# Patient Record
Sex: Male | Born: 1945
Health system: Southern US, Community
[De-identification: ages and names within clinical notes are randomized; demographics above are authoritative.]

## PROBLEM LIST (undated history)

## (undated) DIAGNOSIS — M1990 Unspecified osteoarthritis, unspecified site: Secondary | ICD-10-CM

## (undated) DIAGNOSIS — Z87442 Personal history of urinary calculi: Secondary | ICD-10-CM

## (undated) DIAGNOSIS — N200 Calculus of kidney: Secondary | ICD-10-CM

## (undated) DIAGNOSIS — K219 Gastro-esophageal reflux disease without esophagitis: Secondary | ICD-10-CM

## (undated) DIAGNOSIS — Z860100 Personal history of colon polyps, unspecified: Secondary | ICD-10-CM

## (undated) DIAGNOSIS — E7800 Pure hypercholesterolemia, unspecified: Secondary | ICD-10-CM

## (undated) DIAGNOSIS — I499 Cardiac arrhythmia, unspecified: Secondary | ICD-10-CM

## (undated) DIAGNOSIS — R011 Cardiac murmur, unspecified: Secondary | ICD-10-CM

## (undated) DIAGNOSIS — I2510 Atherosclerotic heart disease of native coronary artery without angina pectoris: Secondary | ICD-10-CM

## (undated) DIAGNOSIS — F419 Anxiety disorder, unspecified: Secondary | ICD-10-CM

## (undated) DIAGNOSIS — J189 Pneumonia, unspecified organism: Secondary | ICD-10-CM

## (undated) DIAGNOSIS — Z800 Family history of malignant neoplasm of digestive organs: Secondary | ICD-10-CM

## (undated) DIAGNOSIS — C801 Malignant (primary) neoplasm, unspecified: Secondary | ICD-10-CM

## (undated) DIAGNOSIS — N189 Chronic kidney disease, unspecified: Secondary | ICD-10-CM

## (undated) DIAGNOSIS — I350 Nonrheumatic aortic (valve) stenosis: Secondary | ICD-10-CM

## (undated) DIAGNOSIS — I480 Paroxysmal atrial fibrillation: Secondary | ICD-10-CM

## (undated) DIAGNOSIS — E119 Type 2 diabetes mellitus without complications: Secondary | ICD-10-CM

## (undated) DIAGNOSIS — I10 Essential (primary) hypertension: Secondary | ICD-10-CM

## (undated) HISTORY — DX: Nonrheumatic aortic (valve) stenosis: I35.0

## (undated) HISTORY — DX: Family history of malignant neoplasm of digestive organs: Z80.0

## (undated) HISTORY — DX: Atherosclerotic heart disease of native coronary artery without angina pectoris: I25.10

## (undated) HISTORY — DX: Personal history of colonic polyps: Z86.010

## (undated) HISTORY — DX: Paroxysmal atrial fibrillation: I48.0

## (undated) HISTORY — DX: Personal history of colon polyps, unspecified: Z86.0100

## (undated) HISTORY — PX: APPENDECTOMY: SHX54

## (undated) HISTORY — PX: OTHER SURGICAL HISTORY: SHX169

---

## 2001-08-13 ENCOUNTER — Ambulatory Visit (HOSPITAL_COMMUNITY): Admission: RE | Admit: 2001-08-13 | Discharge: 2001-08-13 | Payer: Self-pay | Admitting: Pulmonary Disease

## 2007-10-26 ENCOUNTER — Observation Stay (HOSPITAL_COMMUNITY): Admission: EM | Admit: 2007-10-26 | Discharge: 2007-10-28 | Payer: Self-pay | Admitting: Emergency Medicine

## 2009-08-06 ENCOUNTER — Emergency Department (HOSPITAL_COMMUNITY): Admission: EM | Admit: 2009-08-06 | Discharge: 2009-08-06 | Payer: Self-pay | Admitting: Emergency Medicine

## 2010-03-22 LAB — POCT I-STAT, CHEM 8
BUN: 16 mg/dL (ref 6–23)
Calcium, Ion: 1.24 mmol/L (ref 1.12–1.32)
Chloride: 104 mEq/L (ref 96–112)
Creatinine, Ser: 1.1 mg/dL (ref 0.4–1.5)
Glucose, Bld: 155 mg/dL — ABNORMAL HIGH (ref 70–99)
HCT: 47 % (ref 39.0–52.0)
Hemoglobin: 16 g/dL (ref 13.0–17.0)
Potassium: 4.5 mEq/L (ref 3.5–5.1)
Sodium: 139 mEq/L (ref 135–145)
TCO2: 26 mmol/L (ref 0–100)

## 2010-03-22 LAB — GLUCOSE, CAPILLARY: Glucose-Capillary: 147 mg/dL — ABNORMAL HIGH (ref 70–99)
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
## 2010-05-21 NOTE — Group Therapy Note (Signed)
NAMELYALL, FACIANE                 ACCOUNT NO.:  [id number]   MEDICAL RECORD NO.:  [id number]          PATIENT TYPE:  INP   LOCATION:  A340                          FACILITY:  [facility]   PHYSICIAN:  Angus G. Renard Matter, MD   DATE OF BIRTH:  09-22-1945   DATE OF PROCEDURE:  DATE OF DISCHARGE:                                 PROGRESS NOTE   This patient was admitted with anterior chest pain.  He feels some  better.  His cardiac panel remains essentially normal.  He has been seen  by Cardiology.   OBJECTIVE:  VITAL SIGNS:  Blood pressure 147/75, respirations 18, pulse  68, temperature 97.9.  Cardiac markers remain within normal range HEART:  Regular rhythm.  ABDOMEN:  No palpable organs or masses.  LUNGS:  Clear to P&A.   ASSESSMENT:  The patient was admitted with anterior chest pain as a rule  out of coronary artery disease.  Scheduled for a possible stress test by  Cardiology service.      Angus G. Renard Matter, MD  Electronically Signed     AGM/MEDQ  D:  10/28/2007  T:  10/28/2007  Job:  [id number]

## 2010-05-24 NOTE — Procedures (Signed)
   [name], William [name]                             ACCOUNT NO.:  [id number]   MEDICAL RECORD NO.:  [id number]                   PATIENT TYPE:  OUT   LOCATION:  DFTL                                 FACILITY:  [facility]   PHYSICIAN:  Fredirick Maudlin, M.D.              DATE OF BIRTH:  Jun 02, 1945   DATE OF PROCEDURE:  DATE OF DISCHARGE:                                    STRESS TEST   INDICATIONS FOR PROCEDURE:  Hyperlipidemia and diabetes.   SUBJECTIVE:  This patient is undergoing a graded exercise test because of a  history of hyperlipidemia and diabetes and to rule out ischemic cardiac  disease as a complication of these problems.  There are no contraindications  to graded exercise testing.   DESCRIPTION OF PROCEDURE:  This patient exercised for 8 minutes and 30  seconds reaching and sustaining 10.1 METS.  His maximum recorded heart rate  was 143 which is 86% of his age-predicted maximal heart rate.  His blood  pressure response to exercise was normal.  He had no symptoms during  exercise except for fatigue and shortness of breath, no chest pain.  There  were no electrocardiographic changes suggestive of inducible ischemia.   IMPRESSION:  1. Good exercise tolerance.  2. No evidence of inducible ischemia.  3. Normal blood pressure response to exercise.  4. Shortness of breath and fatigue with exercise, but no chest pain.                                               Fredirick Maudlin, M.D.    ELH/MEDQ  D:  08/13/2001  T:  08/15/2001  Job:  [id number]

## 2010-10-08 LAB — DIFFERENTIAL
Basophils Absolute: 0
Basophils Relative: 0
Eosinophils Absolute: 0.2
Monocytes Absolute: 0.9
Neutro Abs: 6.7
Neutrophils Relative %: 65

## 2010-10-08 LAB — CBC
HCT: 40.7
Hemoglobin: 14.2
MCHC: 34.9
MCV: 92.1
Platelets: 212
RBC: 4.42
RDW: 12.9
WBC: 10.4

## 2010-10-08 LAB — CARDIAC PANEL(CRET KIN+CKTOT+MB+TROPI)
CK, MB: 2.8
CK, MB: 4.3 — ABNORMAL HIGH
Relative Index: 1.5
Relative Index: 1.5
Total CK: 182
Troponin I: 0.02
Troponin I: 0.02

## 2010-10-08 LAB — GLUCOSE, CAPILLARY
Glucose-Capillary: 124 — ABNORMAL HIGH
Glucose-Capillary: 133 — ABNORMAL HIGH
Glucose-Capillary: 96
Glucose-Capillary: 99

## 2010-10-08 LAB — BASIC METABOLIC PANEL
BUN: 11
CO2: 28
Calcium: 10.2
Chloride: 108
Creatinine, Ser: 0.84
GFR calc Af Amer: >60
GFR calc non Af Amer: >60
Glucose, Bld: 133 — ABNORMAL HIGH
Potassium: 3.5
Sodium: 141

## 2010-10-08 LAB — POCT CARDIAC MARKERS
CKMB, poc: 1.3
Troponin i, poc: <0.05

## 2010-10-08 LAB — LIPID PANEL
Cholesterol: 122
HDL: 39 — ABNORMAL LOW

## 2011-06-27 ENCOUNTER — Other Ambulatory Visit (HOSPITAL_COMMUNITY): Payer: Self-pay | Admitting: Internal Medicine

## 2011-06-27 DIAGNOSIS — I10 Essential (primary) hypertension: Secondary | ICD-10-CM

## 2011-06-27 DIAGNOSIS — G8929 Other chronic pain: Secondary | ICD-10-CM

## 2011-06-27 DIAGNOSIS — E119 Type 2 diabetes mellitus without complications: Secondary | ICD-10-CM

## 2011-06-27 DIAGNOSIS — E785 Hyperlipidemia, unspecified: Secondary | ICD-10-CM

## 2011-07-01 ENCOUNTER — Ambulatory Visit (HOSPITAL_COMMUNITY)
Admission: RE | Admit: 2011-07-01 | Discharge: 2011-07-01 | Disposition: A | Discharge status: Home / Self Care | Payer: PRIVATE HEALTH INSURANCE | Source: Ambulatory Visit | Attending: Internal Medicine | Admitting: Internal Medicine

## 2011-07-01 DIAGNOSIS — E785 Hyperlipidemia, unspecified: Secondary | ICD-10-CM

## 2011-07-01 DIAGNOSIS — I10 Essential (primary) hypertension: Secondary | ICD-10-CM | POA: Insufficient documentation

## 2011-07-01 DIAGNOSIS — G8929 Other chronic pain: Secondary | ICD-10-CM | POA: Insufficient documentation

## 2011-07-01 DIAGNOSIS — E119 Type 2 diabetes mellitus without complications: Secondary | ICD-10-CM | POA: Insufficient documentation

## 2011-10-03 DIAGNOSIS — M159 Polyosteoarthritis, unspecified: Secondary | ICD-10-CM | POA: Diagnosis not present

## 2011-10-03 DIAGNOSIS — E1049 Type 1 diabetes mellitus with other diabetic neurological complication: Secondary | ICD-10-CM | POA: Diagnosis not present

## 2011-10-03 DIAGNOSIS — Z23 Encounter for immunization: Secondary | ICD-10-CM | POA: Diagnosis not present

## 2011-10-03 DIAGNOSIS — Z6835 Body mass index (BMI) 35.0-35.9, adult: Secondary | ICD-10-CM | POA: Diagnosis not present

## 2011-10-03 DIAGNOSIS — E785 Hyperlipidemia, unspecified: Secondary | ICD-10-CM | POA: Diagnosis not present

## 2011-10-08 DIAGNOSIS — E119 Type 2 diabetes mellitus without complications: Secondary | ICD-10-CM | POA: Diagnosis not present

## 2011-10-08 DIAGNOSIS — H2510 Age-related nuclear cataract, unspecified eye: Secondary | ICD-10-CM | POA: Diagnosis not present

## 2011-10-23 DIAGNOSIS — E785 Hyperlipidemia, unspecified: Secondary | ICD-10-CM | POA: Diagnosis not present

## 2011-10-23 DIAGNOSIS — Z0000 Encounter for general adult medical examination without abnormal findings: Secondary | ICD-10-CM | POA: Diagnosis not present

## 2011-10-23 DIAGNOSIS — G8929 Other chronic pain: Secondary | ICD-10-CM | POA: Diagnosis not present

## 2011-10-23 DIAGNOSIS — Z6834 Body mass index (BMI) 34.0-34.9, adult: Secondary | ICD-10-CM | POA: Diagnosis not present

## 2011-10-23 DIAGNOSIS — I10 Essential (primary) hypertension: Secondary | ICD-10-CM | POA: Diagnosis not present

## 2012-01-06 DIAGNOSIS — Z79899 Other long term (current) drug therapy: Secondary | ICD-10-CM | POA: Diagnosis not present

## 2012-01-06 DIAGNOSIS — Z125 Encounter for screening for malignant neoplasm of prostate: Secondary | ICD-10-CM | POA: Diagnosis not present

## 2012-01-06 DIAGNOSIS — E1142 Type 2 diabetes mellitus with diabetic polyneuropathy: Secondary | ICD-10-CM | POA: Diagnosis not present

## 2012-01-06 DIAGNOSIS — Z6834 Body mass index (BMI) 34.0-34.9, adult: Secondary | ICD-10-CM | POA: Diagnosis not present

## 2012-01-06 DIAGNOSIS — G8929 Other chronic pain: Secondary | ICD-10-CM | POA: Diagnosis not present

## 2012-01-06 DIAGNOSIS — I10 Essential (primary) hypertension: Secondary | ICD-10-CM | POA: Diagnosis not present

## 2012-01-06 DIAGNOSIS — M159 Polyosteoarthritis, unspecified: Secondary | ICD-10-CM | POA: Diagnosis not present

## 2012-01-06 DIAGNOSIS — E1149 Type 2 diabetes mellitus with other diabetic neurological complication: Secondary | ICD-10-CM | POA: Diagnosis not present

## 2012-04-08 DIAGNOSIS — Z6832 Body mass index (BMI) 32.0-32.9, adult: Secondary | ICD-10-CM | POA: Diagnosis not present

## 2012-04-08 DIAGNOSIS — E1142 Type 2 diabetes mellitus with diabetic polyneuropathy: Secondary | ICD-10-CM | POA: Diagnosis not present

## 2012-04-08 DIAGNOSIS — E1149 Type 2 diabetes mellitus with other diabetic neurological complication: Secondary | ICD-10-CM | POA: Diagnosis not present

## 2012-05-12 DIAGNOSIS — B86 Scabies: Secondary | ICD-10-CM | POA: Diagnosis not present

## 2012-05-12 DIAGNOSIS — I10 Essential (primary) hypertension: Secondary | ICD-10-CM | POA: Diagnosis not present

## 2012-05-12 DIAGNOSIS — Z6832 Body mass index (BMI) 32.0-32.9, adult: Secondary | ICD-10-CM | POA: Diagnosis not present

## 2012-05-20 DIAGNOSIS — B86 Scabies: Secondary | ICD-10-CM | POA: Diagnosis not present

## 2012-07-13 DIAGNOSIS — I10 Essential (primary) hypertension: Secondary | ICD-10-CM | POA: Diagnosis not present

## 2012-07-13 DIAGNOSIS — E1149 Type 2 diabetes mellitus with other diabetic neurological complication: Secondary | ICD-10-CM | POA: Diagnosis not present

## 2012-07-13 DIAGNOSIS — Z6833 Body mass index (BMI) 33.0-33.9, adult: Secondary | ICD-10-CM | POA: Diagnosis not present

## 2012-07-13 DIAGNOSIS — E1142 Type 2 diabetes mellitus with diabetic polyneuropathy: Secondary | ICD-10-CM | POA: Diagnosis not present

## 2012-10-15 DIAGNOSIS — Z23 Encounter for immunization: Secondary | ICD-10-CM | POA: Diagnosis not present

## 2012-10-15 DIAGNOSIS — E1049 Type 1 diabetes mellitus with other diabetic neurological complication: Secondary | ICD-10-CM | POA: Diagnosis not present

## 2012-10-15 DIAGNOSIS — Z6833 Body mass index (BMI) 33.0-33.9, adult: Secondary | ICD-10-CM | POA: Diagnosis not present

## 2012-10-15 DIAGNOSIS — E1142 Type 2 diabetes mellitus with diabetic polyneuropathy: Secondary | ICD-10-CM | POA: Diagnosis not present

## 2012-10-15 DIAGNOSIS — I10 Essential (primary) hypertension: Secondary | ICD-10-CM | POA: Diagnosis not present

## 2012-10-15 DIAGNOSIS — E785 Hyperlipidemia, unspecified: Secondary | ICD-10-CM | POA: Diagnosis not present

## 2013-01-17 DIAGNOSIS — I10 Essential (primary) hypertension: Secondary | ICD-10-CM | POA: Diagnosis not present

## 2013-01-17 DIAGNOSIS — IMO0001 Reserved for inherently not codable concepts without codable children: Secondary | ICD-10-CM | POA: Diagnosis not present

## 2013-01-17 DIAGNOSIS — Z6833 Body mass index (BMI) 33.0-33.9, adult: Secondary | ICD-10-CM | POA: Diagnosis not present

## 2013-01-17 DIAGNOSIS — M159 Polyosteoarthritis, unspecified: Secondary | ICD-10-CM | POA: Diagnosis not present

## 2013-01-17 DIAGNOSIS — I2510 Atherosclerotic heart disease of native coronary artery without angina pectoris: Secondary | ICD-10-CM | POA: Diagnosis not present

## 2013-04-25 DIAGNOSIS — Z125 Encounter for screening for malignant neoplasm of prostate: Secondary | ICD-10-CM | POA: Diagnosis not present

## 2013-04-25 DIAGNOSIS — Z23 Encounter for immunization: Secondary | ICD-10-CM | POA: Diagnosis not present

## 2013-04-25 DIAGNOSIS — E119 Type 2 diabetes mellitus without complications: Secondary | ICD-10-CM | POA: Diagnosis not present

## 2013-04-25 DIAGNOSIS — E1149 Type 2 diabetes mellitus with other diabetic neurological complication: Secondary | ICD-10-CM | POA: Diagnosis not present

## 2013-04-25 DIAGNOSIS — Z79899 Other long term (current) drug therapy: Secondary | ICD-10-CM | POA: Diagnosis not present

## 2013-04-25 DIAGNOSIS — M1990 Unspecified osteoarthritis, unspecified site: Secondary | ICD-10-CM | POA: Diagnosis not present

## 2013-04-25 DIAGNOSIS — Z6833 Body mass index (BMI) 33.0-33.9, adult: Secondary | ICD-10-CM | POA: Diagnosis not present

## 2013-04-25 DIAGNOSIS — E669 Obesity, unspecified: Secondary | ICD-10-CM | POA: Diagnosis not present

## 2013-07-07 DIAGNOSIS — L02619 Cutaneous abscess of unspecified foot: Secondary | ICD-10-CM | POA: Diagnosis not present

## 2013-07-07 DIAGNOSIS — I10 Essential (primary) hypertension: Secondary | ICD-10-CM | POA: Diagnosis not present

## 2013-07-07 DIAGNOSIS — M1990 Unspecified osteoarthritis, unspecified site: Secondary | ICD-10-CM | POA: Diagnosis not present

## 2013-07-07 DIAGNOSIS — Z6833 Body mass index (BMI) 33.0-33.9, adult: Secondary | ICD-10-CM | POA: Diagnosis not present

## 2013-07-25 DIAGNOSIS — E669 Obesity, unspecified: Secondary | ICD-10-CM | POA: Diagnosis not present

## 2013-07-25 DIAGNOSIS — E1149 Type 2 diabetes mellitus with other diabetic neurological complication: Secondary | ICD-10-CM | POA: Diagnosis not present

## 2013-07-25 DIAGNOSIS — Z6833 Body mass index (BMI) 33.0-33.9, adult: Secondary | ICD-10-CM | POA: Diagnosis not present

## 2013-07-25 DIAGNOSIS — I10 Essential (primary) hypertension: Secondary | ICD-10-CM | POA: Diagnosis not present

## 2013-08-09 ENCOUNTER — Inpatient Hospital Stay (HOSPITAL_COMMUNITY)
Admission: EM | Admit: 2013-08-09 | Discharge: 2013-08-11 | DRG: 247 | Disposition: A | Discharge status: Home / Self Care | Payer: Medicare Other | Attending: Cardiovascular Disease | Admitting: Cardiovascular Disease

## 2013-08-09 ENCOUNTER — Emergency Department (HOSPITAL_COMMUNITY): Payer: Medicare Other

## 2013-08-09 ENCOUNTER — Encounter (HOSPITAL_COMMUNITY): Payer: Self-pay | Admitting: Emergency Medicine

## 2013-08-09 DIAGNOSIS — R911 Solitary pulmonary nodule: Secondary | ICD-10-CM | POA: Diagnosis present

## 2013-08-09 DIAGNOSIS — E785 Hyperlipidemia, unspecified: Secondary | ICD-10-CM | POA: Diagnosis present

## 2013-08-09 DIAGNOSIS — I10 Essential (primary) hypertension: Secondary | ICD-10-CM | POA: Diagnosis not present

## 2013-08-09 DIAGNOSIS — R9389 Abnormal findings on diagnostic imaging of other specified body structures: Secondary | ICD-10-CM

## 2013-08-09 DIAGNOSIS — E139 Other specified diabetes mellitus without complications: Secondary | ICD-10-CM | POA: Diagnosis not present

## 2013-08-09 DIAGNOSIS — I2510 Atherosclerotic heart disease of native coronary artery without angina pectoris: Secondary | ICD-10-CM | POA: Diagnosis not present

## 2013-08-09 DIAGNOSIS — D72829 Elevated white blood cell count, unspecified: Secondary | ICD-10-CM | POA: Diagnosis present

## 2013-08-09 DIAGNOSIS — R0789 Other chest pain: Secondary | ICD-10-CM | POA: Diagnosis not present

## 2013-08-09 DIAGNOSIS — Z87891 Personal history of nicotine dependence: Secondary | ICD-10-CM | POA: Diagnosis not present

## 2013-08-09 DIAGNOSIS — E7800 Pure hypercholesterolemia, unspecified: Secondary | ICD-10-CM | POA: Diagnosis present

## 2013-08-09 DIAGNOSIS — Z791 Long term (current) use of non-steroidal anti-inflammatories (NSAID): Secondary | ICD-10-CM

## 2013-08-09 DIAGNOSIS — R079 Chest pain, unspecified: Secondary | ICD-10-CM | POA: Diagnosis present

## 2013-08-09 DIAGNOSIS — E119 Type 2 diabetes mellitus without complications: Secondary | ICD-10-CM | POA: Diagnosis present

## 2013-08-09 DIAGNOSIS — Z8249 Family history of ischemic heart disease and other diseases of the circulatory system: Secondary | ICD-10-CM | POA: Diagnosis not present

## 2013-08-09 DIAGNOSIS — R9439 Abnormal result of other cardiovascular function study: Secondary | ICD-10-CM

## 2013-08-09 DIAGNOSIS — I200 Unstable angina: Secondary | ICD-10-CM | POA: Diagnosis present

## 2013-08-09 DIAGNOSIS — Z7982 Long term (current) use of aspirin: Secondary | ICD-10-CM

## 2013-08-09 DIAGNOSIS — R7989 Other specified abnormal findings of blood chemistry: Secondary | ICD-10-CM

## 2013-08-09 DIAGNOSIS — Z955 Presence of coronary angioplasty implant and graft: Secondary | ICD-10-CM

## 2013-08-09 DIAGNOSIS — E099 Drug or chemical induced diabetes mellitus without complications: Secondary | ICD-10-CM

## 2013-08-09 HISTORY — DX: Essential (primary) hypertension: I10

## 2013-08-09 HISTORY — DX: Type 2 diabetes mellitus without complications: E11.9

## 2013-08-09 HISTORY — DX: Pure hypercholesterolemia, unspecified: E78.00

## 2013-08-09 LAB — BASIC METABOLIC PANEL
Anion gap: 12 (ref 5–15)
BUN: 16 mg/dL (ref 6–23)
CO2: 28 mEq/L (ref 19–32)
Calcium: 11 mg/dL — ABNORMAL HIGH (ref 8.4–10.5)
Chloride: 100 mEq/L (ref 96–112)
Creatinine, Ser: 1.21 mg/dL (ref 0.50–1.35)
GFR calc Af Amer: 70 mL/min — ABNORMAL LOW (ref >90)
GFR, EST NON AFRICAN AMERICAN: 61 mL/min — AB (ref >90)
GLUCOSE: 119 mg/dL — AB (ref 70–99)
POTASSIUM: 3.7 meq/L (ref 3.7–5.3)
Sodium: 140 mEq/L (ref 137–147)

## 2013-08-09 LAB — D-DIMER, QUANTITATIVE (NOT AT ARMC): D DIMER QUANT: 0.78 ug{FEU}/mL — AB (ref 0.00–0.48)

## 2013-08-09 LAB — CBC
HCT: 40.3 % (ref 39.0–52.0)
HEMOGLOBIN: 13.9 g/dL (ref 13.0–17.0)
MCH: 31.7 pg (ref 26.0–34.0)
MCHC: 34.5 g/dL (ref 30.0–36.0)
MCV: 92 fL (ref 78.0–100.0)
Platelets: 172 10*3/uL (ref 150–400)
RBC: 4.38 MIL/uL (ref 4.22–5.81)
RDW: 13 % (ref 11.5–15.5)
WBC: 16.7 10*3/uL — ABNORMAL HIGH (ref 4.0–10.5)

## 2013-08-09 LAB — TROPONIN I

## 2013-08-09 MED ORDER — IOHEXOL 350 MG/ML SOLN
100.0000 mL | Freq: Once | INTRAVENOUS | Status: AC | PRN
  Administered 2013-08-09: 100 mL via INTRAVENOUS

## 2013-08-09 MED ORDER — NITROGLYCERIN 0.4 MG SL SUBL
0.4000 mg | SUBLINGUAL_TABLET | SUBLINGUAL | Status: DC | PRN
  Administered 2013-08-09 (×2): 0.4 mg via SUBLINGUAL
  Filled 2013-08-09: qty 1

## 2013-08-09 NOTE — ED Notes (Signed)
Took a nap today, and when awakened had chest pain and sob.
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
## 2013-08-10 ENCOUNTER — Observation Stay (HOSPITAL_COMMUNITY): Payer: Medicare Other

## 2013-08-10 ENCOUNTER — Other Ambulatory Visit: Payer: Self-pay | Admitting: Cardiovascular Disease

## 2013-08-10 ENCOUNTER — Encounter (HOSPITAL_COMMUNITY): Payer: Self-pay

## 2013-08-10 ENCOUNTER — Encounter (HOSPITAL_COMMUNITY): Payer: Self-pay | Admitting: *Deleted

## 2013-08-10 ENCOUNTER — Encounter (HOSPITAL_COMMUNITY)
Admission: EM | Disposition: A | Discharge status: Home / Self Care | Payer: Medicare Other | Source: Home / Self Care | Attending: Family Medicine

## 2013-08-10 DIAGNOSIS — I2510 Atherosclerotic heart disease of native coronary artery without angina pectoris: Principal | ICD-10-CM

## 2013-08-10 DIAGNOSIS — Z7982 Long term (current) use of aspirin: Secondary | ICD-10-CM | POA: Diagnosis not present

## 2013-08-10 DIAGNOSIS — R079 Chest pain, unspecified: Secondary | ICD-10-CM | POA: Diagnosis not present

## 2013-08-10 DIAGNOSIS — E7800 Pure hypercholesterolemia, unspecified: Secondary | ICD-10-CM | POA: Diagnosis present

## 2013-08-10 DIAGNOSIS — E119 Type 2 diabetes mellitus without complications: Secondary | ICD-10-CM | POA: Diagnosis not present

## 2013-08-10 DIAGNOSIS — Z87891 Personal history of nicotine dependence: Secondary | ICD-10-CM

## 2013-08-10 DIAGNOSIS — E785 Hyperlipidemia, unspecified: Secondary | ICD-10-CM

## 2013-08-10 DIAGNOSIS — I10 Essential (primary) hypertension: Secondary | ICD-10-CM

## 2013-08-10 DIAGNOSIS — Z791 Long term (current) use of non-steroidal anti-inflammatories (NSAID): Secondary | ICD-10-CM | POA: Diagnosis not present

## 2013-08-10 DIAGNOSIS — D72829 Elevated white blood cell count, unspecified: Secondary | ICD-10-CM | POA: Diagnosis present

## 2013-08-10 DIAGNOSIS — R9439 Abnormal result of other cardiovascular function study: Secondary | ICD-10-CM

## 2013-08-10 DIAGNOSIS — R911 Solitary pulmonary nodule: Secondary | ICD-10-CM | POA: Diagnosis not present

## 2013-08-10 DIAGNOSIS — E139 Other specified diabetes mellitus without complications: Secondary | ICD-10-CM

## 2013-08-10 DIAGNOSIS — I200 Unstable angina: Secondary | ICD-10-CM

## 2013-08-10 DIAGNOSIS — R9389 Abnormal findings on diagnostic imaging of other specified body structures: Secondary | ICD-10-CM

## 2013-08-10 DIAGNOSIS — Z8249 Family history of ischemic heart disease and other diseases of the circulatory system: Secondary | ICD-10-CM | POA: Diagnosis not present

## 2013-08-10 DIAGNOSIS — R791 Abnormal coagulation profile: Secondary | ICD-10-CM

## 2013-08-10 HISTORY — PX: LEFT HEART CATHETERIZATION WITH CORONARY ANGIOGRAM: SHX5451

## 2013-08-10 HISTORY — PX: CORONARY STENT PLACEMENT: SHX1402

## 2013-08-10 LAB — CBC
HCT: 41.4 % (ref 39.0–52.0)
Hemoglobin: 14.3 g/dL (ref 13.0–17.0)
MCH: 32 pg (ref 26.0–34.0)
MCHC: 34.5 g/dL (ref 30.0–36.0)
MCV: 92.6 fL (ref 78.0–100.0)
Platelets: 180 10*3/uL (ref 150–400)
RBC: 4.47 MIL/uL (ref 4.22–5.81)
RDW: 13 % (ref 11.5–15.5)
WBC: 12.2 10*3/uL — ABNORMAL HIGH (ref 4.0–10.5)

## 2013-08-10 LAB — GLUCOSE, CAPILLARY
GLUCOSE-CAPILLARY: 151 mg/dL — AB (ref 70–99)
GLUCOSE-CAPILLARY: 244 mg/dL — AB (ref 70–99)
GLUCOSE-CAPILLARY: 142 mg/dL — AB (ref 70–99)
Glucose-Capillary: 160 mg/dL — ABNORMAL HIGH (ref 70–99)
Glucose-Capillary: 216 mg/dL — ABNORMAL HIGH (ref 70–99)
Glucose-Capillary: 336 mg/dL — ABNORMAL HIGH (ref 70–99)

## 2013-08-10 LAB — COMPREHENSIVE METABOLIC PANEL
ALBUMIN: 4.1 g/dL (ref 3.5–5.2)
ALT: 28 U/L (ref 0–53)
ANION GAP: 12 (ref 5–15)
AST: 22 U/L (ref 0–37)
Alkaline Phosphatase: 39 U/L (ref 39–117)
BUN: 15 mg/dL (ref 6–23)
CALCIUM: 11.4 mg/dL — AB (ref 8.4–10.5)
CO2: 29 mEq/L (ref 19–32)
CREATININE: 1.13 mg/dL (ref 0.50–1.35)
Chloride: 98 mEq/L (ref 96–112)
GFR calc Af Amer: 76 mL/min — ABNORMAL LOW (ref >90)
GFR, EST NON AFRICAN AMERICAN: 66 mL/min — AB (ref >90)
Glucose, Bld: 142 mg/dL — ABNORMAL HIGH (ref 70–99)
Potassium: 4 mEq/L (ref 3.7–5.3)
Sodium: 139 mEq/L (ref 137–147)
Total Bilirubin: 0.6 mg/dL (ref 0.3–1.2)
Total Protein: 7.8 g/dL (ref 6.0–8.3)

## 2013-08-10 LAB — TROPONIN I: Troponin I: <0.3 ng/mL (ref <0.30)

## 2013-08-10 LAB — PROTIME-INR
INR: 1.1 (ref 0.00–1.49)
PROTHROMBIN TIME: 14.2 s (ref 11.6–15.2)

## 2013-08-10 LAB — POCT ACTIVATED CLOTTING TIME: Activated Clotting Time: 535 seconds

## 2013-08-10 SURGERY — LEFT HEART CATHETERIZATION WITH CORONARY ANGIOGRAM
Anesthesia: LOCAL

## 2013-08-10 MED ORDER — ACETAMINOPHEN 325 MG PO TABS: 650.0000 mg | ORAL_TABLET | ORAL | Status: DC | PRN

## 2013-08-10 MED ORDER — TECHNETIUM TC 99M SESTAMIBI GENERIC - CARDIOLITE
10.0000 | Freq: Once | INTRAVENOUS | Status: AC | PRN
  Administered 2013-08-10: 10 via INTRAVENOUS

## 2013-08-10 MED ORDER — TECHNETIUM TC 99M SESTAMIBI - CARDIOLITE
30.0000 | Freq: Once | INTRAVENOUS | Status: AC | PRN
  Administered 2013-08-10: 30 via INTRAVENOUS

## 2013-08-10 MED ORDER — SODIUM CHLORIDE 0.9 % IJ SOLN: 3.0000 mL | Freq: Two times a day (BID) | INTRAMUSCULAR | Status: DC

## 2013-08-10 MED ORDER — SODIUM CHLORIDE 0.9 % IJ SOLN: 3.0000 mL | INTRAMUSCULAR | Status: DC | PRN

## 2013-08-10 MED ORDER — SODIUM CHLORIDE 0.9 % IV SOLN: 250.0000 mL | INTRAVENOUS | Status: DC | PRN

## 2013-08-10 MED ORDER — ASPIRIN 81 MG PO CHEW: 81.0000 mg | CHEWABLE_TABLET | ORAL | Status: DC

## 2013-08-10 MED ORDER — NITROGLYCERIN 1 MG/10 ML FOR IR/CATH LAB
INTRA_ARTERIAL | Status: AC
  Filled 2013-08-10: qty 10

## 2013-08-10 MED ORDER — LISINOPRIL 40 MG PO TABS
40.0000 mg | ORAL_TABLET | Freq: Two times a day (BID) | ORAL | Status: DC
  Administered 2013-08-10 – 2013-08-11 (×4): 40 mg via ORAL
  Filled 2013-08-10 (×3): qty 1
  Filled 2013-08-10 (×2): qty 4

## 2013-08-10 MED ORDER — MIDAZOLAM HCL 2 MG/2ML IJ SOLN
INTRAMUSCULAR | Status: AC
  Filled 2013-08-10: qty 2

## 2013-08-10 MED ORDER — SODIUM CHLORIDE 0.9 % IJ SOLN
3.0000 mL | INTRAMUSCULAR | Status: DC | PRN
  Administered 2013-08-10: 10 mL via INTRAVENOUS

## 2013-08-10 MED ORDER — HYDROCHLOROTHIAZIDE 25 MG PO TABS
25.0000 mg | ORAL_TABLET | Freq: Every morning | ORAL | Status: DC
  Administered 2013-08-10 – 2013-08-11 (×2): 25 mg via ORAL
  Filled 2013-08-10 (×2): qty 1

## 2013-08-10 MED ORDER — ENOXAPARIN SODIUM 40 MG/0.4ML ~~LOC~~ SOLN
40.0000 mg | SUBCUTANEOUS | Status: DC
  Filled 2013-08-10: qty 0.4

## 2013-08-10 MED ORDER — TAMSULOSIN HCL 0.4 MG PO CAPS
0.4000 mg | ORAL_CAPSULE | Freq: Every day | ORAL | Status: DC
  Administered 2013-08-10 – 2013-08-11 (×2): 0.4 mg via ORAL
  Filled 2013-08-10 (×2): qty 1

## 2013-08-10 MED ORDER — ASPIRIN 81 MG PO CHEW
CHEWABLE_TABLET | ORAL | Status: AC
  Filled 2013-08-10: qty 1

## 2013-08-10 MED ORDER — SODIUM CHLORIDE 0.9 % IV SOLN
1.7500 mg/kg/h | INTRAVENOUS | Status: DC
  Filled 2013-08-10: qty 250

## 2013-08-10 MED ORDER — SODIUM CHLORIDE 0.9 % IV SOLN: INTRAVENOUS | Status: AC

## 2013-08-10 MED ORDER — CLOPIDOGREL BISULFATE 300 MG PO TABS
ORAL_TABLET | ORAL | Status: AC
  Filled 2013-08-10: qty 1

## 2013-08-10 MED ORDER — FENTANYL CITRATE 0.05 MG/ML IJ SOLN
INTRAMUSCULAR | Status: AC
  Filled 2013-08-10: qty 2

## 2013-08-10 MED ORDER — SODIUM CHLORIDE 0.9 % IV SOLN
INTRAVENOUS | Status: DC
  Administered 2013-08-10: 02:00:00 via INTRAVENOUS

## 2013-08-10 MED ORDER — HEPARIN SODIUM (PORCINE) 1000 UNIT/ML IJ SOLN
INTRAMUSCULAR | Status: AC
  Filled 2013-08-10: qty 1

## 2013-08-10 MED ORDER — SODIUM CHLORIDE 0.9 % IV SOLN: 1.0000 mL/kg/h | INTRAVENOUS | Status: DC

## 2013-08-10 MED ORDER — SODIUM CHLORIDE 0.9 % IJ SOLN
3.0000 mL | Freq: Two times a day (BID) | INTRAMUSCULAR | Status: DC
  Administered 2013-08-10 (×2): 3 mL via INTRAVENOUS

## 2013-08-10 MED ORDER — LIDOCAINE HCL (PF) 1 % IJ SOLN
INTRAMUSCULAR | Status: AC
  Filled 2013-08-10: qty 30

## 2013-08-10 MED ORDER — INSULIN ASPART 100 UNIT/ML ~~LOC~~ SOLN
0.0000 [IU] | Freq: Every day | SUBCUTANEOUS | Status: DC
  Administered 2013-08-10: 23:00:00 4 [IU] via SUBCUTANEOUS

## 2013-08-10 MED ORDER — HYDROCODONE-ACETAMINOPHEN 10-325 MG PO TABS
1.0000 | ORAL_TABLET | ORAL | Status: DC | PRN
  Administered 2013-08-10 (×2): 1 via ORAL
  Filled 2013-08-10 (×3): qty 1

## 2013-08-10 MED ORDER — HEPARIN (PORCINE) IN NACL 2-0.9 UNIT/ML-% IJ SOLN
INTRAMUSCULAR | Status: AC
  Filled 2013-08-10: qty 1000

## 2013-08-10 MED ORDER — ALPRAZOLAM 0.5 MG PO TABS
1.0000 mg | ORAL_TABLET | Freq: Three times a day (TID) | ORAL | Status: DC | PRN
  Administered 2013-08-10: 1 mg via ORAL
  Filled 2013-08-10: qty 2
  Filled 2013-08-10: qty 1

## 2013-08-10 MED ORDER — INSULIN GLARGINE 100 UNIT/ML ~~LOC~~ SOLN
50.0000 [IU] | Freq: Every day | SUBCUTANEOUS | Status: DC
  Administered 2013-08-10: 22:00:00 50 [IU] via SUBCUTANEOUS
  Filled 2013-08-10 (×3): qty 0.5

## 2013-08-10 MED ORDER — ASPIRIN EC 81 MG PO TBEC
81.0000 mg | DELAYED_RELEASE_TABLET | Freq: Every morning | ORAL | Status: DC
  Administered 2013-08-11: 10:00:00 81 mg via ORAL
  Filled 2013-08-10: qty 1

## 2013-08-10 MED ORDER — VERAPAMIL HCL 2.5 MG/ML IV SOLN
INTRAVENOUS | Status: AC
  Filled 2013-08-10: qty 2

## 2013-08-10 MED ORDER — INSULIN ASPART 100 UNIT/ML ~~LOC~~ SOLN
0.0000 [IU] | Freq: Three times a day (TID) | SUBCUTANEOUS | Status: DC
  Administered 2013-08-11 (×2): 2 [IU] via SUBCUTANEOUS

## 2013-08-10 MED ORDER — SODIUM CHLORIDE 0.9 % IJ SOLN
INTRAMUSCULAR | Status: AC
  Administered 2013-08-10: 10 mL via INTRAVENOUS
  Filled 2013-08-10: qty 10

## 2013-08-10 MED ORDER — ATORVASTATIN CALCIUM 80 MG PO TABS
80.0000 mg | ORAL_TABLET | Freq: Every day | ORAL | Status: DC
  Filled 2013-08-10: qty 1

## 2013-08-10 MED ORDER — HEPARIN (PORCINE) IN NACL 2-0.9 UNIT/ML-% IJ SOLN
INTRAMUSCULAR | Status: AC
  Filled 2013-08-10: qty 500

## 2013-08-10 MED ORDER — REGADENOSON 0.4 MG/5ML IV SOLN
INTRAVENOUS | Status: AC
  Administered 2013-08-10: 0.4 mg via INTRAVENOUS
  Filled 2013-08-10: qty 5

## 2013-08-10 MED ORDER — REGADENOSON 0.4 MG/5ML IV SOLN
0.4000 mg | Freq: Once | INTRAVENOUS | Status: AC
  Administered 2013-08-10: 0.4 mg via INTRAVENOUS

## 2013-08-10 MED ORDER — CLOPIDOGREL BISULFATE 75 MG PO TABS
75.0000 mg | ORAL_TABLET | Freq: Every day | ORAL | Status: DC
  Administered 2013-08-11: 75 mg via ORAL
  Filled 2013-08-10: qty 1

## 2013-08-10 MED ORDER — INSULIN ASPART 100 UNIT/ML ~~LOC~~ SOLN
0.0000 [IU] | Freq: Three times a day (TID) | SUBCUTANEOUS | Status: DC
  Administered 2013-08-10: 3 [IU] via SUBCUTANEOUS

## 2013-08-10 MED ORDER — BIVALIRUDIN 250 MG IV SOLR
INTRAVENOUS | Status: AC
  Filled 2013-08-10: qty 250

## 2013-08-10 NOTE — Progress Notes (Signed)
Pt seen by cardiology & taken for stress test.  D/W Cardiology & early results concerning for underlying ischemia.  Pt to be transferred to [name] for cardiac catheterization & Cardiology to assume attending service.

## 2013-08-10 NOTE — Progress Notes (Signed)
Angiomax infusing at 0.25mg /kg/hr on arrival to holding
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
## 2013-08-10 NOTE — CV Procedure (Signed)
Cardiac Catheterization Procedure Note  Name: William [name] MRN: [id number] DOB: [birth year]  Procedure: Left Heart Cath, Selective Coronary Angiography,  PTCA and stenting of the distal right coronary artery  Indication: Unstable angina with abnormal stress test which showed significant inferior ischemia. Ejection fraction was normal  Medications:  Sedation:  1 mg IV Versed, 25 mcg IV Fentanyl  Contrast:  80 ml Omnipaque  Procedural Details: The right wrist was prepped, draped, and anesthetized with 1% lidocaine. Using the modified Seldinger technique, a 5 French Slender sheath was introduced into the right radial artery. 3 mg of verapamil was administered through the sheath, weight-based unfractionated heparin was administered intravenously. A Jackie catheter was used for selective coronary angiography. Catheter exchanges were performed over an exchange length guidewire. There were no immediate procedural complications.  Procedural Findings:  Hemodynamics: AO:  99/55   mmHg LV:  105/5    mmHg LVEDP: 9  mmHg  Coronary angiography: Coronary dominance: Right   Left Main:  Normal  Left Anterior Descending (LAD):  Normal in size with mild calcifications proximally. There is a 40% tubular stenosis in the midsegment. The rest of the vessel has no significant disease.  1st diagonal (D1):  Large in size with minor irregularities.  2nd diagonal (D2):  Very small in size.  3rd diagonal (D3):  Very small in size.  Circumflex (LCx):  Normal in size and nondominant. There is a 40% stenosis in the midsegment. There is 30% tubular stenosis in the distal segment. The vessel is overall mildly calcified.   1st obtuse marginal:  Small in size with minor irregularities.   2nd obtuse marginal:  Normal in size with 20% proximal stenosis.   3rd obtuse marginal:  Minor irregularities.   Right Coronary Artery: Large in size and dominant. The vessel is mildly calcified. There is 20% proximal  stenosis and 20% mid stenosis. There is a 99% hazy stenosis in the distal segment just before the bifurcation of the PDA/ PLV   Posterior descending artery: Normal in size with minor irregularities.   Posterior AV segment: Medium in size with minor irregularities.   Posterolateral branchs:  2 small posterolateral branches which are free of significant disease.  Left ventriculography:  was not performed EF was normal by nuclear stress test.   PCI Note:  Following the diagnostic procedure, the decision was made to proceed with PCI.  Weight-based bivalirudin was given for anticoagulation. Once a therapeutic ACT was achieved, a 6 [place] JR 4  guide catheter was inserted.  A Runthrough  coronary guidewire was used to cross the lesion.  The lesion was predilated with a 2.5 x 8  balloon.  The lesion was then stented with a 2.75 x 12 mm  Xience Albine drug-eluting  stent Which was deployed to 16 atmospheres.    Following PCI, there was 0% residual stenosis and TIMI-3 flow. Final angiography confirmed an excellent result. The patient tolerated the procedure well. There were no immediate procedural complications. A TR band was used for radial hemostasis. The patient was transferred to the post catheterization recovery area for further monitoring.  PCI Data: Vessel - RCA /Segment - distal  Percent Stenosis (pre)  99%  TIMI-flow 3  Stent : 2.75 x 12 mm  Xience Albine drug-eluting  stent Percent Stenosis (post) 0%  TIMI-flow (post) 3   Final Conclusions:  1. Severe one-vessel coronary artery disease. 2. Normal LV systolic function by noninvasive imaging. Normal LV EDP. 3. Successful angioplasty and drug-eluting stent placement to  the distal right coronary artery.  Recommendations:  Dual antiplatelet therapy for at least one year. Aggressive treatment of risk factors is recommended. The patient has mild to moderate residual disease in the LAD/left circumflex.   Kathlyn Sacramento MD, [facility] 08/10/2013, 3:54  PM

## 2013-08-10 NOTE — H&P (View-Only) (Signed)
Pt seen by cardiology & taken for stress test.  D/W Cardiology & early results concerning for underlying ischemia.  Pt to be transferred to [facility] for cardiac catheterization & Cardiology to assume attending service.

## 2013-08-10 NOTE — Interval H&P Note (Signed)
Cath Lab Visit (complete for each Cath Lab visit)  Clinical Evaluation Leading to the Procedure:   ACS: No.  Non-ACS:    Anginal Classification: CCS III  Anti-ischemic medical therapy: Minimal Therapy (1 class of medications)  Non-Invasive Test Results: High-risk stress test findings: cardiac mortality >3%/year  Prior CABG: No previous CABG      History and Physical Interval Note:  08/10/2013 3:08 PM  William [name]  has presented today for surgery, with the diagnosis of Abnormal Stress Test  The various methods of treatment have been discussed with the patient and family. After consideration of risks, benefits and other options for treatment, the patient has consented to  Procedure(s): LEFT HEART CATHETERIZATION WITH CORONARY ANGIOGRAM (N/A) as a surgical intervention .  The patient's history has been reviewed, patient examined, no change in status, stable for surgery.  I have reviewed the patient's chart and labs.  Questions were answered to the patient's satisfaction.     Kathlyn Sacramento

## 2013-08-10 NOTE — Progress Notes (Signed)
Stress Lab Nurses Notes - [place] Na  LYAN MOYANO 08/10/2013 Reason for doing test: Chest Pain and HTN Type of test: Wille Glaser / Inpatient Rm 333 Nurse performing test: Gerrit Halls, RN Nuclear Medicine Tech: Redmond Baseman Echo Tech: Not Applicable MD performing test: Koneswaran/K.Purcell Nails NP Family MD: Gerarda Fraction Test explained and consent signed: Yes.   IV started: IV in progress from floor and No redness or edema Symptoms: none Treatment/Intervention: None Reason test stopped: protocol completed After recovery IV was: No redness or edema and IV in progress @ 75/hr Patient to return to [place]. Med at : 10:15 Patient discharged: Transported back to room 333 via wc Patient's Condition upon discharge was: stable Comments: During test BP 102/58 & HR 90.  Recovery BP 93/59 & HR 80.  Symptoms resolved in recovery. Geanie Cooley T

## 2013-08-10 NOTE — ED Provider Notes (Signed)
Patient left with me at change of shift to get results of CT angiography of chest. Dr. Karsten Ro felt patient had enough risk factors that even if his CT scan was normal he should be admitted for further cardiac evaluation.  00:05 Dr Darrick Meigs, admit to obs, team 1  Ct Angio Chest W/cm &/or Wo Cm  08/09/2013   CLINICAL DATA:  Chest pain and elevated D-dimer.  EXAM: CT ANGIOGRAPHY CHEST WITH CONTRAST  TECHNIQUE: Multidetector CT imaging of the chest was performed using the standard protocol during bolus administration of intravenous contrast. Multiplanar CT image reconstructions and MIPs were obtained to evaluate the vascular anatomy.  CONTRAST:  169mL OMNIPAQUE IOHEXOL 350 MG/ML SOLN  COMPARISON:  None.  FINDINGS: THORACIC INLET/BODY WALL:  No acute abnormality.  MEDIASTINUM:  Normal heart size. No pericardial effusion. Multi focal coronary atherosclerosis. Prominent upper esophagus without definitive wall thickening. Intermittent respiratory motion which decreases sensitivity. Negative for pulmonary embolism or aortic dissection.  LUNG WINDOWS:  Negative for pneumonia. No edema, effusion, or pneumothorax. 4 mm pulmonary nodule in the right middle lobe (image 44). Calcified granuloma along the upper right major fissure.  UPPER ABDOMEN:  No acute findings.  OSSEOUS:  No acute fracture.  No suspicious lytic or blastic lesions.  IMPRESSION: 1. Negative for pulmonary embolism or other acute intrathoracic abnormality. 2. Diffuse coronary atherosclerosis. 3. 4 mm pulmonary nodule in the right middle lobe. Giving smoking history, follow-up chest CT at 1 year is recommended.   Electronically Signed   By: Jorje Guild M.D.   On: 08/09/2013 23:48   Diagnoses that have been ruled out:  None  Diagnoses that are still under consideration:  None  Final diagnoses:  Chest pain, unspecified chest pain type    Plan admission   Rolland Porter, MD, Alanson Aly, MD 08/10/13 [phone]

## 2013-08-10 NOTE — Plan of Care (Signed)
Problem: Consults Goal: Tobacco Cessation referral if indicated Outcome: Not Applicable Date Met:  84/21/03 Patient states he quit smoking over 8 years ago.

## 2013-08-10 NOTE — Progress Notes (Signed)
Ate turkey sandwich 
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
## 2013-08-10 NOTE — Plan of Care (Signed)
Pt being transferred to [facility] to Cath Lab and poss then to floor.  Pt would be going to 2W18C.  Report called to Obie Dredge, RN and [facility] has been called to transport pt.

## 2013-08-10 NOTE — Progress Notes (Signed)
UR completed 

## 2013-08-10 NOTE — Consult Note (Addendum)
The patient was seen and examined, and I agree with the assessment and plan as documented above, with modifications as noted below. Pt with multiple cardiovascular risk factors admitted with both exertional and nonexertional chest pain. Ruled out for ACS with serial negative troponins. ECG normal. Elevated D-dimer led to CT angiogram of chest which demonstrated diffuse coronary atherosclerosis. Currently free of symptoms. Will proceed with nuclear stress test for further clarification.  ADDENDUM: Nuclear stress test demonstrated a large region of ischemia in the inferior wall extending from the apex to the base, with hypokinesis in this region. Calculated LVEF 60%. Currently on ASA, Lipitor, and ACEI, and has received Lovenox. Will make arrangements for transfer to [facility] for coronary angiography.

## 2013-08-11 DIAGNOSIS — R9439 Abnormal result of other cardiovascular function study: Secondary | ICD-10-CM

## 2013-08-11 LAB — BASIC METABOLIC PANEL
Anion gap: 12 (ref 5–15)
BUN: 18 mg/dL (ref 6–23)
CO2: 27 mEq/L (ref 19–32)
Calcium: 9.7 mg/dL (ref 8.4–10.5)
Chloride: 103 mEq/L (ref 96–112)
Creatinine, Ser: 1.11 mg/dL (ref 0.50–1.35)
GFR calc Af Amer: 78 mL/min — ABNORMAL LOW (ref >90)
GFR calc non Af Amer: 67 mL/min — ABNORMAL LOW (ref >90)
GLUCOSE: 141 mg/dL — AB (ref 70–99)
POTASSIUM: 3.8 meq/L (ref 3.7–5.3)
Sodium: 142 mEq/L (ref 137–147)

## 2013-08-11 LAB — GLUCOSE, CAPILLARY
Glucose-Capillary: 152 mg/dL — ABNORMAL HIGH (ref 70–99)
Glucose-Capillary: 175 mg/dL — ABNORMAL HIGH (ref 70–99)

## 2013-08-11 LAB — CBC
HCT: 38.3 % — ABNORMAL LOW (ref 39.0–52.0)
Hemoglobin: 12.8 g/dL — ABNORMAL LOW (ref 13.0–17.0)
MCH: 30.8 pg (ref 26.0–34.0)
MCHC: 33.4 g/dL (ref 30.0–36.0)
MCV: 92.3 fL (ref 78.0–100.0)
Platelets: 173 10*3/uL (ref 150–400)
RBC: 4.15 MIL/uL — AB (ref 4.22–5.81)
RDW: 13.1 % (ref 11.5–15.5)
WBC: 9.2 10*3/uL (ref 4.0–10.5)

## 2013-08-11 MED ORDER — CLOPIDOGREL BISULFATE 75 MG PO TABS: 75.0000 mg | ORAL_TABLET | Freq: Every day | ORAL | Status: DC

## 2013-08-11 MED ORDER — NITROGLYCERIN 0.4 MG SL SUBL: 0.4000 mg | SUBLINGUAL_TABLET | SUBLINGUAL | Status: DC | PRN

## 2013-08-11 MED FILL — Sodium Chloride IV Soln 0.9%: INTRAVENOUS | Qty: 50 | Status: AC

## 2013-08-11 NOTE — Discharge Summary (Signed)
Physician Discharge Summary     Patient ID: William [name] MRN: [id number] DOB/AGE: 01/31/1945 68 y.o.  Admit date: 08/09/2013 Discharge date: 08/15/2013  Admission Diagnoses: Chest Pain  Discharge Diagnoses:  Active Problems:   Chest pain   Hyperlipidemia   DM (diabetes mellitus)   HTN (hypertension)   Solitary pulmonary nodule   Discharged Condition: stable  Hospital Course:   William [name] is a 68 y.o.male with known history of diabetes, hypertension, hypercholesterolemia, former tobacco abuse (quit 8 yrs ago), with no prior cardiac history, admitted with chest pain. The patient began symptoms 3 weeks ago, occuring with exertion while doing yard work. He states he felt "heartburn" with substernal chest pain, while mowing the yard. He thought it was related to the heat.   This occurred each time he worked outside. He felt more tired, and stated he didn't feel like doing much, so he stayed inside. Took naps. The day of admission, he awoke from a nap, to answer phone call from granddaughter. During call, he feel a "grippping" substernal pain, with indigestion. Granddaughter came over and convinced him to come to ER after pain did not subside after 3 hours. He states he took Tums for relief, but only helped a little.   In ER, BP 137/67. HR 70, O2 97%, afebrile. Leukocytosis noted with WBC 16.7, Glucose 119, troponin <0.30. D-Dimer elevated at 0.78 with negative CT for PE. However, CT demonstrated "diffuse coronary atherosclerosis" with 3.4 mm pulmonary nodule in the right middle lobe. EKG demonstrated NSR without ACS. He was treated with NTG SL X2 With completed relief of symptoms. He has had no recurrence of chest pain since admission.   He states that he was seen by [facility] approx 5-6 years ago and had stress test, and maybe an echocardiogram, which he states was negative. These tests were completed due to uncontrolled hypertension. He was released back to Dr. Gerarda Fraction and has not had any further  cardiac testing since that time.   The patient underwent lexiscan stress test which revealed a Large region of ischemia seen in the inferior wall extending from the apex to base.  He was transferred to [facility] and underwent left heart cath which revealed severe one-vessel coronary artery disease and normal LV systolic function.  Normal LV EDP. He underwent successful angioplasty and drug-eluting stent placement to the distal right coronary artery. Mild to moderate residual disease in the LAD/left circumflex.  He was started on ASA and plavix.  Will need to reconsider beta blocker at follow up office visit.  The patient was seen by Dr. Ellyn Hack who felt he was stable for DC home.   Consults: None  Significant Diagnostic Studies:   Cardiac Catheterization Procedure Note  Name: William [name]  MRN: [id number]  DOB: 06/02/1945  Procedure: Left Heart Cath, Selective Coronary Angiography, PTCA and stenting of the distal right coronary artery  Indication: Unstable angina with abnormal stress test which showed significant inferior ischemia. Ejection fraction was normal  Medications:  Sedation: 1 mg IV Versed, 25 mcg IV Fentanyl  Contrast: 80 ml Omnipaque  Procedural Details: The right wrist was prepped, draped, and anesthetized with 1% lidocaine. Using the modified Seldinger technique, a 5 French Slender sheath was introduced into the right radial artery. 3 mg of verapamil was administered through the sheath, weight-based unfractionated heparin was administered intravenously. A Jackie catheter was used for selective coronary angiography. Catheter exchanges were performed over an exchange length guidewire. There were no immediate procedural complications.  Procedural  Findings:  Hemodynamics:  AO: 99/55 mmHg  LV: 105/5 mmHg  LVEDP: 9 mmHg  Coronary angiography:  Coronary dominance: Right  Left Main: Normal  Left Anterior Descending (LAD): Normal in size with mild calcifications proximally. There is a  40% tubular stenosis in the midsegment. The rest of the vessel has no significant disease.  1st diagonal (D1): Large in size with minor irregularities.  2nd diagonal (D2): Very small in size.  3rd diagonal (D3): Very small in size.  Circumflex (LCx): Normal in size and nondominant. There is a 40% stenosis in the midsegment. There is 30% tubular stenosis in the distal segment. The vessel is overall mildly calcified.  1st obtuse marginal: Small in size with minor irregularities.  2nd obtuse marginal: Normal in size with 20% proximal stenosis.  3rd obtuse marginal: Minor irregularities.  Right Coronary Artery: Large in size and dominant. The vessel is mildly calcified. There is 20% proximal stenosis and 20% mid stenosis. There is a 99% hazy stenosis in the distal segment just before the bifurcation of the PDA/ PLV  Posterior descending artery: Normal in size with minor irregularities.  Posterior AV segment: Medium in size with minor irregularities.  Posterolateral branchs: 2 small posterolateral branches which are free of significant disease. Left ventriculography: was not performed EF was normal by nuclear stress test.  PCI Note: Following the diagnostic procedure, the decision was made to proceed with PCI. Weight-based bivalirudin was given for anticoagulation. Once a therapeutic ACT was achieved, a 6 [place] JR 4 guide catheter was inserted. A Runthrough coronary guidewire was used to cross the lesion. The lesion was predilated with a 2.5 x 8 balloon. The lesion was then stented with a 2.75 x 12 mm Xience Albine drug-eluting stent Which was deployed to 16 atmospheres. Following PCI, there was 0% residual stenosis and TIMI-3 flow. Final angiography confirmed an excellent result. The patient tolerated the procedure well. There were no immediate procedural complications. A TR band was used for radial hemostasis. The patient was transferred to the post catheterization recovery area for further monitoring.    PCI Data:  Vessel - RCA /Segment - distal  Percent Stenosis (pre) 99%  TIMI-flow 3  Stent : 2.75 x 12 mm Xience Albine drug-eluting stent  Percent Stenosis (post) 0%  TIMI-flow (post) 3  Final Conclusions:  1. Severe one-vessel coronary artery disease.  2. Normal LV systolic function by noninvasive imaging. Normal LV EDP.  3. Successful angioplasty and drug-eluting stent placement to the distal right coronary artery.  Recommendations:  Dual antiplatelet therapy for at least one year. Aggressive treatment of risk factors is recommended. The patient has mild to moderate residual disease in the LAD/left circumflex.  Kathlyn Sacramento MD, [facility]  08/10/2013, 3:54 PM  MYOCARDIAL IMAGING WITH SPECT (REST AND PHARMACOLOGIC-STRESS)  GATED LEFT VENTRICULAR WALL MOTION STUDY  LEFT VENTRICULAR EJECTION FRACTION  TECHNIQUE:  Standard myocardial SPECT imaging was performed after resting  intravenous injection of 10 mCi Tc-17m sestamibi. Subsequently,  intravenous infusion of Lexiscan was performed under the supervision  of the Cardiology staff. At peak effect of the drug, 30 mCi Tc-27m  sestamibi was injected intravenously and standard myocardial SPECT  imaging was performed. Quantitative gated imaging was also performed  to evaluate left ventricular wall motion, and estimate left  ventricular ejection fraction.  COMPARISON: None.  FINDINGS:  The patient was stressed according to the Lexiscan protocol. The  heart rate ranged from 60 to 90 beats per min. The blood pressure  range from 95/56, up  to 102/58. No chest pain was reported. Resting  ECG demonstrated normal sinus rhythm. With stress, there were no  diagnostic ST segment or T-wave abnormalities, nor any arrhythmias.  Analysis of the raw data showed no significant extracardiac  radiotracer uptake. Analysis of the profusion images demonstrated a  moderate to large size area with moderate to severely decreased  uptake in the inferior wall  extending from the apex to the base with  complete reversibility seen on resting images. Left ventricular  systolic function is normal, calculated LVEF 60%. The aforementioned  walls are hypokinetic.  IMPRESSION:  1. Abnormal Lexiscan Cardiolite stress test.  2. Large region of ischemia seen in the inferior wall extending from  the apex to base.  3. Normal LV systolic function, calculated LVEF 60%. The  aforementioned region is hypokinetic.  Treatments: See above  Discharge Exam: Blood pressure 160/73, pulse 85, temperature 97.7 F (36.5 C), temperature source Oral, resp. rate 20, height 5' 9.5" (1.765 m), weight 242 lb 8.1 oz (110 kg), SpO2 98.00%.   Disposition:       Discharge Instructions   Amb Referral to Cardiac Rehabilitation    Complete by:  As directed      Diet - low sodium heart healthy    Complete by:  As directed      Discharge instructions    Complete by:  As directed   No lifting or driving for three days.     Increase activity slowly    Complete by:  As directed             Medication List         ALPRAZolam 1 MG tablet  Commonly known as:  XANAX  Take 1 mg by mouth 4 (four) times daily as needed. For anxiety     aspirin EC 81 MG tablet  Take 81 mg by mouth every morning.     clopidogrel 75 MG tablet  Commonly known as:  PLAVIX  Take 1 tablet (75 mg total) by mouth daily with breakfast.     CRESTOR 40 MG tablet  Generic drug:  rosuvastatin  Take 40 mg by mouth at bedtime.     glipiZIDE 10 MG tablet  Commonly known as:  GLUCOTROL  Take 10 mg by mouth 2 (two) times daily.     hydrochlorothiazide 25 MG tablet  Commonly known as:  HYDRODIURIL  Take 25 mg by mouth every morning.     HYDROcodone-acetaminophen 10-325 MG per tablet  Commonly known as:  NORCO  Take 1 tablet by mouth every 4 (four) hours as needed. For pain     JANUMET 50-1000 MG per tablet  Generic drug:  sitaGLIPtin-metformin  Take 1 tablet by mouth 2 (two) times daily.       LANTUS SOLOSTAR 100 UNIT/ML Solostar Pen  Generic drug:  Insulin Glargine  Inject 50 Units into the skin at bedtime. Patient checks Blood sugar levels twice daily and bases his dose from that reading     lisinopril 40 MG tablet  Commonly known as:  PRINIVIL,ZESTRIL  Take 40 mg by mouth 2 (two) times daily.     nitroGLYCERIN 0.4 MG SL tablet  Commonly known as:  NITROSTAT  Place 1 tablet (0.4 mg total) under the tongue every 5 (five) minutes as needed for chest pain.     tamsulosin 0.4 MG Caps capsule  Commonly known as:  FLOMAX  Take 1 capsule by mouth daily.       Follow-up Information  Follow up with Ermalinda Barrios, PA-C On 08/24/2013. (11:20 AM)    Specialty:  Cardiology   Contact information:   [address] [place] [address] [phone]      Greater than 30 minutes was spent completing the patient's discharge.   SignedTarri Fuller, PA-C 08/15/2013, 1:33 PM  I saw & evaluated the patient this AM along with Tarri Fuller, PA. I agree with his findings, examination as well as impression recommendations.   S/p PCI to distal RCA with DES - on ASA/Plavix, statin & ACE-I. May have room for BB addition in OP setting.  Doing well today, ambulating without Angina. Ready for d/c from Cardiac standpoint.  ROV with [place].  Leonie Man, MD

## 2013-08-11 NOTE — Care Management Note (Addendum)
  Page 1 of 1   08/11/2013     3:21:10 PM CARE MANAGEMENT NOTE 08/11/2013  Patient:  William [name], William [name]   Account Number:  [id number]  Date Initiated:  08/11/2013  Documentation initiated by:  Wilfred Dayrit  Subjective/Objective Assessment:   CP and abnormal Stress Test     Action/Plan:   CM to follow for disposition needs   Anticipated DC Date:  08/11/2013   Anticipated DC Plan:  HOME/SELF CARE         Choice offered to / List presented to:             Status of service:  Completed, signed off Medicare Important Message given?  NA - LOS <3 / Initial given by admissions (If response is "NO", the following Medicare IM given date fields will be blank) Date Medicare IM given:   Medicare IM given by:   Date Additional Medicare IM given:   Additional Medicare IM given by:    Discharge Disposition:  HOME/SELF CARE  Per UR Regulation:  Reviewed for med. necessity/level of care/duration of stay  If discussed at [place] of Stay Meetings, dates discussed:    Comments:  Shulem Mader RN, BSN, MSHL, CCM  Nurse - Case Manager, (Unit [phone]  08/11/2013 Initial IM 08/09/2013 provided by admissions.

## 2013-08-11 NOTE — Progress Notes (Signed)
[id number] Cardiac Rehab Pt states that he has walked in hall,denies any cp. Completed stent discharge education. He voices understanding. Pt agrees to [place]. CRP in [place], will send referral.

## 2013-08-11 NOTE — Progress Notes (Addendum)
TR BAND REMOVAL  LOCATION:    right radial  DEFLATED PER PROTOCOL:    Yes.    TIME BAND OFF / DRESSING APPLIED:    23:00  SITE UPON ARRIVAL:    Level 0  SITE AFTER BAND REMOVAL:    Level 0  REVERSE ALLEN'S TEST:     positive  CIRCULATION SENSATION AND MOVEMENT:    Within Normal Limits   Yes.    COMMENTS:

## 2013-08-11 NOTE — Progress Notes (Signed)
Subjective: No CP or SOB  Objective: Vital signs in last 24 hours: Temp:  [97.7 F (36.5 C)-98 F (36.7 C)] 97.8 F (36.6 C) (08/06 0018) Pulse Rate:  [59-81] 71 (08/06 0018) Resp:  [14-18] 18 (08/06 0018) BP: (91-140)/(53-76) 124/63 mmHg (08/06 0018) SpO2:  [92 %-97 %] 92 % (08/06 0018) Weight:  [242 lb 8.1 oz (110 kg)] 242 lb 8.1 oz (110 kg) (08/06 0018) Last BM Date: 08/09/13  Intake/Output from previous day: 08/05 0701 - 08/06 0700 In: 476.7 [I.V.:476.7] Out: 250 [Urine:250] Intake/Output this shift: Total I/O In: 476.7 [I.V.:476.7] Out: -   Medications Current Facility-Administered Medications  Medication Dose Route Frequency Provider Last Rate Last Dose  . 0.9 %  sodium chloride infusion  250 mL Intravenous PRN Oswald Hillock, MD      . 0.9 %  sodium chloride infusion   Intravenous Continuous Oswald Hillock, MD      . acetaminophen (TYLENOL) tablet 650 mg  650 mg Oral Q4H PRN [name], MD      . ALPRAZolam Duanne Moron) tablet 1 mg  1 mg Oral TID PRN Oswald Hillock, MD   1 mg at 08/10/13 0229  . aspirin EC tablet 81 mg  81 mg Oral q morning - 10a Oswald Hillock, MD      . atorvastatin (LIPITOR) tablet 80 mg  80 mg Oral q1800 Oswald Hillock, MD      . clopidogrel (PLAVIX) tablet 75 mg  75 mg Oral Q breakfast [name], MD      . hydrochlorothiazide (HYDRODIURIL) tablet 25 mg  25 mg Oral q morning - 10a Oswald Hillock, MD   25 mg at 08/10/13 1140  . HYDROcodone-acetaminophen (NORCO) 10-325 MG per tablet 1 tablet  1 tablet Oral Q4H PRN Oswald Hillock, MD   1 tablet at 08/10/13 1826  . insulin aspart (novoLOG) injection 0-5 Units  0-5 Units Subcutaneous QHS Grafton Folk, MD   4 Units at 08/10/13 2250  . insulin aspart (novoLOG) injection 0-9 Units  0-9 Units Subcutaneous TID WC Oswald Hillock, MD      . insulin glargine (LANTUS) injection 50 Units  50 Units Subcutaneous QHS Oswald Hillock, MD   50 Units at 08/10/13 2210  . lisinopril (PRINIVIL,ZESTRIL) tablet 40 mg  40 mg Oral  BID Oswald Hillock, MD   40 mg at 08/10/13 2210  . nitroGLYCERIN (NITROSTAT) SL tablet 0.4 mg  0.4 mg Sublingual Q5 min PRN Ephraim Hamburger, MD   0.4 mg at 08/09/13 2123  . sodium chloride 0.9 % injection 3 mL  3 mL Intravenous Q12H Oswald Hillock, MD   3 mL at 08/10/13 1142  . sodium chloride 0.9 % injection 3 mL  3 mL Intravenous PRN Oswald Hillock, MD   10 mL at 08/10/13 0949  . tamsulosin (FLOMAX) capsule 0.4 mg  0.4 mg Oral Daily Oswald Hillock, MD   0.4 mg at 08/10/13 1139    PE: General appearance: alert, cooperative and no distress Lungs: clear to auscultation bilaterally Heart: regular rate and rhythm and 1/6 sys MM Extremities: No LEE Pulses: 2+ and symmetric Skin: Warm and dry.  Radial cath site: no ecchymosis.  Neurologic: Grossly normal  Lab Results:   Recent Labs  08/09/13 2101 08/10/13 0210 08/11/13 0412  WBC 16.7* 12.2* 9.2  HGB 13.9 14.3 12.8*  HCT 40.3 41.4 38.3*  PLT 172 180 173   BMET  Recent Labs  08/09/13 2101 08/10/13 0210 08/11/13 0412  NA 140 139 142  K 3.7 4.0 3.8  CL 100 98 103  CO2 28 29 27   GLUCOSE 119* 142* 141*  BUN 16 15 18   CREATININE 1.21 1.13 1.11  CALCIUM 11.0* 11.4* 9.7   PT/INR  Recent Labs  08/10/13 1344  LABPROT 14.2  INR 1.10  .lipid  Assessment/Plan   Active Problems:   Chest pain   Hyperlipidemia   DM (diabetes mellitus)   HTN (hypertension)   Solitary pulmonary nodule  Plan:    Abnormal NST lead to LHC revealing severe one-vessel coronary artery disease and normal LV systolic function by noninvasive imaging. Normal LV EDP.  He underwent successful angioplasty and drug-eluting stent placement to the distal right coronary artery. Mild to moderate residual disease in the LAD/left circumflex.   ASA, plavix, lipitor, HCTZ 25, lisinopril 40 BID.   HR, BP and labs stable.  WBCs decreased.   Ambulate this morning and DC home after.    LOS: 2 days    HAGER, BRYAN PA-C 08/11/2013 6:44 AM   I have seen and  evaluated the patient this AM along with Tarri Fuller, PA. I agree with his findings, examination as well as impression recommendations.  S/p PCI to distal RCA with DES - on ASA/Plavix, statin & ACE-I.  May have room for BB addition in OP setting.   Doing well today, ambulating without Angina.  Ready for d/c from Cardiac standpoint.  ROV with [place].   Leonie Man, M.D., M.S. Interventional Cardiologist   Pager # [phone] 08/11/2013

## 2013-08-24 ENCOUNTER — Encounter: Payer: Self-pay | Admitting: Physician Assistant

## 2013-08-24 ENCOUNTER — Ambulatory Visit (INDEPENDENT_AMBULATORY_CARE_PROVIDER_SITE_OTHER): Payer: Medicare Other | Admitting: Physician Assistant

## 2013-08-24 VITALS — BP 140/78 | HR 72 | Ht 69.0 in | Wt 238.0 lb

## 2013-08-24 DIAGNOSIS — I2510 Atherosclerotic heart disease of native coronary artery without angina pectoris: Secondary | ICD-10-CM | POA: Diagnosis not present

## 2013-08-24 DIAGNOSIS — R911 Solitary pulmonary nodule: Secondary | ICD-10-CM

## 2013-08-24 DIAGNOSIS — I200 Unstable angina: Secondary | ICD-10-CM

## 2013-08-24 MED ORDER — METOPROLOL TARTRATE 25 MG PO TABS: ORAL_TABLET | ORAL | Status: DC

## 2013-08-24 NOTE — Assessment & Plan Note (Signed)
On Crestor 

## 2013-08-24 NOTE — Assessment & Plan Note (Signed)
Patient had recent drug-eluting stent to the RCA on 08/10/13. He has residual 40% LAD and circumflex stenosis. He is asymptomatic. Will start low-dose Toprol XL 25 mg once daily. Continue aspirin and Plavix. Decrease caffeine intake. Follow up with Dr.Koneswaran in 2 months.

## 2013-08-24 NOTE — Patient Instructions (Signed)
Your physician recommends that you schedule a follow-up appointment in: 2 months with Dr. Bronson Ing  Your physician has recommended you make the following change in your medication:   START METOPROLOL 25MG  DAILY.  Continue all other medications as directed  Please reduce caffeine and sodium in your daily diet.  Thank you for choosing [facility]!!
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
## 2013-08-24 NOTE — Assessment & Plan Note (Addendum)
Followup CT scan recommended in 1 year. Patient is aware.

## 2013-08-24 NOTE — Assessment & Plan Note (Signed)
Blood pressures are low on the high side. Add Toprol.

## 2013-09-27 ENCOUNTER — Encounter (HOSPITAL_COMMUNITY)
Admission: RE | Admit: 2013-09-27 | Discharge: 2013-09-27 | Disposition: A | Discharge status: Home / Self Care | Payer: Medicare Other | Source: Ambulatory Visit | Attending: Cardiovascular Disease | Admitting: Cardiovascular Disease

## 2013-09-27 ENCOUNTER — Encounter (HOSPITAL_COMMUNITY): Payer: Self-pay

## 2013-09-27 VITALS — BP 120/54 | HR 68 | Ht 70.0 in | Wt 243.6 lb

## 2013-09-27 DIAGNOSIS — Z5189 Encounter for other specified aftercare: Secondary | ICD-10-CM | POA: Insufficient documentation

## 2013-09-27 DIAGNOSIS — Z9861 Coronary angioplasty status: Secondary | ICD-10-CM

## 2013-09-27 DIAGNOSIS — I2510 Atherosclerotic heart disease of native coronary artery without angina pectoris: Secondary | ICD-10-CM | POA: Insufficient documentation

## 2013-09-27 NOTE — Patient Instructions (Signed)
Pt has finished orientation and is scheduled to start CR on 10/03/13 at 11 am. Pt has been instructed to arrive to class 15 minutes early for scheduled class. Pt has been instructed to wear comfortable clothing and shoes with rubber soles. Pt has been told to take their medications 1 hour prior to coming to class.  If the patient is not going to attend class, he/she has been instructed to call.

## 2013-09-27 NOTE — Progress Notes (Signed)
Patient was referred to CR by Dr. Bronson Ing due to Coronary Stent placement V45.82. During orientation advised patient on arrival and appointment times what to wear, what to do before, during and after exercise. Reviewed attendance and class policy. Talked about inclement weather and class consultation policy. Pt is scheduled to start Cardiac Rehab on 10/03/13 at 11 am. Pt was advised to come to class 5 minutes before class starts. He was also given instructions on meeting with the dietician and attending the Family Structure classes. Pt is eager to get started. Patient was able to complete 6 minute walk test on 09/27/13.

## 2013-10-03 ENCOUNTER — Encounter (HOSPITAL_COMMUNITY)
Admission: RE | Admit: 2013-10-03 | Discharge: 2013-10-03 | Disposition: A | Discharge status: Home / Self Care | Payer: Medicare Other | Source: Ambulatory Visit | Attending: Cardiovascular Disease | Admitting: Cardiovascular Disease

## 2013-10-03 DIAGNOSIS — Z9861 Coronary angioplasty status: Secondary | ICD-10-CM | POA: Diagnosis not present

## 2013-10-03 DIAGNOSIS — I2510 Atherosclerotic heart disease of native coronary artery without angina pectoris: Secondary | ICD-10-CM | POA: Diagnosis not present

## 2013-10-03 DIAGNOSIS — Z5189 Encounter for other specified aftercare: Secondary | ICD-10-CM | POA: Diagnosis not present

## 2013-10-05 ENCOUNTER — Encounter (HOSPITAL_COMMUNITY)
Admission: RE | Admit: 2013-10-05 | Discharge: 2013-10-05 | Disposition: A | Discharge status: Home / Self Care | Payer: Medicare Other | Source: Ambulatory Visit | Attending: Cardiovascular Disease | Admitting: Cardiovascular Disease

## 2013-10-05 DIAGNOSIS — Z5189 Encounter for other specified aftercare: Secondary | ICD-10-CM | POA: Diagnosis not present

## 2013-10-07 ENCOUNTER — Encounter (HOSPITAL_COMMUNITY)
Admission: RE | Admit: 2013-10-07 | Discharge: 2013-10-07 | Disposition: A | Discharge status: Home / Self Care | Payer: Medicare Other | Source: Ambulatory Visit | Attending: Cardiovascular Disease | Admitting: Cardiovascular Disease

## 2013-10-07 DIAGNOSIS — Z9861 Coronary angioplasty status: Secondary | ICD-10-CM | POA: Diagnosis not present

## 2013-10-07 DIAGNOSIS — Z5189 Encounter for other specified aftercare: Secondary | ICD-10-CM | POA: Insufficient documentation

## 2013-10-07 DIAGNOSIS — I2510 Atherosclerotic heart disease of native coronary artery without angina pectoris: Secondary | ICD-10-CM | POA: Diagnosis not present

## 2013-10-10 ENCOUNTER — Encounter (HOSPITAL_COMMUNITY)
Admission: RE | Admit: 2013-10-10 | Discharge: 2013-10-10 | Disposition: A | Discharge status: Home / Self Care | Payer: Medicare Other | Source: Ambulatory Visit | Attending: Cardiovascular Disease | Admitting: Cardiovascular Disease

## 2013-10-10 DIAGNOSIS — Z5189 Encounter for other specified aftercare: Secondary | ICD-10-CM | POA: Diagnosis not present

## 2013-10-12 ENCOUNTER — Encounter (HOSPITAL_COMMUNITY)
Admission: RE | Admit: 2013-10-12 | Discharge: 2013-10-12 | Disposition: A | Discharge status: Home / Self Care | Payer: Medicare Other | Source: Ambulatory Visit | Attending: Cardiovascular Disease | Admitting: Cardiovascular Disease

## 2013-10-12 DIAGNOSIS — Z5189 Encounter for other specified aftercare: Secondary | ICD-10-CM | POA: Diagnosis not present

## 2013-10-14 ENCOUNTER — Encounter (HOSPITAL_COMMUNITY)
Admission: RE | Admit: 2013-10-14 | Discharge: 2013-10-14 | Disposition: A | Discharge status: Home / Self Care | Payer: Medicare Other | Source: Ambulatory Visit | Attending: Cardiovascular Disease | Admitting: Cardiovascular Disease

## 2013-10-14 DIAGNOSIS — Z5189 Encounter for other specified aftercare: Secondary | ICD-10-CM | POA: Diagnosis not present

## 2013-10-17 ENCOUNTER — Encounter (HOSPITAL_COMMUNITY)
Admission: RE | Admit: 2013-10-17 | Discharge: 2013-10-17 | Disposition: A | Discharge status: Home / Self Care | Payer: Medicare Other | Source: Ambulatory Visit | Attending: Cardiovascular Disease | Admitting: Cardiovascular Disease

## 2013-10-17 DIAGNOSIS — Z5189 Encounter for other specified aftercare: Secondary | ICD-10-CM | POA: Diagnosis not present

## 2013-10-19 ENCOUNTER — Encounter (HOSPITAL_COMMUNITY)
Admission: RE | Admit: 2013-10-19 | Discharge: 2013-10-19 | Disposition: A | Discharge status: Home / Self Care | Payer: Medicare Other | Source: Ambulatory Visit | Attending: Cardiovascular Disease | Admitting: Cardiovascular Disease

## 2013-10-19 DIAGNOSIS — Z5189 Encounter for other specified aftercare: Secondary | ICD-10-CM | POA: Diagnosis not present

## 2013-10-21 ENCOUNTER — Encounter (HOSPITAL_COMMUNITY)
Admission: RE | Admit: 2013-10-21 | Discharge: 2013-10-21 | Disposition: A | Discharge status: Home / Self Care | Payer: Medicare Other | Source: Ambulatory Visit | Attending: Cardiovascular Disease | Admitting: Cardiovascular Disease

## 2013-10-21 DIAGNOSIS — Z5189 Encounter for other specified aftercare: Secondary | ICD-10-CM | POA: Diagnosis not present

## 2013-10-24 ENCOUNTER — Ambulatory Visit (INDEPENDENT_AMBULATORY_CARE_PROVIDER_SITE_OTHER): Payer: Medicare Other | Admitting: Cardiovascular Disease

## 2013-10-24 ENCOUNTER — Encounter: Payer: Self-pay | Admitting: Cardiovascular Disease

## 2013-10-24 ENCOUNTER — Encounter (HOSPITAL_COMMUNITY)
Admission: RE | Admit: 2013-10-24 | Discharge: 2013-10-24 | Disposition: A | Discharge status: Home / Self Care | Payer: Medicare Other | Source: Ambulatory Visit | Attending: Cardiovascular Disease | Admitting: Cardiovascular Disease

## 2013-10-24 VITALS — BP 128/80 | HR 62 | Ht 69.0 in | Wt 251.0 lb

## 2013-10-24 DIAGNOSIS — E785 Hyperlipidemia, unspecified: Secondary | ICD-10-CM

## 2013-10-24 DIAGNOSIS — E119 Type 2 diabetes mellitus without complications: Secondary | ICD-10-CM

## 2013-10-24 DIAGNOSIS — I10 Essential (primary) hypertension: Secondary | ICD-10-CM | POA: Diagnosis not present

## 2013-10-24 DIAGNOSIS — I200 Unstable angina: Secondary | ICD-10-CM | POA: Diagnosis not present

## 2013-10-24 DIAGNOSIS — I2510 Atherosclerotic heart disease of native coronary artery without angina pectoris: Secondary | ICD-10-CM | POA: Diagnosis not present

## 2013-10-24 DIAGNOSIS — Z5189 Encounter for other specified aftercare: Secondary | ICD-10-CM | POA: Diagnosis not present
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
## 2013-10-24 NOTE — Patient Instructions (Signed)
Your physician wants you to follow-up in: 6 months You will receive a reminder letter in the mail two months in advance. If you don't receive a letter, please call our office to schedule the follow-up appointment.     Your physician recommends that you continue on your current medications as directed. Please refer to the Current Medication list given to you today.      Thank you for choosing  [facility] !        

## 2013-10-26 ENCOUNTER — Encounter (HOSPITAL_COMMUNITY)
Admission: RE | Admit: 2013-10-26 | Discharge: 2013-10-26 | Disposition: A | Discharge status: Home / Self Care | Payer: Medicare Other | Source: Ambulatory Visit | Attending: Cardiovascular Disease | Admitting: Cardiovascular Disease

## 2013-10-26 DIAGNOSIS — Z5189 Encounter for other specified aftercare: Secondary | ICD-10-CM | POA: Diagnosis not present

## 2013-10-28 ENCOUNTER — Encounter (HOSPITAL_COMMUNITY)
Admission: RE | Admit: 2013-10-28 | Discharge: 2013-10-28 | Disposition: A | Discharge status: Home / Self Care | Payer: Medicare Other | Source: Ambulatory Visit | Attending: Cardiovascular Disease | Admitting: Cardiovascular Disease

## 2013-10-28 DIAGNOSIS — Z5189 Encounter for other specified aftercare: Secondary | ICD-10-CM | POA: Diagnosis not present

## 2013-10-31 ENCOUNTER — Telehealth: Payer: Self-pay | Admitting: *Deleted

## 2013-10-31 ENCOUNTER — Encounter (HOSPITAL_COMMUNITY)
Admission: RE | Admit: 2013-10-31 | Discharge: 2013-10-31 | Disposition: A | Discharge status: Home / Self Care | Payer: Medicare Other | Source: Ambulatory Visit | Attending: Cardiovascular Disease | Admitting: Cardiovascular Disease

## 2013-10-31 DIAGNOSIS — Z6833 Body mass index (BMI) 33.0-33.9, adult: Secondary | ICD-10-CM | POA: Diagnosis not present

## 2013-10-31 DIAGNOSIS — E1140 Type 2 diabetes mellitus with diabetic neuropathy, unspecified: Secondary | ICD-10-CM | POA: Diagnosis not present

## 2013-10-31 DIAGNOSIS — E6609 Other obesity due to excess calories: Secondary | ICD-10-CM | POA: Diagnosis not present

## 2013-10-31 DIAGNOSIS — Z5189 Encounter for other specified aftercare: Secondary | ICD-10-CM | POA: Diagnosis not present

## 2013-10-31 DIAGNOSIS — Z23 Encounter for immunization: Secondary | ICD-10-CM | POA: Diagnosis not present

## 2013-10-31 DIAGNOSIS — I10 Essential (primary) hypertension: Secondary | ICD-10-CM | POA: Diagnosis not present

## 2013-10-31 DIAGNOSIS — I2510 Atherosclerotic heart disease of native coronary artery without angina pectoris: Secondary | ICD-10-CM | POA: Diagnosis not present

## 2013-10-31 NOTE — Telephone Encounter (Signed)
Received labs in Dr. Bronson Ing folder

## 2013-11-02 ENCOUNTER — Encounter (HOSPITAL_COMMUNITY)
Admission: RE | Admit: 2013-11-02 | Discharge: 2013-11-02 | Disposition: A | Discharge status: Home / Self Care | Payer: Medicare Other | Source: Ambulatory Visit | Attending: Cardiovascular Disease | Admitting: Cardiovascular Disease

## 2013-11-02 DIAGNOSIS — Z5189 Encounter for other specified aftercare: Secondary | ICD-10-CM | POA: Diagnosis not present

## 2013-11-02 NOTE — Addendum Note (Signed)
Encounter addended by: Cathie Olden, RN on: 11/02/2013  4:01 PM<BR>     Documentation filed: Notes Section

## 2013-11-02 NOTE — Progress Notes (Signed)
Cardiac Rehabilitation Program Outcomes Report   Orientation:  09/27/13 Graduate Date:  tbd Discharge Date:  tbd # of sessions completed: 3  Cardiologist: Bronson Ing Family MD:  Haynes Bast Time:  1100  A.  Exercise Program:  Tolerates exercise @ 2.5 METS for 15 minutes and Walk Test Results:  Pre: 3.1 mets  B.  Mental Health:  Good mental attitude  C.  Education/Instruction/Skills  Accurately checks own pulse.  Rest:  63  Exercise:  101 and Knows THR for exercise  Uses Perceived Exertion Scale and/or Dyspnea Scale  D.  Nutrition/Weight Control/Body Composition:  Adherence to prescribed nutrition program: good    E.  Blood Lipids    Lab Results  Component Value Date   CHOL  Value: 122        ATP III CLASSIFICATION:  <200     mg/dL   Desirable  200-239  mg/dL   Borderline High  >=240    mg/dL   High 10/27/2007   HDL 39* 10/27/2007   LDLCALC  Value: 68        Total Cholesterol/HDL:CHD Risk Coronary Heart Disease Risk Table                     Men   Women  1/2 Average Risk   3.4   3.3 10/27/2007   TRIG 76 10/27/2007   CHOLHDL 3.1 10/27/2007    F.  Lifestyle Changes:  Making positive lifestyle changes  G.  Symptoms noted with exercise:  Asymptomatic  Report Completed By:  Benay Pillow RN    Comments:  First week progress note.

## 2013-11-04 ENCOUNTER — Encounter (HOSPITAL_COMMUNITY)
Admission: RE | Admit: 2013-11-04 | Discharge: 2013-11-04 | Disposition: A | Discharge status: Home / Self Care | Payer: Medicare Other | Source: Ambulatory Visit | Attending: Cardiovascular Disease | Admitting: Cardiovascular Disease

## 2013-11-04 DIAGNOSIS — Z5189 Encounter for other specified aftercare: Secondary | ICD-10-CM | POA: Diagnosis not present

## 2013-11-07 ENCOUNTER — Encounter: Payer: Self-pay | Admitting: Cardiovascular Disease

## 2013-11-07 ENCOUNTER — Encounter (HOSPITAL_COMMUNITY)
Admission: RE | Admit: 2013-11-07 | Discharge: 2013-11-07 | Disposition: A | Discharge status: Home / Self Care | Payer: Medicare Other | Source: Ambulatory Visit | Attending: Cardiovascular Disease | Admitting: Cardiovascular Disease

## 2013-11-07 DIAGNOSIS — I2510 Atherosclerotic heart disease of native coronary artery without angina pectoris: Secondary | ICD-10-CM | POA: Diagnosis not present

## 2013-11-07 DIAGNOSIS — Z5189 Encounter for other specified aftercare: Secondary | ICD-10-CM | POA: Diagnosis not present

## 2013-11-07 DIAGNOSIS — Z9861 Coronary angioplasty status: Secondary | ICD-10-CM | POA: Insufficient documentation

## 2013-11-09 ENCOUNTER — Encounter (HOSPITAL_COMMUNITY): Payer: Medicare Other

## 2013-11-09 DIAGNOSIS — Z5189 Encounter for other specified aftercare: Secondary | ICD-10-CM | POA: Diagnosis not present

## 2013-11-11 ENCOUNTER — Encounter (HOSPITAL_COMMUNITY)
Admission: RE | Admit: 2013-11-11 | Discharge: 2013-11-11 | Disposition: A | Discharge status: Home / Self Care | Payer: Medicare Other | Source: Ambulatory Visit | Attending: Cardiovascular Disease | Admitting: Cardiovascular Disease

## 2013-11-11 DIAGNOSIS — Z5189 Encounter for other specified aftercare: Secondary | ICD-10-CM | POA: Diagnosis not present

## 2013-11-11 NOTE — Progress Notes (Signed)
Cardiac Rehabilitation Program Outcomes Report   Orientation:  09/27/13 Graduate Date:  tbd Discharge Date:  tbd # of sessions completed: 18  Cardiologist: Gaynelle Cage MD:  Haynes Bast Time:  1100  A.  Exercise Program:  Tolerates exercise @ 3.71 METS for 15 minutes  B.  Mental Health:  Good mental attitude  C.  Education/Instruction/Skills  Accurately checks own pulse.  Rest:  63  Exercise:  103 and Knows THR for exercise  Uses Perceived Exertion Scale and/or Dyspnea Scale  D.  Nutrition/Weight Control/Body Composition:  Adherence to prescribed nutrition program: good    E.  Blood Lipids    Lab Results  Component Value Date   CHOL  10/27/2007    122        ATP III CLASSIFICATION:  <200     mg/dL   Desirable  200-239  mg/dL   Borderline High  >=240    mg/dL   High   HDL 39* 10/27/2007   LDLCALC  10/27/2007    68        Total Cholesterol/HDL:CHD Risk Coronary Heart Disease Risk Table                     Men   Women  1/2 Average Risk   3.4   3.3   TRIG 76 10/27/2007   CHOLHDL 3.1 10/27/2007    F.  Lifestyle Changes:  Making positive lifestyle changes  G.  Symptoms noted with exercise:  Asymptomatic  Report Completed By:  Benay Pillow RN   Comments:  Halfway progress note.

## 2013-11-14 ENCOUNTER — Encounter (HOSPITAL_COMMUNITY)
Admission: RE | Admit: 2013-11-14 | Discharge: 2013-11-14 | Disposition: A | Discharge status: Home / Self Care | Payer: Medicare Other | Source: Ambulatory Visit | Attending: Cardiovascular Disease | Admitting: Cardiovascular Disease

## 2013-11-14 DIAGNOSIS — Z5189 Encounter for other specified aftercare: Secondary | ICD-10-CM | POA: Diagnosis not present

## 2013-11-16 ENCOUNTER — Encounter (HOSPITAL_COMMUNITY)
Admission: RE | Admit: 2013-11-16 | Discharge: 2013-11-16 | Disposition: A | Discharge status: Home / Self Care | Payer: Medicare Other | Source: Ambulatory Visit | Attending: Cardiovascular Disease | Admitting: Cardiovascular Disease

## 2013-11-16 DIAGNOSIS — Z5189 Encounter for other specified aftercare: Secondary | ICD-10-CM | POA: Diagnosis not present

## 2013-11-18 ENCOUNTER — Encounter (HOSPITAL_COMMUNITY)
Admission: RE | Admit: 2013-11-18 | Discharge: 2013-11-18 | Disposition: A | Discharge status: Home / Self Care | Payer: Medicare Other | Source: Ambulatory Visit | Attending: Cardiovascular Disease | Admitting: Cardiovascular Disease

## 2013-11-18 DIAGNOSIS — Z5189 Encounter for other specified aftercare: Secondary | ICD-10-CM | POA: Diagnosis not present

## 2013-11-21 ENCOUNTER — Encounter (HOSPITAL_COMMUNITY)
Admission: RE | Admit: 2013-11-21 | Discharge: 2013-11-21 | Disposition: A | Discharge status: Home / Self Care | Payer: Medicare Other | Source: Ambulatory Visit | Attending: Cardiovascular Disease | Admitting: Cardiovascular Disease

## 2013-11-21 DIAGNOSIS — Z5189 Encounter for other specified aftercare: Secondary | ICD-10-CM | POA: Diagnosis not present

## 2013-11-23 ENCOUNTER — Encounter (HOSPITAL_COMMUNITY)
Admission: RE | Admit: 2013-11-23 | Discharge: 2013-11-23 | Disposition: A | Discharge status: Home / Self Care | Payer: Medicare Other | Source: Ambulatory Visit | Attending: Cardiovascular Disease | Admitting: Cardiovascular Disease

## 2013-11-23 DIAGNOSIS — Z5189 Encounter for other specified aftercare: Secondary | ICD-10-CM | POA: Diagnosis not present

## 2013-11-25 ENCOUNTER — Encounter (HOSPITAL_COMMUNITY)
Admission: RE | Admit: 2013-11-25 | Discharge: 2013-11-25 | Disposition: A | Discharge status: Home / Self Care | Payer: Medicare Other | Source: Ambulatory Visit | Attending: Cardiovascular Disease | Admitting: Cardiovascular Disease

## 2013-11-25 DIAGNOSIS — Z5189 Encounter for other specified aftercare: Secondary | ICD-10-CM | POA: Diagnosis not present

## 2013-11-28 ENCOUNTER — Encounter (HOSPITAL_COMMUNITY)
Admission: RE | Admit: 2013-11-28 | Discharge: 2013-11-28 | Disposition: A | Discharge status: Home / Self Care | Payer: Medicare Other | Source: Ambulatory Visit | Attending: Cardiovascular Disease | Admitting: Cardiovascular Disease

## 2013-11-28 DIAGNOSIS — Z5189 Encounter for other specified aftercare: Secondary | ICD-10-CM | POA: Diagnosis not present

## 2013-11-30 ENCOUNTER — Encounter (HOSPITAL_COMMUNITY)
Admission: RE | Admit: 2013-11-30 | Discharge: 2013-11-30 | Disposition: A | Discharge status: Home / Self Care | Payer: Medicare Other | Source: Ambulatory Visit | Attending: Cardiovascular Disease | Admitting: Cardiovascular Disease

## 2013-11-30 DIAGNOSIS — Z5189 Encounter for other specified aftercare: Secondary | ICD-10-CM | POA: Diagnosis not present

## 2013-12-02 ENCOUNTER — Encounter (HOSPITAL_COMMUNITY): Payer: Medicare Other

## 2013-12-05 ENCOUNTER — Encounter (HOSPITAL_COMMUNITY)
Admission: RE | Admit: 2013-12-05 | Discharge: 2013-12-05 | Disposition: A | Discharge status: Home / Self Care | Payer: Medicare Other | Source: Ambulatory Visit | Attending: Cardiovascular Disease | Admitting: Cardiovascular Disease

## 2013-12-05 DIAGNOSIS — Z5189 Encounter for other specified aftercare: Secondary | ICD-10-CM | POA: Diagnosis not present

## 2013-12-07 ENCOUNTER — Emergency Department (HOSPITAL_COMMUNITY): Payer: Medicare Other

## 2013-12-07 ENCOUNTER — Encounter (HOSPITAL_COMMUNITY): Payer: Medicare Other

## 2013-12-07 ENCOUNTER — Emergency Department (HOSPITAL_COMMUNITY)
Admission: EM | Admit: 2013-12-07 | Discharge: 2013-12-07 | Disposition: A | Discharge status: Home / Self Care | Payer: Medicare Other | Attending: Emergency Medicine | Admitting: Emergency Medicine

## 2013-12-07 ENCOUNTER — Encounter (HOSPITAL_COMMUNITY): Payer: Self-pay | Admitting: *Deleted

## 2013-12-07 DIAGNOSIS — Z794 Long term (current) use of insulin: Secondary | ICD-10-CM | POA: Insufficient documentation

## 2013-12-07 DIAGNOSIS — N200 Calculus of kidney: Secondary | ICD-10-CM | POA: Diagnosis not present

## 2013-12-07 DIAGNOSIS — Z9089 Acquired absence of other organs: Secondary | ICD-10-CM | POA: Insufficient documentation

## 2013-12-07 DIAGNOSIS — R109 Unspecified abdominal pain: Secondary | ICD-10-CM | POA: Insufficient documentation

## 2013-12-07 DIAGNOSIS — Z7902 Long term (current) use of antithrombotics/antiplatelets: Secondary | ICD-10-CM | POA: Diagnosis not present

## 2013-12-07 DIAGNOSIS — K5730 Diverticulosis of large intestine without perforation or abscess without bleeding: Secondary | ICD-10-CM | POA: Diagnosis not present

## 2013-12-07 DIAGNOSIS — Z7982 Long term (current) use of aspirin: Secondary | ICD-10-CM | POA: Diagnosis not present

## 2013-12-07 DIAGNOSIS — Z9861 Coronary angioplasty status: Secondary | ICD-10-CM | POA: Diagnosis not present

## 2013-12-07 DIAGNOSIS — I10 Essential (primary) hypertension: Secondary | ICD-10-CM | POA: Insufficient documentation

## 2013-12-07 DIAGNOSIS — N400 Enlarged prostate without lower urinary tract symptoms: Secondary | ICD-10-CM | POA: Diagnosis not present

## 2013-12-07 DIAGNOSIS — Z87891 Personal history of nicotine dependence: Secondary | ICD-10-CM | POA: Diagnosis not present

## 2013-12-07 DIAGNOSIS — Z79899 Other long term (current) drug therapy: Secondary | ICD-10-CM | POA: Diagnosis not present

## 2013-12-07 DIAGNOSIS — E119 Type 2 diabetes mellitus without complications: Secondary | ICD-10-CM | POA: Diagnosis not present

## 2013-12-07 DIAGNOSIS — N1330 Unspecified hydronephrosis: Secondary | ICD-10-CM | POA: Diagnosis not present

## 2013-12-07 LAB — CBC WITH DIFFERENTIAL/PLATELET
BASOS PCT: 0 % (ref 0–1)
Basophils Absolute: 0 10*3/uL (ref 0.0–0.1)
EOS ABS: 0.5 10*3/uL (ref 0.0–0.7)
Eosinophils Relative: 4 % (ref 0–5)
HEMATOCRIT: 42.5 % (ref 39.0–52.0)
HEMOGLOBIN: 13.9 g/dL (ref 13.0–17.0)
LYMPHS ABS: 2 10*3/uL (ref 0.7–4.0)
Lymphocytes Relative: 17 % (ref 12–46)
MCH: 31.1 pg (ref 26.0–34.0)
MCHC: 32.7 g/dL (ref 30.0–36.0)
MCV: 95.1 fL (ref 78.0–100.0)
MONO ABS: 1.1 10*3/uL — AB (ref 0.1–1.0)
MONOS PCT: 10 % (ref 3–12)
NEUTROS PCT: 69 % (ref 43–77)
Neutro Abs: 8.1 10*3/uL — ABNORMAL HIGH (ref 1.7–7.7)
Platelets: 200 10*3/uL (ref 150–400)
RBC: 4.47 MIL/uL (ref 4.22–5.81)
RDW: 13 % (ref 11.5–15.5)
WBC: 11.7 10*3/uL — ABNORMAL HIGH (ref 4.0–10.5)

## 2013-12-07 LAB — COMPREHENSIVE METABOLIC PANEL
ALT: 18 U/L (ref 0–53)
ANION GAP: 11 (ref 5–15)
AST: 17 U/L (ref 0–37)
Albumin: 4.1 g/dL (ref 3.5–5.2)
Alkaline Phosphatase: 39 U/L (ref 39–117)
BILIRUBIN TOTAL: 0.3 mg/dL (ref 0.3–1.2)
BUN: 17 mg/dL (ref 6–23)
CO2: 27 mEq/L (ref 19–32)
CREATININE: 1.21 mg/dL (ref 0.50–1.35)
Calcium: 9.5 mg/dL (ref 8.4–10.5)
Chloride: 104 mEq/L (ref 96–112)
GFR calc Af Amer: 70 mL/min — ABNORMAL LOW (ref >90)
GFR calc non Af Amer: 60 mL/min — ABNORMAL LOW (ref >90)
GLUCOSE: 192 mg/dL — AB (ref 70–99)
Potassium: 4.3 mEq/L (ref 3.7–5.3)
Sodium: 142 mEq/L (ref 137–147)
TOTAL PROTEIN: 7.9 g/dL (ref 6.0–8.3)

## 2013-12-07 MED ORDER — HYDROMORPHONE HCL 1 MG/ML IJ SOLN
1.0000 mg | Freq: Once | INTRAMUSCULAR | Status: AC
  Administered 2013-12-07: 1 mg via INTRAVENOUS
  Filled 2013-12-07: qty 1

## 2013-12-07 MED ORDER — NAPROXEN 500 MG PO TABS: 500.0000 mg | ORAL_TABLET | Freq: Two times a day (BID) | ORAL | Status: DC

## 2013-12-07 MED ORDER — KETOROLAC TROMETHAMINE 30 MG/ML IJ SOLN
30.0000 mg | Freq: Once | INTRAMUSCULAR | Status: AC
  Administered 2013-12-07: 30 mg via INTRAVENOUS
  Filled 2013-12-07: qty 1

## 2013-12-07 MED ORDER — KETOROLAC TROMETHAMINE 30 MG/ML IJ SOLN: 30.0000 mg | Freq: Once | INTRAMUSCULAR | Status: DC

## 2013-12-07 MED ORDER — ONDANSETRON 8 MG PO TBDP: 8.0000 mg | ORAL_TABLET | Freq: Three times a day (TID) | ORAL | Status: DC | PRN
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
## 2013-12-07 NOTE — Discharge Instructions (Signed)
Ureteral Colic (Kidney Stones) °Ureteral colic is the result of a condition when kidney stones form inside the kidney. Once kidney stones are formed they may move into the tube that connects the kidney with the bladder (ureter). If this occurs, this condition may cause pain (colic) in the ureter.  °CAUSES  °Pain is caused by stone movement in the ureter and the obstruction caused by the stone. °SYMPTOMS  °The pain comes and goes as the ureter contracts around the stone. The pain is usually intense, sharp, and stabbing in character. The location of the pain may move as the stone moves through the ureter. When the stone is near the kidney the pain is usually located in the back and radiates to the belly (abdomen). When the stone is ready to pass into the bladder the pain is often located in the lower abdomen on the side the stone is located. At this location, the symptoms may mimic those of a urinary tract infection with urinary frequency. Once the stone is located here it often passes into the bladder and the pain disappears completely. °TREATMENT  °· Your caregiver will provide you with medicine for pain relief. °· You may require specialized follow-up X-rays. °· The absence of pain does not always mean that the stone has passed. It may have just stopped moving. If the urine remains completely obstructed, it can cause loss of kidney function or even complete destruction of the involved kidney. It is your responsibility and in your interest that X-rays and follow-ups as suggested by your caregiver are completed. Relief of pain without passage of the stone can be associated with severe damage to the kidney, including loss of kidney function on that side. °· If your stone does not pass on its own, additional measures may be taken by your caregiver to ensure its removal. °HOME CARE INSTRUCTIONS  °· Increase your fluid intake. Water is the preferred fluid since juices containing vitamin C may acidify the urine making it  less likely for certain stones (uric acid stones) to pass. °· Strain all urine. A strainer will be provided. Keep all particulate matter or stones for your caregiver to inspect. °· Take your pain medicine as directed. °· Make a follow-up appointment with your caregiver as directed. °· Remember that the goal is passage of your stone. The absence of pain does not mean the stone is gone. Follow your caregiver's instructions. °· Only take over-the-counter or prescription medicines for pain, discomfort, or fever as directed by your caregiver. °SEEK MEDICAL CARE IF:  °· Pain cannot be controlled with the prescribed medicine. °· You have a fever. °· Pain continues for longer than your caregiver advises it should. °· There is a change in the pain, and you develop chest discomfort or constant abdominal pain. °· You feel faint or pass out. °MAKE SURE YOU:  °· Understand these instructions. °· Will watch your condition. °· Will get help right away if you are not doing well or get worse. °Document Released: 10/02/2004 Document Revised: 04/19/2012 Document Reviewed: 06/19/2010 °ExitCare® Patient Information ©2015 [employer]. This information is not intended to replace advice given to you by your health care provider. Make sure you discuss any questions you have with your health care provider. ° °

## 2013-12-07 NOTE — ED Notes (Signed)
Pt reporting pain in right flank area.  States pain has been intermittent for past 3-4 days.  States sometimes it improves with walking, but today has not.

## 2013-12-09 ENCOUNTER — Encounter (HOSPITAL_COMMUNITY): Payer: Medicare Other

## 2013-12-12 ENCOUNTER — Encounter (HOSPITAL_COMMUNITY): Payer: Medicare Other

## 2013-12-12 DIAGNOSIS — N201 Calculus of ureter: Secondary | ICD-10-CM | POA: Diagnosis not present

## 2013-12-14 ENCOUNTER — Encounter (HOSPITAL_COMMUNITY)
Admission: RE | Admit: 2013-12-14 | Discharge: 2013-12-14 | Disposition: A | Discharge status: Home / Self Care | Payer: Medicare Other | Source: Ambulatory Visit | Attending: Cardiovascular Disease | Admitting: Cardiovascular Disease

## 2013-12-14 DIAGNOSIS — Z5189 Encounter for other specified aftercare: Secondary | ICD-10-CM | POA: Insufficient documentation

## 2013-12-14 DIAGNOSIS — I2510 Atherosclerotic heart disease of native coronary artery without angina pectoris: Secondary | ICD-10-CM | POA: Diagnosis not present

## 2013-12-14 DIAGNOSIS — Z9861 Coronary angioplasty status: Secondary | ICD-10-CM | POA: Diagnosis not present

## 2013-12-15 ENCOUNTER — Encounter (HOSPITAL_COMMUNITY): Payer: Self-pay | Admitting: Cardiovascular Disease

## 2013-12-16 ENCOUNTER — Encounter (HOSPITAL_COMMUNITY)
Admission: RE | Admit: 2013-12-16 | Discharge: 2013-12-16 | Disposition: A | Discharge status: Home / Self Care | Payer: Medicare Other | Source: Ambulatory Visit | Attending: Cardiovascular Disease | Admitting: Cardiovascular Disease

## 2013-12-16 DIAGNOSIS — Z5189 Encounter for other specified aftercare: Secondary | ICD-10-CM | POA: Diagnosis not present

## 2013-12-19 ENCOUNTER — Encounter (HOSPITAL_COMMUNITY)
Admission: RE | Admit: 2013-12-19 | Discharge: 2013-12-19 | Disposition: A | Discharge status: Home / Self Care | Payer: Medicare Other | Source: Ambulatory Visit | Attending: Cardiovascular Disease | Admitting: Cardiovascular Disease

## 2013-12-19 ENCOUNTER — Other Ambulatory Visit: Payer: Self-pay | Admitting: Physician Assistant

## 2013-12-19 DIAGNOSIS — Z5189 Encounter for other specified aftercare: Secondary | ICD-10-CM | POA: Diagnosis not present

## 2013-12-20 DIAGNOSIS — N201 Calculus of ureter: Secondary | ICD-10-CM | POA: Diagnosis not present

## 2013-12-21 ENCOUNTER — Encounter (HOSPITAL_COMMUNITY)
Admission: RE | Admit: 2013-12-21 | Discharge: 2013-12-21 | Disposition: A | Discharge status: Home / Self Care | Payer: Medicare Other | Source: Ambulatory Visit | Attending: Cardiovascular Disease | Admitting: Cardiovascular Disease

## 2013-12-21 DIAGNOSIS — Z5189 Encounter for other specified aftercare: Secondary | ICD-10-CM | POA: Diagnosis not present

## 2013-12-23 ENCOUNTER — Encounter (HOSPITAL_COMMUNITY)
Admission: RE | Admit: 2013-12-23 | Discharge: 2013-12-23 | Disposition: A | Discharge status: Home / Self Care | Payer: Medicare Other | Source: Ambulatory Visit | Attending: Cardiovascular Disease | Admitting: Cardiovascular Disease

## 2013-12-23 DIAGNOSIS — Z5189 Encounter for other specified aftercare: Secondary | ICD-10-CM | POA: Diagnosis not present

## 2013-12-26 ENCOUNTER — Other Ambulatory Visit (HOSPITAL_COMMUNITY): Payer: Self-pay | Admitting: Urology

## 2013-12-26 ENCOUNTER — Encounter (HOSPITAL_COMMUNITY)
Admission: RE | Admit: 2013-12-26 | Discharge: 2013-12-26 | Disposition: A | Discharge status: Home / Self Care | Payer: Medicare Other | Source: Ambulatory Visit | Attending: Cardiovascular Disease | Admitting: Cardiovascular Disease

## 2013-12-26 ENCOUNTER — Ambulatory Visit (HOSPITAL_COMMUNITY)
Admission: RE | Admit: 2013-12-26 | Discharge: 2013-12-26 | Disposition: A | Discharge status: Home / Self Care | Payer: Medicare Other | Source: Ambulatory Visit | Attending: Urology | Admitting: Urology

## 2013-12-26 DIAGNOSIS — K439 Ventral hernia without obstruction or gangrene: Secondary | ICD-10-CM | POA: Insufficient documentation

## 2013-12-26 DIAGNOSIS — Z5189 Encounter for other specified aftercare: Secondary | ICD-10-CM | POA: Diagnosis not present

## 2013-12-26 DIAGNOSIS — N201 Calculus of ureter: Secondary | ICD-10-CM

## 2013-12-26 DIAGNOSIS — M5134 Other intervertebral disc degeneration, thoracic region: Secondary | ICD-10-CM | POA: Insufficient documentation

## 2013-12-26 DIAGNOSIS — K5730 Diverticulosis of large intestine without perforation or abscess without bleeding: Secondary | ICD-10-CM | POA: Insufficient documentation

## 2013-12-26 DIAGNOSIS — N132 Hydronephrosis with renal and ureteral calculous obstruction: Secondary | ICD-10-CM | POA: Insufficient documentation

## 2013-12-26 DIAGNOSIS — I2510 Atherosclerotic heart disease of native coronary artery without angina pectoris: Secondary | ICD-10-CM | POA: Diagnosis not present

## 2013-12-26 DIAGNOSIS — K429 Umbilical hernia without obstruction or gangrene: Secondary | ICD-10-CM | POA: Diagnosis not present

## 2013-12-26 DIAGNOSIS — R109 Unspecified abdominal pain: Secondary | ICD-10-CM | POA: Diagnosis present

## 2013-12-26 DIAGNOSIS — I700 Atherosclerosis of aorta: Secondary | ICD-10-CM | POA: Insufficient documentation

## 2013-12-26 DIAGNOSIS — K4090 Unilateral inguinal hernia, without obstruction or gangrene, not specified as recurrent: Secondary | ICD-10-CM | POA: Insufficient documentation

## 2013-12-28 ENCOUNTER — Encounter (HOSPITAL_COMMUNITY)
Admission: RE | Admit: 2013-12-28 | Discharge: 2013-12-28 | Disposition: A | Discharge status: Home / Self Care | Payer: Medicare Other | Source: Ambulatory Visit | Attending: Cardiovascular Disease | Admitting: Cardiovascular Disease

## 2013-12-28 DIAGNOSIS — Z5189 Encounter for other specified aftercare: Secondary | ICD-10-CM | POA: Diagnosis not present

## 2013-12-30 ENCOUNTER — Encounter (HOSPITAL_COMMUNITY): Payer: Medicare Other

## 2014-01-02 ENCOUNTER — Encounter (HOSPITAL_COMMUNITY)
Admission: RE | Admit: 2014-01-02 | Discharge: 2014-01-02 | Disposition: A | Discharge status: Home / Self Care | Payer: Medicare Other | Source: Ambulatory Visit | Attending: Cardiovascular Disease | Admitting: Cardiovascular Disease

## 2014-01-02 DIAGNOSIS — Z5189 Encounter for other specified aftercare: Secondary | ICD-10-CM | POA: Diagnosis not present

## 2014-01-04 ENCOUNTER — Encounter (HOSPITAL_COMMUNITY)
Admission: RE | Admit: 2014-01-04 | Discharge: 2014-01-04 | Disposition: A | Discharge status: Home / Self Care | Payer: Medicare Other | Source: Ambulatory Visit | Attending: Cardiovascular Disease | Admitting: Cardiovascular Disease

## 2014-01-04 DIAGNOSIS — Z5189 Encounter for other specified aftercare: Secondary | ICD-10-CM | POA: Diagnosis not present

## 2014-01-04 NOTE — Progress Notes (Signed)
Patient is discharged from [facility] program today January 04, 2014 with 36 sessions.  He achieved LTG of 30 minutes of aerobic exercise at max met level of 3.70.  All patient vitals are WNL.   Discharge instructions have been reviewed in detail and patient expressed an understanding of material given.  Patient plans to join the maintenance program next Monday. Cardiac Rehab will make 1 month, 6 month and 1 year call backs.  Patient had no complaints of any abnormal S/S or pain on their exit visit.  Pt finished post 6 in walk test.

## 2014-01-04 NOTE — Progress Notes (Signed)
Cardiac Rehabilitation Program Outcomes Report   Orientation:  09/27/13 Graduate Date:  01/04/14 Discharge Date:  01/04/14 # of sessions completed: 36  Cardiologist: Bronson Ing Family MD:  Haynes Bast Time:  1100  A.  Exercise Program:  Tolerates exercise @ 3.70 METS for 15 minutes and Walk Test Results:  Pre: 3.53  B.  Mental Health:  Good mental attitude  C.  Education/Instruction/Skills  Accurately checks own pulse.  Rest:  68  Exercise:  108, Knows THR for exercise and Attended 13 education classes  Uses Perceived Exertion Scale and/or Dyspnea Scale  D.  Nutrition/Weight Control/Body Composition:  Adherence to prescribed nutrition program: good    E.  Blood Lipids    Lab Results  Component Value Date   CHOL  10/27/2007    122        ATP III CLASSIFICATION:  <200     mg/dL   Desirable  200-239  mg/dL   Borderline High  >=240    mg/dL   High   HDL 39* 10/27/2007   LDLCALC  10/27/2007    68        Total Cholesterol/HDL:CHD Risk Coronary Heart Disease Risk Table                     Men   Women  1/2 Average Risk   3.4   3.3   TRIG 76 10/27/2007   CHOLHDL 3.1 10/27/2007    F.  Lifestyle Changes:  Making positive lifestyle changes  G.  Symptoms noted with exercise:  Asymptomatic  Report Completed By:  Stevphen Rochester RN    Comments:  Patient has graduated from [employer] today. Patient is joining the maintenance program starting next Monday. Patient has done very well in the program and feels he is doing better and feeling better.

## 2014-01-06 ENCOUNTER — Encounter (HOSPITAL_COMMUNITY): Payer: Medicare Other

## 2014-01-09 ENCOUNTER — Encounter (HOSPITAL_COMMUNITY)
Admission: RE | Admit: 2014-01-09 | Discharge: 2014-01-09 | Disposition: A | Discharge status: Home / Self Care | Payer: Self-pay | Source: Ambulatory Visit | Attending: Cardiovascular Disease | Admitting: Cardiovascular Disease

## 2014-01-09 ENCOUNTER — Encounter (HOSPITAL_COMMUNITY): Payer: Medicare Other

## 2014-01-11 ENCOUNTER — Encounter (HOSPITAL_COMMUNITY): Payer: Medicare Other

## 2014-01-11 ENCOUNTER — Encounter (HOSPITAL_COMMUNITY)
Admission: RE | Admit: 2014-01-11 | Discharge: 2014-01-11 | Disposition: A | Discharge status: Home / Self Care | Payer: Self-pay | Source: Ambulatory Visit | Attending: Cardiovascular Disease | Admitting: Cardiovascular Disease

## 2014-01-13 ENCOUNTER — Encounter (HOSPITAL_COMMUNITY)
Admission: RE | Admit: 2014-01-13 | Discharge: 2014-01-13 | Disposition: A | Discharge status: Home / Self Care | Payer: Self-pay | Source: Ambulatory Visit | Attending: Cardiovascular Disease | Admitting: Cardiovascular Disease

## 2014-01-16 ENCOUNTER — Encounter (HOSPITAL_COMMUNITY)
Admission: RE | Admit: 2014-01-16 | Discharge: 2014-01-16 | Disposition: A | Discharge status: Home / Self Care | Payer: Self-pay | Source: Ambulatory Visit | Attending: Cardiovascular Disease | Admitting: Cardiovascular Disease

## 2014-01-18 ENCOUNTER — Encounter (HOSPITAL_COMMUNITY)
Admission: RE | Admit: 2014-01-18 | Discharge: 2014-01-18 | Disposition: A | Discharge status: Home / Self Care | Payer: Self-pay | Source: Ambulatory Visit | Attending: Cardiovascular Disease | Admitting: Cardiovascular Disease

## 2014-01-20 ENCOUNTER — Encounter (HOSPITAL_COMMUNITY)
Admission: RE | Admit: 2014-01-20 | Discharge: 2014-01-20 | Disposition: A | Discharge status: Home / Self Care | Payer: Self-pay | Source: Ambulatory Visit | Attending: Cardiovascular Disease | Admitting: Cardiovascular Disease

## 2014-01-23 ENCOUNTER — Encounter (HOSPITAL_COMMUNITY)
Admission: RE | Admit: 2014-01-23 | Discharge: 2014-01-23 | Disposition: A | Discharge status: Home / Self Care | Payer: Self-pay | Source: Ambulatory Visit | Attending: Cardiovascular Disease | Admitting: Cardiovascular Disease

## 2014-01-25 ENCOUNTER — Encounter (HOSPITAL_COMMUNITY)
Admission: RE | Admit: 2014-01-25 | Discharge: 2014-01-25 | Disposition: A | Discharge status: Home / Self Care | Payer: Self-pay | Source: Ambulatory Visit | Attending: Cardiovascular Disease | Admitting: Cardiovascular Disease

## 2014-01-27 ENCOUNTER — Encounter (HOSPITAL_COMMUNITY): Payer: Self-pay

## 2014-01-30 ENCOUNTER — Encounter (HOSPITAL_COMMUNITY)
Admission: RE | Admit: 2014-01-30 | Discharge: 2014-01-30 | Disposition: A | Discharge status: Home / Self Care | Payer: Self-pay | Source: Ambulatory Visit | Attending: Cardiovascular Disease | Admitting: Cardiovascular Disease

## 2014-02-01 ENCOUNTER — Encounter (HOSPITAL_COMMUNITY)
Admission: RE | Admit: 2014-02-01 | Discharge: 2014-02-01 | Disposition: A | Discharge status: Home / Self Care | Payer: Self-pay | Source: Ambulatory Visit | Attending: Cardiovascular Disease | Admitting: Cardiovascular Disease

## 2014-02-02 DIAGNOSIS — F419 Anxiety disorder, unspecified: Secondary | ICD-10-CM | POA: Diagnosis not present

## 2014-02-02 DIAGNOSIS — M1991 Primary osteoarthritis, unspecified site: Secondary | ICD-10-CM | POA: Diagnosis not present

## 2014-02-02 DIAGNOSIS — Z6833 Body mass index (BMI) 33.0-33.9, adult: Secondary | ICD-10-CM | POA: Diagnosis not present

## 2014-02-02 DIAGNOSIS — E6609 Other obesity due to excess calories: Secondary | ICD-10-CM | POA: Diagnosis not present

## 2014-02-02 DIAGNOSIS — E119 Type 2 diabetes mellitus without complications: Secondary | ICD-10-CM | POA: Diagnosis not present

## 2014-02-03 ENCOUNTER — Encounter (HOSPITAL_COMMUNITY)
Admission: RE | Admit: 2014-02-03 | Discharge: 2014-02-03 | Disposition: A | Discharge status: Home / Self Care | Payer: Self-pay | Source: Ambulatory Visit | Attending: Cardiovascular Disease | Admitting: Cardiovascular Disease

## 2014-02-06 ENCOUNTER — Encounter (HOSPITAL_COMMUNITY)
Admission: RE | Admit: 2014-02-06 | Discharge: 2014-02-06 | Disposition: A | Discharge status: Home / Self Care | Payer: Self-pay | Source: Ambulatory Visit | Attending: Cardiovascular Disease | Admitting: Cardiovascular Disease

## 2014-02-06 DIAGNOSIS — I2510 Atherosclerotic heart disease of native coronary artery without angina pectoris: Secondary | ICD-10-CM | POA: Insufficient documentation

## 2014-02-07 DIAGNOSIS — N201 Calculus of ureter: Secondary | ICD-10-CM | POA: Diagnosis not present

## 2014-02-08 ENCOUNTER — Encounter (HOSPITAL_COMMUNITY)
Admission: RE | Admit: 2014-02-08 | Discharge: 2014-02-08 | Disposition: A | Discharge status: Home / Self Care | Payer: Self-pay | Source: Ambulatory Visit | Attending: Cardiovascular Disease | Admitting: Cardiovascular Disease

## 2014-02-10 ENCOUNTER — Encounter (HOSPITAL_COMMUNITY)
Admission: RE | Admit: 2014-02-10 | Discharge: 2014-02-10 | Disposition: A | Discharge status: Home / Self Care | Payer: Medicare Other | Source: Ambulatory Visit | Attending: Cardiovascular Disease | Admitting: Cardiovascular Disease

## 2014-02-13 ENCOUNTER — Encounter (HOSPITAL_COMMUNITY)
Admission: RE | Admit: 2014-02-13 | Discharge: 2014-02-13 | Disposition: A | Discharge status: Home / Self Care | Payer: Self-pay | Source: Ambulatory Visit | Attending: Cardiovascular Disease | Admitting: Cardiovascular Disease

## 2014-02-15 ENCOUNTER — Encounter (HOSPITAL_COMMUNITY)
Admission: RE | Admit: 2014-02-15 | Discharge: 2014-02-15 | Disposition: A | Discharge status: Home / Self Care | Payer: Self-pay | Source: Ambulatory Visit | Attending: Cardiovascular Disease | Admitting: Cardiovascular Disease

## 2014-02-17 ENCOUNTER — Encounter (HOSPITAL_COMMUNITY)
Admission: RE | Admit: 2014-02-17 | Discharge: 2014-02-17 | Disposition: A | Discharge status: Home / Self Care | Payer: Self-pay | Source: Ambulatory Visit | Attending: Cardiovascular Disease | Admitting: Cardiovascular Disease

## 2014-02-20 ENCOUNTER — Encounter (HOSPITAL_COMMUNITY)
Admission: RE | Admit: 2014-02-20 | Discharge: 2014-02-20 | Disposition: A | Discharge status: Home / Self Care | Payer: Self-pay | Source: Ambulatory Visit | Attending: Cardiovascular Disease | Admitting: Cardiovascular Disease

## 2014-02-22 ENCOUNTER — Encounter (HOSPITAL_COMMUNITY)
Admission: RE | Admit: 2014-02-22 | Discharge: 2014-02-22 | Disposition: A | Discharge status: Home / Self Care | Payer: Self-pay | Source: Ambulatory Visit | Attending: Cardiovascular Disease | Admitting: Cardiovascular Disease

## 2014-02-24 ENCOUNTER — Encounter (HOSPITAL_COMMUNITY)
Admission: RE | Admit: 2014-02-24 | Discharge: 2014-02-24 | Disposition: A | Discharge status: Home / Self Care | Payer: Medicare Other | Source: Ambulatory Visit | Attending: Cardiovascular Disease | Admitting: Cardiovascular Disease

## 2014-02-27 ENCOUNTER — Encounter (HOSPITAL_COMMUNITY)
Admission: RE | Admit: 2014-02-27 | Discharge: 2014-02-27 | Disposition: A | Discharge status: Home / Self Care | Payer: Medicare Other | Source: Ambulatory Visit | Attending: Cardiovascular Disease | Admitting: Cardiovascular Disease

## 2014-03-01 ENCOUNTER — Encounter (HOSPITAL_COMMUNITY)
Admission: RE | Admit: 2014-03-01 | Discharge: 2014-03-01 | Disposition: A | Discharge status: Home / Self Care | Payer: Self-pay | Source: Ambulatory Visit | Attending: Cardiovascular Disease | Admitting: Cardiovascular Disease

## 2014-03-03 ENCOUNTER — Encounter (HOSPITAL_COMMUNITY)
Admission: RE | Admit: 2014-03-03 | Discharge: 2014-03-03 | Disposition: A | Discharge status: Home / Self Care | Payer: Self-pay | Source: Ambulatory Visit | Attending: Cardiovascular Disease | Admitting: Cardiovascular Disease

## 2014-03-06 ENCOUNTER — Encounter (HOSPITAL_COMMUNITY)
Admission: RE | Admit: 2014-03-06 | Discharge: 2014-03-06 | Disposition: A | Discharge status: Home / Self Care | Payer: Self-pay | Source: Ambulatory Visit | Attending: Cardiovascular Disease | Admitting: Cardiovascular Disease

## 2014-03-08 ENCOUNTER — Encounter (HOSPITAL_COMMUNITY): Payer: Self-pay

## 2014-03-10 ENCOUNTER — Encounter (HOSPITAL_COMMUNITY): Payer: Self-pay

## 2014-03-13 ENCOUNTER — Encounter (HOSPITAL_COMMUNITY)
Admission: RE | Admit: 2014-03-13 | Discharge: 2014-03-13 | Disposition: A | Discharge status: Home / Self Care | Payer: Self-pay | Source: Ambulatory Visit | Attending: Cardiovascular Disease | Admitting: Cardiovascular Disease

## 2014-03-13 DIAGNOSIS — Z955 Presence of coronary angioplasty implant and graft: Secondary | ICD-10-CM | POA: Insufficient documentation

## 2014-03-13 DIAGNOSIS — E119 Type 2 diabetes mellitus without complications: Secondary | ICD-10-CM | POA: Insufficient documentation

## 2014-03-13 DIAGNOSIS — I10 Essential (primary) hypertension: Secondary | ICD-10-CM | POA: Insufficient documentation

## 2014-03-13 DIAGNOSIS — E785 Hyperlipidemia, unspecified: Secondary | ICD-10-CM | POA: Insufficient documentation

## 2014-03-13 DIAGNOSIS — I2510 Atherosclerotic heart disease of native coronary artery without angina pectoris: Secondary | ICD-10-CM | POA: Insufficient documentation

## 2014-03-15 ENCOUNTER — Encounter (HOSPITAL_COMMUNITY)
Admission: RE | Admit: 2014-03-15 | Discharge: 2014-03-15 | Disposition: A | Discharge status: Home / Self Care | Payer: Self-pay | Source: Ambulatory Visit | Attending: Cardiovascular Disease | Admitting: Cardiovascular Disease

## 2014-03-17 ENCOUNTER — Encounter (HOSPITAL_COMMUNITY)
Admission: RE | Admit: 2014-03-17 | Discharge: 2014-03-17 | Disposition: A | Discharge status: Home / Self Care | Payer: Self-pay | Source: Ambulatory Visit | Attending: Cardiovascular Disease | Admitting: Cardiovascular Disease

## 2014-03-20 ENCOUNTER — Encounter (HOSPITAL_COMMUNITY)
Admission: RE | Admit: 2014-03-20 | Discharge: 2014-03-20 | Disposition: A | Discharge status: Home / Self Care | Payer: Self-pay | Source: Ambulatory Visit | Attending: Cardiovascular Disease | Admitting: Cardiovascular Disease

## 2014-03-22 ENCOUNTER — Encounter (HOSPITAL_COMMUNITY)
Admission: RE | Admit: 2014-03-22 | Discharge: 2014-03-22 | Disposition: A | Discharge status: Home / Self Care | Payer: Self-pay | Source: Ambulatory Visit | Attending: Cardiovascular Disease | Admitting: Cardiovascular Disease

## 2014-03-24 ENCOUNTER — Encounter (HOSPITAL_COMMUNITY)
Admission: RE | Admit: 2014-03-24 | Discharge: 2014-03-24 | Disposition: A | Discharge status: Home / Self Care | Payer: Self-pay | Source: Ambulatory Visit | Attending: Cardiovascular Disease | Admitting: Cardiovascular Disease

## 2014-03-27 ENCOUNTER — Encounter (HOSPITAL_COMMUNITY)
Admission: RE | Admit: 2014-03-27 | Discharge: 2014-03-27 | Disposition: A | Discharge status: Home / Self Care | Payer: Self-pay | Source: Ambulatory Visit | Attending: Cardiovascular Disease | Admitting: Cardiovascular Disease

## 2014-03-29 ENCOUNTER — Encounter (HOSPITAL_COMMUNITY)
Admission: RE | Admit: 2014-03-29 | Discharge: 2014-03-29 | Disposition: A | Discharge status: Home / Self Care | Payer: Self-pay | Source: Ambulatory Visit | Attending: Cardiovascular Disease | Admitting: Cardiovascular Disease

## 2014-03-31 ENCOUNTER — Encounter (HOSPITAL_COMMUNITY)
Admission: RE | Admit: 2014-03-31 | Discharge: 2014-03-31 | Disposition: A | Discharge status: Home / Self Care | Payer: Self-pay | Source: Ambulatory Visit | Attending: Cardiovascular Disease | Admitting: Cardiovascular Disease

## 2014-04-03 ENCOUNTER — Encounter (HOSPITAL_COMMUNITY)
Admission: RE | Admit: 2014-04-03 | Discharge: 2014-04-03 | Disposition: A | Discharge status: Home / Self Care | Payer: Self-pay | Source: Ambulatory Visit | Attending: Cardiovascular Disease | Admitting: Cardiovascular Disease

## 2014-04-05 ENCOUNTER — Encounter (HOSPITAL_COMMUNITY)
Admission: RE | Admit: 2014-04-05 | Discharge: 2014-04-05 | Disposition: A | Discharge status: Home / Self Care | Payer: Self-pay | Source: Ambulatory Visit | Attending: Cardiovascular Disease | Admitting: Cardiovascular Disease

## 2014-04-07 ENCOUNTER — Encounter (HOSPITAL_COMMUNITY)
Admission: RE | Admit: 2014-04-07 | Discharge: 2014-04-07 | Disposition: A | Discharge status: Home / Self Care | Payer: Self-pay | Source: Ambulatory Visit | Attending: Cardiovascular Disease | Admitting: Cardiovascular Disease

## 2014-04-07 DIAGNOSIS — I10 Essential (primary) hypertension: Secondary | ICD-10-CM | POA: Insufficient documentation

## 2014-04-07 DIAGNOSIS — E785 Hyperlipidemia, unspecified: Secondary | ICD-10-CM | POA: Insufficient documentation

## 2014-04-07 DIAGNOSIS — Z955 Presence of coronary angioplasty implant and graft: Secondary | ICD-10-CM | POA: Insufficient documentation

## 2014-04-07 DIAGNOSIS — I2510 Atherosclerotic heart disease of native coronary artery without angina pectoris: Secondary | ICD-10-CM | POA: Insufficient documentation

## 2014-04-07 DIAGNOSIS — E119 Type 2 diabetes mellitus without complications: Secondary | ICD-10-CM | POA: Insufficient documentation

## 2014-04-10 ENCOUNTER — Encounter (HOSPITAL_COMMUNITY)
Admission: RE | Admit: 2014-04-10 | Discharge: 2014-04-10 | Disposition: A | Discharge status: Home / Self Care | Payer: Self-pay | Source: Ambulatory Visit | Attending: Cardiovascular Disease | Admitting: Cardiovascular Disease

## 2014-04-12 ENCOUNTER — Encounter (HOSPITAL_COMMUNITY)
Admission: RE | Admit: 2014-04-12 | Discharge: 2014-04-12 | Disposition: A | Discharge status: Home / Self Care | Payer: Self-pay | Source: Ambulatory Visit | Attending: Cardiovascular Disease | Admitting: Cardiovascular Disease

## 2014-04-14 ENCOUNTER — Encounter (HOSPITAL_COMMUNITY)
Admission: RE | Admit: 2014-04-14 | Discharge: 2014-04-14 | Disposition: A | Discharge status: Home / Self Care | Payer: Self-pay | Source: Ambulatory Visit | Attending: Cardiovascular Disease | Admitting: Cardiovascular Disease

## 2014-04-17 ENCOUNTER — Encounter (HOSPITAL_COMMUNITY)
Admission: RE | Admit: 2014-04-17 | Discharge: 2014-04-17 | Disposition: A | Discharge status: Home / Self Care | Payer: Self-pay | Source: Ambulatory Visit | Attending: Cardiovascular Disease | Admitting: Cardiovascular Disease

## 2014-04-19 ENCOUNTER — Encounter (HOSPITAL_COMMUNITY)
Admission: RE | Admit: 2014-04-19 | Discharge: 2014-04-19 | Disposition: A | Discharge status: Home / Self Care | Payer: Self-pay | Source: Ambulatory Visit | Attending: Cardiovascular Disease | Admitting: Cardiovascular Disease

## 2014-04-21 ENCOUNTER — Encounter (HOSPITAL_COMMUNITY)
Admission: RE | Admit: 2014-04-21 | Discharge: 2014-04-21 | Disposition: A | Discharge status: Home / Self Care | Payer: Self-pay | Source: Ambulatory Visit | Attending: Cardiovascular Disease | Admitting: Cardiovascular Disease

## 2014-04-24 ENCOUNTER — Encounter (HOSPITAL_COMMUNITY)
Admission: RE | Admit: 2014-04-24 | Discharge: 2014-04-24 | Disposition: A | Discharge status: Home / Self Care | Payer: Self-pay | Source: Ambulatory Visit | Attending: Cardiovascular Disease | Admitting: Cardiovascular Disease

## 2014-04-26 ENCOUNTER — Encounter (HOSPITAL_COMMUNITY)
Admission: RE | Admit: 2014-04-26 | Payer: Self-pay | Source: Ambulatory Visit | Attending: Cardiovascular Disease | Admitting: Cardiovascular Disease

## 2014-04-28 ENCOUNTER — Encounter (HOSPITAL_COMMUNITY): Payer: Self-pay

## 2014-05-01 ENCOUNTER — Encounter (HOSPITAL_COMMUNITY)
Admission: RE | Admit: 2014-05-01 | Discharge: 2014-05-01 | Disposition: A | Discharge status: Home / Self Care | Payer: Self-pay | Source: Ambulatory Visit | Attending: Cardiovascular Disease | Admitting: Cardiovascular Disease

## 2014-05-02 DIAGNOSIS — E6609 Other obesity due to excess calories: Secondary | ICD-10-CM | POA: Diagnosis not present

## 2014-05-02 DIAGNOSIS — J209 Acute bronchitis, unspecified: Secondary | ICD-10-CM | POA: Diagnosis not present

## 2014-05-02 DIAGNOSIS — J069 Acute upper respiratory infection, unspecified: Secondary | ICD-10-CM | POA: Diagnosis not present

## 2014-05-02 DIAGNOSIS — Z6833 Body mass index (BMI) 33.0-33.9, adult: Secondary | ICD-10-CM | POA: Diagnosis not present

## 2014-05-03 ENCOUNTER — Encounter (HOSPITAL_COMMUNITY)
Admission: RE | Admit: 2014-05-03 | Discharge: 2014-05-03 | Disposition: A | Discharge status: Home / Self Care | Payer: Self-pay | Source: Ambulatory Visit | Attending: Cardiovascular Disease | Admitting: Cardiovascular Disease

## 2014-05-04 ENCOUNTER — Ambulatory Visit: Payer: Medicare Other | Admitting: Cardiovascular Disease

## 2014-05-05 ENCOUNTER — Encounter (HOSPITAL_COMMUNITY)
Admission: RE | Admit: 2014-05-05 | Discharge: 2014-05-05 | Disposition: A | Discharge status: Home / Self Care | Payer: Self-pay | Source: Ambulatory Visit | Attending: Cardiovascular Disease | Admitting: Cardiovascular Disease

## 2014-05-08 ENCOUNTER — Encounter (HOSPITAL_COMMUNITY)
Admission: RE | Admit: 2014-05-08 | Discharge: 2014-05-08 | Disposition: A | Discharge status: Home / Self Care | Payer: Self-pay | Source: Ambulatory Visit | Attending: Cardiovascular Disease | Admitting: Cardiovascular Disease

## 2014-05-08 DIAGNOSIS — E119 Type 2 diabetes mellitus without complications: Secondary | ICD-10-CM | POA: Insufficient documentation

## 2014-05-08 DIAGNOSIS — Z955 Presence of coronary angioplasty implant and graft: Secondary | ICD-10-CM | POA: Insufficient documentation

## 2014-05-08 DIAGNOSIS — E785 Hyperlipidemia, unspecified: Secondary | ICD-10-CM | POA: Insufficient documentation

## 2014-05-08 DIAGNOSIS — M1991 Primary osteoarthritis, unspecified site: Secondary | ICD-10-CM | POA: Diagnosis not present

## 2014-05-08 DIAGNOSIS — I2510 Atherosclerotic heart disease of native coronary artery without angina pectoris: Secondary | ICD-10-CM | POA: Insufficient documentation

## 2014-05-08 DIAGNOSIS — E669 Obesity, unspecified: Secondary | ICD-10-CM | POA: Diagnosis not present

## 2014-05-08 DIAGNOSIS — Z6833 Body mass index (BMI) 33.0-33.9, adult: Secondary | ICD-10-CM | POA: Diagnosis not present

## 2014-05-08 DIAGNOSIS — I10 Essential (primary) hypertension: Secondary | ICD-10-CM | POA: Insufficient documentation

## 2014-05-09 ENCOUNTER — Encounter: Payer: Self-pay | Admitting: Cardiovascular Disease

## 2014-05-09 ENCOUNTER — Ambulatory Visit (INDEPENDENT_AMBULATORY_CARE_PROVIDER_SITE_OTHER): Payer: Medicare Other | Admitting: Cardiovascular Disease

## 2014-05-09 VITALS — BP 116/64 | HR 74 | Ht 69.0 in | Wt 249.0 lb

## 2014-05-09 DIAGNOSIS — E119 Type 2 diabetes mellitus without complications: Secondary | ICD-10-CM | POA: Diagnosis not present

## 2014-05-09 DIAGNOSIS — E785 Hyperlipidemia, unspecified: Secondary | ICD-10-CM | POA: Diagnosis not present

## 2014-05-09 DIAGNOSIS — I10 Essential (primary) hypertension: Secondary | ICD-10-CM | POA: Diagnosis not present

## 2014-05-09 DIAGNOSIS — I2510 Atherosclerotic heart disease of native coronary artery without angina pectoris: Secondary | ICD-10-CM
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
## 2014-05-09 NOTE — Patient Instructions (Signed)
Your physician wants you to follow-up in: 1 year Dr Koneswaran You will receive a reminder letter in the mail two months in advance. If you don't receive a letter, please call our office to schedule the follow-up appointment.     Your physician recommends that you continue on your current medications as directed. Please refer to the Current Medication list given to you today.     Thank you for choosing [facility] !        

## 2014-05-10 ENCOUNTER — Encounter (HOSPITAL_COMMUNITY)
Admission: RE | Admit: 2014-05-10 | Discharge: 2014-05-10 | Disposition: A | Discharge status: Home / Self Care | Payer: Self-pay | Source: Ambulatory Visit | Attending: Cardiovascular Disease | Admitting: Cardiovascular Disease

## 2014-05-12 ENCOUNTER — Encounter (HOSPITAL_COMMUNITY)
Admission: RE | Admit: 2014-05-12 | Discharge: 2014-05-12 | Disposition: A | Discharge status: Home / Self Care | Payer: Self-pay | Source: Ambulatory Visit | Attending: Cardiovascular Disease | Admitting: Cardiovascular Disease

## 2014-05-15 ENCOUNTER — Encounter (HOSPITAL_COMMUNITY)
Admission: RE | Admit: 2014-05-15 | Discharge: 2014-05-15 | Disposition: A | Discharge status: Home / Self Care | Payer: Self-pay | Source: Ambulatory Visit | Attending: Cardiovascular Disease | Admitting: Cardiovascular Disease

## 2014-05-17 ENCOUNTER — Encounter (HOSPITAL_COMMUNITY)
Admission: RE | Admit: 2014-05-17 | Discharge: 2014-05-17 | Disposition: A | Discharge status: Home / Self Care | Payer: Self-pay | Source: Ambulatory Visit | Attending: Cardiovascular Disease | Admitting: Cardiovascular Disease

## 2014-05-19 ENCOUNTER — Encounter (HOSPITAL_COMMUNITY)
Admission: RE | Admit: 2014-05-19 | Discharge: 2014-05-19 | Disposition: A | Discharge status: Home / Self Care | Payer: Self-pay | Source: Ambulatory Visit | Attending: Cardiovascular Disease | Admitting: Cardiovascular Disease

## 2014-05-22 ENCOUNTER — Encounter (HOSPITAL_COMMUNITY)
Admission: RE | Admit: 2014-05-22 | Discharge: 2014-05-22 | Disposition: A | Discharge status: Home / Self Care | Payer: Self-pay | Source: Ambulatory Visit | Attending: Cardiovascular Disease | Admitting: Cardiovascular Disease

## 2014-05-24 ENCOUNTER — Encounter (HOSPITAL_COMMUNITY)
Admission: RE | Admit: 2014-05-24 | Discharge: 2014-05-24 | Disposition: A | Discharge status: Home / Self Care | Payer: Self-pay | Source: Ambulatory Visit | Attending: Cardiovascular Disease | Admitting: Cardiovascular Disease

## 2014-05-25 DIAGNOSIS — I10 Essential (primary) hypertension: Secondary | ICD-10-CM | POA: Diagnosis not present

## 2014-05-25 DIAGNOSIS — Z6833 Body mass index (BMI) 33.0-33.9, adult: Secondary | ICD-10-CM | POA: Diagnosis not present

## 2014-05-25 DIAGNOSIS — E6609 Other obesity due to excess calories: Secondary | ICD-10-CM | POA: Diagnosis not present

## 2014-05-25 DIAGNOSIS — Z23 Encounter for immunization: Secondary | ICD-10-CM | POA: Diagnosis not present

## 2014-05-26 ENCOUNTER — Encounter (HOSPITAL_COMMUNITY)
Admission: RE | Admit: 2014-05-26 | Discharge: 2014-05-26 | Disposition: A | Discharge status: Home / Self Care | Payer: Self-pay | Source: Ambulatory Visit | Attending: Cardiovascular Disease | Admitting: Cardiovascular Disease

## 2014-05-29 ENCOUNTER — Encounter (HOSPITAL_COMMUNITY)
Admission: RE | Admit: 2014-05-29 | Discharge: 2014-05-29 | Disposition: A | Discharge status: Home / Self Care | Payer: Self-pay | Source: Ambulatory Visit | Attending: Cardiovascular Disease | Admitting: Cardiovascular Disease

## 2014-05-31 ENCOUNTER — Encounter (HOSPITAL_COMMUNITY)
Admission: RE | Admit: 2014-05-31 | Discharge: 2014-05-31 | Disposition: A | Discharge status: Home / Self Care | Payer: Self-pay | Source: Ambulatory Visit | Attending: Cardiovascular Disease | Admitting: Cardiovascular Disease

## 2014-05-31 DIAGNOSIS — H2513 Age-related nuclear cataract, bilateral: Secondary | ICD-10-CM | POA: Diagnosis not present

## 2014-05-31 DIAGNOSIS — E109 Type 1 diabetes mellitus without complications: Secondary | ICD-10-CM | POA: Diagnosis not present

## 2014-05-31 DIAGNOSIS — Z794 Long term (current) use of insulin: Secondary | ICD-10-CM | POA: Diagnosis not present

## 2014-06-02 ENCOUNTER — Encounter (HOSPITAL_COMMUNITY)
Admission: RE | Admit: 2014-06-02 | Discharge: 2014-06-02 | Disposition: A | Discharge status: Home / Self Care | Payer: Self-pay | Source: Ambulatory Visit | Attending: Cardiovascular Disease | Admitting: Cardiovascular Disease

## 2014-06-05 ENCOUNTER — Encounter (HOSPITAL_COMMUNITY): Payer: Self-pay

## 2014-06-07 ENCOUNTER — Encounter (HOSPITAL_COMMUNITY)
Admission: RE | Admit: 2014-06-07 | Discharge: 2014-06-07 | Disposition: A | Discharge status: Home / Self Care | Payer: Self-pay | Source: Ambulatory Visit | Attending: Cardiovascular Disease | Admitting: Cardiovascular Disease

## 2014-06-07 DIAGNOSIS — I10 Essential (primary) hypertension: Secondary | ICD-10-CM | POA: Insufficient documentation

## 2014-06-07 DIAGNOSIS — I2510 Atherosclerotic heart disease of native coronary artery without angina pectoris: Secondary | ICD-10-CM | POA: Insufficient documentation

## 2014-06-07 DIAGNOSIS — E119 Type 2 diabetes mellitus without complications: Secondary | ICD-10-CM | POA: Insufficient documentation

## 2014-06-07 DIAGNOSIS — E785 Hyperlipidemia, unspecified: Secondary | ICD-10-CM | POA: Insufficient documentation

## 2014-06-07 DIAGNOSIS — Z955 Presence of coronary angioplasty implant and graft: Secondary | ICD-10-CM | POA: Insufficient documentation

## 2014-06-09 ENCOUNTER — Encounter (HOSPITAL_COMMUNITY)
Admission: RE | Admit: 2014-06-09 | Discharge: 2014-06-09 | Disposition: A | Discharge status: Home / Self Care | Payer: Self-pay | Source: Ambulatory Visit | Attending: Cardiovascular Disease | Admitting: Cardiovascular Disease

## 2014-06-12 ENCOUNTER — Encounter (HOSPITAL_COMMUNITY)
Admission: RE | Admit: 2014-06-12 | Discharge: 2014-06-12 | Disposition: A | Discharge status: Home / Self Care | Payer: Self-pay | Source: Ambulatory Visit | Attending: Cardiovascular Disease | Admitting: Cardiovascular Disease

## 2014-06-14 ENCOUNTER — Encounter (HOSPITAL_COMMUNITY)
Admission: RE | Admit: 2014-06-14 | Discharge: 2014-06-14 | Disposition: A | Discharge status: Home / Self Care | Payer: Self-pay | Source: Ambulatory Visit | Attending: Cardiovascular Disease | Admitting: Cardiovascular Disease

## 2014-06-16 ENCOUNTER — Encounter (HOSPITAL_COMMUNITY)
Admission: RE | Admit: 2014-06-16 | Discharge: 2014-06-16 | Disposition: A | Discharge status: Home / Self Care | Payer: Self-pay | Source: Ambulatory Visit | Attending: Cardiovascular Disease | Admitting: Cardiovascular Disease

## 2014-06-19 ENCOUNTER — Encounter (HOSPITAL_COMMUNITY)
Admission: RE | Admit: 2014-06-19 | Discharge: 2014-06-19 | Disposition: A | Discharge status: Home / Self Care | Payer: Self-pay | Source: Ambulatory Visit | Attending: Cardiovascular Disease | Admitting: Cardiovascular Disease

## 2014-06-21 ENCOUNTER — Encounter (HOSPITAL_COMMUNITY)
Admission: RE | Admit: 2014-06-21 | Discharge: 2014-06-21 | Disposition: A | Discharge status: Home / Self Care | Payer: Self-pay | Source: Ambulatory Visit | Attending: Cardiovascular Disease | Admitting: Cardiovascular Disease

## 2014-06-23 ENCOUNTER — Encounter (HOSPITAL_COMMUNITY)
Admission: RE | Admit: 2014-06-23 | Discharge: 2014-06-23 | Disposition: A | Discharge status: Home / Self Care | Payer: Self-pay | Source: Ambulatory Visit | Attending: Cardiovascular Disease | Admitting: Cardiovascular Disease

## 2014-06-26 ENCOUNTER — Encounter (HOSPITAL_COMMUNITY)
Admission: RE | Admit: 2014-06-26 | Discharge: 2014-06-26 | Disposition: A | Discharge status: Home / Self Care | Payer: Self-pay | Source: Ambulatory Visit | Attending: Cardiovascular Disease | Admitting: Cardiovascular Disease

## 2014-06-28 ENCOUNTER — Encounter (HOSPITAL_COMMUNITY)
Admission: RE | Admit: 2014-06-28 | Discharge: 2014-06-28 | Disposition: A | Discharge status: Home / Self Care | Payer: Self-pay | Source: Ambulatory Visit | Attending: Cardiovascular Disease | Admitting: Cardiovascular Disease

## 2014-06-30 ENCOUNTER — Encounter (HOSPITAL_COMMUNITY)
Admission: RE | Admit: 2014-06-30 | Discharge: 2014-06-30 | Disposition: A | Discharge status: Home / Self Care | Payer: Self-pay | Source: Ambulatory Visit | Attending: Cardiovascular Disease | Admitting: Cardiovascular Disease

## 2014-07-03 ENCOUNTER — Encounter (HOSPITAL_COMMUNITY)
Admission: RE | Admit: 2014-07-03 | Discharge: 2014-07-03 | Disposition: A | Discharge status: Home / Self Care | Payer: Self-pay | Source: Ambulatory Visit | Attending: Cardiovascular Disease | Admitting: Cardiovascular Disease

## 2014-07-05 ENCOUNTER — Encounter (HOSPITAL_COMMUNITY)
Admission: RE | Admit: 2014-07-05 | Discharge: 2014-07-05 | Disposition: A | Discharge status: Home / Self Care | Payer: Self-pay | Source: Ambulatory Visit | Attending: Cardiovascular Disease | Admitting: Cardiovascular Disease

## 2014-07-07 ENCOUNTER — Encounter (HOSPITAL_COMMUNITY): Payer: Self-pay

## 2014-07-10 ENCOUNTER — Encounter (HOSPITAL_COMMUNITY): Payer: Self-pay

## 2014-07-12 ENCOUNTER — Encounter (HOSPITAL_COMMUNITY): Payer: Self-pay

## 2014-07-14 ENCOUNTER — Encounter (HOSPITAL_COMMUNITY): Payer: Self-pay

## 2014-07-17 ENCOUNTER — Encounter (HOSPITAL_COMMUNITY): Payer: Self-pay

## 2014-07-18 ENCOUNTER — Other Ambulatory Visit: Payer: Self-pay | Admitting: Physician Assistant

## 2014-07-19 ENCOUNTER — Encounter (HOSPITAL_COMMUNITY): Payer: Self-pay

## 2014-07-25 DIAGNOSIS — H2512 Age-related nuclear cataract, left eye: Secondary | ICD-10-CM | POA: Diagnosis not present

## 2014-07-25 DIAGNOSIS — I10 Essential (primary) hypertension: Secondary | ICD-10-CM | POA: Diagnosis not present

## 2014-07-25 DIAGNOSIS — Z125 Encounter for screening for malignant neoplasm of prostate: Secondary | ICD-10-CM | POA: Diagnosis not present

## 2014-07-25 DIAGNOSIS — Z6833 Body mass index (BMI) 33.0-33.9, adult: Secondary | ICD-10-CM | POA: Diagnosis not present

## 2014-07-25 DIAGNOSIS — E6609 Other obesity due to excess calories: Secondary | ICD-10-CM | POA: Diagnosis not present

## 2014-07-25 DIAGNOSIS — Z23 Encounter for immunization: Secondary | ICD-10-CM | POA: Diagnosis not present

## 2014-07-25 DIAGNOSIS — E119 Type 2 diabetes mellitus without complications: Secondary | ICD-10-CM | POA: Diagnosis not present

## 2014-07-26 DIAGNOSIS — H2512 Age-related nuclear cataract, left eye: Secondary | ICD-10-CM | POA: Diagnosis not present

## 2014-08-09 DIAGNOSIS — H2512 Age-related nuclear cataract, left eye: Secondary | ICD-10-CM | POA: Diagnosis not present

## 2014-08-10 ENCOUNTER — Other Ambulatory Visit (HOSPITAL_COMMUNITY): Payer: Self-pay | Admitting: Internal Medicine

## 2014-08-10 DIAGNOSIS — R9389 Abnormal findings on diagnostic imaging of other specified body structures: Secondary | ICD-10-CM

## 2014-08-10 DIAGNOSIS — E6609 Other obesity due to excess calories: Secondary | ICD-10-CM | POA: Diagnosis not present

## 2014-08-10 DIAGNOSIS — Z87898 Personal history of other specified conditions: Secondary | ICD-10-CM

## 2014-08-10 DIAGNOSIS — F419 Anxiety disorder, unspecified: Secondary | ICD-10-CM | POA: Diagnosis not present

## 2014-08-10 DIAGNOSIS — E1129 Type 2 diabetes mellitus with other diabetic kidney complication: Secondary | ICD-10-CM | POA: Diagnosis not present

## 2014-08-10 DIAGNOSIS — Z6832 Body mass index (BMI) 32.0-32.9, adult: Secondary | ICD-10-CM | POA: Diagnosis not present

## 2014-08-10 DIAGNOSIS — I10 Essential (primary) hypertension: Secondary | ICD-10-CM | POA: Diagnosis not present

## 2014-08-10 DIAGNOSIS — G894 Chronic pain syndrome: Secondary | ICD-10-CM | POA: Diagnosis not present

## 2014-08-10 DIAGNOSIS — Z1389 Encounter for screening for other disorder: Secondary | ICD-10-CM | POA: Diagnosis not present

## 2014-08-10 DIAGNOSIS — E782 Mixed hyperlipidemia: Secondary | ICD-10-CM | POA: Diagnosis not present

## 2014-08-10 DIAGNOSIS — M1991 Primary osteoarthritis, unspecified site: Secondary | ICD-10-CM | POA: Diagnosis not present

## 2014-08-10 DIAGNOSIS — E669 Obesity, unspecified: Secondary | ICD-10-CM | POA: Diagnosis not present

## 2014-08-10 DIAGNOSIS — E1140 Type 2 diabetes mellitus with diabetic neuropathy, unspecified: Secondary | ICD-10-CM | POA: Diagnosis not present

## 2014-08-14 ENCOUNTER — Ambulatory Visit (HOSPITAL_COMMUNITY)
Admission: RE | Admit: 2014-08-14 | Discharge: 2014-08-14 | Disposition: A | Discharge status: Home / Self Care | Payer: Medicare Other | Source: Ambulatory Visit | Attending: Internal Medicine | Admitting: Internal Medicine

## 2014-08-14 DIAGNOSIS — R911 Solitary pulmonary nodule: Secondary | ICD-10-CM | POA: Insufficient documentation

## 2014-08-14 DIAGNOSIS — R9389 Abnormal findings on diagnostic imaging of other specified body structures: Secondary | ICD-10-CM

## 2014-08-14 DIAGNOSIS — Z87898 Personal history of other specified conditions: Secondary | ICD-10-CM

## 2014-08-15 DIAGNOSIS — H2511 Age-related nuclear cataract, right eye: Secondary | ICD-10-CM | POA: Diagnosis not present

## 2014-08-16 DIAGNOSIS — R3915 Urgency of urination: Secondary | ICD-10-CM | POA: Diagnosis not present

## 2014-08-16 DIAGNOSIS — R972 Elevated prostate specific antigen [PSA]: Secondary | ICD-10-CM | POA: Diagnosis not present

## 2014-08-23 DIAGNOSIS — H2181 Floppy iris syndrome: Secondary | ICD-10-CM | POA: Diagnosis not present

## 2014-08-23 DIAGNOSIS — H21561 Pupillary abnormality, right eye: Secondary | ICD-10-CM | POA: Diagnosis not present

## 2014-08-23 DIAGNOSIS — H2511 Age-related nuclear cataract, right eye: Secondary | ICD-10-CM | POA: Diagnosis not present

## 2014-09-13 DIAGNOSIS — R972 Elevated prostate specific antigen [PSA]: Secondary | ICD-10-CM | POA: Diagnosis not present

## 2014-09-19 DIAGNOSIS — R3919 Other difficulties with micturition: Secondary | ICD-10-CM | POA: Diagnosis not present

## 2014-09-19 DIAGNOSIS — R972 Elevated prostate specific antigen [PSA]: Secondary | ICD-10-CM | POA: Diagnosis not present

## 2014-09-19 DIAGNOSIS — E1129 Type 2 diabetes mellitus with other diabetic kidney complication: Secondary | ICD-10-CM | POA: Diagnosis not present

## 2014-09-21 DIAGNOSIS — I10 Essential (primary) hypertension: Secondary | ICD-10-CM | POA: Diagnosis not present

## 2014-09-21 DIAGNOSIS — E1122 Type 2 diabetes mellitus with diabetic chronic kidney disease: Secondary | ICD-10-CM | POA: Diagnosis not present

## 2014-09-21 DIAGNOSIS — E785 Hyperlipidemia, unspecified: Secondary | ICD-10-CM | POA: Diagnosis not present

## 2014-09-21 DIAGNOSIS — N183 Chronic kidney disease, stage 3 (moderate): Secondary | ICD-10-CM | POA: Diagnosis not present

## 2014-10-03 DIAGNOSIS — Z1389 Encounter for screening for other disorder: Secondary | ICD-10-CM | POA: Diagnosis not present

## 2014-10-03 DIAGNOSIS — E669 Obesity, unspecified: Secondary | ICD-10-CM | POA: Diagnosis not present

## 2014-10-03 DIAGNOSIS — E118 Type 2 diabetes mellitus with unspecified complications: Secondary | ICD-10-CM | POA: Diagnosis not present

## 2014-10-03 DIAGNOSIS — E782 Mixed hyperlipidemia: Secondary | ICD-10-CM | POA: Diagnosis not present

## 2014-10-03 DIAGNOSIS — R600 Localized edema: Secondary | ICD-10-CM | POA: Diagnosis not present

## 2014-10-03 DIAGNOSIS — Z6834 Body mass index (BMI) 34.0-34.9, adult: Secondary | ICD-10-CM | POA: Diagnosis not present

## 2014-10-03 DIAGNOSIS — Z23 Encounter for immunization: Secondary | ICD-10-CM | POA: Diagnosis not present

## 2014-10-03 DIAGNOSIS — E6609 Other obesity due to excess calories: Secondary | ICD-10-CM | POA: Diagnosis not present

## 2014-10-03 DIAGNOSIS — I10 Essential (primary) hypertension: Secondary | ICD-10-CM | POA: Diagnosis not present

## 2014-10-03 DIAGNOSIS — E0590 Thyrotoxicosis, unspecified without thyrotoxic crisis or storm: Secondary | ICD-10-CM | POA: Diagnosis not present

## 2014-10-05 DIAGNOSIS — Z79899 Other long term (current) drug therapy: Secondary | ICD-10-CM | POA: Diagnosis not present

## 2014-10-05 DIAGNOSIS — D649 Anemia, unspecified: Secondary | ICD-10-CM | POA: Diagnosis not present

## 2014-10-20 ENCOUNTER — Other Ambulatory Visit: Payer: Self-pay

## 2014-10-20 MED ORDER — METOPROLOL TARTRATE 25 MG PO TABS: 25.0000 mg | ORAL_TABLET | Freq: Every day | ORAL | Status: DC

## 2014-10-20 NOTE — Telephone Encounter (Signed)
Refill complete 

## 2014-10-21 ENCOUNTER — Emergency Department (HOSPITAL_COMMUNITY): Payer: No Typology Code available for payment source

## 2014-10-21 ENCOUNTER — Emergency Department (HOSPITAL_COMMUNITY)
Admission: EM | Admit: 2014-10-21 | Discharge: 2014-10-22 | Disposition: A | Discharge status: Home / Self Care | Payer: No Typology Code available for payment source | Attending: Emergency Medicine | Admitting: Emergency Medicine

## 2014-10-21 ENCOUNTER — Encounter (HOSPITAL_COMMUNITY): Payer: Self-pay | Admitting: Emergency Medicine

## 2014-10-21 DIAGNOSIS — S20219A Contusion of unspecified front wall of thorax, initial encounter: Secondary | ICD-10-CM | POA: Diagnosis not present

## 2014-10-21 DIAGNOSIS — Y998 Other external cause status: Secondary | ICD-10-CM | POA: Insufficient documentation

## 2014-10-21 DIAGNOSIS — S301XXA Contusion of abdominal wall, initial encounter: Secondary | ICD-10-CM | POA: Insufficient documentation

## 2014-10-21 DIAGNOSIS — Z794 Long term (current) use of insulin: Secondary | ICD-10-CM | POA: Diagnosis not present

## 2014-10-21 DIAGNOSIS — Z7982 Long term (current) use of aspirin: Secondary | ICD-10-CM | POA: Insufficient documentation

## 2014-10-21 DIAGNOSIS — Z87891 Personal history of nicotine dependence: Secondary | ICD-10-CM | POA: Diagnosis not present

## 2014-10-21 DIAGNOSIS — Y9389 Activity, other specified: Secondary | ICD-10-CM | POA: Insufficient documentation

## 2014-10-21 DIAGNOSIS — Z87442 Personal history of urinary calculi: Secondary | ICD-10-CM | POA: Diagnosis not present

## 2014-10-21 DIAGNOSIS — Z79899 Other long term (current) drug therapy: Secondary | ICD-10-CM | POA: Insufficient documentation

## 2014-10-21 DIAGNOSIS — E7800 Pure hypercholesterolemia, unspecified: Secondary | ICD-10-CM | POA: Diagnosis not present

## 2014-10-21 DIAGNOSIS — N23 Unspecified renal colic: Secondary | ICD-10-CM | POA: Diagnosis not present

## 2014-10-21 DIAGNOSIS — Y92410 Unspecified street and highway as the place of occurrence of the external cause: Secondary | ICD-10-CM | POA: Insufficient documentation

## 2014-10-21 DIAGNOSIS — Z9889 Other specified postprocedural states: Secondary | ICD-10-CM | POA: Diagnosis not present

## 2014-10-21 DIAGNOSIS — E119 Type 2 diabetes mellitus without complications: Secondary | ICD-10-CM | POA: Insufficient documentation

## 2014-10-21 DIAGNOSIS — Z791 Long term (current) use of non-steroidal anti-inflammatories (NSAID): Secondary | ICD-10-CM | POA: Diagnosis not present

## 2014-10-21 DIAGNOSIS — I10 Essential (primary) hypertension: Secondary | ICD-10-CM | POA: Diagnosis not present

## 2014-10-21 DIAGNOSIS — R109 Unspecified abdominal pain: Secondary | ICD-10-CM

## 2014-10-21 DIAGNOSIS — Z7984 Long term (current) use of oral hypoglycemic drugs: Secondary | ICD-10-CM | POA: Diagnosis not present

## 2014-10-21 DIAGNOSIS — N201 Calculus of ureter: Secondary | ICD-10-CM | POA: Diagnosis not present

## 2014-10-21 DIAGNOSIS — Z7902 Long term (current) use of antithrombotics/antiplatelets: Secondary | ICD-10-CM | POA: Diagnosis not present

## 2014-10-21 DIAGNOSIS — R1012 Left upper quadrant pain: Secondary | ICD-10-CM | POA: Diagnosis not present

## 2014-10-21 DIAGNOSIS — R079 Chest pain, unspecified: Secondary | ICD-10-CM | POA: Diagnosis not present

## 2014-10-21 DIAGNOSIS — S3992XA Unspecified injury of lower back, initial encounter: Secondary | ICD-10-CM | POA: Insufficient documentation

## 2014-10-21 DIAGNOSIS — N132 Hydronephrosis with renal and ureteral calculous obstruction: Secondary | ICD-10-CM | POA: Diagnosis not present

## 2014-10-21 DIAGNOSIS — S3991XA Unspecified injury of abdomen, initial encounter: Secondary | ICD-10-CM | POA: Diagnosis present

## 2014-10-21 HISTORY — DX: Calculus of kidney: N20.0

## 2014-10-21 LAB — I-STAT CHEM 8, ED
BUN: 27 mg/dL — ABNORMAL HIGH (ref 6–20)
Calcium, Ion: 1.22 mmol/L (ref 1.13–1.30)
Chloride: 98 mmol/L — ABNORMAL LOW (ref 101–111)
Creatinine, Ser: 2 mg/dL — ABNORMAL HIGH (ref 0.61–1.24)
Glucose, Bld: 260 mg/dL — ABNORMAL HIGH (ref 65–99)
HCT: 41 % (ref 39.0–52.0)
HEMOGLOBIN: 13.9 g/dL (ref 13.0–17.0)
Potassium: 4.7 mmol/L (ref 3.5–5.1)
Sodium: 136 mmol/L (ref 135–145)
TCO2: 25 mmol/L (ref 0–100)

## 2014-10-21 MED ORDER — MORPHINE SULFATE (PF) 4 MG/ML IV SOLN
4.0000 mg | INTRAVENOUS | Status: AC | PRN
  Administered 2014-10-21 (×2): 4 mg via INTRAVENOUS
  Filled 2014-10-21 (×2): qty 1

## 2014-10-21 MED ORDER — SODIUM CHLORIDE 0.9 % IV BOLUS (SEPSIS)
500.0000 mL | Freq: Once | INTRAVENOUS | Status: AC
  Administered 2014-10-21: 500 mL via INTRAVENOUS

## 2014-10-21 MED ORDER — ONDANSETRON HCL 4 MG/2ML IJ SOLN
4.0000 mg | INTRAMUSCULAR | Status: DC | PRN
  Administered 2014-10-21: 4 mg via INTRAVENOUS
  Filled 2014-10-21: qty 2

## 2014-10-21 MED ORDER — IOHEXOL 300 MG/ML  SOLN
100.0000 mL | Freq: Once | INTRAMUSCULAR | Status: AC | PRN
  Administered 2014-10-21: 100 mL via INTRAVENOUS

## 2014-10-21 MED ORDER — SODIUM CHLORIDE 0.9 % IV SOLN
INTRAVENOUS | Status: DC
  Administered 2014-10-21: 22:00:00 via INTRAVENOUS

## 2014-10-21 NOTE — ED Notes (Signed)
Verbal Ok given by Md to administer next dose of morphine as pt continues to be in pain
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
## 2014-10-21 NOTE — ED Notes (Signed)
Pt c/o left flank pain. Pt denies any other symptoms. Pt states he has taken 3 oxycodones today. Pt also states he has had similar pain when he has had kidney stones in the past.

## 2014-10-22 LAB — URINALYSIS, ROUTINE W REFLEX MICROSCOPIC
Bilirubin Urine: NEGATIVE
Glucose, UA: NEGATIVE mg/dL
Ketones, ur: NEGATIVE mg/dL
LEUKOCYTES UA: NEGATIVE
NITRITE: NEGATIVE
PH: 5 (ref 5.0–8.0)
Protein, ur: NEGATIVE mg/dL
SPECIFIC GRAVITY, URINE: 1.015 (ref 1.005–1.030)
Urobilinogen, UA: 0.2 mg/dL (ref 0.0–1.0)

## 2014-10-22 LAB — URINE MICROSCOPIC-ADD ON

## 2014-10-22 MED ORDER — ONDANSETRON HCL 4 MG PO TABS: 4.0000 mg | ORAL_TABLET | Freq: Three times a day (TID) | ORAL | Status: DC | PRN

## 2014-10-22 MED ORDER — OXYCODONE-ACETAMINOPHEN 5-325 MG PO TABS: ORAL_TABLET | ORAL | Status: DC

## 2014-10-22 NOTE — ED Notes (Signed)
Patient verbalizes understanding of discharge instructions, prescription medications, home care and follow up care. Patient ambulatory out of department at this time with family. 

## 2014-10-22 NOTE — Discharge Instructions (Signed)
°Emergency Department Resource Guide °1) Find a Doctor and Pay Out of Pocket °Although you won't have to find out who is covered by your insurance plan, it is a good idea to ask around and get recommendations. You will then need to call the office and see if the doctor you have chosen will accept you as a new patient and what types of options they offer for patients who are self-pay. Some doctors offer discounts or will set up payment plans for their patients who do not have insurance, but you will need to ask so you aren't surprised when you get to your appointment. ° °2) Contact Your Local Health Department °Not all health departments have doctors that can see patients for sick visits, but many do, so it is worth a call to see if yours does. If you don't know where your local health department is, you can check in your phone book. The CDC also has a tool to help you locate your state's health department, and many state websites also have listings of all of their local health departments. ° °3) Find a Walk-in Clinic °If your illness is not likely to be very severe or complicated, you may want to try a walk in clinic. These are popping up all over the country in pharmacies, drugstores, and shopping centers. They're usually staffed by nurse practitioners or physician assistants that have been trained to treat common illnesses and complaints. They're usually fairly quick and inexpensive. However, if you have serious medical issues or chronic medical problems, these are probably not your best option. ° °No Primary Care Doctor: °- Call [facility] at  832-8000 - they can help you locate a primary care doctor that  accepts your insurance, provides certain services, etc. °- Physician Referral Service- 1-800-533-3463 ° °Chronic Pain Problems: °Organization         Address  Phone   Notes  °[facility]  (336) 297-2271 Patients need to be referred by their primary care doctor.  ° °Medication  Assistance: °Organization         Address  Phone   Notes  °[place] [facility] [address] °[place], [place] [address] (336) 641-8030 --Must be a resident of [place] °-- Must have NO insurance coverage whatsoever (no Medicaid/ Medicare, etc.) °-- The pt. MUST have a primary care doctor that directs their care regularly and follows them in the community °  °[facility]  (866) 331-1348   °[employer]  (888) 892-1162   ° °Agencies that provide inexpensive medical care: °Organization         Address  Phone   Notes  °[facility]  (336) 832-8035   ° Internal Medicine    (336) 832-7272   °[facility] [address] °[place], [place] [address] (336) 832-4777   °[facility] [address], °[place] (336) 271-4999   °[facility]    (336) 373-0678   °[facility]    (336) 272-1050   °[facility] ° [address], [place] Phone:  (336) 832-4444, Fax:  (336) 832-4440 Hours of Operation:  9 am - 6 pm, M-F.  Also accepts Medicaid/Medicare and self-pay.  °[facility] ° [address], [place] Phone: (336) 832-3150, Fax: (336) [phone]. Hours of Operation:  8:30 am - 5:30 pm, M-F.  Also accepts Medicaid and self-pay.  °[facility] [place] [address], [place] Phone: (336) 878-6027   °[facility] [address], [place], [place] (336)723-1848, Ext. 123 Mondays & Thursdays: 7-9 AM.  First 15 patients are seen on a first come, first serve basis. °  ° °Medicaid-accepting [place] Providers: ° °Organization         Address  Phone   Notes  °[facility] [address], [place] (336) 641-2100 Also accepts self-pay patients.  °[facility] [address], [place] ° (336) 856-9996   °[facility] [address], [place]  (336) 288-8857   °[facility] [address] [place] [address], [place] (336) 299-7000   °Veita Bland [address], [place]  ° (336) 373-1557 Only accepts [place] Access Medicaid patients after they have their name applied to their card.  ° °Self-Pay (no insurance) in [place]: ° °Organization         Address  Phone   Notes  °Sickle Cell Patients, [facility] [address], [place] (336) 832-1970   °[facility] [address], [place] (336) 832-4400   °[facility] ° [address], [place] (336) 992-4800   °[facility]/Dr. Osei-Bonsu ° [address] [place] [address], [place] or [address], [place] (336) 841-8500 Phone number for both [place] and [place] locations is the same.  °[facility] [address], [place] (336) 299-0000   °[facility] [address] [place] [address], [place] or [address] (336) 852-7530 °(336) 878-2260   °[facility] [address], [place] (336) 350-1642, phone; (336) 294-5005, fax Sees patients 1st and 3rd Saturday of every month.  Must not qualify for public or private insurance (i.e. Medicaid, Medicare, [place] Health Choice, Veterans' Benefits) • Household income should be no more than 200% of the poverty level •The clinic cannot treat you if you are pregnant or think you are pregnant • Sexually transmitted diseases are not treated at the clinic.  ° ° °Dental Care: °Organization         Address  Phone  Notes  °[place] [facility] [address],  (336) 641-6152 Accepts children up to age 21 who are enrolled in Medicaid or [place] Health Choice; pregnant women with a Medicaid card; and children who have applied for Medicaid or [place] Health Choice, but were declined, whose parents can pay a reduced fee at time of service.  °[place]  [facility] [place]  [address], [place] (336) 641-7733 Accepts children up to age 21 who are enrolled in Medicaid or [place] Health Choice; pregnant women with a Medicaid card; and children who have applied for Medicaid or [place] Health Choice, but were declined, whose parents can pay a reduced fee at time of service.  °[facility] ° [address],  (336) 641-4533 Patients are seen by appointment only. Walk-ins are not accepted. [facility] will see patients 18 years of age and older. °Monday - Tuesday (8am-5pm) °Most Wednesdays (8:30-5pm) °$30 per visit, cash only  °[facility] ° [address], [place] (336) 641-4533 Patients are seen by appointment only. Walk-ins are not accepted. [facility] will see patients 18 years of age and older. °One   Wednesday Evening (Monthly: Volunteer Based).  $30 per visit, cash only  °[facility]  (919) 537-3737 for adults; Children under age 4, call Graduate Pediatric Dentistry at ([phone]) [phone]. Children aged 4-14, please call (919) [phone] to request a pediatric application. ° Dental services are provided in all areas of dental care including fillings, crowns and bridges, complete and partial dentures, implants, gum treatment, root canals, and extractions. Preventive care is also provided. Treatment is provided to both adults and children. °Patients are selected via a lottery and there is often a waiting list. °  °[facility] [address], °[place] ° (336) 763-8833 [email or web address] °  °[facility] [address], [place], [place] (336)723-1848, Ext. 123 Second and Fourth Thursday of each month, opens at 6:30 AM; Clinic ends at 9 AM.  Patients are seen on a first-come first-served basis, and a limited number are seen during each clinic.  ° °[facility] ° [address], [place], [place] (336) 723-7904    Eligibility Requirements °You must have lived in [place], [place], or [place] counties for at least the last three months. °  You cannot be eligible for state or federal sponsored healthcare insurance, including Veterans Administration, Medicaid, or Medicare. °  You generally cannot be eligible for healthcare insurance through your employer.  °  How to apply: °Eligibility screenings are held every Tuesday and Wednesday afternoon from 1:00 pm until 4:00 pm. You do not need an appointment for the interview!  °[facility] [address], [place], [place] 336-631-2330   °[facility]  336-342-8273   °[facility]  336-703-3100   ° [facility]  336-570-6415   ° °Behavioral Health Resources in the Community: °Intensive Outpatient Programs °Organization         Address  Phone  Notes  °[facility] [address], [place], [place] 336-878-6098   °[facility] [address], [place], [place] 336-832-9800   °[facility] [address], [place], [place] ° 336-882-2125   °[facility] [address],  °[place], [place] 1-800-853-5163 or 336-641-4981   °Substance Abuse Resources °Organization         Address  Phone  Notes  °[facility]  336-882-2125   °[facility]  336-784-9470   °[facility]  336-285-9073   °[facility]  336-845-3988   °Residential & Outpatient Substance Abuse Program  1-800-659-3381   °Psychological Services °Organization         Address  Phone  Notes  °[facility]  336- 832-9600   °[facility]  336- 378-7881   °[facility] [address], [place] 1-800-853-5163 or 336-641-4981   ° °Mobile Crisis Teams °Organization         Address  Phone  Notes  °[facility], Mobile Crisis Care Unit  1-877-626-1772   °Assertive °[facility] ° [address]  [place], [place] 336-834-9664   °Sharon DeEsch [address] °[place] 336-554-5454   ° °Self-Help/Support Groups °Organization         Address  Phone             Notes  °[facility] - variety of support groups  336- 373-1402 Call for more information  °Narcotics Anonymous (NA), [facility] [address], °[place]  2 meetings at this location  ° °  Residential Treatment Programs Organization         Address  Phone  Notes  [facility] [address] [place] [address],    [place]  1-(612) 498-4094   [facility]  [address] [place] [address]88, [place], [place]   [place], [place] [phone] Admissions: 8am-3pm M-F  [facility] [address],    [place], [place] 280-034-9179   [facility] [address] [place], [place], [place]   [facility] [address],  [place], [place]   [facility] - Intensive Outpatient [address], [place], [place]   [facility] [address]  [place], [place] 1-641-463-0497 or 669-499-0202   [facility] [address], [place], [place] Accepts Medicaid  [facility] [address]  [place] 1-(920)158-9559 Substance Abuse/Addiction Treatment   [facility] Organization         Address  Phone  Notes  [facility]  734-170-6437   Domenic Schwab, PhD [address] Arlis Porta [place], [place]   (336)865-1103 or 713-227-0715   [facility] [place] [address] [place], [place] 276-030-9161   [facility] [address], [place], [place] (813)471-2391 Insurance/Medicaid/sponsorship through [facility] and Families [address]                                    [place], [place] [phone] [place] [address], [place] [phone]    Dr. Adele Schilder  [phone]   [facility] Dept. 1) [address], [place] 2) [place] 3)  [address], [place] [phone] [phone]  [phone]   [place] [phone] or [phone] (After Hours)      Stop taking the hydrocodone for pain, and take the new prescriptions as directed.  Continue to take your flomax as previously directed. Call the Urologist on Monday to schedule a follow up appointment in the next 3 days.  Return to the Emergency Department immediately if worsening.

## 2014-10-26 DIAGNOSIS — E785 Hyperlipidemia, unspecified: Secondary | ICD-10-CM | POA: Diagnosis not present

## 2014-10-26 DIAGNOSIS — N179 Acute kidney failure, unspecified: Secondary | ICD-10-CM | POA: Diagnosis not present

## 2014-10-26 DIAGNOSIS — E875 Hyperkalemia: Secondary | ICD-10-CM | POA: Diagnosis not present

## 2014-10-26 DIAGNOSIS — Z6834 Body mass index (BMI) 34.0-34.9, adult: Secondary | ICD-10-CM | POA: Diagnosis not present

## 2014-10-26 DIAGNOSIS — E6609 Other obesity due to excess calories: Secondary | ICD-10-CM | POA: Diagnosis not present

## 2014-10-26 DIAGNOSIS — N183 Chronic kidney disease, stage 3 (moderate): Secondary | ICD-10-CM | POA: Diagnosis not present

## 2014-10-26 DIAGNOSIS — E559 Vitamin D deficiency, unspecified: Secondary | ICD-10-CM | POA: Diagnosis not present

## 2014-10-26 DIAGNOSIS — G894 Chronic pain syndrome: Secondary | ICD-10-CM | POA: Diagnosis not present

## 2014-10-26 DIAGNOSIS — I10 Essential (primary) hypertension: Secondary | ICD-10-CM | POA: Diagnosis not present

## 2014-10-26 DIAGNOSIS — Z1389 Encounter for screening for other disorder: Secondary | ICD-10-CM | POA: Diagnosis not present

## 2014-10-26 DIAGNOSIS — E1122 Type 2 diabetes mellitus with diabetic chronic kidney disease: Secondary | ICD-10-CM | POA: Diagnosis not present

## 2014-11-14 DIAGNOSIS — N202 Calculus of kidney with calculus of ureter: Secondary | ICD-10-CM | POA: Diagnosis not present

## 2014-11-14 DIAGNOSIS — Z1389 Encounter for screening for other disorder: Secondary | ICD-10-CM | POA: Diagnosis not present

## 2014-11-14 DIAGNOSIS — E1129 Type 2 diabetes mellitus with other diabetic kidney complication: Secondary | ICD-10-CM | POA: Diagnosis not present

## 2014-11-14 DIAGNOSIS — S20219D Contusion of unspecified front wall of thorax, subsequent encounter: Secondary | ICD-10-CM | POA: Diagnosis not present

## 2014-11-14 DIAGNOSIS — I10 Essential (primary) hypertension: Secondary | ICD-10-CM | POA: Diagnosis not present

## 2014-11-14 DIAGNOSIS — E782 Mixed hyperlipidemia: Secondary | ICD-10-CM | POA: Diagnosis not present

## 2014-12-06 ENCOUNTER — Other Ambulatory Visit: Payer: Self-pay | Admitting: Cardiovascular Disease

## 2014-12-21 DIAGNOSIS — N179 Acute kidney failure, unspecified: Secondary | ICD-10-CM | POA: Diagnosis not present

## 2014-12-21 DIAGNOSIS — E875 Hyperkalemia: Secondary | ICD-10-CM | POA: Diagnosis not present

## 2014-12-21 DIAGNOSIS — I129 Hypertensive chronic kidney disease with stage 1 through stage 4 chronic kidney disease, or unspecified chronic kidney disease: Secondary | ICD-10-CM | POA: Diagnosis not present

## 2014-12-21 DIAGNOSIS — E1122 Type 2 diabetes mellitus with diabetic chronic kidney disease: Secondary | ICD-10-CM | POA: Diagnosis not present

## 2014-12-21 DIAGNOSIS — N183 Chronic kidney disease, stage 3 (moderate): Secondary | ICD-10-CM | POA: Diagnosis not present

## 2014-12-26 DIAGNOSIS — Z23 Encounter for immunization: Secondary | ICD-10-CM | POA: Diagnosis not present

## 2014-12-26 DIAGNOSIS — Z961 Presence of intraocular lens: Secondary | ICD-10-CM | POA: Diagnosis not present

## 2015-01-10 ENCOUNTER — Ambulatory Visit (HOSPITAL_COMMUNITY)
Admission: RE | Admit: 2015-01-10 | Discharge: 2015-01-10 | Disposition: A | Discharge status: Home / Self Care | Payer: Medicare Other | Source: Ambulatory Visit | Attending: Physician Assistant | Admitting: Physician Assistant

## 2015-01-10 ENCOUNTER — Other Ambulatory Visit (HOSPITAL_COMMUNITY): Payer: Self-pay | Admitting: Physician Assistant

## 2015-01-10 DIAGNOSIS — L03319 Cellulitis of trunk, unspecified: Secondary | ICD-10-CM | POA: Insufficient documentation

## 2015-01-10 DIAGNOSIS — E6609 Other obesity due to excess calories: Secondary | ICD-10-CM | POA: Diagnosis not present

## 2015-01-10 DIAGNOSIS — R1012 Left upper quadrant pain: Secondary | ICD-10-CM | POA: Diagnosis not present

## 2015-01-10 DIAGNOSIS — R1032 Left lower quadrant pain: Secondary | ICD-10-CM

## 2015-01-10 DIAGNOSIS — K5732 Diverticulitis of large intestine without perforation or abscess without bleeding: Secondary | ICD-10-CM | POA: Diagnosis not present

## 2015-01-10 DIAGNOSIS — L02211 Cutaneous abscess of abdominal wall: Secondary | ICD-10-CM | POA: Diagnosis not present

## 2015-01-10 DIAGNOSIS — K432 Incisional hernia without obstruction or gangrene: Secondary | ICD-10-CM | POA: Diagnosis not present

## 2015-01-10 DIAGNOSIS — Z1389 Encounter for screening for other disorder: Secondary | ICD-10-CM | POA: Diagnosis not present

## 2015-01-10 DIAGNOSIS — Z6832 Body mass index (BMI) 32.0-32.9, adult: Secondary | ICD-10-CM | POA: Diagnosis not present

## 2015-01-10 MED ORDER — IOHEXOL 300 MG/ML  SOLN
100.0000 mL | Freq: Once | INTRAMUSCULAR | Status: AC | PRN
  Administered 2015-01-10: 100 mL via INTRAVENOUS

## 2015-02-15 DIAGNOSIS — E1140 Type 2 diabetes mellitus with diabetic neuropathy, unspecified: Secondary | ICD-10-CM | POA: Diagnosis not present

## 2015-02-15 DIAGNOSIS — Z6834 Body mass index (BMI) 34.0-34.9, adult: Secondary | ICD-10-CM | POA: Diagnosis not present

## 2015-02-15 DIAGNOSIS — Z1389 Encounter for screening for other disorder: Secondary | ICD-10-CM | POA: Diagnosis not present

## 2015-02-15 DIAGNOSIS — R201 Hypoesthesia of skin: Secondary | ICD-10-CM | POA: Diagnosis not present

## 2015-02-15 DIAGNOSIS — I10 Essential (primary) hypertension: Secondary | ICD-10-CM | POA: Diagnosis not present

## 2015-02-15 DIAGNOSIS — E538 Deficiency of other specified B group vitamins: Secondary | ICD-10-CM | POA: Diagnosis not present

## 2015-02-15 DIAGNOSIS — Z23 Encounter for immunization: Secondary | ICD-10-CM | POA: Diagnosis not present

## 2015-02-16 NOTE — Addendum Note (Signed)
Encounter addended by: Cathie Olden, RN on: 02/16/2015  2:45 PM<BR>     Documentation filed: Notes Section

## 2015-02-16 NOTE — Progress Notes (Signed)
ENTRY DATE 02/16/15.  Patient has gained approx 5 lbs in 4 days.  Patient has been tired and not feeling well over the past 2 weeks in Cardiac Rehab maintenance program.  Dr. Gerarda Fraction was called today to advise of the patients weight gain in past few days.  Was given a VO by Dr. Gerarda Fraction to increase lasix to 20mg  BID and for patient to call office for a F/U appointment next week. William [name] was called and advised of this and patient agreed.

## 2015-02-20 DIAGNOSIS — Z1389 Encounter for screening for other disorder: Secondary | ICD-10-CM | POA: Diagnosis not present

## 2015-02-20 DIAGNOSIS — K432 Incisional hernia without obstruction or gangrene: Secondary | ICD-10-CM | POA: Diagnosis not present

## 2015-02-20 DIAGNOSIS — R1012 Left upper quadrant pain: Secondary | ICD-10-CM | POA: Diagnosis not present

## 2015-02-20 DIAGNOSIS — Z6833 Body mass index (BMI) 33.0-33.9, adult: Secondary | ICD-10-CM | POA: Diagnosis not present

## 2015-02-20 DIAGNOSIS — R1032 Left lower quadrant pain: Secondary | ICD-10-CM | POA: Diagnosis not present

## 2015-02-26 ENCOUNTER — Emergency Department (HOSPITAL_COMMUNITY): Payer: Medicare Other

## 2015-02-26 ENCOUNTER — Encounter (HOSPITAL_COMMUNITY): Payer: Self-pay | Admitting: *Deleted

## 2015-02-26 ENCOUNTER — Inpatient Hospital Stay (HOSPITAL_COMMUNITY)
Admission: EM | Admit: 2015-02-26 | Discharge: 2015-02-28 | DRG: 603 | Disposition: A | Discharge status: Home / Self Care | Payer: Medicare Other | Attending: Internal Medicine | Admitting: Internal Medicine

## 2015-02-26 DIAGNOSIS — Z87891 Personal history of nicotine dependence: Secondary | ICD-10-CM | POA: Diagnosis not present

## 2015-02-26 DIAGNOSIS — K432 Incisional hernia without obstruction or gangrene: Secondary | ICD-10-CM | POA: Diagnosis present

## 2015-02-26 DIAGNOSIS — Z7982 Long term (current) use of aspirin: Secondary | ICD-10-CM

## 2015-02-26 DIAGNOSIS — Z7902 Long term (current) use of antithrombotics/antiplatelets: Secondary | ICD-10-CM

## 2015-02-26 DIAGNOSIS — Z794 Long term (current) use of insulin: Secondary | ICD-10-CM

## 2015-02-26 DIAGNOSIS — K219 Gastro-esophageal reflux disease without esophagitis: Secondary | ICD-10-CM | POA: Diagnosis present

## 2015-02-26 DIAGNOSIS — Z955 Presence of coronary angioplasty implant and graft: Secondary | ICD-10-CM

## 2015-02-26 DIAGNOSIS — L02211 Cutaneous abscess of abdominal wall: Secondary | ICD-10-CM | POA: Diagnosis not present

## 2015-02-26 DIAGNOSIS — R109 Unspecified abdominal pain: Secondary | ICD-10-CM | POA: Diagnosis not present

## 2015-02-26 DIAGNOSIS — L03311 Cellulitis of abdominal wall: Secondary | ICD-10-CM | POA: Diagnosis present

## 2015-02-26 DIAGNOSIS — Z79899 Other long term (current) drug therapy: Secondary | ICD-10-CM | POA: Diagnosis not present

## 2015-02-26 DIAGNOSIS — E785 Hyperlipidemia, unspecified: Secondary | ICD-10-CM | POA: Diagnosis present

## 2015-02-26 DIAGNOSIS — Z87442 Personal history of urinary calculi: Secondary | ICD-10-CM

## 2015-02-26 DIAGNOSIS — E7800 Pure hypercholesterolemia, unspecified: Secondary | ICD-10-CM | POA: Diagnosis present

## 2015-02-26 DIAGNOSIS — E119 Type 2 diabetes mellitus without complications: Secondary | ICD-10-CM

## 2015-02-26 DIAGNOSIS — I10 Essential (primary) hypertension: Secondary | ICD-10-CM | POA: Diagnosis present

## 2015-02-26 DIAGNOSIS — N200 Calculus of kidney: Secondary | ICD-10-CM | POA: Diagnosis not present

## 2015-02-26 DIAGNOSIS — R079 Chest pain, unspecified: Secondary | ICD-10-CM | POA: Diagnosis present

## 2015-02-26 DIAGNOSIS — R0789 Other chest pain: Secondary | ICD-10-CM | POA: Diagnosis not present

## 2015-02-26 DIAGNOSIS — K5732 Diverticulitis of large intestine without perforation or abscess without bleeding: Secondary | ICD-10-CM | POA: Diagnosis present

## 2015-02-26 DIAGNOSIS — I2510 Atherosclerotic heart disease of native coronary artery without angina pectoris: Secondary | ICD-10-CM | POA: Diagnosis not present

## 2015-02-26 DIAGNOSIS — Z8249 Family history of ischemic heart disease and other diseases of the circulatory system: Secondary | ICD-10-CM | POA: Diagnosis not present

## 2015-02-26 DIAGNOSIS — T8149XA Infection following a procedure, other surgical site, initial encounter: Secondary | ICD-10-CM | POA: Diagnosis present

## 2015-02-26 LAB — CBC WITH DIFFERENTIAL/PLATELET
BASOS ABS: 0.1 10*3/uL (ref 0.0–0.1)
BASOS PCT: 0 %
EOS ABS: 0.8 10*3/uL — AB (ref 0.0–0.7)
EOS PCT: 6 %
HCT: 38.3 % — ABNORMAL LOW (ref 39.0–52.0)
Hemoglobin: 13 g/dL (ref 13.0–17.0)
Lymphocytes Relative: 22 %
Lymphs Abs: 2.8 10*3/uL (ref 0.7–4.0)
MCH: 31.1 pg (ref 26.0–34.0)
MCHC: 33.9 g/dL (ref 30.0–36.0)
MCV: 91.6 fL (ref 78.0–100.0)
MONO ABS: 1 10*3/uL (ref 0.1–1.0)
Monocytes Relative: 8 %
NEUTROS ABS: 8.2 10*3/uL — AB (ref 1.7–7.7)
Neutrophils Relative %: 64 %
PLATELETS: 328 10*3/uL (ref 150–400)
RBC: 4.18 MIL/uL — ABNORMAL LOW (ref 4.22–5.81)
RDW: 12.8 % (ref 11.5–15.5)
WBC: 12.9 10*3/uL — ABNORMAL HIGH (ref 4.0–10.5)

## 2015-02-26 LAB — HEPATIC FUNCTION PANEL
ALBUMIN: 3.7 g/dL (ref 3.5–5.0)
ALT: 16 U/L — ABNORMAL LOW (ref 17–63)
AST: 16 U/L (ref 15–41)
Alkaline Phosphatase: 39 U/L (ref 38–126)
Bilirubin, Direct: <0.1 mg/dL — ABNORMAL LOW (ref 0.1–0.5)
TOTAL PROTEIN: 7.5 g/dL (ref 6.5–8.1)
Total Bilirubin: 0.4 mg/dL (ref 0.3–1.2)

## 2015-02-26 LAB — BASIC METABOLIC PANEL
ANION GAP: 12 (ref 5–15)
BUN: 17 mg/dL (ref 6–20)
CALCIUM: 9.2 mg/dL (ref 8.9–10.3)
CO2: 26 mmol/L (ref 22–32)
CREATININE: 1.56 mg/dL — AB (ref 0.61–1.24)
Chloride: 102 mmol/L (ref 101–111)
GFR, EST AFRICAN AMERICAN: 51 mL/min — AB (ref >60)
GFR, EST NON AFRICAN AMERICAN: 44 mL/min — AB (ref >60)
Glucose, Bld: 115 mg/dL — ABNORMAL HIGH (ref 65–99)
Potassium: 3.7 mmol/L (ref 3.5–5.1)
SODIUM: 140 mmol/L (ref 135–145)

## 2015-02-26 LAB — TROPONIN I
Troponin I: <0.03 ng/mL (ref <0.031)
Troponin I: <0.03 ng/mL (ref <0.031)
Troponin I: <0.03 ng/mL (ref <0.031)

## 2015-02-26 LAB — LIPASE, BLOOD: LIPASE: 22 U/L (ref 11–51)

## 2015-02-26 LAB — GLUCOSE, CAPILLARY: Glucose-Capillary: 131 mg/dL — ABNORMAL HIGH (ref 65–99)

## 2015-02-26 MED ORDER — ALPRAZOLAM 1 MG PO TABS: 1.0000 mg | ORAL_TABLET | Freq: Three times a day (TID) | ORAL | Status: DC | PRN

## 2015-02-26 MED ORDER — INSULIN ASPART 100 UNIT/ML ~~LOC~~ SOLN
0.0000 [IU] | Freq: Three times a day (TID) | SUBCUTANEOUS | Status: DC
  Administered 2015-02-27 (×2): 2 [IU] via SUBCUTANEOUS
  Administered 2015-02-27 – 2015-02-28 (×2): 3 [IU] via SUBCUTANEOUS

## 2015-02-26 MED ORDER — VITAMIN B-12 1000 MCG PO TABS
1000.0000 ug | ORAL_TABLET | Freq: Every day | ORAL | Status: DC
Start: 2015-02-26 — End: 2015-02-28
  Filled 2015-02-26: qty 1

## 2015-02-26 MED ORDER — MORPHINE SULFATE (PF) 2 MG/ML IV SOLN: 2.0000 mg | INTRAVENOUS | Status: DC | PRN

## 2015-02-26 MED ORDER — PIPERACILLIN-TAZOBACTAM 3.375 G IVPB 30 MIN: 3.3750 g | Freq: Three times a day (TID) | INTRAVENOUS | Status: DC

## 2015-02-26 MED ORDER — PIPERACILLIN-TAZOBACTAM 3.375 G IVPB
3.3750 g | Freq: Once | INTRAVENOUS | Status: AC
  Administered 2015-02-26: 3.375 g via INTRAVENOUS
  Filled 2015-02-26: qty 50

## 2015-02-26 MED ORDER — ASPIRIN 81 MG PO CHEW
CHEWABLE_TABLET | ORAL | Status: AC
Start: 2015-02-26 — End: 2015-02-26
  Administered 2015-02-26: 243 mg via ORAL
  Filled 2015-02-26: qty 3

## 2015-02-26 MED ORDER — ASPIRIN 81 MG PO CHEW
243.0000 mg | CHEWABLE_TABLET | Freq: Once | ORAL | Status: AC
  Administered 2015-02-26: 243 mg via ORAL

## 2015-02-26 MED ORDER — SODIUM CHLORIDE 0.9 % IV SOLN
INTRAVENOUS | Status: AC
  Administered 2015-02-26: 13:00:00 via INTRAVENOUS

## 2015-02-26 MED ORDER — SODIUM CHLORIDE 0.9% FLUSH
3.0000 mL | Freq: Two times a day (BID) | INTRAVENOUS | Status: DC
  Administered 2015-02-26 – 2015-02-27 (×2): 3 mL via INTRAVENOUS

## 2015-02-26 MED ORDER — ONDANSETRON HCL 4 MG/2ML IJ SOLN: 4.0000 mg | Freq: Four times a day (QID) | INTRAMUSCULAR | Status: DC | PRN

## 2015-02-26 MED ORDER — PRAVASTATIN SODIUM 40 MG PO TABS
40.0000 mg | ORAL_TABLET | Freq: Every day | ORAL | Status: DC
  Administered 2015-02-26 – 2015-02-27 (×2): 40 mg via ORAL
  Filled 2015-02-26 (×2): qty 1

## 2015-02-26 MED ORDER — ASPIRIN 325 MG PO TABS: 325.0000 mg | ORAL_TABLET | Freq: Once | ORAL | Status: DC

## 2015-02-26 MED ORDER — LISINOPRIL 10 MG PO TABS
40.0000 mg | ORAL_TABLET | Freq: Every day | ORAL | Status: DC
  Administered 2015-02-26 – 2015-02-28 (×3): 40 mg via ORAL
  Filled 2015-02-26 (×3): qty 4

## 2015-02-26 MED ORDER — PIPERACILLIN-TAZOBACTAM 3.375 G IVPB
3.3750 g | Freq: Three times a day (TID) | INTRAVENOUS | Status: DC
  Administered 2015-02-26 – 2015-02-28 (×5): 3.375 g via INTRAVENOUS
  Filled 2015-02-26 (×5): qty 50

## 2015-02-26 MED ORDER — ONDANSETRON HCL 4 MG PO TABS: 4.0000 mg | ORAL_TABLET | Freq: Four times a day (QID) | ORAL | Status: DC | PRN

## 2015-02-26 MED ORDER — DEXTROSE-NACL 5-0.9 % IV SOLN
INTRAVENOUS | Status: DC
  Administered 2015-02-26 – 2015-02-28 (×2): via INTRAVENOUS

## 2015-02-26 MED ORDER — TAMSULOSIN HCL 0.4 MG PO CAPS
0.4000 mg | ORAL_CAPSULE | Freq: Two times a day (BID) | ORAL | Status: DC
  Administered 2015-02-26 – 2015-02-28 (×4): 0.4 mg via ORAL
  Filled 2015-02-26 (×4): qty 1

## 2015-02-26 MED ORDER — ENOXAPARIN SODIUM 40 MG/0.4ML ~~LOC~~ SOLN
40.0000 mg | SUBCUTANEOUS | Status: DC
  Administered 2015-02-26 – 2015-02-27 (×2): 40 mg via SUBCUTANEOUS
  Filled 2015-02-26 (×2): qty 0.4

## 2015-02-26 MED ORDER — INSULIN ASPART 100 UNIT/ML ~~LOC~~ SOLN: 0.0000 [IU] | Freq: Every day | SUBCUTANEOUS | Status: DC

## 2015-02-26 MED ORDER — METOPROLOL TARTRATE 25 MG PO TABS
25.0000 mg | ORAL_TABLET | Freq: Every day | ORAL | Status: DC
  Filled 2015-02-26: qty 1

## 2015-02-26 MED ORDER — CO Q 10 10 MG PO CAPS: ORAL_CAPSULE | Freq: Every day | ORAL | Status: DC

## 2015-02-26 MED ORDER — DIATRIZOATE MEGLUMINE & SODIUM 66-10 % PO SOLN
ORAL | Status: AC
  Filled 2015-02-26: qty 30

## 2015-02-26 MED ORDER — ASPIRIN EC 81 MG PO TBEC
81.0000 mg | DELAYED_RELEASE_TABLET | Freq: Every morning | ORAL | Status: DC
  Administered 2015-02-27 – 2015-02-28 (×2): 81 mg via ORAL
  Filled 2015-02-26 (×2): qty 1

## 2015-02-26 NOTE — Progress Notes (Signed)
Patient asking about his Glucophage.  Dr. Marin Comment notified and stated that Glucophage is being held along with the Lantus insulin.  Dr. Marin Comment gave order for moderate scale sliding scale with bedtime coverage.

## 2015-02-26 NOTE — ED Notes (Signed)
Red area to LUQ of abdomen.
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
## 2015-02-26 NOTE — ED Notes (Signed)
Pt c/o chest pain that started x 1 hour ago; pt states he took 2 nitro with some relief

## 2015-02-26 NOTE — ED Notes (Signed)
Report recieved 

## 2015-02-26 NOTE — ED Notes (Signed)
Pt reports no pain, but states that he is hungry- physician in to tell pt that Surg consult is ordered and clear liquids only till eval by surgery
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
## 2015-02-26 NOTE — ED Notes (Signed)
MD at bedside. 

## 2015-02-27 DIAGNOSIS — L02211 Cutaneous abscess of abdominal wall: Principal | ICD-10-CM

## 2015-02-27 LAB — GLUCOSE, CAPILLARY
GLUCOSE-CAPILLARY: 154 mg/dL — AB (ref 65–99)
GLUCOSE-CAPILLARY: 148 mg/dL — AB (ref 65–99)
Glucose-Capillary: 139 mg/dL — ABNORMAL HIGH (ref 65–99)
Glucose-Capillary: 133 mg/dL — ABNORMAL HIGH (ref 65–99)

## 2015-02-27 LAB — CBC
HCT: 33.9 % — ABNORMAL LOW (ref 39.0–52.0)
Hemoglobin: 11.5 g/dL — ABNORMAL LOW (ref 13.0–17.0)
MCH: 30.7 pg (ref 26.0–34.0)
MCHC: 33.9 g/dL (ref 30.0–36.0)
MCV: 90.6 fL (ref 78.0–100.0)
PLATELETS: 318 10*3/uL (ref 150–400)
RBC: 3.74 MIL/uL — ABNORMAL LOW (ref 4.22–5.81)
RDW: 12.8 % (ref 11.5–15.5)
WBC: 11.9 10*3/uL — AB (ref 4.0–10.5)

## 2015-02-27 LAB — TROPONIN I
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
## 2015-02-28 DIAGNOSIS — E0811 Diabetes mellitus due to underlying condition with ketoacidosis with coma: Secondary | ICD-10-CM

## 2015-02-28 DIAGNOSIS — Z794 Long term (current) use of insulin: Secondary | ICD-10-CM

## 2015-02-28 LAB — GLUCOSE, CAPILLARY: Glucose-Capillary: 152 mg/dL — ABNORMAL HIGH (ref 65–99)

## 2015-02-28 MED ORDER — AMOXICILLIN-POT CLAVULANATE 875-125 MG PO TABS: 1.0000 | ORAL_TABLET | Freq: Two times a day (BID) | ORAL | Status: DC

## 2015-02-28 MED ORDER — AMOXICILLIN-POT CLAVULANATE 875-125 MG PO TABS
1.0000 | ORAL_TABLET | Freq: Two times a day (BID) | ORAL | Status: DC
  Administered 2015-02-28: 1 via ORAL
  Filled 2015-02-28: qty 1

## 2015-02-28 MED ORDER — HYDROCODONE-ACETAMINOPHEN 10-325 MG PO TABS
1.0000 | ORAL_TABLET | ORAL | Status: DC | PRN
  Administered 2015-02-28: 1 via ORAL
  Filled 2015-02-28: qty 1

## 2015-02-28 MED ORDER — CLOPIDOGREL BISULFATE 75 MG PO TABS
75.0000 mg | ORAL_TABLET | Freq: Every day | ORAL | Status: DC
  Administered 2015-02-28: 75 mg via ORAL
  Filled 2015-02-28: qty 1

## 2015-02-28 NOTE — Progress Notes (Signed)
Patient d/c home with instructions and prescriptions. IV cath removed and intact. No c/o pain at the site. No c/o pain at this time.

## 2015-02-28 NOTE — Care Management Important Message (Signed)
Important Message  Patient Details  Name: BERL BAUMAN MRN: [id number] Date of Birth: Jun 05, 1945   Medicare Important Message Given:  Yes    Alvie Heidelberg, RN 02/28/2015, 10:58 AM

## 2015-02-28 NOTE — Progress Notes (Signed)
  Subjective: Patient feels better. Less tenderness noted. Is passing flatus.  Objective: Vital signs in last 24 hours: Temp:  [97.9 F (36.6 C)-98.6 F (37 C)] 98.6 F (37 C) (02/22 0517) Pulse Rate:  [66-75] 75 (02/22 0826) Resp:  [18-20] 18 (02/22 0517) BP: (103-125)/(52-60) 103/55 mmHg (02/22 0826) SpO2:  [96 %-97 %] 96 % (02/22 0517) Last BM Date: 02/28/15  Intake/Output from previous day: 02/21 0701 - 02/22 0700 In: 1440 [P.O.:1440] Out: 1200 [Urine:1200] Intake/Output this shift: Total I/O In: 480 [P.O.:480] Out: -   General appearance: alert, cooperative and no distress GI: Less induration and erythema noted at abdominal hernia site. No incarcerated bowel present. No drainage present.  Lab Results:   Recent Labs  02/26/15 0709 02/27/15 2245  WBC 12.9* 11.9*  HGB 13.0 11.5*  HCT 38.3* 33.9*  PLT 328 318   BMET  Recent Labs  02/26/15 0709  NA 140  K 3.7  CL 102  CO2 26  GLUCOSE 115*  BUN 17  CREATININE 1.56*  CALCIUM 9.2   PT/INR No results for input(s): LABPROT, INR in the last 72 hours.  Studies/Results: No results found.  Anti-infectives: Anti-infectives    Start     Dose/Rate Route Frequency Ordered Stop   02/28/15 1030  amoxicillin-clavulanate (AUGMENTIN) 875-125 MG per tablet 1 tablet     1 tablet Oral Every 12 hours 02/28/15 1018 03/14/15 0959   02/28/15 0000  amoxicillin-clavulanate (AUGMENTIN) 875-125 MG tablet     1 tablet Oral Every 12 hours 02/28/15 1018     02/26/15 2000  piperacillin-tazobactam (ZOSYN) IVPB 3.375 g     3.375 g 12.5 mL/hr over 240 Minutes Intravenous Every 8 hours 02/26/15 1602     02/26/15 1600  piperacillin-tazobactam (ZOSYN) IVPB 3.375 g  Status:  Discontinued     3.375 g 100 mL/hr over 30 Minutes Intravenous 3 times per day 02/26/15 1558 02/26/15 1601   02/26/15 1200  piperacillin-tazobactam (ZOSYN) IVPB 3.375 g     3.375 g 12.5 mL/hr over 240 Minutes Intravenous  Once 02/26/15 1148 02/26/15 1213       Assessment/Plan: Impression: Incisional hernia with evidence of diverticulitis in transverse colon that is present in the hernia. This seems to be improving. Patient would like to go home which is fine with me. Would treat with 2 weeks of Augmentin. I will see patient in follow-up.  LOS: 2 days    Percilla Tweten A 02/28/2015
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
## 2015-02-28 NOTE — Discharge Summary (Signed)
Physician Discharge Summary  [name] DOB: Sep 14, 1945 DOA: 02/26/2015  PCP: [name], MD  Admit date: 02/26/2015 Discharge date: 02/28/2015  Time spent: 35 minutes  Recommendations for Outpatient Follow-up:  1. Follow up with Dr Arnoldo Morale in one week. 2. Follow up with PCP in one week.    Discharge Diagnoses:  Principal Problem:   Chest pain Active Problems:   Hyperlipidemia   DM (diabetes mellitus) (HCC)   HTN (hypertension)   Coronary artery disease   Abdominal wall abscess at site of surgical wound   Abscess of abdominal wall   Discharge Condition: improved.  No chest pain, able to eat.   Diet recommendation: Carb modified, cardiac diet.   Filed Weights   02/26/15 0703 02/26/15 1551  Weight: 108.863 kg (240 lb) 106.051 kg (233 lb 12.8 oz)    History of present illness:  Patient was admitted by me on Feb 26, 2015 for abdominal wall abscess, and atypical chest pain.  As per my prior H and P:  " William [name] is an 70 y.o. male with hx of known CAD, s/p RCA stent a few years ago, still on DUAT, hx of DM, HTN, HLD, GERD, presented to the ER with abdominal wall pain, along with retrosternal Chest pain at rest. He has no SOB, N/V, or diaphoresis. He was given NTG, and it improved. He also had erythema of the abdominal wall and pain, but no fever or chills. Evaluation in the ER showed abdominal CT showing bowel in the abdominal hernia, but no evidence of incarceration, but suggestive if inflammation and possible abscess. Surgery was consulted, and Dr Arnoldo Morale recommended IV antibiotics. His troponin was negative, and his EKG showed no evidence of ischemia nor injury. His serology showed WBC of 12.9K and Hb of 13. William [name] was asked to admit him for abdominal wall abscess and atypical CP r/out.    Hospital Course:  ORPHEUS SCHMICK is an 70 y.o. male with hx of CAD, s/p RCA stent a few years ago, on DUAT, hx of DM, HTN, HLD GERD, admitted for abdominal  wall infection and atypical CP. He was ruled out. No further CP. He was started on IV Zosyn, clear liquid, and was seen by surgery. Dr Arnoldo Morale recommended IV antibiotics, no surgery though he may require follow up surgery once infection is taken care of. He is feeling better. Able to have BM and no nausea or vomiting.  The area of infection has improved slightly.  I was going to continue antibiotics IV, but Dr Arnoldo Morale felt strongly that he can be discharged home today.  He will follow up with patient in the office.  Dr Arnoldo Morale said he spoke with patient about what to look for including fever, chills, nausea, vomiting, to call his office.  He will see his PCP in one week for follow up as well. He will be discharged with 2 weeks of Augmentin.  Thank you and Good Day.   Consultations:  Surgery:  [facility].   Discharge Exam: Filed Vitals:   02/28/15 0517 02/28/15 0826  BP: 108/53 103/55  Pulse: 73 75  Temp: 98.6 F (37 C)   Resp: 18     Discharge Instructions    Diet - low sodium heart healthy    Complete by:  As directed      Discharge instructions    Complete by:  As directed   Follow up with Surgery in 1 week.  If you have abdominal pain, chest pain, fever,  chills, nausea or vomiting, call your doctor right away.     Increase activity slowly    Complete by:  As directed           Current Discharge Medication List    START taking these medications   Details  amoxicillin-clavulanate (AUGMENTIN) 875-125 MG tablet Take 1 tablet by mouth every 12 (twelve) hours. Qty: 28 tablet, Refills: 0      CONTINUE these medications which have NOT CHANGED   Details  ALPRAZolam (XANAX) 1 MG tablet Take 1 mg by mouth 4 (four) times daily as needed. For anxiety    aspirin EC 81 MG tablet Take 81 mg by mouth every morning.    clopidogrel (PLAVIX) 75 MG tablet TAKE 1 TABLET BY MOUTH EVERY DAY WITH BREAKFAST Qty: 30 tablet, Refills: 6    Coenzyme Q10 (CO Q 10 PO) Take 1 capsule by mouth  daily.    Cyanocobalamin (B-12 COMPLIANCE INJECTION IJ) Inject 1 Dose as directed.    furosemide (LASIX) 20 MG tablet Take 20 mg by mouth 2 (two) times daily.  Refills: 0    glipiZIDE (GLUCOTROL) 10 MG tablet Take 10 mg by mouth 2 (two) times daily.    LANTUS SOLOSTAR 100 UNIT/ML Solostar Pen Inject 75 Units into the skin at bedtime. Patient takes 50 to 60 units based on blood sugar levels over 190    lisinopril (PRINIVIL,ZESTRIL) 40 MG tablet Take 40 mg by mouth daily.     metFORMIN (GLUCOPHAGE) 1000 MG tablet Take 1,000 mg by mouth 2 (two) times daily. Refills: 3    nitroGLYCERIN (NITROSTAT) 0.4 MG SL tablet Place 1 tablet (0.4 mg total) under the tongue every 5 (five) minutes as needed for chest pain. Qty: 25 tablet, Refills: 12    pravastatin (PRAVACHOL) 40 MG tablet Take 40 mg by mouth at bedtime. Refills: 3    tamsulosin (FLOMAX) 0.4 MG CAPS capsule Take 1 capsule by mouth 2 (two) times daily.     metoprolol tartrate (LOPRESSOR) 25 MG tablet Take 1 tablet (25 mg total) by mouth daily. Qty: 30 tablet, Refills: 11    vitamin B-12 (CYANOCOBALAMIN) 1000 MCG tablet Take 1,000 mcg by mouth daily. Reported on 02/26/2015      STOP taking these medications     ciprofloxacin (CIPRO) 500 MG tablet      HYDROcodone-acetaminophen (NORCO) 10-325 MG per tablet      metroNIDAZOLE (FLAGYL) 250 MG tablet      hydrochlorothiazide (HYDRODIURIL) 25 MG tablet      naproxen (NAPROSYN) 500 MG tablet      ondansetron (ZOFRAN) 4 MG tablet      oxyCODONE-acetaminophen (PERCOCET/ROXICET) 5-325 MG tablet        No Known Allergies    The results of significant diagnostics from this hospitalization (including imaging, microbiology, ancillary and laboratory) are listed below for reference.    Significant Diagnostic Studies: Ct Abdomen Pelvis Wo Contrast  02/26/2015  CLINICAL DATA:  70 year old male with incisional hernia presenting redness the left of the hernia in the left upper  abdomen. EXAM: CT ABDOMEN AND PELVIS WITHOUT CONTRAST TECHNIQUE: Multidetector CT imaging of the abdomen and pelvis was performed following the standard protocol without IV contrast. COMPARISON:  CT dated 01/10/2015 FINDINGS: Evaluation of this exam is limited in the absence of intravenous contrast. The visualized lung bases are clear. There is coronary vascular calcification. No intra-abdominal free air or free fluid. The liver, gallbladder, pancreas, spleen, adrenal glands appear unremarkable. There is a 3 mm nonobstructing  left renal inferior pole calculus. A 3 mm nonobstructing right renal upper pole calculus is also noted. There is no hydronephrosis on either side. There is focal area of radiograph irregularity and scarring at the inferior pole of the left kidney. The visualized ureters appear unremarkable. The urinary bladder is predominantly collapsed. There is apparent diffuse thickening of the bladder wall which may be partly related to underdistention. Cystitis is not excluded. Correlation with urinalysis recommended. The prostate gland and seminal vesicles are grossly unremarkable. Midline vertical anterior abdominal wall incisional scar noted. Left upper abdomen hernia to the left of midline again noted which contains small amount of omental fat and a short segment of the transverse colon. There is a 3.2 x 1.9 cm structure containing air-fluid level inferior to the herniated segment of transverse colon within the hernia. Small amount of oral contrast is also noted within this structure. This likely represents an inflamed transverse colon diverticulum within the hernia. An abscess is less likely. There is inflammatory changes of the surrounding subcutaneous fat. There is no evidence of bowel obstruction. There is extensive colonic diverticulosis. The appendix is not visualized with certainty. No inflammatory changes identified in the right lower quadrant. Moderate aortoiliac atherosclerotic disease. No  portal venous gas identified. There is no adenopathy. Degenerative changes of the spine. The osseous structures are intact. IMPRESSION: Anterior abdominal wall surgical incision and a hernia defect just to to the left of the incision line containing a short segment of transverse colon. An inflamed structure inferior to the herniated portion of the transverse colon within the hernia sac appears to contain a small amount of oral contrast and likely represents an infected/inflamed diverticulum. An abscess is less likely. Small nonobstructing bilateral renal calculi.  No hydronephrosis. Electronically Signed   By: Anner Crete M.D.   On: 02/26/2015 11:30   Dg Chest 2 View  02/26/2015  CLINICAL DATA:  Chest pain for the past hour, partially relief with nitroglycerin. EXAM: CHEST  2 VIEW COMPARISON:  10/26/2007 and chest CT dated 10/21/2014. FINDINGS: Poor inspiration. Borderline enlarged cardiac silhouette. Mildly prominent pulmonary vasculature and interstitial markings. No pleural fluid. Thoracic spine degenerative changes. IMPRESSION: 1. No acute abnormality. 2. Borderline cardiomegaly, mild pulmonary vascular congestion and mild chronic interstitial lung disease. Electronically Signed   By: Claudie Revering M.D.   On: 02/26/2015 07:39    Microbiology: Recent Results (from the past 240 hour(s))  Blood culture (routine x 2)     Status: None (Preliminary result)   Collection Time: 02/26/15 12:51 PM  Result Value Ref Range Status   Specimen Description BLOOD LEFT HAND  Final   Special Requests BOTTLES DRAWN AEROBIC AND ANAEROBIC 6CC EACH  Final   Culture NO GROWTH 1 DAY  Final   Report Status PENDING  Incomplete  Blood culture (routine x 2)     Status: None (Preliminary result)   Collection Time: 02/26/15 12:55 PM  Result Value Ref Range Status   Specimen Description BLOOD RIGHT ANTECUBITAL  Final   Special Requests BOTTLES DRAWN AEROBIC AND ANAEROBIC 6CC EACH  Final   Culture NO GROWTH 1 DAY  Final    Report Status PENDING  Incomplete     Labs: Basic Metabolic Panel:  Recent Labs Lab 02/26/15 0709  NA 140  K 3.7  CL 102  CO2 26  GLUCOSE 115*  BUN 17  CREATININE 1.56*  CALCIUM 9.2   Liver Function Tests:  Recent Labs Lab 02/26/15 0709  AST 16  ALT 16*  ALKPHOS  39  BILITOT 0.4  PROT 7.5  ALBUMIN 3.7    Recent Labs Lab 02/26/15 0709  LIPASE 22   No results for input(s): AMMONIA in the last 168 hours. CBC:  Recent Labs Lab 02/26/15 0709 02/27/15 2245  WBC 12.9* 11.9*  NEUTROABS 8.2*  --   HGB 13.0 11.5*  HCT 38.3* 33.9*  MCV 91.6 90.6  PLT 328 318   Cardiac Enzymes:  Recent Labs Lab 02/26/15 0709 02/26/15 1045 02/26/15 1621 02/26/15 2135 02/27/15 0401  TROPONINI <0.03 <0.03 <0.03 <0.03 <0.03   CBG:  Recent Labs Lab 02/27/15 0757 02/27/15 1130 02/27/15 1604 02/27/15 2117 02/28/15 0742  GLUCAP 154* 148* 139* 133* 152*    Signed:  Armin Yerger MD.  [facility] 02/28/2015, 10:19 AM

## 2015-03-04 LAB — CULTURE, BLOOD (ROUTINE X 2)
CULTURE: NO GROWTH
Culture: NO GROWTH

## 2015-03-05 DIAGNOSIS — Z6832 Body mass index (BMI) 32.0-32.9, adult: Secondary | ICD-10-CM | POA: Diagnosis not present

## 2015-03-05 DIAGNOSIS — M1991 Primary osteoarthritis, unspecified site: Secondary | ICD-10-CM | POA: Diagnosis not present

## 2015-03-05 DIAGNOSIS — I10 Essential (primary) hypertension: Secondary | ICD-10-CM | POA: Diagnosis not present

## 2015-03-05 DIAGNOSIS — R945 Abnormal results of liver function studies: Secondary | ICD-10-CM | POA: Diagnosis not present

## 2015-03-05 DIAGNOSIS — E1140 Type 2 diabetes mellitus with diabetic neuropathy, unspecified: Secondary | ICD-10-CM | POA: Diagnosis not present

## 2015-03-05 DIAGNOSIS — L0390 Cellulitis, unspecified: Secondary | ICD-10-CM | POA: Diagnosis not present

## 2015-03-05 DIAGNOSIS — K469 Unspecified abdominal hernia without obstruction or gangrene: Secondary | ICD-10-CM | POA: Diagnosis not present

## 2015-03-05 DIAGNOSIS — Z1389 Encounter for screening for other disorder: Secondary | ICD-10-CM | POA: Diagnosis not present

## 2015-03-05 DIAGNOSIS — R079 Chest pain, unspecified: Secondary | ICD-10-CM | POA: Diagnosis not present

## 2015-03-05 DIAGNOSIS — E6609 Other obesity due to excess calories: Secondary | ICD-10-CM | POA: Diagnosis not present

## 2015-03-05 DIAGNOSIS — E669 Obesity, unspecified: Secondary | ICD-10-CM | POA: Diagnosis not present

## 2015-03-13 DIAGNOSIS — L0390 Cellulitis, unspecified: Secondary | ICD-10-CM | POA: Diagnosis not present

## 2015-03-16 DIAGNOSIS — E538 Deficiency of other specified B group vitamins: Secondary | ICD-10-CM | POA: Diagnosis not present

## 2015-03-19 DIAGNOSIS — R972 Elevated prostate specific antigen [PSA]: Secondary | ICD-10-CM | POA: Diagnosis not present

## 2015-03-22 DIAGNOSIS — E559 Vitamin D deficiency, unspecified: Secondary | ICD-10-CM | POA: Diagnosis not present

## 2015-03-22 DIAGNOSIS — I129 Hypertensive chronic kidney disease with stage 1 through stage 4 chronic kidney disease, or unspecified chronic kidney disease: Secondary | ICD-10-CM | POA: Diagnosis not present

## 2015-03-22 DIAGNOSIS — E1122 Type 2 diabetes mellitus with diabetic chronic kidney disease: Secondary | ICD-10-CM | POA: Diagnosis not present

## 2015-03-22 DIAGNOSIS — N183 Chronic kidney disease, stage 3 (moderate): Secondary | ICD-10-CM | POA: Diagnosis not present

## 2015-03-22 DIAGNOSIS — I259 Chronic ischemic heart disease, unspecified: Secondary | ICD-10-CM | POA: Diagnosis not present

## 2015-03-26 DIAGNOSIS — R39198 Other difficulties with micturition: Secondary | ICD-10-CM | POA: Diagnosis not present

## 2015-03-26 DIAGNOSIS — R3915 Urgency of urination: Secondary | ICD-10-CM | POA: Diagnosis not present

## 2015-03-26 DIAGNOSIS — R972 Elevated prostate specific antigen [PSA]: Secondary | ICD-10-CM | POA: Diagnosis not present

## 2015-04-10 DIAGNOSIS — L0390 Cellulitis, unspecified: Secondary | ICD-10-CM | POA: Diagnosis not present

## 2015-04-16 DIAGNOSIS — E538 Deficiency of other specified B group vitamins: Secondary | ICD-10-CM | POA: Diagnosis not present

## 2015-04-16 DIAGNOSIS — Z719 Counseling, unspecified: Secondary | ICD-10-CM | POA: Diagnosis not present

## 2015-04-16 DIAGNOSIS — G4730 Sleep apnea, unspecified: Secondary | ICD-10-CM | POA: Diagnosis not present

## 2015-04-18 DIAGNOSIS — G4730 Sleep apnea, unspecified: Secondary | ICD-10-CM | POA: Diagnosis not present

## 2015-05-09 ENCOUNTER — Encounter: Payer: Self-pay | Admitting: Cardiovascular Disease

## 2015-05-09 ENCOUNTER — Ambulatory Visit (INDEPENDENT_AMBULATORY_CARE_PROVIDER_SITE_OTHER): Payer: Medicare Other | Admitting: Cardiovascular Disease

## 2015-05-09 VITALS — BP 110/60 | HR 83 | Ht 69.0 in | Wt 241.0 lb

## 2015-05-09 DIAGNOSIS — I2510 Atherosclerotic heart disease of native coronary artery without angina pectoris: Secondary | ICD-10-CM | POA: Diagnosis not present

## 2015-05-09 DIAGNOSIS — I10 Essential (primary) hypertension: Secondary | ICD-10-CM | POA: Diagnosis not present

## 2015-05-09 DIAGNOSIS — E785 Hyperlipidemia, unspecified: Secondary | ICD-10-CM | POA: Diagnosis not present

## 2015-05-09 DIAGNOSIS — I9589 Other hypotension: Secondary | ICD-10-CM

## 2015-05-09 DIAGNOSIS — R011 Cardiac murmur, unspecified: Secondary | ICD-10-CM | POA: Diagnosis not present

## 2015-05-09 MED ORDER — LISINOPRIL 10 MG PO TABS: 10.0000 mg | ORAL_TABLET | Freq: Every day | ORAL | Status: DC
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
## 2015-05-09 NOTE — Patient Instructions (Signed)
Your physician wants you to follow-up in:  1 year You will receive a reminder letter in the mail two months in advance. If you don't receive a letter, please call our office to schedule the follow-up appointment.    DECREASE Lisinopril to 10 mg daily   Your physician has requested that you have an echocardiogram. Echocardiography is a painless test that uses sound waves to create images of your heart. It provides your doctor with information about the size and shape of your heart and how well your heart's chambers and valves are working. This procedure takes approximately one hour. There are no restrictions for this procedure.      Thank you for choosing [facility] !

## 2015-05-14 ENCOUNTER — Ambulatory Visit (HOSPITAL_COMMUNITY)
Admission: RE | Admit: 2015-05-14 | Discharge: 2015-05-14 | Disposition: A | Discharge status: Home / Self Care | Payer: Medicare Other | Source: Ambulatory Visit | Attending: Cardiovascular Disease | Admitting: Cardiovascular Disease

## 2015-05-14 DIAGNOSIS — I059 Rheumatic mitral valve disease, unspecified: Secondary | ICD-10-CM | POA: Insufficient documentation

## 2015-05-14 DIAGNOSIS — E785 Hyperlipidemia, unspecified: Secondary | ICD-10-CM | POA: Insufficient documentation

## 2015-05-14 DIAGNOSIS — I119 Hypertensive heart disease without heart failure: Secondary | ICD-10-CM | POA: Diagnosis not present

## 2015-05-14 DIAGNOSIS — R011 Cardiac murmur, unspecified: Secondary | ICD-10-CM | POA: Insufficient documentation

## 2015-05-14 DIAGNOSIS — E119 Type 2 diabetes mellitus without complications: Secondary | ICD-10-CM | POA: Diagnosis not present

## 2015-05-14 DIAGNOSIS — I2510 Atherosclerotic heart disease of native coronary artery without angina pectoris: Secondary | ICD-10-CM | POA: Insufficient documentation

## 2015-05-14 DIAGNOSIS — I350 Nonrheumatic aortic (valve) stenosis: Secondary | ICD-10-CM | POA: Insufficient documentation

## 2015-05-17 DIAGNOSIS — R972 Elevated prostate specific antigen [PSA]: Secondary | ICD-10-CM | POA: Diagnosis not present

## 2015-05-17 DIAGNOSIS — C61 Malignant neoplasm of prostate: Secondary | ICD-10-CM | POA: Diagnosis not present

## 2015-05-21 DIAGNOSIS — Z1389 Encounter for screening for other disorder: Secondary | ICD-10-CM | POA: Diagnosis not present

## 2015-05-21 DIAGNOSIS — E782 Mixed hyperlipidemia: Secondary | ICD-10-CM | POA: Diagnosis not present

## 2015-05-21 DIAGNOSIS — M1991 Primary osteoarthritis, unspecified site: Secondary | ICD-10-CM | POA: Diagnosis not present

## 2015-05-21 DIAGNOSIS — Z23 Encounter for immunization: Secondary | ICD-10-CM | POA: Diagnosis not present

## 2015-05-21 DIAGNOSIS — R201 Hypoesthesia of skin: Secondary | ICD-10-CM | POA: Diagnosis not present

## 2015-05-21 DIAGNOSIS — I10 Essential (primary) hypertension: Secondary | ICD-10-CM | POA: Diagnosis not present

## 2015-05-21 DIAGNOSIS — E119 Type 2 diabetes mellitus without complications: Secondary | ICD-10-CM | POA: Diagnosis not present

## 2015-06-05 DIAGNOSIS — C61 Malignant neoplasm of prostate: Secondary | ICD-10-CM | POA: Diagnosis not present

## 2015-06-27 DIAGNOSIS — E785 Hyperlipidemia, unspecified: Secondary | ICD-10-CM | POA: Diagnosis not present

## 2015-06-27 DIAGNOSIS — Z794 Long term (current) use of insulin: Secondary | ICD-10-CM | POA: Diagnosis not present

## 2015-06-27 DIAGNOSIS — Z7902 Long term (current) use of antithrombotics/antiplatelets: Secondary | ICD-10-CM | POA: Diagnosis not present

## 2015-06-27 DIAGNOSIS — Z87891 Personal history of nicotine dependence: Secondary | ICD-10-CM | POA: Diagnosis not present

## 2015-06-27 DIAGNOSIS — I10 Essential (primary) hypertension: Secondary | ICD-10-CM | POA: Diagnosis not present

## 2015-06-27 DIAGNOSIS — Z8042 Family history of malignant neoplasm of prostate: Secondary | ICD-10-CM | POA: Diagnosis not present

## 2015-06-27 DIAGNOSIS — C61 Malignant neoplasm of prostate: Secondary | ICD-10-CM | POA: Diagnosis not present

## 2015-06-27 DIAGNOSIS — Z7982 Long term (current) use of aspirin: Secondary | ICD-10-CM | POA: Diagnosis not present

## 2015-06-27 DIAGNOSIS — E119 Type 2 diabetes mellitus without complications: Secondary | ICD-10-CM | POA: Diagnosis not present

## 2015-06-27 DIAGNOSIS — Z79899 Other long term (current) drug therapy: Secondary | ICD-10-CM | POA: Diagnosis not present

## 2015-07-17 ENCOUNTER — Other Ambulatory Visit: Payer: Self-pay | Admitting: Cardiovascular Disease

## 2015-07-19 DIAGNOSIS — C61 Malignant neoplasm of prostate: Secondary | ICD-10-CM | POA: Diagnosis not present

## 2015-07-25 DIAGNOSIS — C61 Malignant neoplasm of prostate: Secondary | ICD-10-CM | POA: Diagnosis not present

## 2015-07-27 DIAGNOSIS — C61 Malignant neoplasm of prostate: Secondary | ICD-10-CM | POA: Diagnosis not present

## 2015-07-31 DIAGNOSIS — C61 Malignant neoplasm of prostate: Secondary | ICD-10-CM | POA: Diagnosis not present

## 2015-08-01 DIAGNOSIS — C61 Malignant neoplasm of prostate: Secondary | ICD-10-CM | POA: Diagnosis not present

## 2015-08-03 DIAGNOSIS — C61 Malignant neoplasm of prostate: Secondary | ICD-10-CM | POA: Diagnosis not present

## 2015-08-06 DIAGNOSIS — C61 Malignant neoplasm of prostate: Secondary | ICD-10-CM | POA: Diagnosis not present

## 2015-08-07 DIAGNOSIS — Z510 Encounter for antineoplastic radiation therapy: Secondary | ICD-10-CM | POA: Diagnosis not present

## 2015-08-07 DIAGNOSIS — C61 Malignant neoplasm of prostate: Secondary | ICD-10-CM | POA: Diagnosis not present

## 2015-08-08 DIAGNOSIS — Z510 Encounter for antineoplastic radiation therapy: Secondary | ICD-10-CM | POA: Diagnosis not present

## 2015-08-08 DIAGNOSIS — C61 Malignant neoplasm of prostate: Secondary | ICD-10-CM | POA: Diagnosis not present

## 2015-08-09 DIAGNOSIS — C61 Malignant neoplasm of prostate: Secondary | ICD-10-CM | POA: Diagnosis not present

## 2015-08-09 DIAGNOSIS — Z510 Encounter for antineoplastic radiation therapy: Secondary | ICD-10-CM | POA: Diagnosis not present

## 2015-08-10 DIAGNOSIS — Z510 Encounter for antineoplastic radiation therapy: Secondary | ICD-10-CM | POA: Diagnosis not present

## 2015-08-10 DIAGNOSIS — C61 Malignant neoplasm of prostate: Secondary | ICD-10-CM | POA: Diagnosis not present

## 2015-08-12 DIAGNOSIS — Z510 Encounter for antineoplastic radiation therapy: Secondary | ICD-10-CM | POA: Diagnosis not present

## 2015-08-12 DIAGNOSIS — C61 Malignant neoplasm of prostate: Secondary | ICD-10-CM | POA: Diagnosis not present

## 2015-08-13 DIAGNOSIS — C61 Malignant neoplasm of prostate: Secondary | ICD-10-CM | POA: Diagnosis not present

## 2015-08-13 DIAGNOSIS — Z510 Encounter for antineoplastic radiation therapy: Secondary | ICD-10-CM | POA: Diagnosis not present

## 2015-08-14 DIAGNOSIS — Z510 Encounter for antineoplastic radiation therapy: Secondary | ICD-10-CM | POA: Diagnosis not present

## 2015-08-14 DIAGNOSIS — C61 Malignant neoplasm of prostate: Secondary | ICD-10-CM | POA: Diagnosis not present

## 2015-08-15 DIAGNOSIS — Z510 Encounter for antineoplastic radiation therapy: Secondary | ICD-10-CM | POA: Diagnosis not present

## 2015-08-15 DIAGNOSIS — C61 Malignant neoplasm of prostate: Secondary | ICD-10-CM | POA: Diagnosis not present

## 2015-08-16 DIAGNOSIS — Z510 Encounter for antineoplastic radiation therapy: Secondary | ICD-10-CM | POA: Diagnosis not present

## 2015-08-16 DIAGNOSIS — C61 Malignant neoplasm of prostate: Secondary | ICD-10-CM | POA: Diagnosis not present

## 2015-08-17 ENCOUNTER — Other Ambulatory Visit: Payer: Self-pay

## 2015-08-17 ENCOUNTER — Observation Stay (HOSPITAL_COMMUNITY)
Admission: EM | Admit: 2015-08-17 | Discharge: 2015-08-18 | Disposition: A | Discharge status: Home / Self Care | Payer: Medicare Other | Attending: Internal Medicine | Admitting: Internal Medicine

## 2015-08-17 ENCOUNTER — Encounter (HOSPITAL_COMMUNITY): Payer: Self-pay | Admitting: *Deleted

## 2015-08-17 ENCOUNTER — Emergency Department (HOSPITAL_COMMUNITY): Payer: Medicare Other

## 2015-08-17 DIAGNOSIS — E785 Hyperlipidemia, unspecified: Secondary | ICD-10-CM

## 2015-08-17 DIAGNOSIS — Z79899 Other long term (current) drug therapy: Secondary | ICD-10-CM | POA: Insufficient documentation

## 2015-08-17 DIAGNOSIS — I10 Essential (primary) hypertension: Secondary | ICD-10-CM | POA: Diagnosis not present

## 2015-08-17 DIAGNOSIS — R0602 Shortness of breath: Secondary | ICD-10-CM | POA: Insufficient documentation

## 2015-08-17 DIAGNOSIS — I2510 Atherosclerotic heart disease of native coronary artery without angina pectoris: Secondary | ICD-10-CM | POA: Insufficient documentation

## 2015-08-17 DIAGNOSIS — Z7982 Long term (current) use of aspirin: Secondary | ICD-10-CM | POA: Diagnosis not present

## 2015-08-17 DIAGNOSIS — E119 Type 2 diabetes mellitus without complications: Secondary | ICD-10-CM | POA: Diagnosis not present

## 2015-08-17 DIAGNOSIS — Z794 Long term (current) use of insulin: Secondary | ICD-10-CM | POA: Insufficient documentation

## 2015-08-17 DIAGNOSIS — N183 Chronic kidney disease, stage 3 unspecified: Secondary | ICD-10-CM

## 2015-08-17 DIAGNOSIS — R0789 Other chest pain: Secondary | ICD-10-CM | POA: Diagnosis not present

## 2015-08-17 DIAGNOSIS — Z510 Encounter for antineoplastic radiation therapy: Secondary | ICD-10-CM | POA: Diagnosis not present

## 2015-08-17 DIAGNOSIS — I5032 Chronic diastolic (congestive) heart failure: Secondary | ICD-10-CM

## 2015-08-17 DIAGNOSIS — E0811 Diabetes mellitus due to underlying condition with ketoacidosis with coma: Secondary | ICD-10-CM

## 2015-08-17 DIAGNOSIS — Z8679 Personal history of other diseases of the circulatory system: Secondary | ICD-10-CM

## 2015-08-17 DIAGNOSIS — R079 Chest pain, unspecified: Secondary | ICD-10-CM | POA: Diagnosis not present

## 2015-08-17 DIAGNOSIS — K219 Gastro-esophageal reflux disease without esophagitis: Secondary | ICD-10-CM

## 2015-08-17 DIAGNOSIS — Z87891 Personal history of nicotine dependence: Secondary | ICD-10-CM | POA: Diagnosis not present

## 2015-08-17 DIAGNOSIS — Z7984 Long term (current) use of oral hypoglycemic drugs: Secondary | ICD-10-CM | POA: Diagnosis not present

## 2015-08-17 DIAGNOSIS — C61 Malignant neoplasm of prostate: Secondary | ICD-10-CM | POA: Diagnosis not present

## 2015-08-17 DIAGNOSIS — E1122 Type 2 diabetes mellitus with diabetic chronic kidney disease: Secondary | ICD-10-CM

## 2015-08-17 LAB — BASIC METABOLIC PANEL
ANION GAP: 9 (ref 5–15)
BUN: 15 mg/dL (ref 6–20)
CO2: 24 mmol/L (ref 22–32)
Calcium: 8.7 mg/dL — ABNORMAL LOW (ref 8.9–10.3)
Chloride: 106 mmol/L (ref 101–111)
Creatinine, Ser: 1.45 mg/dL — ABNORMAL HIGH (ref 0.61–1.24)
GFR, EST AFRICAN AMERICAN: 56 mL/min — AB (ref >60)
GFR, EST NON AFRICAN AMERICAN: 48 mL/min — AB (ref >60)
Glucose, Bld: 134 mg/dL — ABNORMAL HIGH (ref 65–99)
POTASSIUM: 4.1 mmol/L (ref 3.5–5.1)
SODIUM: 139 mmol/L (ref 135–145)

## 2015-08-17 LAB — TROPONIN I
Troponin I: <0.03 ng/mL (ref <0.03)
Troponin I: <0.03 ng/mL (ref <0.03)

## 2015-08-17 LAB — GLUCOSE, CAPILLARY: Glucose-Capillary: 278 mg/dL — ABNORMAL HIGH (ref 65–99)

## 2015-08-17 LAB — CBC
HEMATOCRIT: 38.6 % — AB (ref 39.0–52.0)
HEMOGLOBIN: 12.8 g/dL — AB (ref 13.0–17.0)
MCH: 31.1 pg (ref 26.0–34.0)
MCHC: 33.2 g/dL (ref 30.0–36.0)
MCV: 93.9 fL (ref 78.0–100.0)
Platelets: 255 10*3/uL (ref 150–400)
RBC: 4.11 MIL/uL — ABNORMAL LOW (ref 4.22–5.81)
RDW: 13 % (ref 11.5–15.5)
WBC: 9.4 10*3/uL (ref 4.0–10.5)

## 2015-08-17 MED ORDER — METFORMIN HCL 500 MG PO TABS: 1000.0000 mg | ORAL_TABLET | Freq: Two times a day (BID) | ORAL | Status: DC

## 2015-08-17 MED ORDER — MORPHINE SULFATE (PF) 2 MG/ML IV SOLN: 2.0000 mg | INTRAVENOUS | Status: DC | PRN

## 2015-08-17 MED ORDER — GLIPIZIDE 5 MG PO TABS: 10.0000 mg | ORAL_TABLET | Freq: Two times a day (BID) | ORAL | Status: DC

## 2015-08-17 MED ORDER — PRAVASTATIN SODIUM 40 MG PO TABS
40.0000 mg | ORAL_TABLET | Freq: Every day | ORAL | Status: DC
  Administered 2015-08-17: 40 mg via ORAL
  Filled 2015-08-17: qty 1

## 2015-08-17 MED ORDER — METOPROLOL TARTRATE 25 MG PO TABS
25.0000 mg | ORAL_TABLET | Freq: Every day | ORAL | Status: DC
  Administered 2015-08-18: 25 mg via ORAL
  Filled 2015-08-17: qty 1

## 2015-08-17 MED ORDER — ENOXAPARIN SODIUM 40 MG/0.4ML ~~LOC~~ SOLN
40.0000 mg | SUBCUTANEOUS | Status: DC
  Administered 2015-08-17: 40 mg via SUBCUTANEOUS
  Filled 2015-08-17: qty 0.4

## 2015-08-17 MED ORDER — GI COCKTAIL ~~LOC~~: 30.0000 mL | Freq: Four times a day (QID) | ORAL | Status: DC | PRN

## 2015-08-17 MED ORDER — INSULIN ASPART 100 UNIT/ML ~~LOC~~ SOLN
0.0000 [IU] | Freq: Every day | SUBCUTANEOUS | Status: DC
  Administered 2015-08-17: 3 [IU] via SUBCUTANEOUS

## 2015-08-17 MED ORDER — ACETAMINOPHEN 325 MG PO TABS
650.0000 mg | ORAL_TABLET | ORAL | Status: DC | PRN
  Filled 2015-08-17: qty 2

## 2015-08-17 MED ORDER — ASPIRIN EC 81 MG PO TBEC
81.0000 mg | DELAYED_RELEASE_TABLET | Freq: Every morning | ORAL | Status: DC
  Administered 2015-08-18: 81 mg via ORAL
  Filled 2015-08-17: qty 1

## 2015-08-17 MED ORDER — ONDANSETRON HCL 4 MG/2ML IJ SOLN: 4.0000 mg | Freq: Four times a day (QID) | INTRAMUSCULAR | Status: DC | PRN

## 2015-08-17 MED ORDER — GLIPIZIDE 5 MG PO TABS
10.0000 mg | ORAL_TABLET | Freq: Two times a day (BID) | ORAL | Status: DC
  Administered 2015-08-18: 10 mg via ORAL
  Filled 2015-08-17: qty 2

## 2015-08-17 MED ORDER — NITROGLYCERIN 0.4 MG SL SUBL: 0.4000 mg | SUBLINGUAL_TABLET | SUBLINGUAL | Status: DC | PRN

## 2015-08-17 MED ORDER — INSULIN GLARGINE 100 UNIT/ML ~~LOC~~ SOLN
75.0000 [IU] | Freq: Every day | SUBCUTANEOUS | Status: DC
  Administered 2015-08-17: 75 [IU] via SUBCUTANEOUS
  Filled 2015-08-17 (×3): qty 0.75

## 2015-08-17 MED ORDER — INSULIN ASPART 100 UNIT/ML ~~LOC~~ SOLN: 0.0000 [IU] | Freq: Three times a day (TID) | SUBCUTANEOUS | Status: DC

## 2015-08-17 MED ORDER — ASPIRIN 81 MG PO CHEW: 324.0000 mg | CHEWABLE_TABLET | Freq: Once | ORAL | Status: DC

## 2015-08-17 MED ORDER — LISINOPRIL 10 MG PO TABS
10.0000 mg | ORAL_TABLET | Freq: Every day | ORAL | Status: DC
  Administered 2015-08-18: 10 mg via ORAL
  Filled 2015-08-17: qty 1

## 2015-08-17 MED ORDER — CLOPIDOGREL BISULFATE 75 MG PO TABS
75.0000 mg | ORAL_TABLET | Freq: Every day | ORAL | Status: DC
Start: 2015-08-18 — End: 2015-08-18
  Administered 2015-08-18: 75 mg via ORAL
  Filled 2015-08-17: qty 1

## 2015-08-17 MED ORDER — TAMSULOSIN HCL 0.4 MG PO CAPS
0.4000 mg | ORAL_CAPSULE | Freq: Two times a day (BID) | ORAL | Status: DC
  Administered 2015-08-17 – 2015-08-18 (×2): 0.4 mg via ORAL
  Filled 2015-08-17 (×2): qty 1

## 2015-08-17 MED ORDER — HYDROCODONE-ACETAMINOPHEN 5-325 MG PO TABS
1.0000 | ORAL_TABLET | Freq: Four times a day (QID) | ORAL | Status: DC | PRN
  Administered 2015-08-17: 1 via ORAL
  Filled 2015-08-17: qty 1

## 2015-08-17 MED ORDER — METFORMIN HCL 500 MG PO TABS
1000.0000 mg | ORAL_TABLET | Freq: Two times a day (BID) | ORAL | Status: DC
  Administered 2015-08-18: 1000 mg via ORAL
  Filled 2015-08-17: qty 2

## 2015-08-17 MED ORDER — ALPRAZOLAM 1 MG PO TABS
1.0000 mg | ORAL_TABLET | Freq: Four times a day (QID) | ORAL | Status: DC | PRN
  Administered 2015-08-17: 1 mg via ORAL
  Filled 2015-08-17: qty 1

## 2015-08-17 MED ORDER — FUROSEMIDE 20 MG PO TABS
20.0000 mg | ORAL_TABLET | Freq: Two times a day (BID) | ORAL | Status: DC
  Administered 2015-08-18: 20 mg via ORAL
  Filled 2015-08-17: qty 1

## 2015-08-17 MED ORDER — VITAMIN B-12 1000 MCG PO TABS
1000.0000 ug | ORAL_TABLET | Freq: Every day | ORAL | Status: DC
  Administered 2015-08-18: 1000 ug via ORAL
  Filled 2015-08-17: qty 1

## 2015-08-17 NOTE — ED Notes (Signed)
Dr. Steinl at bedside 
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
## 2015-08-17 NOTE — ED Triage Notes (Signed)
Pt comes in today by EMS for chest pain. He walked to the mail box and came back to lay down when he had an episode of tightness in his chest. Pt took 4 baby aspirins at home and 1 nitro at home. Pressure is unchanged.   Hx of stent placement 2 years ago.
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
## 2015-08-17 NOTE — ED Triage Notes (Signed)
CBG 127 

## 2015-08-17 NOTE — ED Notes (Signed)
Hospitalist at bedside 

## 2015-08-18 DIAGNOSIS — R0789 Other chest pain: Secondary | ICD-10-CM

## 2015-08-18 DIAGNOSIS — Z8679 Personal history of other diseases of the circulatory system: Secondary | ICD-10-CM

## 2015-08-18 DIAGNOSIS — I5032 Chronic diastolic (congestive) heart failure: Secondary | ICD-10-CM

## 2015-08-18 DIAGNOSIS — E0810 Diabetes mellitus due to underlying condition with ketoacidosis without coma: Secondary | ICD-10-CM

## 2015-08-18 DIAGNOSIS — N183 Chronic kidney disease, stage 3 unspecified: Secondary | ICD-10-CM

## 2015-08-18 DIAGNOSIS — K219 Gastro-esophageal reflux disease without esophagitis: Secondary | ICD-10-CM

## 2015-08-18 DIAGNOSIS — E1122 Type 2 diabetes mellitus with diabetic chronic kidney disease: Secondary | ICD-10-CM

## 2015-08-18 DIAGNOSIS — I10 Essential (primary) hypertension: Secondary | ICD-10-CM | POA: Diagnosis not present

## 2015-08-18 LAB — GLUCOSE, CAPILLARY: Glucose-Capillary: 150 mg/dL — ABNORMAL HIGH (ref 65–99)

## 2015-08-18 LAB — TROPONIN I

## 2015-08-18 MED ORDER — PANTOPRAZOLE SODIUM 20 MG PO TBEC: 20.0000 mg | DELAYED_RELEASE_TABLET | Freq: Every day | ORAL | 1 refills | Status: DC

## 2015-08-18 NOTE — Progress Notes (Signed)
Pt discharged to home, taken via wheelchair to car. Pt denies pain, he is in no distress with stable vitals. All personal belongings sent with patient. IV access removed, telemetry monitor removed. Discharge instructions given, pt verbalized understanding. Pt to f/u with Dr. Raliegh Ip in 10 days, informed patient that Protonix prescription was available at [facility] on [address], and it was sent electronically. Granddaughter took patient home.

## 2015-08-18 NOTE — Discharge Summary (Signed)
Physician Discharge Summary  William [name] [id number] DOB: 16-Dec-1945 DOA: 08/17/2015  PCP: [name], MD  Admit date: 08/17/2015 Discharge date: 08/18/2015  Time spent: 35 minutes  Recommendations for Outpatient Follow-up:  1. Reassess blood pressure 2. Repeat basic metabolic panel to assess electrolytes and renal function trend   Discharge Diagnoses:  Principal Problem:   Chest pain Active Problems:   Hyperlipidemia   DM (diabetes mellitus) (HCC)   HTN (hypertension)   Coronary artery disease   History of coronary artery disease   Esophageal reflux   Chronic diastolic heart failure (HCC)   CKD stage 3 due to type 2 diabetes mellitus ([place])   Discharge Condition: Stable and improved. Patient instructed to arrange follow-up with his cardiologist in 10 days and to follow on 08/21/15 with PCP as previously scheduled.   Diet recommendation: Heart healthy diet and modify carbohydrates  Filed Weights   08/17/15 1618 08/17/15 2052  Weight: 108.9 kg (240 lb) 110.7 kg (244 lb)    History of present illness:  As per Dr. Nehemiah Settle H&P written on 08/17/2015 70 y.o. male with a history of coronary artery disease status post stent to the RCA on 08/10/13 with residual mild to moderate disease in the LAD and the left circumflex and continued on dual anticoagulation therapy, insulin-dependent type 2 diabetes, hypertension, prostate cancer currently undergoing radiation treatment, GERD and hyperlipidemia. Patient started having chest pressure with dyspnea around 4 PM on the day of admission; that worsens with laying down down and activity; symptoms improved with sitting up and rest. These symptoms lasted for a couple of hours, then improved spontaneously. The patient took 2 doses of nitroglycerin and 4 baby aspirins without any improvement of his symptoms. He denies cough, sputum production, wheezing, fevers, chills, diaphoresis. Admitted for ACS rule out.  Hospital Course:  #1 chest pain:  Patient with a heart score of 4-5 -No further chest pain or shortness of breath -EKG and telemetry without acute ischemic changes -Negative troponins 4 -Recent 2-D echo in May 2017 with preserved ejection fraction, no wall motion abnormalities at that time. -Will continue dual therapy with aspirin and Plavix, continue ACE inhibitor, statins, heart healthy diet and outpatient follow-up with his cardiologist to determine need of further extremities indication.  #2 GERD: -Patient without medications prior to admission -Will discharge on Protonix daily -Lifestyle instructions discussed especially for no eating and laying down immediately and to use extra pillows to maintain head elevation at bedtime.  #3 diabetes mellitus type 2: With nephropathy -Will continue home hypoglycemic regimen -Patient advised to follow low carbohydrate diet  #4 chronic kidney disease of stage III -Stable and at baseline -Recommend follow-up with basic metabolic panel to assess trend and electrolytes  #5 hypertension -Stable and well control -Advised to follow a heart healthy diet -Will continue current antihypertensive regimen  #6 hyperlipidemia -Will continue statins  #7 chronic diastolic heart failure grade 1 -Compensated -Advised to follow a heart healthy diet and to check his weight on daily basis -Continue blood pressure control  Procedures:  See below for x-ray reports  Consultations:  None  Discharge Exam: Vitals:   08/17/15 2052 08/18/15 0635  BP: 138/60 124/72  Pulse: 79 63  Resp: 18 20  Temp: 98.6 F (37 C) 97.6 F (36.4 C)    General: Afebrile, denies any further chest pain and shortness of breath. Cardiovascular: Regular rate and rhythm, no rubs, no gallops Respiratory: clear to auscultation bilaterally abdomen: Soft, nontender, positive bowel sounds Extremities: No edema, no  cyanosis, no clubbing  Discharge Instructions   Discharge Instructions    Diet - low sodium  heart healthy    Complete by:  As directed   Discharge instructions    Complete by:  As directed   Follow heart healthy and low sodium diet Take medications as prescribed Check weight on daily basis Maintain adequate hydration Arrange follow up with cardiologist in 10 days   Increase activity slowly    Complete by:  As directed     Current Discharge Medication List    START taking these medications   Details  pantoprazole (PROTONIX) 20 MG tablet Take 1 tablet (20 mg total) by mouth daily. Qty: 30 tablet, Refills: 1      CONTINUE these medications which have NOT CHANGED   Details  ALPRAZolam (XANAX) 1 MG tablet Take 1 mg by mouth 4 (four) times daily as needed. For anxiety    aspirin EC 81 MG tablet Take 81 mg by mouth every morning.    clopidogrel (PLAVIX) 75 MG tablet TAKE 1 TABLET BY MOUTH EVERY DAY WITH BREAKFAST Qty: 30 tablet, Refills: 11    Coenzyme Q10 (CO Q 10 PO) Take 1 capsule by mouth daily.    furosemide (LASIX) 20 MG tablet Take 20 mg by mouth 2 (two) times daily.  Refills: 0    glipiZIDE (GLUCOTROL) 10 MG tablet Take 10 mg by mouth 2 (two) times daily.    HYDROcodone-acetaminophen (NORCO) 10-325 MG tablet Take 1 tablet by mouth every 4 (four) hours as needed for moderate pain.     LANTUS SOLOSTAR 100 UNIT/ML Solostar Pen Inject 75 Units into the skin at bedtime. Patient takes 50 to 60 units based on blood sugar levels over 190    lisinopril (PRINIVIL,ZESTRIL) 10 MG tablet Take 1 tablet (10 mg total) by mouth daily. Qty: 90 tablet, Refills: 3    metFORMIN (GLUCOPHAGE) 1000 MG tablet Take 1,000 mg by mouth 2 (two) times daily. Refills: 3    metoprolol tartrate (LOPRESSOR) 25 MG tablet Take 1 tablet (25 mg total) by mouth daily. Qty: 30 tablet, Refills: 11    nitroGLYCERIN (NITROSTAT) 0.4 MG SL tablet Place 1 tablet (0.4 mg total) under the tongue every 5 (five) minutes as needed for chest pain. Qty: 25 tablet, Refills: 12    pravastatin (PRAVACHOL) 40 MG  tablet Take 40 mg by mouth at bedtime. Refills: 3    tamsulosin (FLOMAX) 0.4 MG CAPS capsule Take 1 capsule by mouth 2 (two) times daily.     vitamin B-12 (CYANOCOBALAMIN) 1000 MCG tablet Take 1,000 mcg by mouth daily. Reported on 02/26/2015    Vitamin D, Ergocalciferol, (DRISDOL) 50000 units CAPS capsule Take 50,000 Units by mouth every 30 (thirty) days.       No Known Allergies Follow-up Information    [name], MD Follow up on 08/22/2015.   Specialty:  Internal Medicine Why:  as previously schedule  Contact information: [address] [place] [id number] [phone]        Kate Sable, MD. Schedule an appointment as soon as possible for a visit in 10 day(s).   Specialty:  Cardiology Contact information: [place] [address] [phone]            The results of significant diagnostics from this hospitalization (including imaging, microbiology, ancillary and laboratory) are listed below for reference.    Significant Diagnostic Studies: Dg Chest 2 View  Result Date: 08/17/2015 CLINICAL DATA:  Onset of chest tightness with shortness  of breath when lying down for and at this afternoon; history of hypertension, diabetes, former smoker, coronary artery disease with stent placement. EXAM: CHEST  2 VIEW COMPARISON:  PA and lateral chest x-ray of February 26, 2015 FINDINGS: The lungs are well-expanded and clear. The heart is top-normal in size but stable. The mediastinum is normal in width. There is calcification in the wall of the aortic arch. There is no pleural effusion or pneumothorax. There is degenerative disc disease at multiple thoracic vertebral levels. IMPRESSION: There is no evidence of pneumonia, pulmonary edema, nor other acute cardiopulmonary abnormality. Aortic atherosclerosis. Electronically Signed   By: David  Martinique M.D.   On: 08/17/2015 16:53   Labs: Basic Metabolic Panel:  Recent Labs Lab 08/17/15 1616  NA 139  K 4.1   CL 106  CO2 24  GLUCOSE 134*  BUN 15  CREATININE 1.45*  CALCIUM 8.7*   CBC:  Recent Labs Lab 08/17/15 1616  WBC 9.4  HGB 12.8*  HCT 38.6*  MCV 93.9  PLT 255   Cardiac Enzymes:  Recent Labs Lab 08/17/15 1616 08/17/15 2103 08/18/15 0315 08/18/15 0903  TROPONINI <0.03 <0.03 <0.03 <0.03   CBG:  Recent Labs Lab 08/17/15 2125 08/18/15 0802  GLUCAP 278* 150*    Signed:  Barton Dubois MD.  [facility] 08/18/2015, 10:07 AM

## 2015-08-20 DIAGNOSIS — Z510 Encounter for antineoplastic radiation therapy: Secondary | ICD-10-CM | POA: Diagnosis not present

## 2015-08-20 DIAGNOSIS — C61 Malignant neoplasm of prostate: Secondary | ICD-10-CM | POA: Diagnosis not present

## 2015-08-21 DIAGNOSIS — Z510 Encounter for antineoplastic radiation therapy: Secondary | ICD-10-CM | POA: Diagnosis not present

## 2015-08-21 DIAGNOSIS — C61 Malignant neoplasm of prostate: Secondary | ICD-10-CM | POA: Diagnosis not present

## 2015-08-22 DIAGNOSIS — C61 Malignant neoplasm of prostate: Secondary | ICD-10-CM | POA: Diagnosis not present

## 2015-08-22 DIAGNOSIS — Z6833 Body mass index (BMI) 33.0-33.9, adult: Secondary | ICD-10-CM | POA: Diagnosis not present

## 2015-08-22 DIAGNOSIS — Z510 Encounter for antineoplastic radiation therapy: Secondary | ICD-10-CM | POA: Diagnosis not present

## 2015-08-22 DIAGNOSIS — E1129 Type 2 diabetes mellitus with other diabetic kidney complication: Secondary | ICD-10-CM | POA: Diagnosis not present

## 2015-08-22 DIAGNOSIS — R079 Chest pain, unspecified: Secondary | ICD-10-CM | POA: Diagnosis not present

## 2015-08-22 DIAGNOSIS — M1991 Primary osteoarthritis, unspecified site: Secondary | ICD-10-CM | POA: Diagnosis not present

## 2015-08-23 DIAGNOSIS — Z510 Encounter for antineoplastic radiation therapy: Secondary | ICD-10-CM | POA: Diagnosis not present

## 2015-08-23 DIAGNOSIS — C61 Malignant neoplasm of prostate: Secondary | ICD-10-CM | POA: Diagnosis not present

## 2015-08-24 DIAGNOSIS — Z510 Encounter for antineoplastic radiation therapy: Secondary | ICD-10-CM | POA: Diagnosis not present

## 2015-08-24 DIAGNOSIS — C61 Malignant neoplasm of prostate: Secondary | ICD-10-CM | POA: Diagnosis not present

## 2015-08-27 ENCOUNTER — Ambulatory Visit (INDEPENDENT_AMBULATORY_CARE_PROVIDER_SITE_OTHER): Payer: Medicare Other | Admitting: Adult Health

## 2015-08-27 ENCOUNTER — Encounter: Payer: Self-pay | Admitting: Adult Health

## 2015-08-27 VITALS — BP 104/62 | HR 81 | Ht 69.5 in | Wt 243.0 lb

## 2015-08-27 DIAGNOSIS — I10 Essential (primary) hypertension: Secondary | ICD-10-CM

## 2015-08-27 DIAGNOSIS — I5032 Chronic diastolic (congestive) heart failure: Secondary | ICD-10-CM

## 2015-08-27 DIAGNOSIS — I2510 Atherosclerotic heart disease of native coronary artery without angina pectoris: Secondary | ICD-10-CM

## 2015-08-27 DIAGNOSIS — Z510 Encounter for antineoplastic radiation therapy: Secondary | ICD-10-CM | POA: Diagnosis not present

## 2015-08-27 DIAGNOSIS — C61 Malignant neoplasm of prostate: Secondary | ICD-10-CM | POA: Diagnosis not present

## 2015-08-27 NOTE — Progress Notes (Signed)
Name: William [name]    DOB: 1945-08-06  Age: 70 y.o.  MR#: [id number]       PCP:  [name], MD      Insurance: Payor: MEDICARE / Plan: MEDICARE PART A AND B / Product Type: *No Product type* /   CC:   No chief complaint on file.   VS Vitals:   08/27/15 1533  Pulse: 81  SpO2: 90%  Weight: 243 lb (110.2 kg)  Height: 5' 9.5" (1.765 m)    Weights Current Weight  08/27/15 243 lb (110.2 kg)  08/17/15 244 lb (110.7 kg)  05/09/15 241 lb (109.3 kg)    Blood Pressure  BP Readings from Last 3 Encounters:  08/18/15 124/72  05/09/15 110/60  02/28/15 (!) 103/55     Admit date:  (Not on file) Last encounter with RMR:  Visit date not found   Allergy Review of patient's allergies indicates no known allergies.  Current Outpatient Prescriptions  Medication Sig Dispense Refill  . ALPRAZolam (XANAX) 1 MG tablet Take 1 mg by mouth 4 (four) times daily as needed. For anxiety    . aspirin EC 81 MG tablet Take 81 mg by mouth every morning.    . clopidogrel (PLAVIX) 75 MG tablet TAKE 1 TABLET BY MOUTH EVERY DAY WITH BREAKFAST 30 tablet 11  . Coenzyme Q10 (CO Q 10 PO) Take 1 capsule by mouth daily.    . furosemide (LASIX) 20 MG tablet Take 20 mg by mouth 2 (two) times daily.   0  . glipiZIDE (GLUCOTROL) 10 MG tablet Take 10 mg by mouth 2 (two) times daily.    Marland Kitchen HYDROcodone-acetaminophen (NORCO) 10-325 MG tablet Take 1 tablet by mouth every 4 (four) hours as needed for moderate pain.     Marland Kitchen LANTUS SOLOSTAR 100 UNIT/ML Solostar Pen Inject 85 Units into the skin at bedtime. Patient takes 50 to 60 units based on blood sugar levels over 190    . lisinopril (PRINIVIL,ZESTRIL) 10 MG tablet Take 1 tablet (10 mg total) by mouth daily. 90 tablet 3  . metFORMIN (GLUCOPHAGE) 1000 MG tablet Take 1,000 mg by mouth 2 (two) times daily.  3  . metoprolol tartrate (LOPRESSOR) 25 MG tablet Take 1 tablet (25 mg total) by mouth daily. 30 tablet 11  . nitroGLYCERIN (NITROSTAT) 0.4 MG SL tablet Place 1 tablet  (0.4 mg total) under the tongue every 5 (five) minutes as needed for chest pain. 25 tablet 12  . pantoprazole (PROTONIX) 20 MG tablet Take 1 tablet (20 mg total) by mouth daily. 30 tablet 1  . pravastatin (PRAVACHOL) 40 MG tablet Take 40 mg by mouth at bedtime.  3  . tamsulosin (FLOMAX) 0.4 MG CAPS capsule Take 1 capsule by mouth 2 (two) times daily.     . vitamin B-12 (CYANOCOBALAMIN) 1000 MCG tablet Take 1,000 mcg by mouth daily. Reported on 02/26/2015    . Vitamin D, Ergocalciferol, (DRISDOL) 50000 units CAPS capsule Take 50,000 Units by mouth every 30 (thirty) days.     No current facility-administered medications for this visit.     Discontinued Meds:   There are no discontinued medications.  Patient Active Problem List   Diagnosis Date Noted  . History of coronary artery disease   . Esophageal reflux   . Chronic diastolic heart failure ([place])   . CKD stage 3 due to type 2 diabetes mellitus ([place])   . Abdominal wall abscess at site of surgical wound 02/26/2015  . Abscess of abdominal wall  02/26/2015  . Coronary artery disease 08/24/2013  . Chest pain 08/10/2013  . Hyperlipidemia 08/10/2013  . DM (diabetes mellitus) ([place]) 08/10/2013  . HTN (hypertension) 08/10/2013  . Solitary pulmonary nodule 08/10/2013    LABS    Component Value Date/Time   NA 139 08/17/2015 1616   NA 140 02/26/2015 0709   NA 136 10/21/2014 2044   K 4.1 08/17/2015 1616   K 3.7 02/26/2015 0709   K 4.7 10/21/2014 2044   CL 106 08/17/2015 1616   CL 102 02/26/2015 0709   CL 98 (L) 10/21/2014 2044   CO2 24 08/17/2015 1616   CO2 26 02/26/2015 0709   CO2 27 12/07/2013 0518   GLUCOSE 134 (H) 08/17/2015 1616   GLUCOSE 115 (H) 02/26/2015 0709   GLUCOSE 260 (H) 10/21/2014 2044   BUN 15 08/17/2015 1616   BUN 17 02/26/2015 0709   BUN 27 (H) 10/21/2014 2044   CREATININE 1.45 (H) 08/17/2015 1616   CREATININE 1.56 (H) 02/26/2015 0709   CREATININE 2.00 (H) 10/21/2014 2044   CALCIUM 8.7 (L) 08/17/2015 1616    CALCIUM 9.2 02/26/2015 0709   CALCIUM 9.5 12/07/2013 0518   GFRNONAA 48 (L) 08/17/2015 1616   GFRNONAA 44 (L) 02/26/2015 0709   GFRNONAA 60 (L) 12/07/2013 0518   GFRAA 56 (L) 08/17/2015 1616   GFRAA 51 (L) 02/26/2015 0709   GFRAA 70 (L) 12/07/2013 0518   CMP     Component Value Date/Time   NA 139 08/17/2015 1616   K 4.1 08/17/2015 1616   CL 106 08/17/2015 1616   CO2 24 08/17/2015 1616   GLUCOSE 134 (H) 08/17/2015 1616   BUN 15 08/17/2015 1616   CREATININE 1.45 (H) 08/17/2015 1616   CALCIUM 8.7 (L) 08/17/2015 1616   PROT 7.5 02/26/2015 0709   ALBUMIN 3.7 02/26/2015 0709   AST 16 02/26/2015 0709   ALT 16 (L) 02/26/2015 0709   ALKPHOS 39 02/26/2015 0709   BILITOT 0.4 02/26/2015 0709   GFRNONAA 48 (L) 08/17/2015 1616   GFRAA 56 (L) 08/17/2015 1616       Component Value Date/Time   WBC 9.4 08/17/2015 1616   WBC 11.9 (H) 02/27/2015 2245   WBC 12.9 (H) 02/26/2015 0709   HGB 12.8 (L) 08/17/2015 1616   HGB 11.5 (L) 02/27/2015 2245   HGB 13.0 02/26/2015 0709   HCT 38.6 (L) 08/17/2015 1616   HCT 33.9 (L) 02/27/2015 2245   HCT 38.3 (L) 02/26/2015 0709   MCV 93.9 08/17/2015 1616   MCV 90.6 02/27/2015 2245   MCV 91.6 02/26/2015 0709    Lipid Panel     Component Value Date/Time   CHOL  10/27/2007 0900    122        ATP III CLASSIFICATION:  <200     mg/dL   Desirable  200-239  mg/dL   Borderline High  >=240    mg/dL   High   TRIG 76 10/27/2007 0900   HDL 39 (L) 10/27/2007 0900   CHOLHDL 3.1 10/27/2007 0900   VLDL 15 10/27/2007 0900   LDLCALC  10/27/2007 0900    68        Total Cholesterol/HDL:CHD Risk Coronary Heart Disease Risk Table                     Men   Women  1/2 Average Risk   3.4   3.3    ABG    Component Value Date/Time   TCO2 25 10/21/2014 2044  No results found for: TSH BNP (last 3 results) No results for input(s): BNP in the last 8760 hours.  ProBNP (last 3 results) No results for input(s): PROBNP in the last 8760 hours.  Cardiac Panel  (last 3 results) No results for input(s): CKTOTAL, CKMB, TROPONINI, RELINDX in the last 72 hours.  Iron/TIBC/Ferritin/ %Sat No results found for: IRON, TIBC, FERRITIN, IRONPCTSAT   EKG Orders placed or performed in visit on 08/17/15  . EKG 12-Lead     Prior Assessment and Plan Problem List as of 08/27/2015 Reviewed: 05/09/2015 10:31 AM by Kate Sable, MD     Cardiovascular and Mediastinum   HTN (hypertension)   Last Assessment & Plan 08/24/2013 Office Visit Written 08/24/2013 11:24 AM by Imogene Burn, PA-C    Blood pressures are low on the high side. Add Toprol.      Coronary artery disease   Last Assessment & Plan 08/24/2013 Office Visit Written 08/24/2013 11:24 AM by Imogene Burn, PA-C    Patient had recent drug-eluting stent to the RCA on 08/10/13. He has residual 40% LAD and circumflex stenosis. He is asymptomatic. Will start low-dose Toprol XL 25 mg once daily. Continue aspirin and Plavix. Decrease caffeine intake. Follow up with Dr.Koneswaran in 2 months.      Chronic diastolic heart failure (HCC)     Digestive   Esophageal reflux     Endocrine   DM (diabetes mellitus) ([place])     Genitourinary   CKD stage 3 due to type 2 diabetes mellitus [facility])     Other   Chest pain   Hyperlipidemia   Last Assessment & Plan 08/24/2013 Office Visit Written 08/24/2013 11:25 AM by Imogene Burn, PA-C    On Crestor      Solitary pulmonary nodule   Last Assessment & Plan 08/24/2013 Office Visit Edited 08/24/2013 11:25 AM by Imogene Burn, PA-C    Followup CT scan recommended in 1 year. Patient is aware.      Abdominal wall abscess at site of surgical wound   Abscess of abdominal wall   History of coronary artery disease       Imaging: Dg Chest 2 View  Result Date: 08/17/2015 CLINICAL DATA:  Onset of chest tightness with shortness of breath when lying down for and at this afternoon; history of hypertension, diabetes, former smoker, coronary artery disease with stent  placement. EXAM: CHEST  2 VIEW COMPARISON:  PA and lateral chest x-ray of February 26, 2015 FINDINGS: The lungs are well-expanded and clear. The heart is top-normal in size but stable. The mediastinum is normal in width. There is calcification in the wall of the aortic arch. There is no pleural effusion or pneumothorax. There is degenerative disc disease at multiple thoracic vertebral levels. IMPRESSION: There is no evidence of pneumonia, pulmonary edema, nor other acute cardiopulmonary abnormality. Aortic atherosclerosis. Electronically Signed   By: David  Martinique M.D.   On: 08/17/2015 16:53
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
## 2015-08-27 NOTE — Patient Instructions (Signed)
Your physician wants you to follow-up in: 3 Months with Dr. Bronson Ing. You will receive a reminder letter in the mail two months in advance. If you don't receive a letter, please call our office to schedule the follow-up appointment.  Your physician recommends that you continue on your current medications as directed. Please refer to the Current Medication list given to you today.  If you need a refill on your cardiac medications before your next appointment, please call your pharmacy.  Thank you for choosing [facility]!

## 2015-08-28 DIAGNOSIS — C61 Malignant neoplasm of prostate: Secondary | ICD-10-CM | POA: Diagnosis not present

## 2015-08-28 DIAGNOSIS — Z510 Encounter for antineoplastic radiation therapy: Secondary | ICD-10-CM | POA: Diagnosis not present

## 2015-08-29 DIAGNOSIS — E119 Type 2 diabetes mellitus without complications: Secondary | ICD-10-CM | POA: Diagnosis not present

## 2015-08-29 DIAGNOSIS — C61 Malignant neoplasm of prostate: Secondary | ICD-10-CM | POA: Diagnosis not present

## 2015-08-29 DIAGNOSIS — Z510 Encounter for antineoplastic radiation therapy: Secondary | ICD-10-CM | POA: Diagnosis not present

## 2015-08-30 DIAGNOSIS — Z510 Encounter for antineoplastic radiation therapy: Secondary | ICD-10-CM | POA: Diagnosis not present

## 2015-08-30 DIAGNOSIS — C61 Malignant neoplasm of prostate: Secondary | ICD-10-CM | POA: Diagnosis not present

## 2015-08-31 DIAGNOSIS — C61 Malignant neoplasm of prostate: Secondary | ICD-10-CM | POA: Diagnosis not present

## 2015-08-31 DIAGNOSIS — Z510 Encounter for antineoplastic radiation therapy: Secondary | ICD-10-CM | POA: Diagnosis not present

## 2015-09-03 DIAGNOSIS — Z510 Encounter for antineoplastic radiation therapy: Secondary | ICD-10-CM | POA: Diagnosis not present

## 2015-09-03 DIAGNOSIS — C61 Malignant neoplasm of prostate: Secondary | ICD-10-CM | POA: Diagnosis not present

## 2015-09-04 DIAGNOSIS — C61 Malignant neoplasm of prostate: Secondary | ICD-10-CM | POA: Diagnosis not present

## 2015-09-04 DIAGNOSIS — Z510 Encounter for antineoplastic radiation therapy: Secondary | ICD-10-CM | POA: Diagnosis not present

## 2015-09-04 DIAGNOSIS — E119 Type 2 diabetes mellitus without complications: Secondary | ICD-10-CM | POA: Diagnosis not present

## 2015-09-05 DIAGNOSIS — Z510 Encounter for antineoplastic radiation therapy: Secondary | ICD-10-CM | POA: Diagnosis not present

## 2015-09-05 DIAGNOSIS — C61 Malignant neoplasm of prostate: Secondary | ICD-10-CM | POA: Diagnosis not present

## 2015-09-06 DIAGNOSIS — C61 Malignant neoplasm of prostate: Secondary | ICD-10-CM | POA: Diagnosis not present

## 2015-09-06 DIAGNOSIS — Z510 Encounter for antineoplastic radiation therapy: Secondary | ICD-10-CM | POA: Diagnosis not present

## 2015-09-07 DIAGNOSIS — C61 Malignant neoplasm of prostate: Secondary | ICD-10-CM | POA: Diagnosis not present

## 2015-09-07 DIAGNOSIS — Z510 Encounter for antineoplastic radiation therapy: Secondary | ICD-10-CM | POA: Diagnosis not present

## 2015-09-11 DIAGNOSIS — C61 Malignant neoplasm of prostate: Secondary | ICD-10-CM | POA: Diagnosis not present

## 2015-09-11 DIAGNOSIS — Z510 Encounter for antineoplastic radiation therapy: Secondary | ICD-10-CM | POA: Diagnosis not present

## 2015-09-12 DIAGNOSIS — Z1389 Encounter for screening for other disorder: Secondary | ICD-10-CM | POA: Diagnosis not present

## 2015-09-12 DIAGNOSIS — Z0000 Encounter for general adult medical examination without abnormal findings: Secondary | ICD-10-CM | POA: Diagnosis not present

## 2015-09-12 DIAGNOSIS — Z6834 Body mass index (BMI) 34.0-34.9, adult: Secondary | ICD-10-CM | POA: Diagnosis not present

## 2015-09-12 DIAGNOSIS — Z510 Encounter for antineoplastic radiation therapy: Secondary | ICD-10-CM | POA: Diagnosis not present

## 2015-09-12 DIAGNOSIS — I10 Essential (primary) hypertension: Secondary | ICD-10-CM | POA: Diagnosis not present

## 2015-09-12 DIAGNOSIS — C61 Malignant neoplasm of prostate: Secondary | ICD-10-CM | POA: Diagnosis not present

## 2015-09-12 DIAGNOSIS — Z23 Encounter for immunization: Secondary | ICD-10-CM | POA: Diagnosis not present

## 2015-09-13 DIAGNOSIS — Z510 Encounter for antineoplastic radiation therapy: Secondary | ICD-10-CM | POA: Diagnosis not present

## 2015-09-13 DIAGNOSIS — C61 Malignant neoplasm of prostate: Secondary | ICD-10-CM | POA: Diagnosis not present

## 2015-09-14 DIAGNOSIS — Z510 Encounter for antineoplastic radiation therapy: Secondary | ICD-10-CM | POA: Diagnosis not present

## 2015-09-14 DIAGNOSIS — C61 Malignant neoplasm of prostate: Secondary | ICD-10-CM | POA: Diagnosis not present

## 2015-09-17 DIAGNOSIS — C61 Malignant neoplasm of prostate: Secondary | ICD-10-CM | POA: Diagnosis not present

## 2015-09-17 DIAGNOSIS — Z510 Encounter for antineoplastic radiation therapy: Secondary | ICD-10-CM | POA: Diagnosis not present

## 2015-09-18 DIAGNOSIS — Z510 Encounter for antineoplastic radiation therapy: Secondary | ICD-10-CM | POA: Diagnosis not present

## 2015-09-18 DIAGNOSIS — C61 Malignant neoplasm of prostate: Secondary | ICD-10-CM | POA: Diagnosis not present

## 2015-09-19 DIAGNOSIS — Z510 Encounter for antineoplastic radiation therapy: Secondary | ICD-10-CM | POA: Diagnosis not present

## 2015-09-19 DIAGNOSIS — C61 Malignant neoplasm of prostate: Secondary | ICD-10-CM | POA: Diagnosis not present

## 2015-09-20 DIAGNOSIS — C61 Malignant neoplasm of prostate: Secondary | ICD-10-CM | POA: Diagnosis not present

## 2015-09-20 DIAGNOSIS — Z510 Encounter for antineoplastic radiation therapy: Secondary | ICD-10-CM | POA: Diagnosis not present

## 2015-09-21 DIAGNOSIS — C61 Malignant neoplasm of prostate: Secondary | ICD-10-CM | POA: Diagnosis not present

## 2015-09-21 DIAGNOSIS — Z510 Encounter for antineoplastic radiation therapy: Secondary | ICD-10-CM | POA: Diagnosis not present

## 2015-09-24 DIAGNOSIS — C61 Malignant neoplasm of prostate: Secondary | ICD-10-CM | POA: Diagnosis not present

## 2015-09-24 DIAGNOSIS — Z510 Encounter for antineoplastic radiation therapy: Secondary | ICD-10-CM | POA: Diagnosis not present

## 2015-09-25 DIAGNOSIS — C61 Malignant neoplasm of prostate: Secondary | ICD-10-CM | POA: Diagnosis not present

## 2015-09-25 DIAGNOSIS — Z510 Encounter for antineoplastic radiation therapy: Secondary | ICD-10-CM | POA: Diagnosis not present

## 2015-09-26 DIAGNOSIS — E559 Vitamin D deficiency, unspecified: Secondary | ICD-10-CM | POA: Diagnosis not present

## 2015-09-26 DIAGNOSIS — C61 Malignant neoplasm of prostate: Secondary | ICD-10-CM | POA: Diagnosis not present

## 2015-09-26 DIAGNOSIS — E1122 Type 2 diabetes mellitus with diabetic chronic kidney disease: Secondary | ICD-10-CM | POA: Diagnosis not present

## 2015-09-26 DIAGNOSIS — N183 Chronic kidney disease, stage 3 (moderate): Secondary | ICD-10-CM | POA: Diagnosis not present

## 2015-09-26 DIAGNOSIS — Z510 Encounter for antineoplastic radiation therapy: Secondary | ICD-10-CM | POA: Diagnosis not present

## 2015-09-26 DIAGNOSIS — I129 Hypertensive chronic kidney disease with stage 1 through stage 4 chronic kidney disease, or unspecified chronic kidney disease: Secondary | ICD-10-CM | POA: Diagnosis not present

## 2015-09-27 DIAGNOSIS — Z510 Encounter for antineoplastic radiation therapy: Secondary | ICD-10-CM | POA: Diagnosis not present

## 2015-09-27 DIAGNOSIS — C61 Malignant neoplasm of prostate: Secondary | ICD-10-CM | POA: Diagnosis not present

## 2015-10-15 DIAGNOSIS — C61 Malignant neoplasm of prostate: Secondary | ICD-10-CM | POA: Diagnosis not present

## 2015-10-22 DIAGNOSIS — C61 Malignant neoplasm of prostate: Secondary | ICD-10-CM | POA: Diagnosis not present

## 2015-10-24 DIAGNOSIS — Z923 Personal history of irradiation: Secondary | ICD-10-CM | POA: Diagnosis not present

## 2015-10-24 DIAGNOSIS — C61 Malignant neoplasm of prostate: Secondary | ICD-10-CM | POA: Diagnosis not present

## 2015-11-21 ENCOUNTER — Other Ambulatory Visit: Payer: Self-pay | Admitting: Cardiovascular Disease

## 2015-11-26 DIAGNOSIS — E6609 Other obesity due to excess calories: Secondary | ICD-10-CM | POA: Diagnosis not present

## 2015-11-26 DIAGNOSIS — E1165 Type 2 diabetes mellitus with hyperglycemia: Secondary | ICD-10-CM | POA: Diagnosis not present

## 2015-11-26 DIAGNOSIS — E782 Mixed hyperlipidemia: Secondary | ICD-10-CM | POA: Diagnosis not present

## 2015-11-26 DIAGNOSIS — Z1389 Encounter for screening for other disorder: Secondary | ICD-10-CM | POA: Diagnosis not present

## 2015-11-26 DIAGNOSIS — Z6833 Body mass index (BMI) 33.0-33.9, adult: Secondary | ICD-10-CM | POA: Diagnosis not present

## 2015-11-26 DIAGNOSIS — I2510 Atherosclerotic heart disease of native coronary artery without angina pectoris: Secondary | ICD-10-CM | POA: Diagnosis not present

## 2015-11-26 DIAGNOSIS — L84 Corns and callosities: Secondary | ICD-10-CM | POA: Diagnosis not present

## 2015-11-26 DIAGNOSIS — G4733 Obstructive sleep apnea (adult) (pediatric): Secondary | ICD-10-CM | POA: Diagnosis not present

## 2015-11-26 DIAGNOSIS — R201 Hypoesthesia of skin: Secondary | ICD-10-CM | POA: Diagnosis not present

## 2015-11-26 DIAGNOSIS — I10 Essential (primary) hypertension: Secondary | ICD-10-CM | POA: Diagnosis not present

## 2015-11-27 ENCOUNTER — Encounter: Payer: Self-pay | Admitting: Cardiovascular Disease

## 2015-11-27 ENCOUNTER — Ambulatory Visit (INDEPENDENT_AMBULATORY_CARE_PROVIDER_SITE_OTHER): Payer: Medicare Other | Admitting: Cardiovascular Disease

## 2015-11-27 VITALS — BP 120/68 | HR 82 | Ht 69.0 in | Wt 244.0 lb

## 2015-11-27 DIAGNOSIS — I350 Nonrheumatic aortic (valve) stenosis: Secondary | ICD-10-CM

## 2015-11-27 DIAGNOSIS — I5032 Chronic diastolic (congestive) heart failure: Secondary | ICD-10-CM | POA: Diagnosis not present

## 2015-11-27 DIAGNOSIS — I10 Essential (primary) hypertension: Secondary | ICD-10-CM

## 2015-11-27 DIAGNOSIS — I2510 Atherosclerotic heart disease of native coronary artery without angina pectoris: Secondary | ICD-10-CM

## 2015-11-27 DIAGNOSIS — E7800 Pure hypercholesterolemia, unspecified: Secondary | ICD-10-CM | POA: Diagnosis not present
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
## 2015-11-27 NOTE — Patient Instructions (Signed)
Your physician wants you to follow-up in: 1 YEAR Dr Virgina Jock will receive a reminder letter in the mail two months in advance. If you don't receive a letter, please call our office to schedule the follow-up appointment.    Your physician recommends that you continue on your current medications as directed. Please refer to the Current Medication list given to you today.    If you need a refill on your cardiac medications before your next appointment, please call your pharmacy.    Thank you for choosing [facility] !

## 2015-11-28 IMAGING — CT CT CHEST W/O CM
2 of 3 series · 15 of 36 positions shown, 18 images · non-contrast
Comparison: 08/09/2013

CLINICAL DATA: Solitary pulmonary nodule seen 08/09/2013.

EXAM:
CT CHEST WITHOUT CONTRAST
TECHNIQUE: Multidetector CT imaging of the chest was performed following the
standard protocol without IV contrast.

[Series 2: chestroutine 5.0 b40f · axial · 0.80mm/px · z∈[-278,-4]mm · 12 of 65 slices shown, 15 images]
[im 5/65  mediastinal]
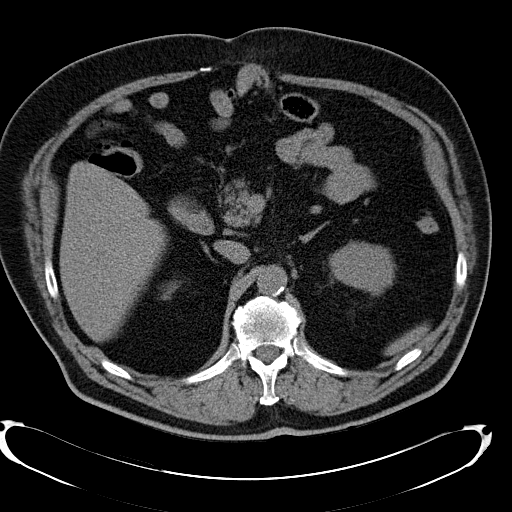
[im 5/65  lung]
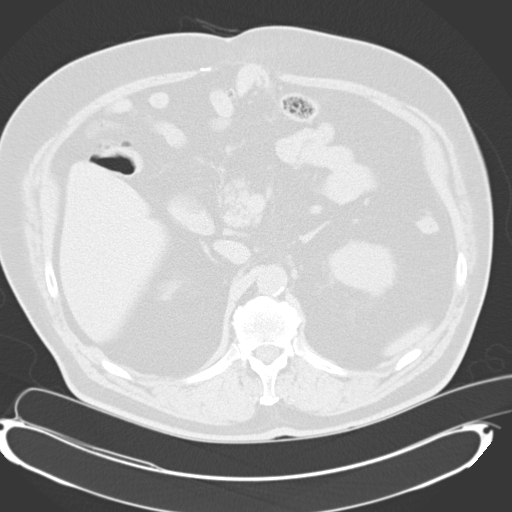
[im 10/65  lung]
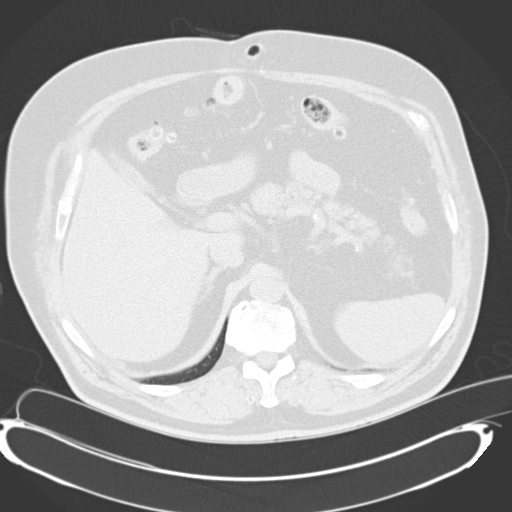
[im 15/65  lung]
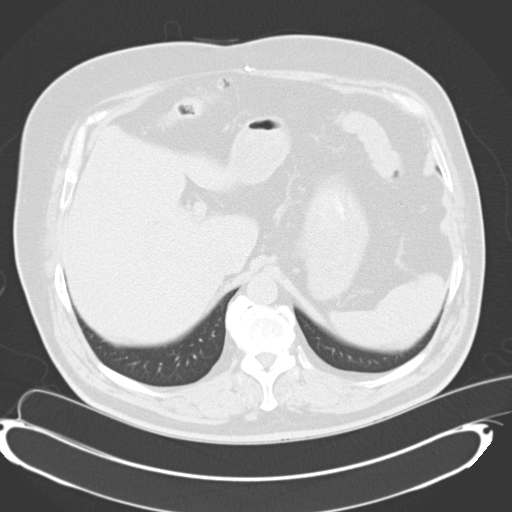
[im 19/65  lung]
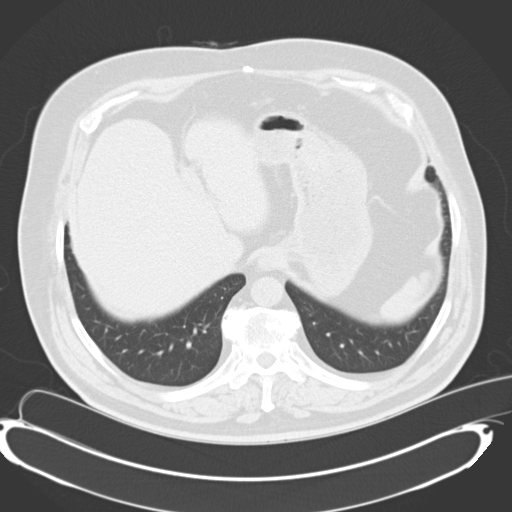
[im 24/65  mediastinal]
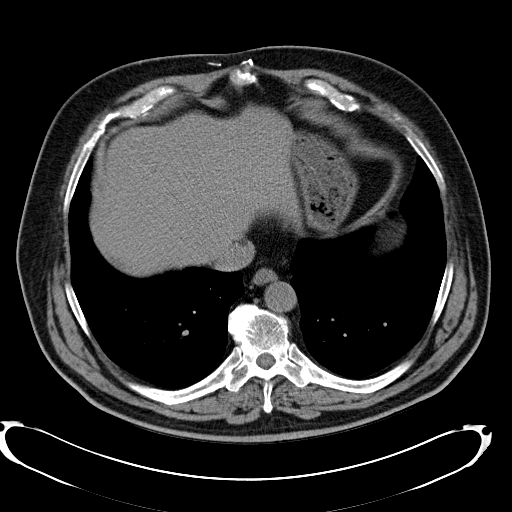
[im 24/65  lung]
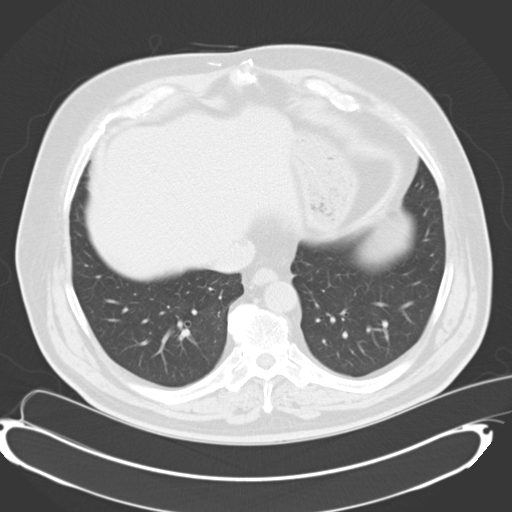
[im 29/65  lung]
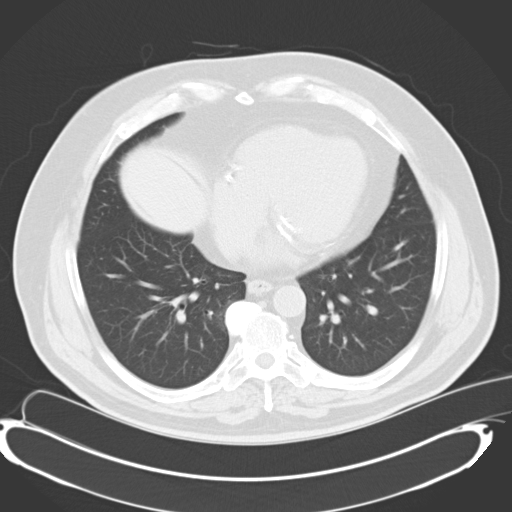
[im 36/65  lung]
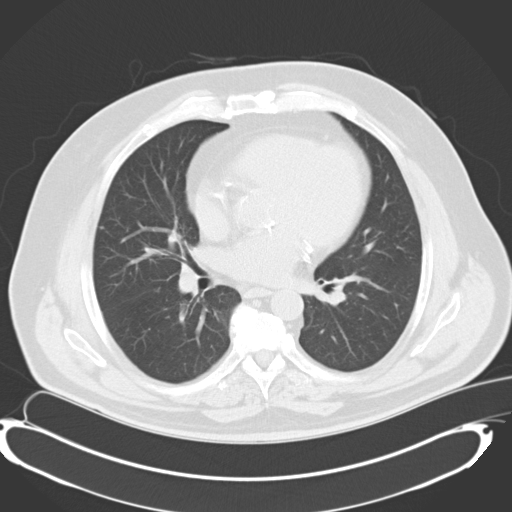
[im 41/65  lung]
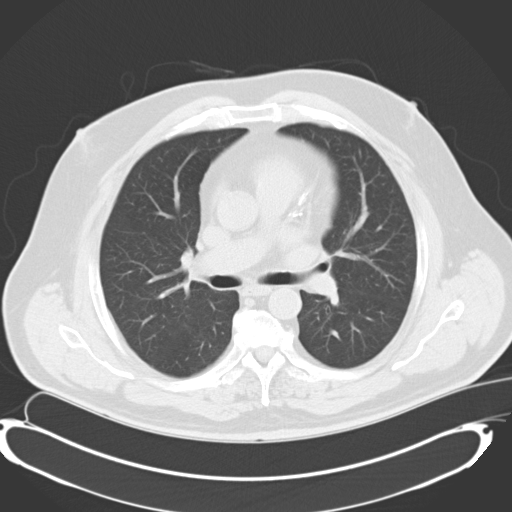
[im 46/65  mediastinal]
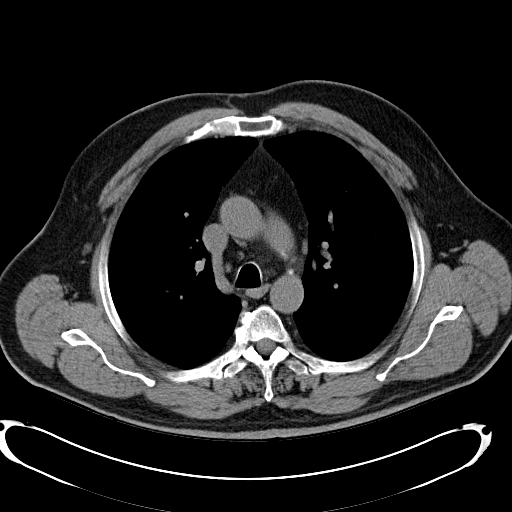
[im 46/65  lung]
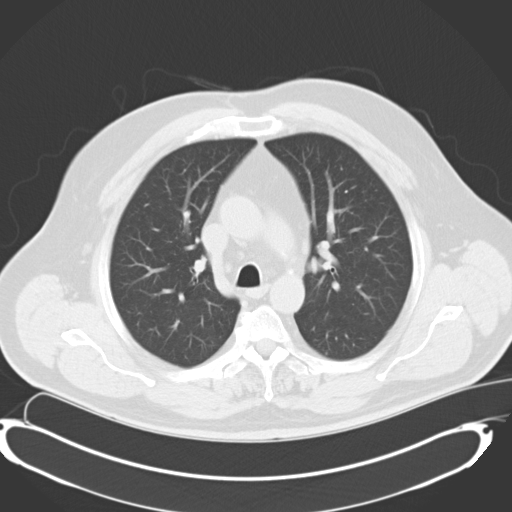
[im 50/65  lung]
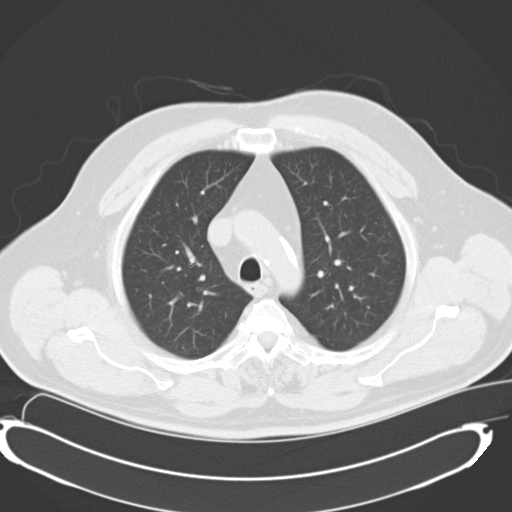
[im 55/65  lung]
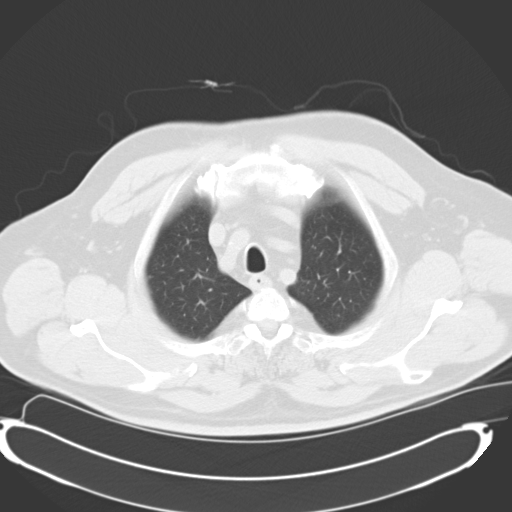
[im 60/65  lung]
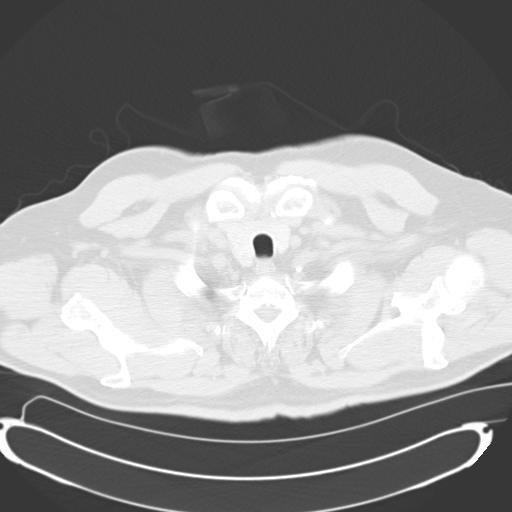

[Series 4: mpr coro 3mm · coronal · 0.65mm/px · 3 of 94 slices shown]
[im 19/94  lung]
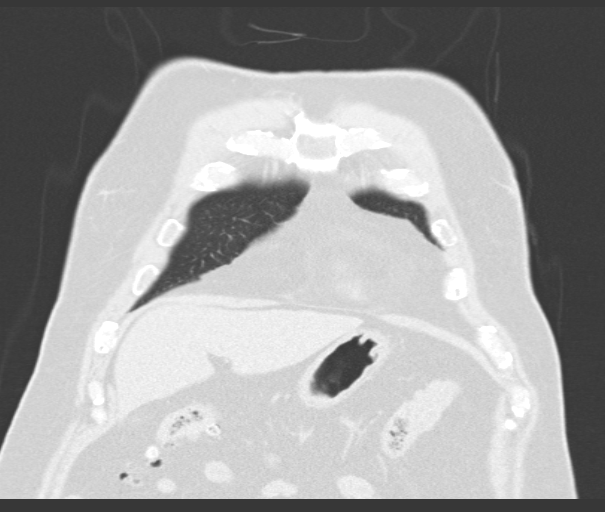
[im 38/94  lung]
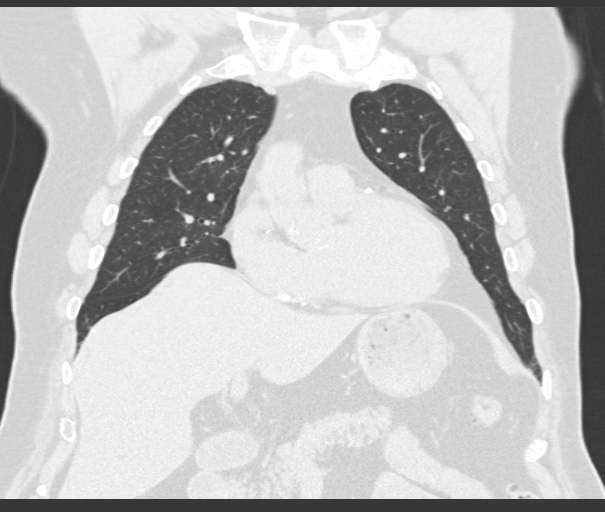
[im 56/94  lung]
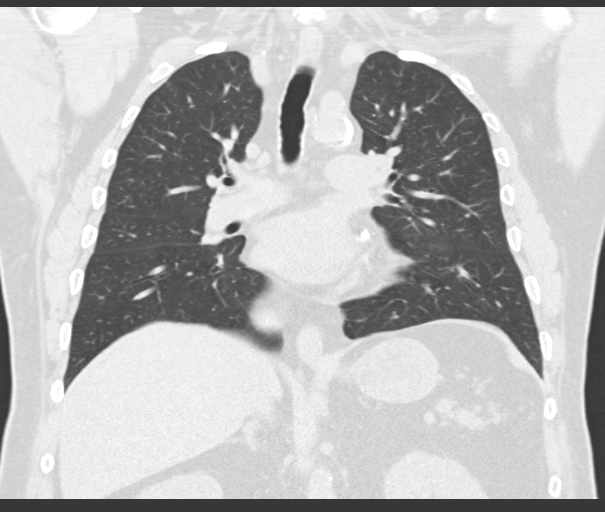

[15 of 36 positions shown; findings below may reference images not displayed]

FINDINGS: Mediastinum/Nodes: No mediastinal hematoma. Normal heart size.
Normal caliber thoracic aorta. Thoracic aortic atherosclerosis. No
mediastinal hematoma. Normal esophagus. Coronary artery
atherosclerosis in the left main, lad, first diagonal branch,
circumflex and RCA.

Lungs/Pleura: No focal consolidation, pleural effusion or
pneumothorax. Stable 4 mm subpleural pulmonary nodule in the right
middle lobe (image 30/ series 3). Calcified nodule along the upper
right major fissure likely reflecting sequela prior granulomatous
disease. No other pulmonary nodule or pulmonary mass.

Upper abdomen: Partially visualized is prior midline ventral
abdominal hernia repair with 3 current left paramedian hernia
containing a loop of nondilated transverse colon.

Musculoskeletal: No lytic or sclerotic osseous lesion. No acute
osseous abnormality. Anterior flowing osteophytes of the thoracic
spine as can be seen with diffuse idiopathic skeletal hyperostosis.
Chronic T11 anterior vertebral body compression fracture.
IMPRESSION: 1. Stable 4 mm subpleural right middle lobe pulmonary nodule. If the
patient is at high risk for bronchogenic carcinoma, follow-up chest
CT at 1 year is recommended. If the patient is at low risk, no
follow-up is needed. This recommendation follows the consensus
statement: Guidelines for Management of Small Pulmonary Nodules
Detected on CT Scans: A Statement from the [HOSPITAL] as

## 2015-12-30 ENCOUNTER — Other Ambulatory Visit: Payer: Self-pay | Admitting: Cardiovascular Disease

## 2016-01-22 DIAGNOSIS — E1129 Type 2 diabetes mellitus with other diabetic kidney complication: Secondary | ICD-10-CM | POA: Diagnosis not present

## 2016-01-22 DIAGNOSIS — H524 Presbyopia: Secondary | ICD-10-CM | POA: Diagnosis not present

## 2016-01-22 DIAGNOSIS — Z961 Presence of intraocular lens: Secondary | ICD-10-CM | POA: Diagnosis not present

## 2016-01-22 DIAGNOSIS — H5203 Hypermetropia, bilateral: Secondary | ICD-10-CM | POA: Diagnosis not present

## 2016-01-22 DIAGNOSIS — Z794 Long term (current) use of insulin: Secondary | ICD-10-CM | POA: Diagnosis not present

## 2016-01-22 DIAGNOSIS — E119 Type 2 diabetes mellitus without complications: Secondary | ICD-10-CM | POA: Diagnosis not present

## 2016-01-28 ENCOUNTER — Other Ambulatory Visit: Payer: Self-pay | Admitting: Cardiovascular Disease

## 2016-02-27 DIAGNOSIS — E1122 Type 2 diabetes mellitus with diabetic chronic kidney disease: Secondary | ICD-10-CM | POA: Diagnosis not present

## 2016-02-27 DIAGNOSIS — E559 Vitamin D deficiency, unspecified: Secondary | ICD-10-CM | POA: Diagnosis not present

## 2016-02-27 DIAGNOSIS — I129 Hypertensive chronic kidney disease with stage 1 through stage 4 chronic kidney disease, or unspecified chronic kidney disease: Secondary | ICD-10-CM | POA: Diagnosis not present

## 2016-02-27 DIAGNOSIS — N183 Chronic kidney disease, stage 3 (moderate): Secondary | ICD-10-CM | POA: Diagnosis not present

## 2016-02-28 DIAGNOSIS — L84 Corns and callosities: Secondary | ICD-10-CM | POA: Diagnosis not present

## 2016-02-28 DIAGNOSIS — Z6833 Body mass index (BMI) 33.0-33.9, adult: Secondary | ICD-10-CM | POA: Diagnosis not present

## 2016-02-28 DIAGNOSIS — G894 Chronic pain syndrome: Secondary | ICD-10-CM | POA: Diagnosis not present

## 2016-02-28 DIAGNOSIS — E559 Vitamin D deficiency, unspecified: Secondary | ICD-10-CM | POA: Diagnosis not present

## 2016-02-28 DIAGNOSIS — Z125 Encounter for screening for malignant neoplasm of prostate: Secondary | ICD-10-CM | POA: Diagnosis not present

## 2016-02-28 DIAGNOSIS — N183 Chronic kidney disease, stage 3 (moderate): Secondary | ICD-10-CM | POA: Diagnosis not present

## 2016-02-28 DIAGNOSIS — I129 Hypertensive chronic kidney disease with stage 1 through stage 4 chronic kidney disease, or unspecified chronic kidney disease: Secondary | ICD-10-CM | POA: Diagnosis not present

## 2016-02-28 DIAGNOSIS — E782 Mixed hyperlipidemia: Secondary | ICD-10-CM | POA: Diagnosis not present

## 2016-02-28 DIAGNOSIS — E785 Hyperlipidemia, unspecified: Secondary | ICD-10-CM | POA: Diagnosis not present

## 2016-02-28 DIAGNOSIS — E1122 Type 2 diabetes mellitus with diabetic chronic kidney disease: Secondary | ICD-10-CM | POA: Diagnosis not present

## 2016-02-28 DIAGNOSIS — E119 Type 2 diabetes mellitus without complications: Secondary | ICD-10-CM | POA: Diagnosis not present

## 2016-02-28 DIAGNOSIS — Z1389 Encounter for screening for other disorder: Secondary | ICD-10-CM | POA: Diagnosis not present

## 2016-02-28 DIAGNOSIS — I10 Essential (primary) hypertension: Secondary | ICD-10-CM | POA: Diagnosis not present

## 2016-02-28 DIAGNOSIS — R201 Hypoesthesia of skin: Secondary | ICD-10-CM | POA: Diagnosis not present

## 2016-02-28 DIAGNOSIS — E1129 Type 2 diabetes mellitus with other diabetic kidney complication: Secondary | ICD-10-CM | POA: Diagnosis not present

## 2016-05-21 ENCOUNTER — Ambulatory Visit (INDEPENDENT_AMBULATORY_CARE_PROVIDER_SITE_OTHER): Payer: Medicare Other | Admitting: Cardiovascular Disease

## 2016-05-21 ENCOUNTER — Encounter: Payer: Self-pay | Admitting: Cardiovascular Disease

## 2016-05-21 VITALS — BP 118/62 | HR 91 | Ht 69.5 in | Wt 243.0 lb

## 2016-05-21 DIAGNOSIS — I10 Essential (primary) hypertension: Secondary | ICD-10-CM

## 2016-05-21 DIAGNOSIS — R0602 Shortness of breath: Secondary | ICD-10-CM | POA: Diagnosis not present

## 2016-05-21 DIAGNOSIS — I5032 Chronic diastolic (congestive) heart failure: Secondary | ICD-10-CM

## 2016-05-21 DIAGNOSIS — C61 Malignant neoplasm of prostate: Secondary | ICD-10-CM | POA: Diagnosis not present

## 2016-05-21 DIAGNOSIS — I209 Angina pectoris, unspecified: Secondary | ICD-10-CM

## 2016-05-21 DIAGNOSIS — I350 Nonrheumatic aortic (valve) stenosis: Secondary | ICD-10-CM | POA: Diagnosis not present

## 2016-05-21 DIAGNOSIS — E7800 Pure hypercholesterolemia, unspecified: Secondary | ICD-10-CM | POA: Diagnosis not present

## 2016-05-21 DIAGNOSIS — I25118 Atherosclerotic heart disease of native coronary artery with other forms of angina pectoris: Secondary | ICD-10-CM

## 2016-05-21 LAB — CBC WITH DIFFERENTIAL/PLATELET
BASOS PCT: 0 %
Basophils Absolute: 0 cells/uL (ref 0–200)
Eosinophils Absolute: 216 cells/uL (ref 15–500)
Eosinophils Relative: 2 %
HCT: 40 % (ref 38.5–50.0)
Hemoglobin: 13.4 g/dL (ref 13.2–17.1)
LYMPHS ABS: 1944 {cells}/uL (ref 850–3900)
LYMPHS PCT: 18 %
MCH: 31 pg (ref 27.0–33.0)
MCHC: 33.5 g/dL (ref 32.0–36.0)
MCV: 92.6 fL (ref 80.0–100.0)
MPV: 9.1 fL (ref 7.5–12.5)
Monocytes Absolute: 756 cells/uL (ref 200–950)
Monocytes Relative: 7 %
NEUTROS ABS: 7884 {cells}/uL — AB (ref 1500–7800)
Neutrophils Relative %: 73 %
Platelets: 234 10*3/uL (ref 140–400)
RBC: 4.32 MIL/uL (ref 4.20–5.80)
RDW: 13.7 % (ref 11.0–15.0)
WBC: 10.8 10*3/uL (ref 3.8–10.8)
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
## 2016-05-21 NOTE — Patient Instructions (Addendum)
Medication Instructions:  STOP PLAVIX   Labwork: Your physician recommends that you return for lab work in: TODAY PSA CBC   Testing/Procedures: Your physician has requested that you have an exercise tolerance test. For further information please visit [email or web address]. Please also follow instruction sheet, as given.    Follow-Up: Your physician recommends that you schedule a follow-up appointment in: 3 MONTHS    Any Other Special Instructions Will Be Listed Below (If Applicable).     If you need a refill on your cardiac medications before your next appointment, please call your pharmacy.

## 2016-05-22 ENCOUNTER — Telehealth: Payer: Self-pay

## 2016-05-22 LAB — PSA: PSA: 0.6 ng/mL (ref <=4.0)

## 2016-05-22 NOTE — Telephone Encounter (Signed)
Called pt, left message for pt to return call.  

## 2016-05-22 NOTE — Telephone Encounter (Signed)
-----   Message from Herminio Commons, MD sent at 05/22/2016 10:10 AM EDT ----- PSA and Hgb normal.

## 2016-05-23 ENCOUNTER — Other Ambulatory Visit: Payer: Self-pay | Admitting: Cardiovascular Disease

## 2016-05-29 ENCOUNTER — Ambulatory Visit (HOSPITAL_COMMUNITY)
Admission: RE | Admit: 2016-05-29 | Discharge: 2016-05-29 | Disposition: A | Discharge status: Home / Self Care | Payer: Medicare Other | Source: Ambulatory Visit | Attending: Cardiovascular Disease | Admitting: Cardiovascular Disease

## 2016-05-29 DIAGNOSIS — I25118 Atherosclerotic heart disease of native coronary artery with other forms of angina pectoris: Secondary | ICD-10-CM | POA: Insufficient documentation

## 2016-05-29 LAB — EXERCISE TOLERANCE TEST
CSEPED: 6 min
CSEPEW: 8.1 METS
CSEPHR: 75 %
Exercise duration (sec): 23 s
MPHR: 151 {beats}/min
Peak HR: 114 {beats}/min
RPE: 16
Rest HR: 66 {beats}/min

## 2016-06-03 ENCOUNTER — Telehealth: Payer: Self-pay | Admitting: Adult Health

## 2016-06-03 DIAGNOSIS — Z1389 Encounter for screening for other disorder: Secondary | ICD-10-CM | POA: Diagnosis not present

## 2016-06-03 DIAGNOSIS — K219 Gastro-esophageal reflux disease without esophagitis: Secondary | ICD-10-CM | POA: Diagnosis not present

## 2016-06-03 DIAGNOSIS — R201 Hypoesthesia of skin: Secondary | ICD-10-CM | POA: Diagnosis not present

## 2016-06-03 DIAGNOSIS — E1165 Type 2 diabetes mellitus with hyperglycemia: Secondary | ICD-10-CM | POA: Diagnosis not present

## 2016-06-03 DIAGNOSIS — I10 Essential (primary) hypertension: Secondary | ICD-10-CM | POA: Diagnosis not present

## 2016-06-03 DIAGNOSIS — E6609 Other obesity due to excess calories: Secondary | ICD-10-CM | POA: Diagnosis not present

## 2016-06-03 DIAGNOSIS — Z6833 Body mass index (BMI) 33.0-33.9, adult: Secondary | ICD-10-CM | POA: Diagnosis not present

## 2016-06-03 NOTE — Telephone Encounter (Signed)
Pt given results of nuclear stress test-see results and MD instructions, copied pcp

## 2016-06-03 NOTE — Telephone Encounter (Signed)
Returning call for test results / tg

## 2016-06-05 DIAGNOSIS — N183 Chronic kidney disease, stage 3 (moderate): Secondary | ICD-10-CM | POA: Diagnosis not present

## 2016-06-05 DIAGNOSIS — I10 Essential (primary) hypertension: Secondary | ICD-10-CM | POA: Diagnosis not present

## 2016-06-05 DIAGNOSIS — E1122 Type 2 diabetes mellitus with diabetic chronic kidney disease: Secondary | ICD-10-CM | POA: Diagnosis not present

## 2016-06-05 DIAGNOSIS — E559 Vitamin D deficiency, unspecified: Secondary | ICD-10-CM | POA: Diagnosis not present

## 2016-06-05 DIAGNOSIS — E785 Hyperlipidemia, unspecified: Secondary | ICD-10-CM | POA: Diagnosis not present

## 2016-06-05 DIAGNOSIS — I129 Hypertensive chronic kidney disease with stage 1 through stage 4 chronic kidney disease, or unspecified chronic kidney disease: Secondary | ICD-10-CM | POA: Diagnosis not present

## 2016-06-08 ENCOUNTER — Emergency Department (HOSPITAL_COMMUNITY): Payer: Medicare Other

## 2016-06-08 ENCOUNTER — Emergency Department (HOSPITAL_COMMUNITY)
Admission: EM | Admit: 2016-06-08 | Discharge: 2016-06-08 | Disposition: A | Discharge status: Home / Self Care | Payer: Medicare Other | Attending: Emergency Medicine | Admitting: Emergency Medicine

## 2016-06-08 DIAGNOSIS — Z7982 Long term (current) use of aspirin: Secondary | ICD-10-CM | POA: Diagnosis not present

## 2016-06-08 DIAGNOSIS — R404 Transient alteration of awareness: Secondary | ICD-10-CM | POA: Diagnosis not present

## 2016-06-08 DIAGNOSIS — Z7984 Long term (current) use of oral hypoglycemic drugs: Secondary | ICD-10-CM | POA: Insufficient documentation

## 2016-06-08 DIAGNOSIS — Z87891 Personal history of nicotine dependence: Secondary | ICD-10-CM | POA: Insufficient documentation

## 2016-06-08 DIAGNOSIS — E1122 Type 2 diabetes mellitus with diabetic chronic kidney disease: Secondary | ICD-10-CM | POA: Diagnosis not present

## 2016-06-08 DIAGNOSIS — R197 Diarrhea, unspecified: Secondary | ICD-10-CM | POA: Diagnosis not present

## 2016-06-08 DIAGNOSIS — I2510 Atherosclerotic heart disease of native coronary artery without angina pectoris: Secondary | ICD-10-CM | POA: Insufficient documentation

## 2016-06-08 DIAGNOSIS — R531 Weakness: Secondary | ICD-10-CM | POA: Diagnosis not present

## 2016-06-08 DIAGNOSIS — Z7902 Long term (current) use of antithrombotics/antiplatelets: Secondary | ICD-10-CM | POA: Diagnosis not present

## 2016-06-08 DIAGNOSIS — Z79899 Other long term (current) drug therapy: Secondary | ICD-10-CM | POA: Diagnosis not present

## 2016-06-08 DIAGNOSIS — R112 Nausea with vomiting, unspecified: Secondary | ICD-10-CM | POA: Insufficient documentation

## 2016-06-08 DIAGNOSIS — N183 Chronic kidney disease, stage 3 (moderate): Secondary | ICD-10-CM | POA: Insufficient documentation

## 2016-06-08 DIAGNOSIS — I5032 Chronic diastolic (congestive) heart failure: Secondary | ICD-10-CM | POA: Diagnosis not present

## 2016-06-08 DIAGNOSIS — R109 Unspecified abdominal pain: Secondary | ICD-10-CM | POA: Diagnosis not present

## 2016-06-08 DIAGNOSIS — I130 Hypertensive heart and chronic kidney disease with heart failure and stage 1 through stage 4 chronic kidney disease, or unspecified chronic kidney disease: Secondary | ICD-10-CM | POA: Insufficient documentation

## 2016-06-08 LAB — COMPREHENSIVE METABOLIC PANEL
ALK PHOS: 42 U/L (ref 38–126)
ALT: 52 U/L (ref 17–63)
ANION GAP: 9 (ref 5–15)
AST: 49 U/L — ABNORMAL HIGH (ref 15–41)
Albumin: 4.5 g/dL (ref 3.5–5.0)
BUN: 31 mg/dL — ABNORMAL HIGH (ref 6–20)
CALCIUM: 9.3 mg/dL (ref 8.9–10.3)
CHLORIDE: 108 mmol/L (ref 101–111)
CO2: 23 mmol/L (ref 22–32)
Creatinine, Ser: 2.12 mg/dL — ABNORMAL HIGH (ref 0.61–1.24)
GFR calc non Af Amer: 30 mL/min — ABNORMAL LOW (ref >60)
GFR, EST AFRICAN AMERICAN: 35 mL/min — AB (ref >60)
GLUCOSE: 197 mg/dL — AB (ref 65–99)
Potassium: 5.3 mmol/L — ABNORMAL HIGH (ref 3.5–5.1)
SODIUM: 140 mmol/L (ref 135–145)
Total Bilirubin: 1 mg/dL (ref 0.3–1.2)
Total Protein: 8.3 g/dL — ABNORMAL HIGH (ref 6.5–8.1)

## 2016-06-08 LAB — CBC WITH DIFFERENTIAL/PLATELET
Basophils Absolute: 0 10*3/uL (ref 0.0–0.1)
Basophils Relative: 0 %
EOS ABS: 0.3 10*3/uL (ref 0.0–0.7)
Eosinophils Relative: 2 %
HCT: 44.9 % (ref 39.0–52.0)
HEMOGLOBIN: 15.1 g/dL (ref 13.0–17.0)
Lymphocytes Relative: 5 %
Lymphs Abs: 0.8 10*3/uL (ref 0.7–4.0)
MCH: 31.4 pg (ref 26.0–34.0)
MCHC: 33.6 g/dL (ref 30.0–36.0)
MCV: 93.3 fL (ref 78.0–100.0)
MONOS PCT: 9 %
Monocytes Absolute: 1.6 10*3/uL — ABNORMAL HIGH (ref 0.1–1.0)
NEUTROS PCT: 84 %
Neutro Abs: 15.2 10*3/uL — ABNORMAL HIGH (ref 1.7–7.7)
PLATELETS: 268 10*3/uL (ref 150–400)
RBC: 4.81 MIL/uL (ref 4.22–5.81)
RDW: 13.3 % (ref 11.5–15.5)
WBC: 17.9 10*3/uL — AB (ref 4.0–10.5)

## 2016-06-08 LAB — LIPASE, BLOOD: Lipase: 63 U/L — ABNORMAL HIGH (ref 11–51)

## 2016-06-08 MED ORDER — ONDANSETRON 4 MG PO TBDP: 4.0000 mg | ORAL_TABLET | Freq: Three times a day (TID) | ORAL | 0 refills | Status: DC | PRN

## 2016-06-08 MED ORDER — IOPAMIDOL (ISOVUE-300) INJECTION 61%
INTRAVENOUS | Status: AC
  Filled 2016-06-08: qty 30

## 2016-06-08 MED ORDER — ONDANSETRON HCL 4 MG/2ML IJ SOLN
4.0000 mg | Freq: Once | INTRAMUSCULAR | Status: AC
  Administered 2016-06-08: 4 mg via INTRAVENOUS
  Filled 2016-06-08: qty 2

## 2016-06-08 MED ORDER — SODIUM CHLORIDE 0.9 % IV BOLUS (SEPSIS)
1000.0000 mL | Freq: Once | INTRAVENOUS | Status: AC
  Administered 2016-06-08: 1000 mL via INTRAVENOUS

## 2016-06-08 MED ORDER — LOPERAMIDE HCL 2 MG PO CAPS
4.0000 mg | ORAL_CAPSULE | Freq: Once | ORAL | Status: AC
  Administered 2016-06-08: 4 mg via ORAL
  Filled 2016-06-08: qty 2

## 2016-06-08 MED ORDER — LOPERAMIDE HCL 2 MG PO CAPS: 2.0000 mg | ORAL_CAPSULE | Freq: Four times a day (QID) | ORAL | 0 refills | Status: DC | PRN

## 2016-06-08 NOTE — ED Notes (Signed)
Pt given water to drink for fluid challenge 

## 2016-06-08 NOTE — ED Notes (Signed)
Bedside commode placed in pt's room. Pt has had two watery BM's since his arrival.

## 2016-06-08 NOTE — Discharge Instructions (Signed)
You were seen today for nausea, vomiting, diarrhea. Your workup is largely reassuring. This may be a viral illness. It is very important that he stay hydrated. Take Zofran as needed for nausea and vomiting. Imodium as needed for diarrhea. Follow-up with primary doctor. If you have worsening abdominal pain, inability to tolerate fluids, or any new or worsening symptoms you should be reevaluated.
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
## 2016-06-08 NOTE — ED Triage Notes (Signed)
Pt brought in by rems for c/o n/v/d that started x 3 hours ago

## 2016-08-21 ENCOUNTER — Encounter: Payer: Self-pay | Admitting: Cardiovascular Disease

## 2016-08-21 ENCOUNTER — Ambulatory Visit (INDEPENDENT_AMBULATORY_CARE_PROVIDER_SITE_OTHER): Payer: Medicare Other | Admitting: Cardiovascular Disease

## 2016-08-21 ENCOUNTER — Encounter: Payer: Self-pay | Admitting: *Deleted

## 2016-08-21 ENCOUNTER — Other Ambulatory Visit: Payer: Self-pay | Admitting: Cardiovascular Disease

## 2016-08-21 VITALS — BP 142/82 | HR 72 | Ht 69.0 in | Wt 242.0 lb

## 2016-08-21 DIAGNOSIS — I350 Nonrheumatic aortic (valve) stenosis: Secondary | ICD-10-CM

## 2016-08-21 DIAGNOSIS — I10 Essential (primary) hypertension: Secondary | ICD-10-CM | POA: Diagnosis not present

## 2016-08-21 DIAGNOSIS — R0609 Other forms of dyspnea: Secondary | ICD-10-CM

## 2016-08-21 DIAGNOSIS — Z87891 Personal history of nicotine dependence: Secondary | ICD-10-CM

## 2016-08-21 DIAGNOSIS — I5032 Chronic diastolic (congestive) heart failure: Secondary | ICD-10-CM

## 2016-08-21 DIAGNOSIS — E7800 Pure hypercholesterolemia, unspecified: Secondary | ICD-10-CM | POA: Diagnosis not present

## 2016-08-21 DIAGNOSIS — C61 Malignant neoplasm of prostate: Secondary | ICD-10-CM | POA: Diagnosis not present

## 2016-08-21 DIAGNOSIS — I209 Angina pectoris, unspecified: Secondary | ICD-10-CM

## 2016-08-21 DIAGNOSIS — I25118 Atherosclerotic heart disease of native coronary artery with other forms of angina pectoris: Secondary | ICD-10-CM

## 2016-08-21 NOTE — Addendum Note (Signed)
Addended by: Levonne Hubert on: 08/21/2016 08:53 AM   Modules accepted: Orders
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
## 2016-08-21 NOTE — Patient Instructions (Signed)
Medication Instructions:  Your physician recommends that you continue on your current medications as directed. Please refer to the Current Medication list given to you today.   Labwork: NONE  Testing/Procedures: Your physician has requested that you have a lexiscan myoview. For further information please visit [email or web address]. Please follow instruction sheet, as given.    Follow-Up: Your physician recommends that you schedule a follow-up appointment in: 6 Weeks with Dr. Bronson Ing   Any Other Special Instructions Will Be Listed Below (If Applicable).  Thank you for choosing [facility]!     If you need a refill on your cardiac medications before your next appointment, please call your pharmacy.

## 2016-08-29 ENCOUNTER — Encounter (HOSPITAL_COMMUNITY)
Admission: RE | Admit: 2016-08-29 | Discharge: 2016-08-29 | Disposition: A | Discharge status: Home / Self Care | Payer: Medicare Other | Source: Ambulatory Visit | Attending: Cardiovascular Disease | Admitting: Cardiovascular Disease

## 2016-08-29 ENCOUNTER — Ambulatory Visit (HOSPITAL_COMMUNITY)
Admission: RE | Admit: 2016-08-29 | Discharge: 2016-08-29 | Disposition: A | Discharge status: Home / Self Care | Payer: Medicare Other | Source: Ambulatory Visit | Attending: Cardiovascular Disease | Admitting: Cardiovascular Disease

## 2016-08-29 ENCOUNTER — Encounter (HOSPITAL_COMMUNITY): Payer: Self-pay

## 2016-08-29 DIAGNOSIS — R0609 Other forms of dyspnea: Secondary | ICD-10-CM | POA: Diagnosis not present

## 2016-08-29 LAB — NM MYOCAR MULTI W/SPECT W/WALL MOTION / EF
CHL CUP NUCLEAR SRS: 0
CHL CUP NUCLEAR SSS: 0
CHL CUP RESTING HR STRESS: 57 {beats}/min
LHR: 0.3
LV sys vol: 37 mL
LVDIAVOL: 90 mL (ref 62–150)
NUC STRESS TID: 1.09
Peak HR: 74 {beats}/min
SDS: 0

## 2016-08-29 MED ORDER — SODIUM CHLORIDE 0.9% FLUSH
INTRAVENOUS | Status: AC
  Administered 2016-08-29: 10 mL via INTRAVENOUS
  Filled 2016-08-29: qty 10

## 2016-08-29 MED ORDER — TECHNETIUM TC 99M TETROFOSMIN IV KIT
30.0000 | PACK | Freq: Once | INTRAVENOUS | Status: AC | PRN
Start: 2016-08-29 — End: 2016-08-29
  Administered 2016-08-29: 30 via INTRAVENOUS

## 2016-08-29 MED ORDER — TECHNETIUM TC 99M TETROFOSMIN IV KIT
10.0000 | PACK | Freq: Once | INTRAVENOUS | Status: AC | PRN
  Administered 2016-08-29: 10 via INTRAVENOUS

## 2016-08-29 MED ORDER — REGADENOSON 0.4 MG/5ML IV SOLN
INTRAVENOUS | Status: AC
  Administered 2016-08-29: 0.4 mg via INTRAVENOUS
  Filled 2016-08-29: qty 5

## 2016-09-02 ENCOUNTER — Telehealth (INDEPENDENT_AMBULATORY_CARE_PROVIDER_SITE_OTHER): Payer: Medicare Other | Admitting: *Deleted

## 2016-09-02 DIAGNOSIS — R0602 Shortness of breath: Secondary | ICD-10-CM | POA: Diagnosis not present

## 2016-09-02 NOTE — Telephone Encounter (Signed)
-----   Message from Herminio Commons, MD sent at 08/29/2016 12:03 PM EDT ----- Normal. Obtain spirometry with pre and post bronchodilator challenge for h/o tobacco abuse and complaints of shortness of breath.

## 2016-09-24 ENCOUNTER — Other Ambulatory Visit: Payer: Self-pay | Admitting: *Deleted

## 2016-09-24 DIAGNOSIS — R0602 Shortness of breath: Secondary | ICD-10-CM

## 2016-09-26 DIAGNOSIS — R201 Hypoesthesia of skin: Secondary | ICD-10-CM | POA: Diagnosis not present

## 2016-09-26 DIAGNOSIS — M1991 Primary osteoarthritis, unspecified site: Secondary | ICD-10-CM | POA: Diagnosis not present

## 2016-09-26 DIAGNOSIS — Z23 Encounter for immunization: Secondary | ICD-10-CM | POA: Diagnosis not present

## 2016-09-26 DIAGNOSIS — E119 Type 2 diabetes mellitus without complications: Secondary | ICD-10-CM | POA: Diagnosis not present

## 2016-09-26 DIAGNOSIS — Z6833 Body mass index (BMI) 33.0-33.9, adult: Secondary | ICD-10-CM | POA: Diagnosis not present

## 2016-09-26 DIAGNOSIS — S90811A Abrasion, right foot, initial encounter: Secondary | ICD-10-CM | POA: Diagnosis not present

## 2016-10-02 ENCOUNTER — Ambulatory Visit (INDEPENDENT_AMBULATORY_CARE_PROVIDER_SITE_OTHER): Payer: Medicare Other | Admitting: Cardiovascular Disease

## 2016-10-02 ENCOUNTER — Encounter: Payer: Self-pay | Admitting: Cardiovascular Disease

## 2016-10-02 VITALS — BP 146/66 | HR 73 | Ht 69.0 in | Wt 242.0 lb

## 2016-10-02 DIAGNOSIS — E7800 Pure hypercholesterolemia, unspecified: Secondary | ICD-10-CM

## 2016-10-02 DIAGNOSIS — I5032 Chronic diastolic (congestive) heart failure: Secondary | ICD-10-CM

## 2016-10-02 DIAGNOSIS — I10 Essential (primary) hypertension: Secondary | ICD-10-CM

## 2016-10-02 DIAGNOSIS — I209 Angina pectoris, unspecified: Secondary | ICD-10-CM

## 2016-10-02 DIAGNOSIS — Z87891 Personal history of nicotine dependence: Secondary | ICD-10-CM | POA: Diagnosis not present

## 2016-10-02 DIAGNOSIS — I25118 Atherosclerotic heart disease of native coronary artery with other forms of angina pectoris: Secondary | ICD-10-CM

## 2016-10-02 DIAGNOSIS — I350 Nonrheumatic aortic (valve) stenosis: Secondary | ICD-10-CM | POA: Diagnosis not present

## 2016-10-02 DIAGNOSIS — R0609 Other forms of dyspnea: Secondary | ICD-10-CM | POA: Diagnosis not present

## 2016-10-02 MED ORDER — LISINOPRIL 20 MG PO TABS
20.0000 mg | ORAL_TABLET | Freq: Every day | ORAL | 3 refills | Status: DC
Start: 2016-10-02 — End: 2017-10-19
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
## 2016-10-02 NOTE — Patient Instructions (Signed)
Medication Instructions: INCREASE Lisinopril to 20 mg daily  Labwork: NONE  Procedures/Testing: NONE  Follow-Up: 1 year   Dr Bronson Ing       If you need a refill on your cardiac medications before your next appointment, please call your pharmacy.       Thank you for choosing [facility] !

## 2016-10-03 ENCOUNTER — Ambulatory Visit (HOSPITAL_COMMUNITY)
Admission: RE | Admit: 2016-10-03 | Discharge: 2016-10-03 | Disposition: A | Discharge status: Home / Self Care | Payer: Medicare Other | Source: Ambulatory Visit | Attending: Cardiovascular Disease | Admitting: Cardiovascular Disease

## 2016-10-03 DIAGNOSIS — R0602 Shortness of breath: Secondary | ICD-10-CM | POA: Insufficient documentation

## 2016-10-08 LAB — SPIROMETRY WITH GRAPH
FEF 25-75 Pre: 3.22 L/sec
FEF2575-%PRED-PRE: 135 %
FEV1-%Pred-Pre: 88 %
FEV1-Pre: 2.77 L
FEV1FVC-%PRED-PRE: 111 %
FEV6-%Pred-Pre: 83 %
FEV6-Pre: 3.35 L
FEV6FVC-%PRED-PRE: 105 %
FVC-%Pred-Pre: 79 %
FVC-PRE: 3.37 L
PRE FEV1/FVC RATIO: 82 %
PRE FEV6/FVC RATIO: 100 %

## 2016-11-17 DIAGNOSIS — Z6834 Body mass index (BMI) 34.0-34.9, adult: Secondary | ICD-10-CM | POA: Diagnosis not present

## 2016-11-17 DIAGNOSIS — E6609 Other obesity due to excess calories: Secondary | ICD-10-CM | POA: Diagnosis not present

## 2016-11-17 DIAGNOSIS — Z1389 Encounter for screening for other disorder: Secondary | ICD-10-CM | POA: Diagnosis not present

## 2016-11-17 DIAGNOSIS — E782 Mixed hyperlipidemia: Secondary | ICD-10-CM | POA: Diagnosis not present

## 2016-11-17 DIAGNOSIS — Z0000 Encounter for general adult medical examination without abnormal findings: Secondary | ICD-10-CM | POA: Diagnosis not present

## 2016-11-18 ENCOUNTER — Telehealth: Payer: Self-pay | Admitting: Cardiovascular Disease

## 2016-11-18 MED ORDER — AMLODIPINE BESYLATE 5 MG PO TABS: 5.0000 mg | ORAL_TABLET | Freq: Every day | ORAL | 3 refills | Status: DC

## 2016-11-18 NOTE — Telephone Encounter (Signed)
Add amlodipine 5 mg daily

## 2016-11-18 NOTE — Telephone Encounter (Signed)
Since last visit ,his lisinopril was increased to 20 mg.he continues to have elevate BP's 152/72, 160/76  He is calling for advice.

## 2016-11-18 NOTE — Telephone Encounter (Signed)
lvm stating he's had high BP--please give pt a call @ [phone]

## 2016-11-18 NOTE — Telephone Encounter (Signed)
Patient will start amlodipine 5 mg daily and call back with BP readings

## 2016-11-25 DIAGNOSIS — R809 Proteinuria, unspecified: Secondary | ICD-10-CM | POA: Diagnosis not present

## 2016-11-25 DIAGNOSIS — I129 Hypertensive chronic kidney disease with stage 1 through stage 4 chronic kidney disease, or unspecified chronic kidney disease: Secondary | ICD-10-CM | POA: Diagnosis not present

## 2016-11-25 DIAGNOSIS — E1122 Type 2 diabetes mellitus with diabetic chronic kidney disease: Secondary | ICD-10-CM | POA: Diagnosis not present

## 2016-11-25 DIAGNOSIS — N183 Chronic kidney disease, stage 3 (moderate): Secondary | ICD-10-CM | POA: Diagnosis not present

## 2016-12-23 DIAGNOSIS — Z794 Long term (current) use of insulin: Secondary | ICD-10-CM | POA: Diagnosis not present

## 2016-12-23 DIAGNOSIS — Z961 Presence of intraocular lens: Secondary | ICD-10-CM | POA: Diagnosis not present

## 2016-12-23 DIAGNOSIS — E1165 Type 2 diabetes mellitus with hyperglycemia: Secondary | ICD-10-CM | POA: Diagnosis not present

## 2016-12-23 DIAGNOSIS — E119 Type 2 diabetes mellitus without complications: Secondary | ICD-10-CM | POA: Diagnosis not present

## 2016-12-31 DIAGNOSIS — K219 Gastro-esophageal reflux disease without esophagitis: Secondary | ICD-10-CM | POA: Diagnosis not present

## 2016-12-31 DIAGNOSIS — Z6833 Body mass index (BMI) 33.0-33.9, adult: Secondary | ICD-10-CM | POA: Diagnosis not present

## 2016-12-31 DIAGNOSIS — M1991 Primary osteoarthritis, unspecified site: Secondary | ICD-10-CM | POA: Diagnosis not present

## 2016-12-31 DIAGNOSIS — M2550 Pain in unspecified joint: Secondary | ICD-10-CM | POA: Diagnosis not present

## 2016-12-31 DIAGNOSIS — E669 Obesity, unspecified: Secondary | ICD-10-CM | POA: Diagnosis not present

## 2016-12-31 DIAGNOSIS — Z1389 Encounter for screening for other disorder: Secondary | ICD-10-CM | POA: Diagnosis not present

## 2016-12-31 DIAGNOSIS — I10 Essential (primary) hypertension: Secondary | ICD-10-CM | POA: Diagnosis not present

## 2016-12-31 DIAGNOSIS — E1165 Type 2 diabetes mellitus with hyperglycemia: Secondary | ICD-10-CM | POA: Diagnosis not present

## 2016-12-31 DIAGNOSIS — G894 Chronic pain syndrome: Secondary | ICD-10-CM | POA: Diagnosis not present

## 2016-12-31 DIAGNOSIS — F419 Anxiety disorder, unspecified: Secondary | ICD-10-CM | POA: Diagnosis not present

## 2017-01-13 ENCOUNTER — Other Ambulatory Visit: Payer: Self-pay | Admitting: Cardiology

## 2017-04-01 DIAGNOSIS — L84 Corns and callosities: Secondary | ICD-10-CM | POA: Diagnosis not present

## 2017-04-01 DIAGNOSIS — B351 Tinea unguium: Secondary | ICD-10-CM | POA: Diagnosis not present

## 2017-04-01 DIAGNOSIS — E782 Mixed hyperlipidemia: Secondary | ICD-10-CM | POA: Diagnosis not present

## 2017-04-01 DIAGNOSIS — Z6832 Body mass index (BMI) 32.0-32.9, adult: Secondary | ICD-10-CM | POA: Diagnosis not present

## 2017-04-01 DIAGNOSIS — I10 Essential (primary) hypertension: Secondary | ICD-10-CM | POA: Diagnosis not present

## 2017-04-01 DIAGNOSIS — E6609 Other obesity due to excess calories: Secondary | ICD-10-CM | POA: Diagnosis not present

## 2017-04-01 DIAGNOSIS — R201 Hypoesthesia of skin: Secondary | ICD-10-CM | POA: Diagnosis not present

## 2017-04-01 DIAGNOSIS — Z1389 Encounter for screening for other disorder: Secondary | ICD-10-CM | POA: Diagnosis not present

## 2017-04-01 DIAGNOSIS — E1165 Type 2 diabetes mellitus with hyperglycemia: Secondary | ICD-10-CM | POA: Diagnosis not present

## 2017-04-01 DIAGNOSIS — E1129 Type 2 diabetes mellitus with other diabetic kidney complication: Secondary | ICD-10-CM | POA: Diagnosis not present

## 2017-05-26 DIAGNOSIS — I129 Hypertensive chronic kidney disease with stage 1 through stage 4 chronic kidney disease, or unspecified chronic kidney disease: Secondary | ICD-10-CM | POA: Diagnosis not present

## 2017-05-26 DIAGNOSIS — N183 Chronic kidney disease, stage 3 (moderate): Secondary | ICD-10-CM | POA: Diagnosis not present

## 2017-05-26 DIAGNOSIS — R809 Proteinuria, unspecified: Secondary | ICD-10-CM | POA: Diagnosis not present

## 2017-05-26 DIAGNOSIS — E1122 Type 2 diabetes mellitus with diabetic chronic kidney disease: Secondary | ICD-10-CM | POA: Diagnosis not present

## 2017-07-07 DIAGNOSIS — M7712 Lateral epicondylitis, left elbow: Secondary | ICD-10-CM | POA: Diagnosis not present

## 2017-07-07 DIAGNOSIS — E782 Mixed hyperlipidemia: Secondary | ICD-10-CM | POA: Diagnosis not present

## 2017-07-07 DIAGNOSIS — L219 Seborrheic dermatitis, unspecified: Secondary | ICD-10-CM | POA: Diagnosis not present

## 2017-07-07 DIAGNOSIS — Z6832 Body mass index (BMI) 32.0-32.9, adult: Secondary | ICD-10-CM | POA: Diagnosis not present

## 2017-07-07 DIAGNOSIS — I10 Essential (primary) hypertension: Secondary | ICD-10-CM | POA: Diagnosis not present

## 2017-07-07 DIAGNOSIS — E1142 Type 2 diabetes mellitus with diabetic polyneuropathy: Secondary | ICD-10-CM | POA: Diagnosis not present

## 2017-07-07 DIAGNOSIS — I5032 Chronic diastolic (congestive) heart failure: Secondary | ICD-10-CM | POA: Diagnosis not present

## 2017-10-08 DIAGNOSIS — G894 Chronic pain syndrome: Secondary | ICD-10-CM | POA: Diagnosis not present

## 2017-10-08 DIAGNOSIS — K219 Gastro-esophageal reflux disease without esophagitis: Secondary | ICD-10-CM | POA: Diagnosis not present

## 2017-10-08 DIAGNOSIS — F419 Anxiety disorder, unspecified: Secondary | ICD-10-CM | POA: Diagnosis not present

## 2017-10-08 DIAGNOSIS — L84 Corns and callosities: Secondary | ICD-10-CM | POA: Diagnosis not present

## 2017-10-08 DIAGNOSIS — I10 Essential (primary) hypertension: Secondary | ICD-10-CM | POA: Diagnosis not present

## 2017-10-08 DIAGNOSIS — Z1389 Encounter for screening for other disorder: Secondary | ICD-10-CM | POA: Diagnosis not present

## 2017-10-08 DIAGNOSIS — E1140 Type 2 diabetes mellitus with diabetic neuropathy, unspecified: Secondary | ICD-10-CM | POA: Diagnosis not present

## 2017-10-08 DIAGNOSIS — Z6832 Body mass index (BMI) 32.0-32.9, adult: Secondary | ICD-10-CM | POA: Diagnosis not present

## 2017-10-08 DIAGNOSIS — L219 Seborrheic dermatitis, unspecified: Secondary | ICD-10-CM | POA: Diagnosis not present

## 2017-10-13 ENCOUNTER — Encounter (HOSPITAL_COMMUNITY): Payer: Self-pay | Admitting: Emergency Medicine

## 2017-10-13 ENCOUNTER — Other Ambulatory Visit: Payer: Self-pay

## 2017-10-13 ENCOUNTER — Emergency Department (HOSPITAL_COMMUNITY)
Admission: EM | Admit: 2017-10-13 | Discharge: 2017-10-13 | Disposition: A | Discharge status: Home / Self Care | Payer: Medicare Other | Attending: Emergency Medicine | Admitting: Emergency Medicine

## 2017-10-13 DIAGNOSIS — Z7982 Long term (current) use of aspirin: Secondary | ICD-10-CM | POA: Diagnosis not present

## 2017-10-13 DIAGNOSIS — Z87891 Personal history of nicotine dependence: Secondary | ICD-10-CM | POA: Diagnosis not present

## 2017-10-13 DIAGNOSIS — S81812A Laceration without foreign body, left lower leg, initial encounter: Secondary | ICD-10-CM | POA: Insufficient documentation

## 2017-10-13 DIAGNOSIS — Z79899 Other long term (current) drug therapy: Secondary | ICD-10-CM | POA: Insufficient documentation

## 2017-10-13 DIAGNOSIS — S81012A Laceration without foreign body, left knee, initial encounter: Secondary | ICD-10-CM | POA: Diagnosis not present

## 2017-10-13 DIAGNOSIS — Y929 Unspecified place or not applicable: Secondary | ICD-10-CM | POA: Insufficient documentation

## 2017-10-13 DIAGNOSIS — Z23 Encounter for immunization: Secondary | ICD-10-CM | POA: Insufficient documentation

## 2017-10-13 DIAGNOSIS — Y939 Activity, unspecified: Secondary | ICD-10-CM | POA: Diagnosis not present

## 2017-10-13 DIAGNOSIS — E11649 Type 2 diabetes mellitus with hypoglycemia without coma: Secondary | ICD-10-CM | POA: Insufficient documentation

## 2017-10-13 DIAGNOSIS — Z955 Presence of coronary angioplasty implant and graft: Secondary | ICD-10-CM | POA: Insufficient documentation

## 2017-10-13 DIAGNOSIS — I130 Hypertensive heart and chronic kidney disease with heart failure and stage 1 through stage 4 chronic kidney disease, or unspecified chronic kidney disease: Secondary | ICD-10-CM | POA: Insufficient documentation

## 2017-10-13 DIAGNOSIS — E162 Hypoglycemia, unspecified: Secondary | ICD-10-CM

## 2017-10-13 DIAGNOSIS — Z7984 Long term (current) use of oral hypoglycemic drugs: Secondary | ICD-10-CM | POA: Insufficient documentation

## 2017-10-13 DIAGNOSIS — N183 Chronic kidney disease, stage 3 (moderate): Secondary | ICD-10-CM | POA: Insufficient documentation

## 2017-10-13 DIAGNOSIS — Y999 Unspecified external cause status: Secondary | ICD-10-CM | POA: Insufficient documentation

## 2017-10-13 DIAGNOSIS — I2510 Atherosclerotic heart disease of native coronary artery without angina pectoris: Secondary | ICD-10-CM | POA: Insufficient documentation

## 2017-10-13 DIAGNOSIS — W293XXA Contact with powered garden and outdoor hand tools and machinery, initial encounter: Secondary | ICD-10-CM | POA: Insufficient documentation

## 2017-10-13 DIAGNOSIS — I5032 Chronic diastolic (congestive) heart failure: Secondary | ICD-10-CM | POA: Diagnosis not present

## 2017-10-13 LAB — CBG MONITORING, ED
Glucose-Capillary: 50 mg/dL — ABNORMAL LOW (ref 70–99)
Glucose-Capillary: 87 mg/dL (ref 70–99)

## 2017-10-13 MED ORDER — LIDOCAINE HCL (PF) 1 % IJ SOLN
10.0000 mL | Freq: Once | INTRAMUSCULAR | Status: AC
  Administered 2017-10-13: 10 mL
  Filled 2017-10-13: qty 10

## 2017-10-13 MED ORDER — BACITRACIN-NEOMYCIN-POLYMYXIN 400-5-5000 EX OINT: TOPICAL_OINTMENT | Freq: Once | CUTANEOUS | Status: DC

## 2017-10-13 MED ORDER — BACITRACIN-NEOMYCIN-POLYMYXIN 400-5-5000 EX OINT
TOPICAL_OINTMENT | CUTANEOUS | Status: AC
  Administered 2017-10-13: 1
  Filled 2017-10-13: qty 1

## 2017-10-13 MED ORDER — POVIDONE-IODINE 10 % EX SOLN
CUTANEOUS | Status: DC | PRN
  Administered 2017-10-13: 1 via TOPICAL
  Filled 2017-10-13: qty 15

## 2017-10-13 MED ORDER — TETANUS-DIPHTH-ACELL PERTUSSIS 5-2.5-18.5 LF-MCG/0.5 IM SUSP
0.5000 mL | Freq: Once | INTRAMUSCULAR | Status: AC
  Administered 2017-10-13: 0.5 mL via INTRAMUSCULAR
  Filled 2017-10-13: qty 0.5

## 2017-10-13 NOTE — ED Triage Notes (Signed)
Pt ws using his chainsaw and it kicked back, cutting his left knee. Small laceration to knee cap.  Bleeding controlled.

## 2017-10-13 NOTE — Discharge Instructions (Addendum)
Have your sutures removed in 10 days.  Keep your wound clean and dry,  Until a good scab forms - you may then wash gently twice daily with mild soap and water, but dry completely after.  Apply a layer of antibiotic ointment before applying a new dressing.  Get rechecked for any sign of infection (redness,  Swelling,  Increased pain or drainage of purulent fluid).  Make sure you eat a full meal once you are home.
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
## 2017-10-15 ENCOUNTER — Encounter: Payer: Self-pay | Admitting: Nurse Practitioner

## 2017-10-16 DIAGNOSIS — Z23 Encounter for immunization: Secondary | ICD-10-CM | POA: Diagnosis not present

## 2017-10-19 ENCOUNTER — Other Ambulatory Visit: Payer: Self-pay | Admitting: Cardiovascular Disease

## 2017-10-23 DIAGNOSIS — S81012D Laceration without foreign body, left knee, subsequent encounter: Secondary | ICD-10-CM | POA: Diagnosis not present

## 2017-10-23 DIAGNOSIS — Z6832 Body mass index (BMI) 32.0-32.9, adult: Secondary | ICD-10-CM | POA: Diagnosis not present

## 2017-10-23 DIAGNOSIS — E6609 Other obesity due to excess calories: Secondary | ICD-10-CM | POA: Diagnosis not present

## 2017-11-17 DIAGNOSIS — N2581 Secondary hyperparathyroidism of renal origin: Secondary | ICD-10-CM | POA: Diagnosis not present

## 2017-11-17 DIAGNOSIS — N183 Chronic kidney disease, stage 3 (moderate): Secondary | ICD-10-CM | POA: Diagnosis not present

## 2017-11-17 DIAGNOSIS — I129 Hypertensive chronic kidney disease with stage 1 through stage 4 chronic kidney disease, or unspecified chronic kidney disease: Secondary | ICD-10-CM | POA: Diagnosis not present

## 2017-11-17 DIAGNOSIS — E1122 Type 2 diabetes mellitus with diabetic chronic kidney disease: Secondary | ICD-10-CM | POA: Diagnosis not present

## 2017-11-17 DIAGNOSIS — R809 Proteinuria, unspecified: Secondary | ICD-10-CM | POA: Diagnosis not present

## 2017-11-18 DIAGNOSIS — Z681 Body mass index (BMI) 19 or less, adult: Secondary | ICD-10-CM | POA: Diagnosis not present

## 2017-11-18 DIAGNOSIS — Z125 Encounter for screening for malignant neoplasm of prostate: Secondary | ICD-10-CM | POA: Diagnosis not present

## 2017-11-18 DIAGNOSIS — N183 Chronic kidney disease, stage 3 (moderate): Secondary | ICD-10-CM | POA: Diagnosis not present

## 2017-11-18 DIAGNOSIS — E785 Hyperlipidemia, unspecified: Secondary | ICD-10-CM | POA: Diagnosis not present

## 2017-11-18 DIAGNOSIS — I10 Essential (primary) hypertension: Secondary | ICD-10-CM | POA: Diagnosis not present

## 2017-11-18 DIAGNOSIS — R809 Proteinuria, unspecified: Secondary | ICD-10-CM | POA: Diagnosis not present

## 2017-11-18 DIAGNOSIS — Z0001 Encounter for general adult medical examination with abnormal findings: Secondary | ICD-10-CM | POA: Diagnosis not present

## 2017-11-18 DIAGNOSIS — I129 Hypertensive chronic kidney disease with stage 1 through stage 4 chronic kidney disease, or unspecified chronic kidney disease: Secondary | ICD-10-CM | POA: Diagnosis not present

## 2017-11-18 DIAGNOSIS — Z1389 Encounter for screening for other disorder: Secondary | ICD-10-CM | POA: Diagnosis not present

## 2017-11-18 DIAGNOSIS — E214 Other specified disorders of parathyroid gland: Secondary | ICD-10-CM | POA: Diagnosis not present

## 2017-11-18 DIAGNOSIS — E1122 Type 2 diabetes mellitus with diabetic chronic kidney disease: Secondary | ICD-10-CM | POA: Diagnosis not present

## 2017-11-18 DIAGNOSIS — E559 Vitamin D deficiency, unspecified: Secondary | ICD-10-CM | POA: Diagnosis not present

## 2017-11-30 ENCOUNTER — Other Ambulatory Visit: Payer: Self-pay | Admitting: Cardiovascular Disease

## 2018-01-11 DIAGNOSIS — Z6830 Body mass index (BMI) 30.0-30.9, adult: Secondary | ICD-10-CM | POA: Diagnosis not present

## 2018-01-11 DIAGNOSIS — I10 Essential (primary) hypertension: Secondary | ICD-10-CM | POA: Diagnosis not present

## 2018-01-11 DIAGNOSIS — E669 Obesity, unspecified: Secondary | ICD-10-CM | POA: Diagnosis not present

## 2018-01-11 DIAGNOSIS — M1991 Primary osteoarthritis, unspecified site: Secondary | ICD-10-CM | POA: Diagnosis not present

## 2018-01-11 DIAGNOSIS — E119 Type 2 diabetes mellitus without complications: Secondary | ICD-10-CM | POA: Diagnosis not present

## 2018-01-14 ENCOUNTER — Other Ambulatory Visit: Payer: Self-pay | Admitting: Cardiovascular Disease

## 2018-01-21 ENCOUNTER — Telehealth: Payer: Self-pay

## 2018-01-21 ENCOUNTER — Ambulatory Visit (INDEPENDENT_AMBULATORY_CARE_PROVIDER_SITE_OTHER): Payer: Medicare Other | Admitting: Nurse Practitioner

## 2018-01-21 ENCOUNTER — Encounter: Payer: Self-pay | Admitting: Nurse Practitioner

## 2018-01-21 ENCOUNTER — Encounter: Payer: Self-pay | Admitting: *Deleted

## 2018-01-21 ENCOUNTER — Other Ambulatory Visit: Payer: Self-pay | Admitting: *Deleted

## 2018-01-21 DIAGNOSIS — Z01818 Encounter for other preprocedural examination: Secondary | ICD-10-CM | POA: Diagnosis not present

## 2018-01-21 DIAGNOSIS — Z800 Family history of malignant neoplasm of digestive organs: Secondary | ICD-10-CM | POA: Diagnosis not present

## 2018-01-21 MED ORDER — NA SULFATE-K SULFATE-MG SULF 17.5-3.13-1.6 GM/177ML PO SOLN: 1.0000 | ORAL | 0 refills | Status: DC

## 2018-01-21 NOTE — Assessment & Plan Note (Signed)
Noted family history of colon cancer in his brother who was diagnosed in his 68s and passed away at age 72.  He does not member when his last colonoscopy is.  His primary care referred him because he is due.  Based on our best records his last colonoscopy was in 2011 he is currently overdue.  We will proceed with colonoscopy at this time for screening of an individual at high risk for colon cancer.  Generally asymptomatic from a GI standpoint return for follow-up based on recommendations made after colonoscopy.  Proceed with TCS on propofol/MAC with Dr. Gala Romney in near future: the risks, benefits, and alternatives have been discussed with the patient in detail. The patient states understanding and desires to proceed.  The patient is currently on BuSpar and hydrocodone.  No other anticoagulants, anxiolytics, chronic pain medications, or antidepressants.  Social/occasional alcohol use, denies recreational drugs.  We will plan for the procedure on propofol/MAC to promote adequate sedation.

## 2018-01-21 NOTE — Patient Instructions (Signed)
Your health issues we discussed today were:   Family history of colon cancer and need for colonoscopy: 1. Per our best records your last colonoscopy was in 2011 and you are due currently 2. We will schedule your colonoscopy for you. 3. Further recommendations will be made after your colonoscopy.  Overall I recommend:  1. Return for follow-up based on recommendations made after your colonoscopy. 2. Call us if you have any GI problems. 3. Call us if you have any questions or concerns.  At [facility] we value your feedback. You may receive a survey about your visit today. Please share your experience as we strive to create trusting relationships with our patients to provide genuine, compassionate, quality care.  We appreciate your understanding and patience as we review any laboratory studies, imaging, and other diagnostic tests that are ordered as we care for you. Our office policy is 5 business days for review of these results, and any emergent or urgent results are addressed in a timely manner for your best interest. If you do not hear from our office in 1 week, please contact us.   We also encourage the use of MyChart, which contains your medical information for your review as well. If you are not enrolled in this feature, an access code is on this after visit summary for your convenience. Thank you for allowing [place] to be involved in your care.  It was great to see you today!  I hope you have a great day!!
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
## 2018-01-21 NOTE — Telephone Encounter (Signed)
Called and informed pt of pre-op appt 02/12/18 at 11:00am. Letter mailed.
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
## 2018-01-25 ENCOUNTER — Other Ambulatory Visit: Payer: Self-pay | Admitting: Cardiovascular Disease

## 2018-01-25 NOTE — Progress Notes (Signed)
CC'D TO PCP °

## 2018-02-08 NOTE — Patient Instructions (Signed)
William [name]  02/08/2018     @PREFPERIOPPHARMACY @   Your procedure is scheduled on  02/15/2018  Report to [facility] at  700   A.M.  Call this number if you have problems the morning of surgery:  [phone]   Remember:  Follow the diet and prep instructions given to you by Dr Roseanne Kaufman office.                      Take these medicines the morning of surgery with A SIP OF WATER amlodipine, buspar, hydrocodone ( if needed), lisinopril, metoprolol, protonix, flomax.    Do not wear jewelry, make-up or nail polish.  Do not wear lotions, powders, or perfumes, or deodorant.  Do not shave 48 hours prior to surgery.  Men may shave face and neck.  Do not bring valuables to the hospital.  [facility] is not responsible for any belongings or valuables.  Contacts, dentures or bridgework may not be worn into surgery.  Leave your suitcase in the car.  After surgery it may be brought to your room.  For patients admitted to the hospital, discharge time will be determined by your treatment team.  Patients discharged the day of surgery will not be allowed to drive home.   Name and phone number of your driver:   family Special instructions:  Take 1/2 of your levimir the night before your procedure. DO NOT take any medications for diabetes the morning of your procedure.  Please read over the following fact sheets that you were given. Anesthesia Post-op Instructions and Care and Recovery After Surgery       Colonoscopy, Adult A colonoscopy is an exam to look at the large intestine. It is done to check for problems, such as:  Lumps (tumors).  Growths (polyps).  Swelling (inflammation).  Bleeding. What happens before the procedure? Eating and drinking Follow instructions from your doctor about eating and drinking. These instructions may include:  A few days before the procedure - follow a low-fiber diet. ? Avoid nuts. ? Avoid seeds. ? Avoid dried fruit. ? Avoid raw  fruits. ? Avoid vegetables.  1-3 days before the procedure - follow a clear liquid diet. Avoid liquids that have red or purple dye. Drink only clear liquids, such as: ? Clear broth or bouillon. ? Black coffee or tea. ? Clear juice. ? Clear soft drinks or sports drinks. ? Gelatin dessert. ? Popsicles.  On the day of the procedure - do not eat or drink anything during the 2 hours before the procedure. Up to 2 hours before the procedure, you may continue to drink clear liquids, such as water or clear fruit juice.  Bowel prep If you were prescribed an oral bowel prep:  Take it as told by your doctor. Starting the day before your procedure, you will need to drink a lot of liquid. The liquid will cause you to poop (have bowel movements) until your poop is almost clear or light green.  To clean out your colon, you may also be given: ? Laxative medicines. ? Instructions about how to use an enema.  If your skin or butt gets irritated from diarrhea, you may: ? Wipe the area with wipes that have medicine in them, such as adult wet wipes with aloe and vitamin E. ? Put something on your skin that soothes the area, such as petroleum jelly.  If you throw up (vomit) while drinking the  bowel prep, take a break for up to 60 minutes. Then begin the bowel prep again. If you keep throwing up and you cannot take the bowel prep without throwing up, call your doctor. General instructions  Ask your doctor about: ? Changing or stopping your normal medicines. This is important if you take iron pills, diabetes medicines, or blood thinners. ? Taking medicines such as aspirin and ibuprofen. These medicines can thin your blood. Do not take these medicines unless your doctor tells you to take them.  Plan to have someone take you home from the hospital or clinic. What happens during the procedure?   An IV tube may be put into one of your veins.  You will be given medicine to help you relax (sedative).  To  reduce your risk of infection: ? Your doctors will wash their hands. ? Your anal area will be washed with soap.  You will be asked to lie on your side with your knees bent.  Your doctor will get a long, thin, flexible tube ready. The tube will have a camera and a light on the end.  The tube will be put into your anus.  The tube will be gently put into your large intestine.  Air will be delivered into your large intestine to keep it open. You may feel some pressure or cramping.  The camera will be used to take photos.  A small tissue sample may be removed for testing (biopsy).  If small growths are found, your doctor may remove them and have them checked for cancer.  The tube that was put into your anus will be slowly removed. The procedure may vary among doctors and hospitals. What happens after the procedure?  Your doctor will check on you often until the medicines you were given have worn off.  Do not drive for 24 hours after the procedure.  You may have a small amount of blood in your poop.  You may pass gas.  You may have mild cramps or bloating in your belly (abdomen).  It is up to you to get the results of your procedure. Ask your doctor, or the department performing the procedure, when your results will be ready. Summary  A colonoscopy is an exam to look at the large intestine.  Follow instructions from your doctor about eating and drinking before the procedure.  If you were prescribed an oral bowel prep to clean out your colon, take it as told by your doctor.  Your doctor will check on you often until the medicines you were given have worn off.  Plan to have someone take you home from the hospital or clinic. This information is not intended to replace advice given to you by your health care provider. Make sure you discuss any questions you have with your health care provider. Document Released: 01/25/2010 Document Revised: 10/22/2016 Document Reviewed:  03/06/2015 Elsevier Interactive Patient Education  2019 [employer]  Colonoscopy, Adult, Care After This sheet gives you information about how to care for yourself after your procedure. Your health care provider may also give you more specific instructions. If you have problems or questions, contact your health care provider. What can I expect after the procedure? After the procedure, it is common to have:  A small amount of blood in your stool for 24 hours after the procedure.  Some gas.  Mild abdominal cramping or bloating. Follow these instructions at home: General instructions  For the first 24 hours after the procedure: ? Do  not drive or use machinery. ? Do not sign important documents. ? Do not drink alcohol. ? Do your regular daily activities at a slower pace than normal. ? Eat soft, easy-to-digest foods.  Take over-the-counter or prescription medicines only as told by your health care provider. Relieving cramping and bloating   Try walking around when you have cramps or feel bloated.  Apply heat to your abdomen as told by your health care provider. Use a heat source that your health care provider recommends, such as a moist heat pack or a heating pad. ? Place a towel between your skin and the heat source. ? Leave the heat on for 20-30 minutes. ? Remove the heat if your skin turns bright red. This is especially important if you are unable to feel pain, heat, or cold. You may have a greater risk of getting burned. Eating and drinking   Drink enough fluid to keep your urine pale yellow.  Resume your normal diet as instructed by your health care provider. Avoid heavy or fried foods that are hard to digest.  Avoid drinking alcohol for as long as instructed by your health care provider. Contact a health care provider if:  You have blood in your stool 2-3 days after the procedure. Get help right away if:  You have more than a small spotting of blood in your  stool.  You pass large blood clots in your stool.  Your abdomen is swollen.  You have nausea or vomiting.  You have a fever.  You have increasing abdominal pain that is not relieved with medicine. Summary  After the procedure, it is common to have a small amount of blood in your stool. You may also have mild abdominal cramping and bloating.  For the first 24 hours after the procedure, do not drive or use machinery, sign important documents, or drink alcohol.  Contact your health care provider if you have a lot of blood in your stool, nausea or vomiting, a fever, or increased abdominal pain. This information is not intended to replace advice given to you by your health care provider. Make sure you discuss any questions you have with your health care provider. Document Released: 08/07/2003 Document Revised: 10/15/2016 Document Reviewed: 03/06/2015 Elsevier Interactive Patient Education  2019 [place] Anesthesia is a term that refers to techniques, procedures, and medicines that help a person stay safe and comfortable during a medical procedure. Monitored anesthesia care, or sedation, is one type of anesthesia. Your anesthesia specialist may recommend sedation if you will be having a procedure that does not require you to be unconscious, such as:  Cataract surgery.  A dental procedure.  A biopsy.  A colonoscopy. During the procedure, you may receive a medicine to help you relax (sedative). There are three levels of sedation:  Mild sedation. At this level, you may feel awake and relaxed. You will be able to follow directions.  Moderate sedation. At this level, you will be sleepy. You may not remember the procedure.  Deep sedation. At this level, you will be asleep. You will not remember the procedure. The more medicine you are given, the deeper your level of sedation will be. Depending on how you respond to the procedure, the anesthesia specialist  may change your level of sedation or the type of anesthesia to fit your needs. An anesthesia specialist will monitor you closely during the procedure. Let your health care provider know about:  Any allergies you have.  All medicines  you are taking, including vitamins, herbs, eye drops, creams, and over-the-counter medicines.  Any use of steroids (by mouth or as a cream).  Any problems you or family members have had with sedatives and anesthetic medicines.  Any blood disorders you have.  Any surgeries you have had.  Any medical conditions you have, such as sleep apnea.  Whether you are pregnant or may be pregnant.  Any use of cigarettes, alcohol, or street drugs. What are the risks? Generally, this is a safe procedure. However, problems may occur, including:  Getting too much medicine (oversedation).  Nausea.  Allergic reaction to medicines.  Trouble breathing. If this happens, a breathing tube may be used to help with breathing. It will be removed when you are awake and breathing on your own.  Heart trouble.  Lung trouble. Before the procedure Staying hydrated Follow instructions from your health care provider about hydration, which may include:  Up to 2 hours before the procedure - you may continue to drink clear liquids, such as water, clear fruit juice, black coffee, and plain tea. Eating and drinking restrictions Follow instructions from your health care provider about eating and drinking, which may include:  8 hours before the procedure - stop eating heavy meals or foods such as meat, fried foods, or fatty foods.  6 hours before the procedure - stop eating light meals or foods, such as toast or cereal.  6 hours before the procedure - stop drinking milk or drinks that contain milk.  2 hours before the procedure - stop drinking clear liquids. Medicines Ask your health care provider about:  Changing or stopping your regular medicines. This is especially important  if you are taking diabetes medicines or blood thinners.  Taking medicines such as aspirin and ibuprofen. These medicines can thin your blood. Do not take these medicines before your procedure if your health care provider instructs you not to. Tests and exams  You will have a physical exam.  You may have blood tests done to show: ? How well your kidneys and liver are working. ? How well your blood can clot. General instructions  Plan to have someone take you home from the hospital or clinic.  If you will be going home right after the procedure, plan to have someone with you for 24 hours.  What happens during the procedure?  Your blood pressure, heart rate, breathing, level of pain and overall condition will be monitored.  An IV tube will be inserted into one of your veins.  Your anesthesia specialist will give you medicines as needed to keep you comfortable during the procedure. This may mean changing the level of sedation.  The procedure will be performed. After the procedure  Your blood pressure, heart rate, breathing rate, and blood oxygen level will be monitored until the medicines you were given have worn off.  Do not drive for 24 hours if you received a sedative.  You may: ? Feel sleepy, clumsy, or nauseous. ? Feel forgetful about what happened after the procedure. ? Have a sore throat if you had a breathing tube during the procedure. ? Vomit. This information is not intended to replace advice given to you by your health care provider. Make sure you discuss any questions you have with your health care provider. Document Released: 09/18/2004 Document Revised: 06/01/2015 Document Reviewed: 04/15/2015 Elsevier Interactive Patient Education  2019 [place], Care After These instructions provide you with information about caring for yourself after your procedure. Your health  care provider may also give you more specific instructions. Your  treatment has been planned according to current medical practices, but problems sometimes occur. Call your health care provider if you have any problems or questions after your procedure. What can I expect after the procedure? After your procedure, you may:  Feel sleepy for several hours.  Feel clumsy and have poor balance for several hours.  Feel forgetful about what happened after the procedure.  Have poor judgment for several hours.  Feel nauseous or vomit.  Have a sore throat if you had a breathing tube during the procedure. Follow these instructions at home: For at least 24 hours after the procedure:      Have a responsible adult stay with you. It is important to have someone help care for you until you are awake and alert.  Rest as needed.  Do not: ? Participate in activities in which you could fall or become injured. ? Drive. ? Use heavy machinery. ? Drink alcohol. ? Take sleeping pills or medicines that cause drowsiness. ? Make important decisions or sign legal documents. ? Take care of children on your own. Eating and drinking  Follow the diet that is recommended by your health care provider.  If you vomit, drink water, juice, or soup when you can drink without vomiting.  Make sure you have little or no nausea before eating solid foods. General instructions  Take over-the-counter and prescription medicines only as told by your health care provider.  If you have sleep apnea, surgery and certain medicines can increase your risk for breathing problems. Follow instructions from your health care provider about wearing your sleep device: ? Anytime you are sleeping, including during daytime naps. ? While taking prescription pain medicines, sleeping medicines, or medicines that make you drowsy.  If you smoke, do not smoke without supervision.  Keep all follow-up visits as told by your health care provider. This is important. Contact a health care provider  if:  You keep feeling nauseous or you keep vomiting.  You feel light-headed.  You develop a rash.  You have a fever. Get help right away if:  You have trouble breathing. Summary  For several hours after your procedure, you may feel sleepy and have poor judgment.  Have a responsible adult stay with you for at least 24 hours or until you are awake and alert. This information is not intended to replace advice given to you by your health care provider. Make sure you discuss any questions you have with your health care provider. Document Released: 04/15/2015 Document Revised: 08/08/2016 Document Reviewed: 04/15/2015 Elsevier Interactive Patient Education  2019 [employer].

## 2018-02-10 ENCOUNTER — Other Ambulatory Visit (HOSPITAL_COMMUNITY): Payer: Medicare Other

## 2018-02-10 DIAGNOSIS — M25559 Pain in unspecified hip: Secondary | ICD-10-CM | POA: Diagnosis not present

## 2018-02-10 DIAGNOSIS — Z6831 Body mass index (BMI) 31.0-31.9, adult: Secondary | ICD-10-CM | POA: Diagnosis not present

## 2018-02-10 DIAGNOSIS — G894 Chronic pain syndrome: Secondary | ICD-10-CM | POA: Diagnosis not present

## 2018-02-10 DIAGNOSIS — M25569 Pain in unspecified knee: Secondary | ICD-10-CM | POA: Diagnosis not present

## 2018-02-12 ENCOUNTER — Encounter (HOSPITAL_COMMUNITY)
Admission: RE | Admit: 2018-02-12 | Discharge: 2018-02-12 | Disposition: A | Discharge status: Home / Self Care | Payer: Medicare Other | Source: Ambulatory Visit | Attending: Internal Medicine | Admitting: Internal Medicine

## 2018-02-12 ENCOUNTER — Encounter (HOSPITAL_COMMUNITY): Payer: Self-pay

## 2018-02-12 ENCOUNTER — Other Ambulatory Visit: Payer: Self-pay

## 2018-02-12 DIAGNOSIS — K6389 Other specified diseases of intestine: Secondary | ICD-10-CM | POA: Diagnosis not present

## 2018-02-12 DIAGNOSIS — Z955 Presence of coronary angioplasty implant and graft: Secondary | ICD-10-CM | POA: Diagnosis not present

## 2018-02-12 DIAGNOSIS — Z01818 Encounter for other preprocedural examination: Secondary | ICD-10-CM

## 2018-02-12 DIAGNOSIS — Z79899 Other long term (current) drug therapy: Secondary | ICD-10-CM | POA: Diagnosis not present

## 2018-02-12 DIAGNOSIS — I129 Hypertensive chronic kidney disease with stage 1 through stage 4 chronic kidney disease, or unspecified chronic kidney disease: Secondary | ICD-10-CM | POA: Diagnosis not present

## 2018-02-12 DIAGNOSIS — N189 Chronic kidney disease, unspecified: Secondary | ICD-10-CM | POA: Diagnosis not present

## 2018-02-12 DIAGNOSIS — E7800 Pure hypercholesterolemia, unspecified: Secondary | ICD-10-CM | POA: Diagnosis not present

## 2018-02-12 DIAGNOSIS — Z7902 Long term (current) use of antithrombotics/antiplatelets: Secondary | ICD-10-CM | POA: Diagnosis not present

## 2018-02-12 DIAGNOSIS — I2510 Atherosclerotic heart disease of native coronary artery without angina pectoris: Secondary | ICD-10-CM | POA: Diagnosis not present

## 2018-02-12 DIAGNOSIS — K5730 Diverticulosis of large intestine without perforation or abscess without bleeding: Secondary | ICD-10-CM | POA: Diagnosis not present

## 2018-02-12 DIAGNOSIS — K219 Gastro-esophageal reflux disease without esophagitis: Secondary | ICD-10-CM | POA: Diagnosis not present

## 2018-02-12 DIAGNOSIS — Z794 Long term (current) use of insulin: Secondary | ICD-10-CM | POA: Diagnosis not present

## 2018-02-12 DIAGNOSIS — F419 Anxiety disorder, unspecified: Secondary | ICD-10-CM | POA: Diagnosis not present

## 2018-02-12 DIAGNOSIS — Z800 Family history of malignant neoplasm of digestive organs: Secondary | ICD-10-CM | POA: Diagnosis not present

## 2018-02-12 DIAGNOSIS — Q438 Other specified congenital malformations of intestine: Secondary | ICD-10-CM | POA: Diagnosis not present

## 2018-02-12 DIAGNOSIS — D120 Benign neoplasm of cecum: Secondary | ICD-10-CM | POA: Diagnosis not present

## 2018-02-12 DIAGNOSIS — Z7982 Long term (current) use of aspirin: Secondary | ICD-10-CM | POA: Diagnosis not present

## 2018-02-12 DIAGNOSIS — Z87891 Personal history of nicotine dependence: Secondary | ICD-10-CM | POA: Diagnosis not present

## 2018-02-12 DIAGNOSIS — D123 Benign neoplasm of transverse colon: Secondary | ICD-10-CM | POA: Diagnosis not present

## 2018-02-12 DIAGNOSIS — K621 Rectal polyp: Secondary | ICD-10-CM | POA: Diagnosis not present

## 2018-02-12 DIAGNOSIS — Z1211 Encounter for screening for malignant neoplasm of colon: Secondary | ICD-10-CM | POA: Diagnosis not present

## 2018-02-12 DIAGNOSIS — E1122 Type 2 diabetes mellitus with diabetic chronic kidney disease: Secondary | ICD-10-CM | POA: Diagnosis not present

## 2018-02-12 HISTORY — DX: Anxiety disorder, unspecified: F41.9

## 2018-02-12 HISTORY — DX: Malignant (primary) neoplasm, unspecified: C80.1

## 2018-02-12 HISTORY — DX: Cardiac arrhythmia, unspecified: I49.9

## 2018-02-12 HISTORY — DX: Personal history of urinary calculi: Z87.442

## 2018-02-12 LAB — BASIC METABOLIC PANEL
Anion gap: 9 (ref 5–15)
BUN: 22 mg/dL (ref 8–23)
CHLORIDE: 108 mmol/L (ref 98–111)
CO2: 24 mmol/L (ref 22–32)
Calcium: 9.5 mg/dL (ref 8.9–10.3)
Creatinine, Ser: 1.2 mg/dL (ref 0.61–1.24)
GFR calc Af Amer: >60 mL/min (ref >60)
GFR calc non Af Amer: >60 mL/min (ref >60)
Glucose, Bld: 182 mg/dL — ABNORMAL HIGH (ref 70–99)
Potassium: 4 mmol/L (ref 3.5–5.1)
Sodium: 141 mmol/L (ref 135–145)

## 2018-02-15 ENCOUNTER — Encounter (HOSPITAL_COMMUNITY)
Admission: RE | Disposition: A | Discharge status: Home / Self Care | Payer: Self-pay | Source: Home / Self Care | Attending: Internal Medicine

## 2018-02-15 ENCOUNTER — Ambulatory Visit (HOSPITAL_COMMUNITY): Payer: Medicare Other | Admitting: Anesthesiology

## 2018-02-15 ENCOUNTER — Ambulatory Visit (HOSPITAL_COMMUNITY)
Admission: RE | Admit: 2018-02-15 | Discharge: 2018-02-15 | Disposition: A | Discharge status: Home / Self Care | Payer: Medicare Other | Attending: Internal Medicine | Admitting: Internal Medicine

## 2018-02-15 ENCOUNTER — Encounter (HOSPITAL_COMMUNITY): Payer: Self-pay | Admitting: *Deleted

## 2018-02-15 DIAGNOSIS — D120 Benign neoplasm of cecum: Secondary | ICD-10-CM

## 2018-02-15 DIAGNOSIS — Z87891 Personal history of nicotine dependence: Secondary | ICD-10-CM | POA: Insufficient documentation

## 2018-02-15 DIAGNOSIS — K5730 Diverticulosis of large intestine without perforation or abscess without bleeding: Secondary | ICD-10-CM | POA: Insufficient documentation

## 2018-02-15 DIAGNOSIS — K621 Rectal polyp: Secondary | ICD-10-CM | POA: Diagnosis not present

## 2018-02-15 DIAGNOSIS — I2510 Atherosclerotic heart disease of native coronary artery without angina pectoris: Secondary | ICD-10-CM | POA: Insufficient documentation

## 2018-02-15 DIAGNOSIS — K6389 Other specified diseases of intestine: Secondary | ICD-10-CM | POA: Insufficient documentation

## 2018-02-15 DIAGNOSIS — K219 Gastro-esophageal reflux disease without esophagitis: Secondary | ICD-10-CM | POA: Diagnosis not present

## 2018-02-15 DIAGNOSIS — Z1211 Encounter for screening for malignant neoplasm of colon: Secondary | ICD-10-CM | POA: Diagnosis not present

## 2018-02-15 DIAGNOSIS — F419 Anxiety disorder, unspecified: Secondary | ICD-10-CM | POA: Diagnosis not present

## 2018-02-15 DIAGNOSIS — Z79899 Other long term (current) drug therapy: Secondary | ICD-10-CM | POA: Insufficient documentation

## 2018-02-15 DIAGNOSIS — D123 Benign neoplasm of transverse colon: Secondary | ICD-10-CM

## 2018-02-15 DIAGNOSIS — Z800 Family history of malignant neoplasm of digestive organs: Secondary | ICD-10-CM | POA: Insufficient documentation

## 2018-02-15 DIAGNOSIS — E7800 Pure hypercholesterolemia, unspecified: Secondary | ICD-10-CM | POA: Insufficient documentation

## 2018-02-15 DIAGNOSIS — Q438 Other specified congenital malformations of intestine: Secondary | ICD-10-CM | POA: Insufficient documentation

## 2018-02-15 DIAGNOSIS — N189 Chronic kidney disease, unspecified: Secondary | ICD-10-CM | POA: Insufficient documentation

## 2018-02-15 DIAGNOSIS — I129 Hypertensive chronic kidney disease with stage 1 through stage 4 chronic kidney disease, or unspecified chronic kidney disease: Secondary | ICD-10-CM | POA: Insufficient documentation

## 2018-02-15 DIAGNOSIS — D126 Benign neoplasm of colon, unspecified: Secondary | ICD-10-CM | POA: Diagnosis not present

## 2018-02-15 DIAGNOSIS — E1122 Type 2 diabetes mellitus with diabetic chronic kidney disease: Secondary | ICD-10-CM | POA: Insufficient documentation

## 2018-02-15 DIAGNOSIS — Z7902 Long term (current) use of antithrombotics/antiplatelets: Secondary | ICD-10-CM | POA: Insufficient documentation

## 2018-02-15 DIAGNOSIS — Z955 Presence of coronary angioplasty implant and graft: Secondary | ICD-10-CM | POA: Insufficient documentation

## 2018-02-15 DIAGNOSIS — Z794 Long term (current) use of insulin: Secondary | ICD-10-CM | POA: Insufficient documentation

## 2018-02-15 DIAGNOSIS — Z7982 Long term (current) use of aspirin: Secondary | ICD-10-CM | POA: Insufficient documentation

## 2018-02-15 HISTORY — PX: POLYPECTOMY: SHX5525

## 2018-02-15 HISTORY — PX: COLONOSCOPY WITH PROPOFOL: SHX5780

## 2018-02-15 LAB — GLUCOSE, CAPILLARY
Glucose-Capillary: 154 mg/dL — ABNORMAL HIGH (ref 70–99)
Glucose-Capillary: 133 mg/dL — ABNORMAL HIGH (ref 70–99)

## 2018-02-15 SURGERY — COLONOSCOPY WITH PROPOFOL
Anesthesia: Monitor Anesthesia Care

## 2018-02-15 MED ORDER — HYDROCODONE-ACETAMINOPHEN 7.5-325 MG PO TABS: 1.0000 | ORAL_TABLET | Freq: Once | ORAL | Status: DC | PRN

## 2018-02-15 MED ORDER — MEPERIDINE HCL 100 MG/ML IJ SOLN: 6.2500 mg | INTRAMUSCULAR | Status: DC | PRN

## 2018-02-15 MED ORDER — LACTATED RINGERS IV SOLN: INTRAVENOUS | Status: DC

## 2018-02-15 MED ORDER — PROMETHAZINE HCL 25 MG/ML IJ SOLN: 6.2500 mg | INTRAMUSCULAR | Status: DC | PRN

## 2018-02-15 MED ORDER — CHLORHEXIDINE GLUCONATE CLOTH 2 % EX PADS: 6.0000 | MEDICATED_PAD | Freq: Once | CUTANEOUS | Status: DC

## 2018-02-15 MED ORDER — LACTATED RINGERS IV SOLN
INTRAVENOUS | Status: DC
  Administered 2018-02-15: 1000 mL via INTRAVENOUS

## 2018-02-15 MED ORDER — PROPOFOL 500 MG/50ML IV EMUL
INTRAVENOUS | Status: DC | PRN
  Administered 2018-02-15: 75 ug/kg/min via INTRAVENOUS
  Administered 2018-02-15: 70 ug/kg/min via INTRAVENOUS
  Administered 2018-02-15: 125 ug/kg/min via INTRAVENOUS
  Administered 2018-02-15: 70 ug/kg/min via INTRAVENOUS

## 2018-02-15 MED ORDER — PROPOFOL 10 MG/ML IV BOLUS
INTRAVENOUS | Status: DC | PRN
  Administered 2018-02-15 (×3): 20 mg via INTRAVENOUS
  Administered 2018-02-15: 60 mg via INTRAVENOUS
  Administered 2018-02-15 (×2): 20 mg via INTRAVENOUS

## 2018-02-15 MED ORDER — HYDROMORPHONE HCL 1 MG/ML IJ SOLN: 0.2500 mg | INTRAMUSCULAR | Status: DC | PRN

## 2018-02-15 NOTE — Discharge Instructions (Signed)
Colonoscopy Discharge Instructions  Read the instructions outlined below and refer to this sheet in the next few weeks. These discharge instructions provide you with general information on caring for yourself after you leave the hospital. Your doctor may also give you specific instructions. While your treatment has been planned according to the most current medical practices available, unavoidable complications occasionally occur. If you have any problems or questions after discharge, call Dr. Gala Romney at [phone]. ACTIVITY  You may resume your regular activity, but move at a slower pace for the next 24 hours.   Take frequent rest periods for the next 24 hours.   Walking will help get rid of the air and reduce the bloated feeling in your belly (abdomen).   No driving for 24 hours (because of the medicine (anesthesia) used during the test).    Do not sign any important legal documents or operate any machinery for 24 hours (because of the anesthesia used during the test).  NUTRITION  Drink plenty of fluids.   You may resume your normal diet as instructed by your doctor.   Begin with a light meal and progress to your normal diet. Heavy or fried foods are harder to digest and may make you feel sick to your stomach (nauseated).   Avoid alcoholic beverages for 24 hours or as instructed.  MEDICATIONS  You may resume your normal medications unless your doctor tells you otherwise.  WHAT YOU CAN EXPECT TODAY  Some feelings of bloating in the abdomen.   Passage of more gas than usual.   Spotting of blood in your stool or on the toilet paper.  IF YOU HAD POLYPS REMOVED DURING THE COLONOSCOPY:  No aspirin products for 10 days or as instructed.   No alcohol for 7 days or as instructed.   Eat a soft diet for the next 24 hours.  FINDING OUT THE RESULTS OF YOUR TEST Not all test results are available during your visit. If your test results are not back during the visit, make an appointment  with your caregiver to find out the results. Do not assume everything is normal if you have not heard from your caregiver or the medical facility. It is important for you to follow up on all of your test results.  SEEK IMMEDIATE MEDICAL ATTENTION IF:  You have more than a spotting of blood in your stool.   Your belly is swollen (abdominal distention).   You are nauseated or vomiting.   You have a temperature over 101.   You have abdominal pain or discomfort that is severe or gets worse throughout the day.   Diverticulosis and colon polyp information provided  No future MRI until clips gone  No aspirin or arthritis medications like ibuprofen x10 days  Further recommendations to follow pending review of pathology report  Office visit with [place] in 6 months   Aug 12th at 11:00 am   Colon Polyps  Polyps are tissue growths inside the body. Polyps can grow in many places, including the large intestine (colon). A polyp may be a round bump or a mushroom-shaped growth. You could have one polyp or several. Most colon polyps are noncancerous (benign). However, some colon polyps can become cancerous over time. Finding and removing the polyps early can help prevent this. What are the causes? The exact cause of colon polyps is not known. What increases the risk? You are more likely to develop this condition if you:  Have a family history of colon cancer or colon  polyps.  Are older than 32 or older than 45 if you are African American.  Have inflammatory bowel disease, such as ulcerative colitis or Crohn's disease.  Have certain hereditary conditions, such as: ? Familial adenomatous polyposis. ? Lynch syndrome. ? Turcot syndrome. ? Peutz-Jeghers syndrome.  Are overweight.  Smoke cigarettes.  Do not get enough exercise.  Drink too much alcohol.  Eat a diet that is high in fat and red meat and low in fiber.  Had childhood cancer that was treated with abdominal radiation. What are  the signs or symptoms? Most polyps do not cause symptoms. If you have symptoms, they may include:  Blood coming from your rectum when having a bowel movement.  Blood in your stool. The stool may look dark red or black.  Abdominal pain.  A change in bowel habits, such as constipation or diarrhea. How is this diagnosed? This condition is diagnosed with a colonoscopy. This is a procedure in which a lighted, flexible scope is inserted into the anus and then passed into the colon to examine the area. Polyps are sometimes found when a colonoscopy is done as part of routine cancer screening tests. How is this treated? Treatment for this condition involves removing any polyps that are found. Most polyps can be removed during a colonoscopy. Those polyps will then be tested for cancer. Additional treatment may be needed depending on the results of testing. Follow these instructions at home: Lifestyle  Maintain a healthy weight, or lose weight if recommended by your health care provider.  Exercise every day or as told by your health care provider.  Do not use any products that contain nicotine or tobacco, such as cigarettes and e-cigarettes. If you need help quitting, ask your health care provider.  If you drink alcohol, limit how much you have: ? 0-1 drink a day for women. ? 0-2 drinks a day for men.  Be aware of how much alcohol is in your drink. In the [place] one drink equals one 12 oz bottle of beer (355 mL), one 5 oz glass of wine (148 mL), or one 1 oz shot of hard liquor (44 mL). Eating and drinking   Eat foods that are high in fiber, such as fruits, vegetables, and whole grains.  Eat foods that are high in calcium and vitamin D, such as milk, cheese, yogurt, eggs, liver, fish, and broccoli.  Limit foods that are high in fat, such as fried foods and desserts.  Limit the amount of red meat and processed meat you eat, such as hot dogs, sausage, bacon, and lunch meats. General  instructions  Keep all follow-up visits as told by your health care provider. This is important. ? This includes having regularly scheduled colonoscopies. ? Talk to your health care provider about when you need a colonoscopy. Contact a health care provider if:  You have new or worsening bleeding during a bowel movement.  You have new or increased blood in your stool.  You have a change in bowel habits.  You lose weight for no known reason. Summary  Polyps are tissue growths inside the body. Polyps can grow in many places, including the colon.  Most colon polyps are noncancerous (benign), but some can become cancerous over time.  This condition is diagnosed with a colonoscopy.  Treatment for this condition involves removing any polyps that are found. Most polyps can be removed during a colonoscopy. This information is not intended to replace advice given to you by your health care provider.  Make sure you discuss any questions you have with your health care provider. Document Released: 09/19/2003 Document Revised: 04/09/2017 Document Reviewed: 04/09/2017 Elsevier Interactive Patient Education  2019 [employer].  Diverticulosis  Diverticulosis is a condition that develops when small pouches (diverticula) form in the wall of the large intestine (colon). The colon is where water is absorbed and stool is formed. The pouches form when the inside layer of the colon pushes through weak spots in the outer layers of the colon. You may have a few pouches or many of them. What are the causes? The cause of this condition is not known. What increases the risk? The following factors may make you more likely to develop this condition:  Being older than age 89. Your risk for this condition increases with age. Diverticulosis is rare among people younger than age 36. By age 36, many people have it.  Eating a low-fiber diet.  Having frequent constipation.  Being overweight.  Not getting  enough exercise.  Smoking.  Taking over-the-counter pain medicines, like aspirin and ibuprofen.  Having a family history of diverticulosis. What are the signs or symptoms? In most people, there are no symptoms of this condition. If you do have symptoms, they may include:  Bloating.  Cramps in the abdomen.  Constipation or diarrhea.  Pain in the lower left side of the abdomen. How is this diagnosed? This condition is most often diagnosed during an exam for other colon problems. Because diverticulosis usually has no symptoms, it often cannot be diagnosed independently. This condition may be diagnosed by:  Using a flexible scope to examine the colon (colonoscopy).  Taking an X-ray of the colon after dye has been put into the colon (barium enema).  Doing a CT scan. How is this treated? You may not need treatment for this condition if you have never developed an infection related to diverticulosis. If you have had an infection before, treatment may include:  Eating a high-fiber diet. This may include eating more fruits, vegetables, and grains.  Taking a fiber supplement.  Taking a live bacteria supplement (probiotic).  Taking medicine to relax your colon.  Taking antibiotic medicines. Follow these instructions at home:  Drink 6-8 glasses of water or more each day to prevent constipation.  Try not to strain when you have a bowel movement.  If you have had an infection before: ? Eat more fiber as directed by your health care provider or your diet and nutrition specialist (dietitian). ? Take a fiber supplement or probiotic, if your health care provider approves.  Take over-the-counter and prescription medicines only as told by your health care provider.  If you were prescribed an antibiotic, take it as told by your health care provider. Do not stop taking the antibiotic even if you start to feel better.  Keep all follow-up visits as told by your health care provider. This  is important. Contact a health care provider if:  You have pain in your abdomen.  You have bloating.  You have cramps.  You have not had a bowel movement in 3 days. Get help right away if:  Your pain gets worse.  Your bloating becomes very bad.  You have a fever or chills, and your symptoms suddenly get worse.  You vomit.  You have bowel movements that are bloody or black.  You have bleeding from your rectum. Summary  Diverticulosis is a condition that develops when small pouches (diverticula) form in the wall of the large intestine (colon).  You  may have a few pouches or many of them.  This condition is most often diagnosed during an exam for other colon problems.  If you have had an infection related to diverticulosis, treatment may include increasing the fiber in your diet, taking supplements, or taking medicines. This information is not intended to replace advice given to you by your health care provider. Make sure you discuss any questions you have with your health care provider. Document Released: 09/20/2003 Document Revised: 11/12/2015 Document Reviewed: 11/12/2015 Elsevier Interactive Patient Education  2019 [employer].

## 2018-02-15 NOTE — Anesthesia Procedure Notes (Signed)
Procedure Name: MAC Date/Time: 02/15/2018 8:12 AM Performed by: Vista Deck, CRNA Pre-anesthesia Checklist: Patient identified, Emergency Drugs available, Suction available, Timeout performed and Patient being monitored Patient Re-evaluated:Patient Re-evaluated prior to induction Oxygen Delivery Method: Nasal Cannula

## 2018-02-15 NOTE — Op Note (Signed)
[facility] Patient Name: William [name] Procedure Date: 02/15/2018 8:14 AM MRN: [id number] Date of Birth: 1945/01/07 Attending MD: [name] , MD CSN: [id number] Age: 73 Admit Type: Outpatient Procedure:                Colonoscopy Indications:              Screening in patient at increased risk: Family                            history of 1st-degree relative with colorectal                            cancer Providers:                [name], MD, Jeanann Lewandowsky. Sharon Seller, RN,                            Nelma Rothman, Technician Referring MD:              Medicines:                Propofol per Anesthesia Complications:            No immediate complications. Estimated Blood Loss:     Estimated blood loss was minimal. Procedure:                Pre-Anesthesia Assessment:                           - Prior to the procedure, a History and Physical                            was performed, and patient medications and                            allergies were reviewed. The patient's tolerance of                            previous anesthesia was also reviewed. The risks                            and benefits of the procedure and the sedation                            options and risks were discussed with the patient.                            All questions were answered, and informed consent                            was obtained. Prior Anticoagulants: The patient has                            taken no previous anticoagulant or antiplatelet                            agents.  ASA Grade Assessment: III - A patient with                            severe systemic disease. After reviewing the risks                            and benefits, the patient was deemed in                            satisfactory condition to undergo the procedure.                           After obtaining informed consent, the colonoscope                            was passed under direct vision.  Throughout the                            procedure, the patient's blood pressure, pulse, and                            oxygen saturations were monitored continuously. The                            CF-[id number] ([id number]) scope was introduced through                            the and advanced to the the cecum, identified by                            appendiceal orifice and ileocecal valve. The                            colonoscopy was performed without difficulty. The                            patient tolerated the procedure well. The quality                            of the bowel preparation was adequate. Scope In: 8:19:37 AM Scope Out: 9:36:25 AM Scope Withdrawal Time: 1 hour 1 minute 17 seconds  Total Procedure Duration: 1 hour 16 minutes 48 seconds  Findings:      The perianal and digital rectal examinations were normal. Redundant       colon. Melanosis coli present. External abdominal pressure and changing       the patient's position required to reach the cecum.      Multiple small and large-mouthed diverticula were found in the entire       colon.      12 semi-pedunculated polyps were found in the rectum, splenic flexure,       hepatic flexure, cecum and ileocecal valve. The polyps were 5 to 20 mm       in size. These polyps were removed with and cold snare. Just polyp (20       mm) was  semi-pedunculated and in the cecum. It was lifted away from the       colonic wall with LW injected submucosally. This was done nicely.       Piecemeal polypectomy ensued. Polyp fragments recovered. Due to the       crater in the location of the cecum this polypectomy site was closed       with 3 hemostasis clips; retrieval were complete. Estimated blood loss       was minimal.      The exam was otherwise without abnormality on direct and retroflexion       views. Impression:               - Diverticulosis in the entire examined colon.                            Redundant colon. Melanosis  coli.                           - (12) 5 to 20 mm polyps in the rectum, at the                            splenic flexure, at the hepatic flexure, in the                            cecum and at the ileocecal valve, removed with a                            hot snare. Resected and retrieved.                           - The examination was otherwise normal on direct                            and retroflexion views. Moderate Sedation:      Moderate (conscious) sedation was personally administered by an       anesthesia professional. The following parameters were monitored: oxygen       saturation, heart rate, blood pressure, respiratory rate, EKG, adequacy       of pulmonary ventilation, and response to care. Recommendation:           - Patient has a contact number available for                            emergencies. The signs and symptoms of potential                            delayed complications were discussed with the                            patient. Return to normal activities tomorrow.                            Written discharge instructions were provided to the  patient.                           - Continue present medications except no aspirin or                            ibuprofen/NSAID type medications x10 days.                           - Repeat colonoscopy date to be determined after                            pending pathology results are reviewed for                            surveillance based on pathology results.                           - Return to GI clinic in 6 months. No future MRI                            until clips proven to be gone. Procedure Code(s):        --- Professional ---                           [phone], Colonoscopy, flexible; with removal of                            tumor(s), polyp(s), or other lesion(s) by snare                            technique Diagnosis Code(s):        --- Professional ---                            Z80.0, Family history of malignant neoplasm of                            digestive organs                           K62.1, Rectal polyp                           D12.3, Benign neoplasm of transverse colon (hepatic                            flexure or splenic flexure)                           D12.0, Benign neoplasm of cecum                           K57.30, Diverticulosis of large intestine without  perforation or abscess without bleeding CPT copyright 2018 American [employer]. All rights reserved. The codes documented in this report are preliminary and upon coder review may  be revised to meet current compliance requirements. [name]. Jendaya Gossett, MD [name], MD 02/15/2018 9:51:51 AM This report has been signed electronically. Number of Addenda: 0

## 2018-02-15 NOTE — Transfer of Care (Signed)
Immediate Anesthesia Transfer of Care Note  Patient: William [name]  Procedure(s) Performed: COLONOSCOPY WITH PROPOFOL (N/A ) POLYPECTOMY  Patient Location: PACU  Anesthesia Type:MAC  Level of Consciousness: awake, alert  and patient cooperative  Airway & Oxygen Therapy: Patient Spontanous Breathing  Post-op Assessment: Report given to RN and Post -op Vital signs reviewed and stable  Post vital signs: Reviewed and stable  Last Vitals:  Vitals Value Taken Time  BP    Temp    Pulse    Resp    SpO2      Last Pain:  Vitals:   02/15/18 0815  TempSrc:   PainSc: 0-No pain      Patients Stated Pain Goal: 8 (37/90/24 0973)  Complications: No apparent anesthesia complications

## 2018-02-15 NOTE — Interval H&P Note (Signed)
History and Physical Interval Note:  02/15/2018 7:31 AM  William [name]  has presented today for surgery, with the diagnosis of fh crc  The various methods of treatment have been discussed with the patient and family. After consideration of risks, benefits and other options for treatment, the patient has consented to  Procedure(s) with comments: COLONOSCOPY WITH PROPOFOL (N/A) - 8:15am as a surgical intervention .  The patient's history has been reviewed, patient examined, no change in status, stable for surgery.  I have reviewed the patient's chart and labs.  Questions were answered to the patient's satisfaction.     Marice Guidone  No change.  High risk screening colonoscopy today per plan.  The risks, benefits, limitations, alternatives and imponderables have been reviewed with the patient. Questions have been answered. All parties are agreeable.

## 2018-02-15 NOTE — Anesthesia Postprocedure Evaluation (Signed)
Anesthesia Post Note  Patient: William [name]  Procedure(s) Performed: COLONOSCOPY WITH PROPOFOL (N/A ) POLYPECTOMY  Patient location during evaluation: PACU Anesthesia Type: MAC Level of consciousness: awake and alert and patient cooperative Pain management: satisfactory to patient Respiratory status: spontaneous breathing Cardiovascular status: stable Postop Assessment: no apparent nausea or vomiting     Last Vitals:  Vitals:   02/15/18 0720  BP: (!) 154/69  Resp: 18  Temp: 36.6 C  SpO2: 96%    Last Pain:  Vitals:   02/15/18 0815  TempSrc:   PainSc: 0-No pain                 Ralf Konopka

## 2018-02-15 NOTE — Anesthesia Procedure Notes (Signed)
Procedure Name: MAC Date/Time: 02/15/2018 8:20 AM Performed by: Vista Deck, CRNA Pre-anesthesia Checklist: Patient identified, Emergency Drugs available, Suction available, Timeout performed and Patient being monitored Patient Re-evaluated:Patient Re-evaluated prior to induction Oxygen Delivery Method: Non-rebreather mask

## 2018-02-15 NOTE — Anesthesia Preprocedure Evaluation (Signed)
Anesthesia Evaluation    Airway Mallampati: II       Dental  (+) Teeth Intact   Pulmonary former smoker,    breath sounds clear to auscultation       Cardiovascular hypertension, + CAD  + dysrhythmias  Rhythm:regular     Neuro/Psych Anxiety    GI/Hepatic GERD  ,  Endo/Other  Type 2  Renal/GU CRF     Musculoskeletal   Abdominal   Peds  Hematology   Anesthesia Other Findings Cardiac stents 5 yrs ago, states now asymptomatic States NPO p MN  Reproductive/Obstetrics                             Anesthesia Physical Anesthesia Plan  ASA: III  Anesthesia Plan: MAC   Post-op Pain Management:    Induction:   PONV Risk Score and Plan:   Airway Management Planned:   Additional Equipment:   Intra-op Plan:   Post-operative Plan:   Informed Consent:   Plan Discussed with: Anesthesiologist  Anesthesia Plan Comments:         Anesthesia Quick Evaluation

## 2018-02-16 ENCOUNTER — Encounter: Payer: Self-pay | Admitting: Internal Medicine

## 2018-02-19 ENCOUNTER — Encounter (HOSPITAL_COMMUNITY): Payer: Self-pay | Admitting: Internal Medicine

## 2018-02-24 ENCOUNTER — Other Ambulatory Visit: Payer: Self-pay | Admitting: Cardiovascular Disease

## 2018-02-25 ENCOUNTER — Other Ambulatory Visit: Payer: Self-pay | Admitting: Cardiovascular Disease

## 2018-03-03 ENCOUNTER — Other Ambulatory Visit: Payer: Self-pay | Admitting: Cardiovascular Disease

## 2018-03-11 DIAGNOSIS — Z6831 Body mass index (BMI) 31.0-31.9, adult: Secondary | ICD-10-CM | POA: Diagnosis not present

## 2018-03-11 DIAGNOSIS — E6609 Other obesity due to excess calories: Secondary | ICD-10-CM | POA: Diagnosis not present

## 2018-03-11 DIAGNOSIS — M150 Primary generalized (osteo)arthritis: Secondary | ICD-10-CM | POA: Diagnosis not present

## 2018-03-11 DIAGNOSIS — Z1389 Encounter for screening for other disorder: Secondary | ICD-10-CM | POA: Diagnosis not present

## 2018-03-11 DIAGNOSIS — G894 Chronic pain syndrome: Secondary | ICD-10-CM | POA: Diagnosis not present

## 2018-04-09 ENCOUNTER — Other Ambulatory Visit: Payer: Self-pay | Admitting: Cardiovascular Disease

## 2018-04-12 DIAGNOSIS — E1129 Type 2 diabetes mellitus with other diabetic kidney complication: Secondary | ICD-10-CM | POA: Diagnosis not present

## 2018-04-12 DIAGNOSIS — M1991 Primary osteoarthritis, unspecified site: Secondary | ICD-10-CM | POA: Diagnosis not present

## 2018-04-12 DIAGNOSIS — E1142 Type 2 diabetes mellitus with diabetic polyneuropathy: Secondary | ICD-10-CM | POA: Diagnosis not present

## 2018-04-12 DIAGNOSIS — Z6831 Body mass index (BMI) 31.0-31.9, adult: Secondary | ICD-10-CM | POA: Diagnosis not present

## 2018-04-16 ENCOUNTER — Other Ambulatory Visit: Payer: Self-pay | Admitting: Cardiovascular Disease

## 2018-05-04 DIAGNOSIS — E1129 Type 2 diabetes mellitus with other diabetic kidney complication: Secondary | ICD-10-CM | POA: Diagnosis not present

## 2018-05-04 DIAGNOSIS — I10 Essential (primary) hypertension: Secondary | ICD-10-CM | POA: Diagnosis not present

## 2018-05-04 DIAGNOSIS — E782 Mixed hyperlipidemia: Secondary | ICD-10-CM | POA: Diagnosis not present

## 2018-06-03 DIAGNOSIS — E1129 Type 2 diabetes mellitus with other diabetic kidney complication: Secondary | ICD-10-CM | POA: Diagnosis not present

## 2018-06-03 DIAGNOSIS — E782 Mixed hyperlipidemia: Secondary | ICD-10-CM | POA: Diagnosis not present

## 2018-06-03 DIAGNOSIS — N183 Chronic kidney disease, stage 3 (moderate): Secondary | ICD-10-CM | POA: Diagnosis not present

## 2018-06-10 DIAGNOSIS — Z6831 Body mass index (BMI) 31.0-31.9, adult: Secondary | ICD-10-CM | POA: Diagnosis not present

## 2018-06-10 DIAGNOSIS — E6609 Other obesity due to excess calories: Secondary | ICD-10-CM | POA: Diagnosis not present

## 2018-06-10 DIAGNOSIS — Z1389 Encounter for screening for other disorder: Secondary | ICD-10-CM | POA: Diagnosis not present

## 2018-06-10 DIAGNOSIS — G894 Chronic pain syndrome: Secondary | ICD-10-CM | POA: Diagnosis not present

## 2018-06-18 ENCOUNTER — Telehealth (INDEPENDENT_AMBULATORY_CARE_PROVIDER_SITE_OTHER): Payer: Medicare Other | Admitting: Cardiovascular Disease

## 2018-06-18 ENCOUNTER — Other Ambulatory Visit: Payer: Self-pay

## 2018-06-18 ENCOUNTER — Encounter: Payer: Self-pay | Admitting: Cardiovascular Disease

## 2018-06-18 VITALS — BP 97/45 | HR 69 | Ht 69.0 in | Wt 228.0 lb

## 2018-06-18 DIAGNOSIS — I25118 Atherosclerotic heart disease of native coronary artery with other forms of angina pectoris: Secondary | ICD-10-CM | POA: Diagnosis not present

## 2018-06-18 DIAGNOSIS — E785 Hyperlipidemia, unspecified: Secondary | ICD-10-CM

## 2018-06-18 DIAGNOSIS — I350 Nonrheumatic aortic (valve) stenosis: Secondary | ICD-10-CM

## 2018-06-18 DIAGNOSIS — I10 Essential (primary) hypertension: Secondary | ICD-10-CM

## 2018-06-18 DIAGNOSIS — Z955 Presence of coronary angioplasty implant and graft: Secondary | ICD-10-CM

## 2018-06-18 DIAGNOSIS — I5032 Chronic diastolic (congestive) heart failure: Secondary | ICD-10-CM

## 2018-06-18 NOTE — Addendum Note (Signed)
Addended by: Barbarann Ehlers A on: 06/18/2018 02:41 PM   Modules accepted: Orders
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
## 2018-06-18 NOTE — Patient Instructions (Signed)
Medication Instructions:.  Your physician recommends that you continue on your current medications as directed. Please refer to the Current Medication list given to you today.   Labwork: none  Procedures/Testing: Your physician has requested that you have an echocardiogram. Echocardiography is a painless test that uses sound waves to create images of your heart. It provides your doctor with information about the size and shape of your heart and how well your heart's chambers and valves are working. This procedure takes approximately one hour. There are no restrictions for this procedure.    Follow-Up: 1 year with Dr.koneswaran  Any Additional Special Instructions Will Be Listed Below (If Applicable).     If you need a refill on your cardiac medications before your next appointment, please call your pharmacy.       Thank you for choosing [facility] !

## 2018-06-24 ENCOUNTER — Ambulatory Visit (HOSPITAL_COMMUNITY)
Admission: RE | Admit: 2018-06-24 | Discharge: 2018-06-24 | Disposition: A | Discharge status: Home / Self Care | Payer: Medicare Other | Source: Ambulatory Visit | Attending: Cardiovascular Disease | Admitting: Cardiovascular Disease

## 2018-06-24 ENCOUNTER — Other Ambulatory Visit: Payer: Self-pay

## 2018-06-24 DIAGNOSIS — I350 Nonrheumatic aortic (valve) stenosis: Secondary | ICD-10-CM | POA: Diagnosis not present

## 2018-06-24 NOTE — Progress Notes (Signed)
*  PRELIMINARY RESULTS* Echocardiogram 2D Echocardiogram has been performed.  Samuel Germany 06/24/2018, 11:36 AM

## 2018-06-28 ENCOUNTER — Telehealth: Payer: Self-pay

## 2018-06-28 DIAGNOSIS — I350 Nonrheumatic aortic (valve) stenosis: Secondary | ICD-10-CM

## 2018-06-28 NOTE — Telephone Encounter (Signed)
Results echo given to pt, order placed for next year f/u

## 2018-06-28 NOTE — Telephone Encounter (Signed)
-----   Message from Herminio Commons, MD sent at 06/28/2018  9:54 AM EDT ----- Cardiac function is normal.  There is significant aortic valve calcification with moderate aortic valve narrowing.  Repeat echocardiogram in 1 year.

## 2018-07-02 ENCOUNTER — Encounter: Payer: Self-pay | Admitting: Internal Medicine

## 2018-07-06 DIAGNOSIS — N183 Chronic kidney disease, stage 3 (moderate): Secondary | ICD-10-CM | POA: Diagnosis not present

## 2018-07-06 DIAGNOSIS — I10 Essential (primary) hypertension: Secondary | ICD-10-CM | POA: Diagnosis not present

## 2018-07-06 DIAGNOSIS — E1129 Type 2 diabetes mellitus with other diabetic kidney complication: Secondary | ICD-10-CM | POA: Diagnosis not present

## 2018-07-12 ENCOUNTER — Other Ambulatory Visit: Payer: Self-pay | Admitting: Cardiovascular Disease

## 2018-07-19 DIAGNOSIS — E6609 Other obesity due to excess calories: Secondary | ICD-10-CM | POA: Diagnosis not present

## 2018-07-19 DIAGNOSIS — K219 Gastro-esophageal reflux disease without esophagitis: Secondary | ICD-10-CM | POA: Diagnosis not present

## 2018-07-19 DIAGNOSIS — I10 Essential (primary) hypertension: Secondary | ICD-10-CM | POA: Diagnosis not present

## 2018-07-19 DIAGNOSIS — Z6831 Body mass index (BMI) 31.0-31.9, adult: Secondary | ICD-10-CM | POA: Diagnosis not present

## 2018-07-19 DIAGNOSIS — E1129 Type 2 diabetes mellitus with other diabetic kidney complication: Secondary | ICD-10-CM | POA: Diagnosis not present

## 2018-08-18 ENCOUNTER — Ambulatory Visit (INDEPENDENT_AMBULATORY_CARE_PROVIDER_SITE_OTHER): Payer: Medicare Other | Admitting: Nurse Practitioner

## 2018-08-18 ENCOUNTER — Other Ambulatory Visit: Payer: Self-pay

## 2018-08-18 ENCOUNTER — Encounter: Payer: Self-pay | Admitting: Nurse Practitioner

## 2018-08-18 VITALS — BP 101/60 | HR 61 | Temp 96.6°F | Ht 69.5 in | Wt 233.4 lb

## 2018-08-18 DIAGNOSIS — Z800 Family history of malignant neoplasm of digestive organs: Secondary | ICD-10-CM | POA: Diagnosis not present

## 2018-08-18 DIAGNOSIS — Z860100 Personal history of colon polyps, unspecified: Secondary | ICD-10-CM

## 2018-08-18 DIAGNOSIS — Z9889 Other specified postprocedural states: Secondary | ICD-10-CM

## 2018-08-18 DIAGNOSIS — I25118 Atherosclerotic heart disease of native coronary artery with other forms of angina pectoris: Secondary | ICD-10-CM

## 2018-08-18 NOTE — Assessment & Plan Note (Signed)
Family history of colon cancer in his brother which makes him high risk for colorectal cancer.  Colonoscopy just recently completed finding multiple polyps.  He is on recall for 3 years.  Generally asymptomatic from a GI standpoint.  Recommend he proceed with recall colonoscopy in 2023.  Follow-up with [place] as needed for any GI complaints.

## 2018-08-18 NOTE — Assessment & Plan Note (Signed)
A total of 12 colon polyps found on colonoscopy.  Some of these required MRI conditional clips.  Patient is aware to have an x-ray completed prior to MRI to ensure clip passage.  Recommended repeat colonoscopy in 3 years based on surgical pathology finding the polyps to be tubular adenoma.  I have confirmed the patient is on recall.  Follow-up as needed.
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
## 2018-08-18 NOTE — Patient Instructions (Signed)
Your health issues we discussed today were:   Family history of colon cancer and personal history of colon polyps: 1. I am glad you are doing well! 2. As we discussed, we found several colon polyps on your last colonoscopy 3. Let us know if you have any rectal bleeding or worsening GI symptoms 4. You will be due for a repeat colonoscopy in 3 years (2023).  Overall I recommend:  1. Continue your other current medications 2. Call us if you have any questions or concerns 3. Return for follow-up as needed for any GI concerns   Because of recent events of COVID-19 ("Coronavirus"), follow CDC recommendations:  Wash your hand frequently Avoid touching your face Stay away from people who are sick If you have symptoms such as fever, cough, shortness of breath then call your healthcare provider for further guidance If you are sick, STAY AT HOME unless otherwise directed by your healthcare provider. Follow directions from state and national officials regarding staying safe   At [facility] we value your feedback. You may receive a survey about your visit today. Please share your experience as we strive to create trusting relationships with our patients to provide genuine, compassionate, quality care.  We appreciate your understanding and patience as we review any laboratory studies, imaging, and other diagnostic tests that are ordered as we care for you. Our office policy is 5 business days for review of these results, and any emergent or urgent results are addressed in a timely manner for your best interest. If you do not hear from our office in 1 week, please contact us.   We also encourage the use of MyChart, which contains your medical information for your review as well. If you are not enrolled in this feature, an access code is on this after visit summary for your convenience. Thank you for allowing [place] to be involved in your care.  It was great to see you today!  I hope you  have a great summer!!

## 2018-08-19 DIAGNOSIS — G894 Chronic pain syndrome: Secondary | ICD-10-CM | POA: Diagnosis not present

## 2018-08-19 DIAGNOSIS — E559 Vitamin D deficiency, unspecified: Secondary | ICD-10-CM | POA: Diagnosis not present

## 2018-08-19 DIAGNOSIS — Z6831 Body mass index (BMI) 31.0-31.9, adult: Secondary | ICD-10-CM | POA: Diagnosis not present

## 2018-08-19 DIAGNOSIS — N429 Disorder of prostate, unspecified: Secondary | ICD-10-CM | POA: Diagnosis not present

## 2018-08-19 DIAGNOSIS — E6609 Other obesity due to excess calories: Secondary | ICD-10-CM | POA: Diagnosis not present

## 2018-09-06 DIAGNOSIS — E1129 Type 2 diabetes mellitus with other diabetic kidney complication: Secondary | ICD-10-CM | POA: Diagnosis not present

## 2018-09-06 DIAGNOSIS — E782 Mixed hyperlipidemia: Secondary | ICD-10-CM | POA: Diagnosis not present

## 2018-09-06 DIAGNOSIS — N183 Chronic kidney disease, stage 3 (moderate): Secondary | ICD-10-CM | POA: Diagnosis not present

## 2018-09-20 DIAGNOSIS — G894 Chronic pain syndrome: Secondary | ICD-10-CM | POA: Diagnosis not present

## 2018-09-20 DIAGNOSIS — Z6831 Body mass index (BMI) 31.0-31.9, adult: Secondary | ICD-10-CM | POA: Diagnosis not present

## 2018-09-20 DIAGNOSIS — E6609 Other obesity due to excess calories: Secondary | ICD-10-CM | POA: Diagnosis not present

## 2018-10-11 ENCOUNTER — Other Ambulatory Visit: Payer: Self-pay

## 2018-10-11 MED ORDER — LISINOPRIL 20 MG PO TABS: 20.0000 mg | ORAL_TABLET | Freq: Every day | ORAL | 1 refills | Status: DC

## 2018-10-19 DIAGNOSIS — K219 Gastro-esophageal reflux disease without esophagitis: Secondary | ICD-10-CM | POA: Diagnosis not present

## 2018-10-19 DIAGNOSIS — E1140 Type 2 diabetes mellitus with diabetic neuropathy, unspecified: Secondary | ICD-10-CM | POA: Diagnosis not present

## 2018-10-19 DIAGNOSIS — Z6831 Body mass index (BMI) 31.0-31.9, adult: Secondary | ICD-10-CM | POA: Diagnosis not present

## 2018-10-19 DIAGNOSIS — R201 Hypoesthesia of skin: Secondary | ICD-10-CM | POA: Diagnosis not present

## 2018-10-19 DIAGNOSIS — Z23 Encounter for immunization: Secondary | ICD-10-CM | POA: Diagnosis not present

## 2018-10-19 DIAGNOSIS — I10 Essential (primary) hypertension: Secondary | ICD-10-CM | POA: Diagnosis not present

## 2018-11-18 DIAGNOSIS — Z6831 Body mass index (BMI) 31.0-31.9, adult: Secondary | ICD-10-CM | POA: Diagnosis not present

## 2018-11-18 DIAGNOSIS — E6609 Other obesity due to excess calories: Secondary | ICD-10-CM | POA: Diagnosis not present

## 2018-11-18 DIAGNOSIS — G894 Chronic pain syndrome: Secondary | ICD-10-CM | POA: Diagnosis not present

## 2018-12-20 DIAGNOSIS — G894 Chronic pain syndrome: Secondary | ICD-10-CM | POA: Diagnosis not present

## 2018-12-20 DIAGNOSIS — Z1389 Encounter for screening for other disorder: Secondary | ICD-10-CM | POA: Diagnosis not present

## 2018-12-20 DIAGNOSIS — Z6831 Body mass index (BMI) 31.0-31.9, adult: Secondary | ICD-10-CM | POA: Diagnosis not present

## 2018-12-20 DIAGNOSIS — E6609 Other obesity due to excess calories: Secondary | ICD-10-CM | POA: Diagnosis not present

## 2018-12-20 DIAGNOSIS — Z0000 Encounter for general adult medical examination without abnormal findings: Secondary | ICD-10-CM | POA: Diagnosis not present

## 2019-01-20 DIAGNOSIS — I10 Essential (primary) hypertension: Secondary | ICD-10-CM | POA: Diagnosis not present

## 2019-01-20 DIAGNOSIS — E6609 Other obesity due to excess calories: Secondary | ICD-10-CM | POA: Diagnosis not present

## 2019-01-20 DIAGNOSIS — G894 Chronic pain syndrome: Secondary | ICD-10-CM | POA: Diagnosis not present

## 2019-01-20 DIAGNOSIS — Z6831 Body mass index (BMI) 31.0-31.9, adult: Secondary | ICD-10-CM | POA: Diagnosis not present

## 2019-01-20 DIAGNOSIS — Z1389 Encounter for screening for other disorder: Secondary | ICD-10-CM | POA: Diagnosis not present

## 2019-01-20 DIAGNOSIS — E119 Type 2 diabetes mellitus without complications: Secondary | ICD-10-CM | POA: Diagnosis not present

## 2019-01-21 DIAGNOSIS — I10 Essential (primary) hypertension: Secondary | ICD-10-CM | POA: Diagnosis not present

## 2019-01-21 DIAGNOSIS — Z1389 Encounter for screening for other disorder: Secondary | ICD-10-CM | POA: Diagnosis not present

## 2019-01-21 DIAGNOSIS — E119 Type 2 diabetes mellitus without complications: Secondary | ICD-10-CM | POA: Diagnosis not present

## 2019-01-21 DIAGNOSIS — E1129 Type 2 diabetes mellitus with other diabetic kidney complication: Secondary | ICD-10-CM | POA: Diagnosis not present

## 2019-02-04 DIAGNOSIS — I10 Essential (primary) hypertension: Secondary | ICD-10-CM | POA: Diagnosis not present

## 2019-02-04 DIAGNOSIS — E785 Hyperlipidemia, unspecified: Secondary | ICD-10-CM | POA: Diagnosis not present

## 2019-02-04 DIAGNOSIS — E1165 Type 2 diabetes mellitus with hyperglycemia: Secondary | ICD-10-CM | POA: Diagnosis not present

## 2019-02-04 DIAGNOSIS — E669 Obesity, unspecified: Secondary | ICD-10-CM | POA: Diagnosis not present

## 2019-02-17 DIAGNOSIS — M150 Primary generalized (osteo)arthritis: Secondary | ICD-10-CM | POA: Diagnosis not present

## 2019-02-17 DIAGNOSIS — Z6830 Body mass index (BMI) 30.0-30.9, adult: Secondary | ICD-10-CM | POA: Diagnosis not present

## 2019-02-17 DIAGNOSIS — M1991 Primary osteoarthritis, unspecified site: Secondary | ICD-10-CM | POA: Diagnosis not present

## 2019-02-17 DIAGNOSIS — G894 Chronic pain syndrome: Secondary | ICD-10-CM | POA: Diagnosis not present

## 2019-03-17 DIAGNOSIS — E6609 Other obesity due to excess calories: Secondary | ICD-10-CM | POA: Diagnosis not present

## 2019-03-17 DIAGNOSIS — G894 Chronic pain syndrome: Secondary | ICD-10-CM | POA: Diagnosis not present

## 2019-03-17 DIAGNOSIS — Z6830 Body mass index (BMI) 30.0-30.9, adult: Secondary | ICD-10-CM | POA: Diagnosis not present

## 2019-03-17 DIAGNOSIS — M150 Primary generalized (osteo)arthritis: Secondary | ICD-10-CM | POA: Diagnosis not present

## 2019-03-21 ENCOUNTER — Other Ambulatory Visit: Payer: Self-pay | Admitting: Nephrology

## 2019-03-21 DIAGNOSIS — N200 Calculus of kidney: Secondary | ICD-10-CM | POA: Diagnosis not present

## 2019-03-21 DIAGNOSIS — E211 Secondary hyperparathyroidism, not elsewhere classified: Secondary | ICD-10-CM | POA: Diagnosis not present

## 2019-03-21 DIAGNOSIS — N1831 Chronic kidney disease, stage 3a: Secondary | ICD-10-CM | POA: Diagnosis not present

## 2019-03-21 DIAGNOSIS — R809 Proteinuria, unspecified: Secondary | ICD-10-CM | POA: Diagnosis not present

## 2019-03-21 DIAGNOSIS — I517 Cardiomegaly: Secondary | ICD-10-CM | POA: Diagnosis not present

## 2019-03-28 ENCOUNTER — Other Ambulatory Visit: Payer: Self-pay

## 2019-03-28 DIAGNOSIS — R809 Proteinuria, unspecified: Secondary | ICD-10-CM | POA: Diagnosis not present

## 2019-03-28 DIAGNOSIS — I517 Cardiomegaly: Secondary | ICD-10-CM | POA: Diagnosis not present

## 2019-03-28 DIAGNOSIS — N200 Calculus of kidney: Secondary | ICD-10-CM | POA: Diagnosis not present

## 2019-03-28 DIAGNOSIS — Z79899 Other long term (current) drug therapy: Secondary | ICD-10-CM | POA: Diagnosis not present

## 2019-03-28 DIAGNOSIS — E211 Secondary hyperparathyroidism, not elsewhere classified: Secondary | ICD-10-CM | POA: Diagnosis not present

## 2019-03-28 DIAGNOSIS — N1831 Chronic kidney disease, stage 3a: Secondary | ICD-10-CM | POA: Diagnosis not present

## 2019-03-28 DIAGNOSIS — D631 Anemia in chronic kidney disease: Secondary | ICD-10-CM | POA: Diagnosis not present

## 2019-03-28 MED ORDER — LISINOPRIL 20 MG PO TABS: 20.0000 mg | ORAL_TABLET | Freq: Every day | ORAL | 1 refills | Status: DC

## 2019-03-28 NOTE — Telephone Encounter (Signed)
Refilled lisinopril 20 mg #90 RF:1

## 2019-03-31 ENCOUNTER — Ambulatory Visit: Payer: Medicare HMO | Attending: Internal Medicine

## 2019-03-31 DIAGNOSIS — Z23 Encounter for immunization: Secondary | ICD-10-CM

## 2019-03-31 NOTE — Progress Notes (Signed)
   Covid-19 Vaccination Clinic  Name:  William [name]    MRN: [id number] DOB: [birth year]  03/31/2019  [name] was observed post Covid-19 immunization for 15 minutes without incident. He was provided with Vaccine Information Sheet and instruction to access the V-Safe system.   [name] was instructed to call 911 with any severe reactions post vaccine: Marland Kitchen Difficulty breathing  . Swelling of face and throat  . A fast heartbeat  . A bad rash all over body  . Dizziness and weakness   Immunizations Administered    Name Date Dose VIS Date Route   Moderna COVID-19 Vaccine 03/31/2019  9:48 AM 0.5 mL 12/07/2018 Intramuscular   Manufacturer: Moderna   Lot: [id number]   [id number]

## 2019-04-05 DIAGNOSIS — E1165 Type 2 diabetes mellitus with hyperglycemia: Secondary | ICD-10-CM | POA: Diagnosis not present

## 2019-04-05 DIAGNOSIS — I10 Essential (primary) hypertension: Secondary | ICD-10-CM | POA: Diagnosis not present

## 2019-04-05 DIAGNOSIS — M150 Primary generalized (osteo)arthritis: Secondary | ICD-10-CM | POA: Diagnosis not present

## 2019-04-11 ENCOUNTER — Ambulatory Visit (HOSPITAL_COMMUNITY)
Admission: RE | Admit: 2019-04-11 | Discharge: 2019-04-11 | Disposition: A | Discharge status: Home / Self Care | Payer: Medicare HMO | Source: Ambulatory Visit | Attending: Nephrology | Admitting: Nephrology

## 2019-04-11 ENCOUNTER — Other Ambulatory Visit: Payer: Self-pay

## 2019-04-11 DIAGNOSIS — N183 Chronic kidney disease, stage 3 unspecified: Secondary | ICD-10-CM | POA: Diagnosis not present

## 2019-04-11 DIAGNOSIS — N1831 Chronic kidney disease, stage 3a: Secondary | ICD-10-CM | POA: Insufficient documentation

## 2019-04-18 DIAGNOSIS — Z6830 Body mass index (BMI) 30.0-30.9, adult: Secondary | ICD-10-CM | POA: Diagnosis not present

## 2019-04-18 DIAGNOSIS — M1991 Primary osteoarthritis, unspecified site: Secondary | ICD-10-CM | POA: Diagnosis not present

## 2019-04-18 DIAGNOSIS — K219 Gastro-esophageal reflux disease without esophagitis: Secondary | ICD-10-CM | POA: Diagnosis not present

## 2019-04-18 DIAGNOSIS — R809 Proteinuria, unspecified: Secondary | ICD-10-CM | POA: Diagnosis not present

## 2019-04-18 DIAGNOSIS — N1831 Chronic kidney disease, stage 3a: Secondary | ICD-10-CM | POA: Diagnosis not present

## 2019-04-18 DIAGNOSIS — G894 Chronic pain syndrome: Secondary | ICD-10-CM | POA: Diagnosis not present

## 2019-04-18 DIAGNOSIS — N200 Calculus of kidney: Secondary | ICD-10-CM | POA: Diagnosis not present

## 2019-04-29 DIAGNOSIS — D508 Other iron deficiency anemias: Secondary | ICD-10-CM | POA: Diagnosis not present

## 2019-04-29 DIAGNOSIS — R809 Proteinuria, unspecified: Secondary | ICD-10-CM | POA: Diagnosis not present

## 2019-04-29 DIAGNOSIS — N1831 Chronic kidney disease, stage 3a: Secondary | ICD-10-CM | POA: Diagnosis not present

## 2019-04-29 DIAGNOSIS — N200 Calculus of kidney: Secondary | ICD-10-CM | POA: Diagnosis not present

## 2019-04-29 DIAGNOSIS — E211 Secondary hyperparathyroidism, not elsewhere classified: Secondary | ICD-10-CM | POA: Diagnosis not present

## 2019-05-02 ENCOUNTER — Encounter: Payer: Self-pay | Admitting: Internal Medicine

## 2019-05-03 ENCOUNTER — Ambulatory Visit: Payer: Medicare HMO | Attending: Internal Medicine

## 2019-05-03 DIAGNOSIS — Z23 Encounter for immunization: Secondary | ICD-10-CM

## 2019-05-03 NOTE — Progress Notes (Signed)
   Covid-19 Vaccination Clinic  Name:  William [name]    MRN: [id number] DOB: Jan 04, 1946  05/03/2019  Mr. William [name] was observed post Covid-19 immunization for 15 minutes without incident. He was provided with Vaccine Information Sheet and instruction to access the V-Safe system.   Mr. William [name] was instructed to call 911 with any severe reactions post vaccine: Marland Kitchen Difficulty breathing  . Swelling of face and throat  . A fast heartbeat  . A bad rash all over body  . Dizziness and weakness   Immunizations Administered    Name Date Dose VIS Date Route   Moderna COVID-19 Vaccine 05/03/2019  8:19 AM 0.5 mL 12/2018 Intramuscular   Manufacturer: Moderna   Lot: [id number]   [id number]

## 2019-05-11 ENCOUNTER — Encounter (HOSPITAL_COMMUNITY)
Admission: RE | Admit: 2019-05-11 | Discharge: 2019-05-11 | Disposition: A | Discharge status: Home / Self Care | Payer: Medicare HMO | Source: Ambulatory Visit | Attending: Nephrology | Admitting: Nephrology

## 2019-05-11 ENCOUNTER — Encounter (HOSPITAL_COMMUNITY): Payer: Self-pay

## 2019-05-11 ENCOUNTER — Other Ambulatory Visit: Payer: Self-pay

## 2019-05-11 DIAGNOSIS — N1831 Chronic kidney disease, stage 3a: Secondary | ICD-10-CM | POA: Diagnosis not present

## 2019-05-11 DIAGNOSIS — D509 Iron deficiency anemia, unspecified: Secondary | ICD-10-CM | POA: Diagnosis not present

## 2019-05-11 MED ORDER — SODIUM CHLORIDE 0.9 % IV SOLN: Freq: Once | INTRAVENOUS | Status: AC

## 2019-05-11 MED ORDER — SODIUM CHLORIDE 0.9 % IV SOLN
510.0000 mg | Freq: Once | INTRAVENOUS | Status: AC
  Administered 2019-05-11: 510 mg via INTRAVENOUS
  Filled 2019-05-11: qty 510

## 2019-05-18 ENCOUNTER — Encounter (HOSPITAL_COMMUNITY): Payer: Self-pay

## 2019-05-18 ENCOUNTER — Other Ambulatory Visit: Payer: Self-pay

## 2019-05-18 ENCOUNTER — Encounter (HOSPITAL_COMMUNITY)
Admission: RE | Admit: 2019-05-18 | Discharge: 2019-05-18 | Disposition: A | Discharge status: Home / Self Care | Payer: Medicare HMO | Source: Ambulatory Visit | Attending: Nephrology | Admitting: Nephrology

## 2019-05-18 DIAGNOSIS — D509 Iron deficiency anemia, unspecified: Secondary | ICD-10-CM | POA: Diagnosis not present

## 2019-05-18 DIAGNOSIS — Z1389 Encounter for screening for other disorder: Secondary | ICD-10-CM | POA: Diagnosis not present

## 2019-05-18 DIAGNOSIS — I10 Essential (primary) hypertension: Secondary | ICD-10-CM | POA: Diagnosis not present

## 2019-05-18 DIAGNOSIS — Z0000 Encounter for general adult medical examination without abnormal findings: Secondary | ICD-10-CM | POA: Diagnosis not present

## 2019-05-18 DIAGNOSIS — G894 Chronic pain syndrome: Secondary | ICD-10-CM | POA: Diagnosis not present

## 2019-05-18 DIAGNOSIS — Z6830 Body mass index (BMI) 30.0-30.9, adult: Secondary | ICD-10-CM | POA: Diagnosis not present

## 2019-05-18 DIAGNOSIS — E1129 Type 2 diabetes mellitus with other diabetic kidney complication: Secondary | ICD-10-CM | POA: Diagnosis not present

## 2019-05-18 DIAGNOSIS — N1831 Chronic kidney disease, stage 3a: Secondary | ICD-10-CM | POA: Diagnosis not present

## 2019-05-18 DIAGNOSIS — K219 Gastro-esophageal reflux disease without esophagitis: Secondary | ICD-10-CM | POA: Diagnosis not present

## 2019-05-18 MED ORDER — SODIUM CHLORIDE 0.9 % IV SOLN
510.0000 mg | Freq: Once | INTRAVENOUS | Status: AC
  Administered 2019-05-18: 510 mg via INTRAVENOUS
  Filled 2019-05-18: qty 510

## 2019-05-18 MED ORDER — SODIUM CHLORIDE 0.9 % IV SOLN: Freq: Once | INTRAVENOUS | Status: AC

## 2019-05-26 ENCOUNTER — Other Ambulatory Visit: Payer: Self-pay

## 2019-05-26 ENCOUNTER — Ambulatory Visit: Payer: Medicare HMO | Admitting: Nurse Practitioner

## 2019-05-26 ENCOUNTER — Encounter: Payer: Self-pay | Admitting: Nurse Practitioner

## 2019-05-26 DIAGNOSIS — D509 Iron deficiency anemia, unspecified: Secondary | ICD-10-CM | POA: Diagnosis not present

## 2019-05-26 NOTE — Patient Instructions (Signed)
Your health issues we discussed today were:   Low iron and anemia: 1. As we discussed, I will discuss with Dr. Gala Romney if it is worth repeating your colonoscopy so soon 2. We may recommend just an upper endoscopy and a possible capsule study to look at your upper GI tract and small intestines 3. However, after we have a discussion we will call you with further recommendations and to schedule 4. Call us if you see any obvious bleeding  Overall I recommend:  1. Continue your other current medications 2. Continue to follow-up with nephrology 3. Return for follow-up in 4 months 4. Call us if you have any questions or concerns   ---------------------------------------------------------------  I am glad you have gotten your COVID-19 vaccination!  Even though you are fully vaccinated you should continue to follow CDC and state/local guidelines.  ---------------------------------------------------------------   At [facility] we value your feedback. You may receive a survey about your visit today. Please share your experience as we strive to create trusting relationships with our patients to provide genuine, compassionate, quality care.  We appreciate your understanding and patience as we review any laboratory studies, imaging, and other diagnostic tests that are ordered as we care for you. Our office policy is 5 business days for review of these results, and any emergent or urgent results are addressed in a timely manner for your best interest. If you do not hear from our office in 1 week, please contact us.   We also encourage the use of MyChart, which contains your medical information for your review as well. If you are not enrolled in this feature, an access code is on this after visit summary for your convenience. Thank you for allowing [place] to be involved in your care.  It was great to see you today!  I hope you have a great Summer!!
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
## 2019-05-26 NOTE — Assessment & Plan Note (Signed)
Referral from [facility] for iron deficiency anemia.  Hemoglobin was 12.5, iron saturation is low at 9%, ferritin low at 9.  The patient has since undergone iron transfusions.  He was referred to GI due to EMR data indicating next due for colonoscopy in 2023.  However, his colonoscopy was completed about 1 year ago.  At this point I do not know if it is utilitarian to repeat his colonoscopy so soon.  I will discuss further with Dr. Gala Romney.  More likely appropriate would be an upper endoscopy with a possible capsule evaluation given his history of some amount of GERD currently on a PPI.  After discussing with the endoscopist will make further recommendations.  Recommend he continue to follow-up with nephrology and we will see him back in the office in 4 months.

## 2019-06-01 DIAGNOSIS — I6523 Occlusion and stenosis of bilateral carotid arteries: Secondary | ICD-10-CM | POA: Diagnosis not present

## 2019-06-23 ENCOUNTER — Other Ambulatory Visit: Payer: Self-pay | Admitting: Cardiovascular Disease
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
## 2019-06-27 ENCOUNTER — Other Ambulatory Visit: Payer: Self-pay

## 2019-06-27 ENCOUNTER — Encounter: Payer: Self-pay | Admitting: Family Medicine

## 2019-06-27 ENCOUNTER — Ambulatory Visit (INDEPENDENT_AMBULATORY_CARE_PROVIDER_SITE_OTHER): Payer: Medicare HMO | Admitting: Family Medicine

## 2019-06-27 VITALS — BP 118/60 | HR 64 | Ht 69.0 in | Wt 226.2 lb

## 2019-06-27 DIAGNOSIS — I350 Nonrheumatic aortic (valve) stenosis: Secondary | ICD-10-CM

## 2019-06-27 DIAGNOSIS — I2510 Atherosclerotic heart disease of native coronary artery without angina pectoris: Secondary | ICD-10-CM

## 2019-06-27 DIAGNOSIS — I5032 Chronic diastolic (congestive) heart failure: Secondary | ICD-10-CM | POA: Diagnosis not present

## 2019-06-27 DIAGNOSIS — I10 Essential (primary) hypertension: Secondary | ICD-10-CM | POA: Diagnosis not present

## 2019-06-27 DIAGNOSIS — E785 Hyperlipidemia, unspecified: Secondary | ICD-10-CM | POA: Diagnosis not present

## 2019-06-27 NOTE — Patient Instructions (Signed)
Medication Instructions:  Continue all current medications.  Labwork: none  Testing/Procedures: Your physician has requested that you have an echocardiogram. Echocardiography is a painless test that uses sound waves to create images of your heart. It provides your doctor with information about the size and shape of your heart and how well your heart's chambers and valves are working. This procedure takes approximately one hour. There are no restrictions for this procedure - to be done just prior to next office visit.   Follow-Up: Your physician wants you to follow up in:  1 year.  You will receive a reminder letter in the mail one-two months in advance.  If you don't receive a letter, please call our office to schedule the follow up appointment.    Any Other Special Instructions Will Be Listed Below (If Applicable).  If you need a refill on your cardiac medications before your next appointment, please call your pharmacy.

## 2019-07-06 DIAGNOSIS — E1165 Type 2 diabetes mellitus with hyperglycemia: Secondary | ICD-10-CM | POA: Diagnosis not present

## 2019-07-06 DIAGNOSIS — I2510 Atherosclerotic heart disease of native coronary artery without angina pectoris: Secondary | ICD-10-CM | POA: Diagnosis not present

## 2019-07-06 DIAGNOSIS — I10 Essential (primary) hypertension: Secondary | ICD-10-CM | POA: Diagnosis not present

## 2019-07-06 DIAGNOSIS — M1991 Primary osteoarthritis, unspecified site: Secondary | ICD-10-CM | POA: Diagnosis not present

## 2019-07-19 DIAGNOSIS — G894 Chronic pain syndrome: Secondary | ICD-10-CM | POA: Diagnosis not present

## 2019-07-19 DIAGNOSIS — E6609 Other obesity due to excess calories: Secondary | ICD-10-CM | POA: Diagnosis not present

## 2019-07-19 DIAGNOSIS — Z6831 Body mass index (BMI) 31.0-31.9, adult: Secondary | ICD-10-CM | POA: Diagnosis not present

## 2019-07-19 DIAGNOSIS — M150 Primary generalized (osteo)arthritis: Secondary | ICD-10-CM | POA: Diagnosis not present

## 2019-07-21 DIAGNOSIS — N200 Calculus of kidney: Secondary | ICD-10-CM | POA: Diagnosis not present

## 2019-07-21 DIAGNOSIS — D508 Other iron deficiency anemias: Secondary | ICD-10-CM | POA: Diagnosis not present

## 2019-07-21 DIAGNOSIS — R809 Proteinuria, unspecified: Secondary | ICD-10-CM | POA: Diagnosis not present

## 2019-07-21 DIAGNOSIS — E211 Secondary hyperparathyroidism, not elsewhere classified: Secondary | ICD-10-CM | POA: Diagnosis not present

## 2019-07-21 DIAGNOSIS — N1831 Chronic kidney disease, stage 3a: Secondary | ICD-10-CM | POA: Diagnosis not present

## 2019-07-26 ENCOUNTER — Other Ambulatory Visit: Payer: Self-pay | Admitting: Student

## 2019-07-26 MED ORDER — METOPROLOL TARTRATE 25 MG PO TABS: 25.0000 mg | ORAL_TABLET | Freq: Every day | ORAL | 3 refills | Status: DC

## 2019-07-29 DIAGNOSIS — R809 Proteinuria, unspecified: Secondary | ICD-10-CM | POA: Diagnosis not present

## 2019-07-29 DIAGNOSIS — I129 Hypertensive chronic kidney disease with stage 1 through stage 4 chronic kidney disease, or unspecified chronic kidney disease: Secondary | ICD-10-CM | POA: Diagnosis not present

## 2019-07-29 DIAGNOSIS — E559 Vitamin D deficiency, unspecified: Secondary | ICD-10-CM | POA: Diagnosis not present

## 2019-07-29 DIAGNOSIS — N200 Calculus of kidney: Secondary | ICD-10-CM | POA: Diagnosis not present

## 2019-07-29 DIAGNOSIS — N1831 Chronic kidney disease, stage 3a: Secondary | ICD-10-CM | POA: Diagnosis not present

## 2019-08-08 ENCOUNTER — Encounter: Payer: Self-pay | Admitting: Internal Medicine

## 2019-08-18 DIAGNOSIS — E6609 Other obesity due to excess calories: Secondary | ICD-10-CM | POA: Diagnosis not present

## 2019-08-18 DIAGNOSIS — G894 Chronic pain syndrome: Secondary | ICD-10-CM | POA: Diagnosis not present

## 2019-08-18 DIAGNOSIS — Z6830 Body mass index (BMI) 30.0-30.9, adult: Secondary | ICD-10-CM | POA: Diagnosis not present

## 2019-09-19 DIAGNOSIS — G894 Chronic pain syndrome: Secondary | ICD-10-CM | POA: Diagnosis not present

## 2019-09-19 DIAGNOSIS — E6609 Other obesity due to excess calories: Secondary | ICD-10-CM | POA: Diagnosis not present

## 2019-09-19 DIAGNOSIS — Z6831 Body mass index (BMI) 31.0-31.9, adult: Secondary | ICD-10-CM | POA: Diagnosis not present

## 2019-09-27 ENCOUNTER — Ambulatory Visit: Payer: Medicare HMO | Admitting: Nurse Practitioner

## 2019-09-29 DIAGNOSIS — R809 Proteinuria, unspecified: Secondary | ICD-10-CM | POA: Diagnosis not present

## 2019-09-29 DIAGNOSIS — N1831 Chronic kidney disease, stage 3a: Secondary | ICD-10-CM | POA: Diagnosis not present

## 2019-09-29 DIAGNOSIS — I129 Hypertensive chronic kidney disease with stage 1 through stage 4 chronic kidney disease, or unspecified chronic kidney disease: Secondary | ICD-10-CM | POA: Diagnosis not present

## 2019-09-29 DIAGNOSIS — N200 Calculus of kidney: Secondary | ICD-10-CM | POA: Diagnosis not present

## 2019-10-04 ENCOUNTER — Other Ambulatory Visit: Payer: Self-pay

## 2019-10-04 ENCOUNTER — Ambulatory Visit: Payer: Medicare HMO | Admitting: Nurse Practitioner

## 2019-10-04 ENCOUNTER — Encounter: Payer: Self-pay | Admitting: Nurse Practitioner

## 2019-10-04 ENCOUNTER — Telehealth: Payer: Self-pay | Admitting: *Deleted

## 2019-10-04 VITALS — BP 127/71 | HR 63 | Temp 97.5°F | Ht 69.0 in | Wt 231.0 lb

## 2019-10-04 DIAGNOSIS — D509 Iron deficiency anemia, unspecified: Secondary | ICD-10-CM

## 2019-10-04 DIAGNOSIS — Z800 Family history of malignant neoplasm of digestive organs: Secondary | ICD-10-CM

## 2019-10-04 NOTE — Telephone Encounter (Signed)
PA TCS/EGD approved via [place]. Auth# [id number] dates 11/03/2019-12/03/2019

## 2019-10-04 NOTE — Patient Instructions (Signed)
Your health issues we discussed today were:   Anemia and family history of colon cancer: 1. As we discussed, we will schedule a colonoscopy and upper endoscopy for you 2. Further recommendations will follow your procedures 3. Let us know if you see any obvious bleeding 4. Continue to follow with nephrology related to your anemia  Overall I recommend:  1. Continue other current medications 2. Return for follow-up in 3 to 4 months 3. Call us for any questions or concerns   ---------------------------------------------------------------  I am glad you have gotten your COVID-19 vaccination!  Even though you are fully vaccinated you should continue to follow CDC and state/local guidelines.  ---------------------------------------------------------------   At [facility] we value your feedback. You may receive a survey about your visit today. Please share your experience as we strive to create trusting relationships with our patients to provide genuine, compassionate, quality care.  We appreciate your understanding and patience as we review any laboratory studies, imaging, and other diagnostic tests that are ordered as we care for you. Our office policy is 5 business days for review of these results, and any emergent or urgent results are addressed in a timely manner for your best interest. If you do not hear from our office in 1 week, please contact us.   We also encourage the use of MyChart, which contains your medical information for your review as well. If you are not enrolled in this feature, an access code is on this after visit summary for your convenience. Thank you for allowing [place] to be involved in your care.  It was great to see you today!  I hope you have a great Fall!!
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
## 2019-10-04 NOTE — Telephone Encounter (Addendum)
lmovm to schedule to TCS/EGD with propofol with Dr, Gala Romney, asa 2

## 2019-10-04 NOTE — Telephone Encounter (Signed)
Patient returned call. He has been scheduled for 10/28 at 8:15am. Aware will mail prep instructions with covid test appt.
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
## 2019-10-06 DIAGNOSIS — E559 Vitamin D deficiency, unspecified: Secondary | ICD-10-CM | POA: Diagnosis not present

## 2019-10-06 DIAGNOSIS — N1831 Chronic kidney disease, stage 3a: Secondary | ICD-10-CM | POA: Diagnosis not present

## 2019-10-06 DIAGNOSIS — I129 Hypertensive chronic kidney disease with stage 1 through stage 4 chronic kidney disease, or unspecified chronic kidney disease: Secondary | ICD-10-CM | POA: Diagnosis not present

## 2019-10-06 DIAGNOSIS — R809 Proteinuria, unspecified: Secondary | ICD-10-CM | POA: Diagnosis not present

## 2019-10-06 DIAGNOSIS — E211 Secondary hyperparathyroidism, not elsewhere classified: Secondary | ICD-10-CM | POA: Diagnosis not present

## 2019-10-19 DIAGNOSIS — G894 Chronic pain syndrome: Secondary | ICD-10-CM | POA: Diagnosis not present

## 2019-10-19 DIAGNOSIS — Z6831 Body mass index (BMI) 31.0-31.9, adult: Secondary | ICD-10-CM | POA: Diagnosis not present

## 2019-10-19 DIAGNOSIS — E6609 Other obesity due to excess calories: Secondary | ICD-10-CM | POA: Diagnosis not present

## 2019-10-19 DIAGNOSIS — M1991 Primary osteoarthritis, unspecified site: Secondary | ICD-10-CM | POA: Diagnosis not present

## 2019-11-02 ENCOUNTER — Other Ambulatory Visit: Payer: Self-pay

## 2019-11-02 ENCOUNTER — Other Ambulatory Visit (HOSPITAL_COMMUNITY)
Admission: RE | Admit: 2019-11-02 | Discharge: 2019-11-02 | Disposition: A | Discharge status: Home / Self Care | Payer: Medicare HMO | Source: Ambulatory Visit | Attending: Internal Medicine | Admitting: Internal Medicine

## 2019-11-02 DIAGNOSIS — Z20822 Contact with and (suspected) exposure to covid-19: Secondary | ICD-10-CM | POA: Diagnosis not present

## 2019-11-02 DIAGNOSIS — Z01812 Encounter for preprocedural laboratory examination: Secondary | ICD-10-CM | POA: Diagnosis not present

## 2019-11-02 LAB — BASIC METABOLIC PANEL
Anion gap: 8 (ref 5–15)
BUN: 21 mg/dL (ref 8–23)
CO2: 28 mmol/L (ref 22–32)
Calcium: 10.5 mg/dL — ABNORMAL HIGH (ref 8.9–10.3)
Chloride: 101 mmol/L (ref 98–111)
Creatinine, Ser: 1.27 mg/dL — ABNORMAL HIGH (ref 0.61–1.24)
GFR, Estimated: 60 mL/min — ABNORMAL LOW (ref >60)
Glucose, Bld: 115 mg/dL — ABNORMAL HIGH (ref 70–99)
Potassium: 4.1 mmol/L (ref 3.5–5.1)
Sodium: 137 mmol/L (ref 135–145)

## 2019-11-02 LAB — SARS CORONAVIRUS 2 (TAT 6-24 HRS): SARS Coronavirus 2: NEGATIVE

## 2019-11-03 ENCOUNTER — Encounter (HOSPITAL_COMMUNITY)
Admission: RE | Disposition: A | Discharge status: Home / Self Care | Payer: Self-pay | Source: Home / Self Care | Attending: Internal Medicine

## 2019-11-03 ENCOUNTER — Encounter (HOSPITAL_COMMUNITY): Payer: Self-pay | Admitting: Internal Medicine

## 2019-11-03 ENCOUNTER — Other Ambulatory Visit: Payer: Self-pay

## 2019-11-03 ENCOUNTER — Ambulatory Visit (HOSPITAL_COMMUNITY): Payer: Medicare HMO | Admitting: Anesthesiology

## 2019-11-03 ENCOUNTER — Ambulatory Visit (HOSPITAL_COMMUNITY)
Admission: RE | Admit: 2019-11-03 | Discharge: 2019-11-03 | Disposition: A | Discharge status: Home / Self Care | Payer: Medicare HMO | Attending: Internal Medicine | Admitting: Internal Medicine

## 2019-11-03 DIAGNOSIS — E782 Mixed hyperlipidemia: Secondary | ICD-10-CM | POA: Diagnosis not present

## 2019-11-03 DIAGNOSIS — I2510 Atherosclerotic heart disease of native coronary artery without angina pectoris: Secondary | ICD-10-CM | POA: Diagnosis not present

## 2019-11-03 DIAGNOSIS — E1129 Type 2 diabetes mellitus with other diabetic kidney complication: Secondary | ICD-10-CM | POA: Diagnosis not present

## 2019-11-03 DIAGNOSIS — K259 Gastric ulcer, unspecified as acute or chronic, without hemorrhage or perforation: Secondary | ICD-10-CM | POA: Diagnosis not present

## 2019-11-03 DIAGNOSIS — K449 Diaphragmatic hernia without obstruction or gangrene: Secondary | ICD-10-CM | POA: Insufficient documentation

## 2019-11-03 DIAGNOSIS — E119 Type 2 diabetes mellitus without complications: Secondary | ICD-10-CM | POA: Diagnosis not present

## 2019-11-03 DIAGNOSIS — Z5309 Procedure and treatment not carried out because of other contraindication: Secondary | ICD-10-CM | POA: Diagnosis not present

## 2019-11-03 DIAGNOSIS — K3189 Other diseases of stomach and duodenum: Secondary | ICD-10-CM | POA: Diagnosis not present

## 2019-11-03 DIAGNOSIS — K2970 Gastritis, unspecified, without bleeding: Secondary | ICD-10-CM | POA: Diagnosis not present

## 2019-11-03 DIAGNOSIS — D509 Iron deficiency anemia, unspecified: Secondary | ICD-10-CM | POA: Insufficient documentation

## 2019-11-03 DIAGNOSIS — I10 Essential (primary) hypertension: Secondary | ICD-10-CM | POA: Diagnosis not present

## 2019-11-03 HISTORY — PX: ESOPHAGOGASTRODUODENOSCOPY (EGD) WITH PROPOFOL: SHX5813

## 2019-11-03 HISTORY — PX: BIOPSY: SHX5522

## 2019-11-03 HISTORY — PX: FLEXIBLE SIGMOIDOSCOPY: SHX5431

## 2019-11-03 LAB — GLUCOSE, CAPILLARY: Glucose-Capillary: 130 mg/dL — ABNORMAL HIGH (ref 70–99)

## 2019-11-03 SURGERY — ESOPHAGOGASTRODUODENOSCOPY (EGD) WITH PROPOFOL
Anesthesia: General

## 2019-11-03 MED ORDER — LIDOCAINE VISCOUS HCL 2 % MT SOLN
OROMUCOSAL | Status: AC
  Filled 2019-11-03: qty 15

## 2019-11-03 MED ORDER — HYDROMORPHONE HCL 1 MG/ML IJ SOLN: 0.2500 mg | INTRAMUSCULAR | Status: DC | PRN

## 2019-11-03 MED ORDER — STERILE WATER FOR IRRIGATION IR SOLN
Status: DC | PRN
  Administered 2019-11-03: 200 mL

## 2019-11-03 MED ORDER — PROPOFOL 500 MG/50ML IV EMUL
INTRAVENOUS | Status: DC | PRN
  Administered 2019-11-03: 100 ug/kg/min via INTRAVENOUS

## 2019-11-03 MED ORDER — PHENYLEPHRINE 40 MCG/ML (10ML) SYRINGE FOR IV PUSH (FOR BLOOD PRESSURE SUPPORT)
PREFILLED_SYRINGE | INTRAVENOUS | Status: DC | PRN
  Administered 2019-11-03: 80 ug via INTRAVENOUS

## 2019-11-03 MED ORDER — LIDOCAINE HCL (CARDIAC) PF 100 MG/5ML IV SOSY
PREFILLED_SYRINGE | INTRAVENOUS | Status: DC | PRN
  Administered 2019-11-03: 100 mg via INTRATRACHEAL

## 2019-11-03 MED ORDER — ACETAMINOPHEN 10 MG/ML IV SOLN: 1000.0000 mg | Freq: Once | INTRAVENOUS | Status: DC | PRN

## 2019-11-03 MED ORDER — DROPERIDOL 2.5 MG/ML IJ SOLN: 0.6250 mg | Freq: Once | INTRAMUSCULAR | Status: DC | PRN

## 2019-11-03 MED ORDER — PROPOFOL 10 MG/ML IV BOLUS
INTRAVENOUS | Status: DC | PRN
  Administered 2019-11-03: 20 mg via INTRAVENOUS
  Administered 2019-11-03: 70 mg via INTRAVENOUS

## 2019-11-03 MED ORDER — MEPERIDINE HCL 50 MG/ML IJ SOLN: 6.2500 mg | INTRAMUSCULAR | Status: DC | PRN

## 2019-11-03 MED ORDER — OXYCODONE HCL 5 MG/5ML PO SOLN: 5.0000 mg | Freq: Once | ORAL | Status: DC | PRN

## 2019-11-03 MED ORDER — GLYCOPYRROLATE 0.2 MG/ML IJ SOLN
INTRAMUSCULAR | Status: AC
  Filled 2019-11-03: qty 1

## 2019-11-03 MED ORDER — ONDANSETRON HCL 4 MG/2ML IJ SOLN: 4.0000 mg | Freq: Once | INTRAMUSCULAR | Status: DC | PRN

## 2019-11-03 MED ORDER — OXYCODONE HCL 5 MG PO TABS: 5.0000 mg | ORAL_TABLET | Freq: Once | ORAL | Status: DC | PRN

## 2019-11-03 MED ORDER — LACTATED RINGERS IV SOLN: INTRAVENOUS | Status: DC

## 2019-11-03 NOTE — Op Note (Signed)
[facility] Patient Name: William [name] Procedure Date: 11/03/2019 8:15 AM MRN: [id number] Date of Birth: [birth year] Attending MD: [name] , MD CSN: [id number] Age: 74 Admit Type: Outpatient Procedure:                Colonoscopy Indications:              Iron deficiency anemia Providers:                [name], MD, Lambert Mody,                            Crystal Page, [place]ett[place] Risa Grill, Technician,                            Nelma Rothman, Merchant navy officer Referring MD:              Medicines:                Propofol per Anesthesia Complications:            No immediate complications. Estimated Blood Loss:     Estimated blood loss: none. Procedure:                Pre-Anesthesia Assessment:                           - Prior to the procedure, a History and Physical                            was performed, and patient medications and                            allergies were reviewed. The patient's tolerance of                            previous anesthesia was also reviewed. The risks                            and benefits of the procedure and the sedation                            options and risks were discussed with the patient.                            All questions were answered, and informed consent                            was obtained. Prior Anticoagulants: The patient has                            taken no previous anticoagulant or antiplatelet                            agents. ASA Grade Assessment: II - A patient with  mild systemic disease. After reviewing the risks                            and benefits, the patient was deemed in                            satisfactory condition to undergo the procedure.                           After obtaining informed consent, the colonoscope                            was passed under direct vision. Throughout the                            procedure, the patient's blood  pressure, pulse, and                            oxygen saturations were monitored continuously. The                            CF-[id number] ([id number]) scope was introduced through                            the anus with the intention of advancing to the                            cecum. The scope was advanced to the rectum before                            the procedure was aborted. Medications were given.                            The colonoscopy was aborted due to inadequate bowel                            prep. Scope In: 8:19:13 AM Scope Out: 8:20:35 AM Total Procedure Duration: 0 hours 1 minute 22 seconds  Findings:      The perianal and digital rectal examinations were normal. Formed stool       in the rectum which precluded completion of examination. Scope withdrawn. Impression:               Inadequate prep; colonoscopy aborted Moderate Sedation:      Moderate (conscious) sedation was personally administered by an       anesthesia professional. The following parameters were monitored: oxygen       saturation, heart rate, blood pressure, respiratory rate, EKG, adequacy       of pulmonary ventilation, and response to care. Recommendation:           Return to the office to re-schedule Procedure Code(s):        --- Professional ---                           [phone], 53, Colonoscopy, flexible; diagnostic,  including collection of specimen(s) by brushing or                            washing, when performed (separate procedure) Diagnosis Code(s):        --- Professional ---                           D50.9, Iron deficiency anemia, unspecified CPT copyright 2019 American Medical Association. All rights reserved. The codes documented in this report are preliminary and upon coder review may  be revised to meet current compliance requirements. [name]. Kolbee Bogusz, MD [name], MD 11/03/2019 8:51:12 AM This report has been signed electronically. Number of  Addenda: 0

## 2019-11-03 NOTE — Op Note (Signed)
[facility] Patient Name: William [name] Procedure Date: 11/03/2019 7:59 AM MRN: [id number] Date of Birth: 1945-10-07 Attending MD: [name] , MD CSN: [id number] Age: 74 Admit Type: Outpatient Procedure:                Upper GI endoscopy Indications:              Iron deficiency anemia Providers:                [name], MD, Lambert Mody,                            Crystal Page, [place] Risa Grill, Technician,                            Nelma Rothman, Merchant [employer] officer Referring MD:              Medicines:                Propofol per Anesthesia Complications:            No immediate complications. Estimated Blood Loss:     Estimated blood loss was minimal. Procedure:                Pre-Anesthesia Assessment:                           - Prior to the procedure, a History and Physical                            was performed, and patient medications and                            allergies were reviewed. The patient's tolerance of                            previous anesthesia was also reviewed. The risks                            and benefits of the procedure and the sedation                            options and risks were discussed with the patient.                            All questions were answered, and informed consent                            was obtained. Prior Anticoagulants: The patient has                            taken no previous anticoagulant or antiplatelet                            agents. ASA Grade Assessment: II - A patient with  mild systemic disease. After reviewing the risks                            and benefits, the patient was deemed in                            satisfactory condition to undergo the procedure.                           After obtaining informed consent, the endoscope was                            passed under direct vision. Throughout the                            procedure, the  patient's blood pressure, pulse, and                            oxygen saturations were monitored continuously. The                            [id number]-[id number] ([id number]) scope was introduced through the                            mouth, and advanced to the second part of duodenum.                            The upper GI endoscopy was accomplished without                            difficulty. The patient tolerated the procedure                            well. Scope In: 8:09:18 AM Scope Out: 8:13:05 AM Total Procedure Duration: 0 hours 3 minutes 47 seconds  Findings:      The examined esophagus was normal.      A small hiatal hernia was present.      Diffuse erythematous mucosa was found in the entire examined stomach.       Scattered erosions on the anterior gastric wall. No ulcer or       infiltrating process appreciated. Patent pylorus.      The duodenal bulb and second portion of the duodenum were normal.       Finally, abnormal gastric mucosa was biopsied with a cold forceps for       histology. Estimated blood loss was minimal. biopsies were taken with a       cold forceps for histology. Impression:               - Normal esophagus.                           - Small hiatal hernia.                           - Erythematous /eroded mucosa in the stomach.  Biopsied.                           - Normal duodenal bulb and second portion of the                            duodenum. Moderate Sedation:      Moderate (conscious) sedation was personally administered by an       anesthesia professional. The following parameters were monitored: oxygen       saturation, heart rate, blood pressure, respiratory rate, EKG, adequacy       of pulmonary ventilation, and response to care. Recommendation:           - Patient has a contact number available for                            emergencies. The signs and symptoms of potential                            delayed complications  were discussed with the                            patient. Return to normal activities tomorrow.                            Written discharge instructions were provided to the                            patient.                           - Advance diet as tolerated. Follow-up on                            pathology. See colonoscopy report. Procedure Code(s):        --- Professional ---                           [phone], Esophagogastroduodenoscopy, flexible,                            transoral; with biopsy, single or multiple Diagnosis Code(s):        --- Professional ---                           K44.9, Diaphragmatic hernia without obstruction or                            gangrene                           K31.89, Other diseases of stomach and duodenum                           D50.9, Iron deficiency anemia, unspecified CPT copyright 2019 American Medical Association. All rights reserved. The codes documented in this report are preliminary and upon coder review may  be revised to meet current compliance requirements. [name]. [name], MD [name], MD 11/03/2019 8:46:36 AM This report has been signed electronically. Number of Addenda: 0

## 2019-11-03 NOTE — Anesthesia Postprocedure Evaluation (Signed)
Anesthesia Post Note  Patient: William [name]  Procedure(s) Performed: ESOPHAGOGASTRODUODENOSCOPY (EGD) WITH PROPOFOL (N/A ) BIOPSY FLEXIBLE SIGMOIDOSCOPY (N/A )  Patient location during evaluation: PACU Anesthesia Type: General Level of consciousness: oriented and awake and alert Pain management: pain level controlled Vital Signs Assessment: post-procedure vital signs reviewed and stable Respiratory status: spontaneous breathing Cardiovascular status: blood pressure returned to baseline Postop Assessment: no apparent nausea or vomiting Anesthetic complications: no   No complications documented.   Last Vitals:  Vitals:   11/03/19 0738  BP: 137/68  Pulse: 75  Resp: 14  Temp: 36.7 C  SpO2: 97%    Last Pain:  Vitals:   11/03/19 0802  TempSrc:   PainSc: 0-No pain                 Karna Dupes

## 2019-11-03 NOTE — Transfer of Care (Signed)
Immediate Anesthesia Transfer of Care Note  Patient: William [name]  Procedure(s) Performed: ESOPHAGOGASTRODUODENOSCOPY (EGD) WITH PROPOFOL (N/A ) BIOPSY FLEXIBLE SIGMOIDOSCOPY (N/A )  Patient Location: PACU  Anesthesia Type:General  Level of Consciousness: awake  Airway & Oxygen Therapy: Patient Spontanous Breathing  Post-op Assessment: Report given to RN, Post -op Vital signs reviewed and stable and Patient moving all extremities  Post vital signs: Reviewed and stable  Last Vitals:  Vitals Value Taken Time  BP    Temp    Pulse    Resp    SpO2      Last Pain:  Vitals:   11/03/19 0802  TempSrc:   PainSc: 0-No pain      Patients Stated Pain Goal: 7 (18/28/83 3744)  Complications: No complications documented.

## 2019-11-03 NOTE — Discharge Instructions (Signed)
Colonoscopy Discharge Instructions  Read the instructions outlined below and refer to this sheet in the next few weeks. These discharge instructions provide you with general information on caring for yourself after you leave the hospital. Your doctor may also give you specific instructions. While your treatment has been planned according to the most current medical practices available, unavoidable complications occasionally occur. If you have any problems or questions after discharge, call Dr. Gala Romney at [phone]. ACTIVITY  You may resume your regular activity, but move at a slower pace for the next 24 hours.   Take frequent rest periods for the next 24 hours.   Walking will help get rid of the air and reduce the bloated feeling in your belly (abdomen).   No driving for 24 hours (because of the medicine (anesthesia) used during the test).    Do not sign any important legal documents or operate any machinery for 24 hours (because of the anesthesia used during the test).  NUTRITION  Drink plenty of fluids.   You may resume your normal diet as instructed by your doctor.   Begin with a light meal and progress to your normal diet. Heavy or fried foods are harder to digest and may make you feel sick to your stomach (nauseated).   Avoid alcoholic beverages for 24 hours or as instructed.  MEDICATIONS  You may resume your normal medications unless your doctor tells you otherwise.  WHAT YOU CAN EXPECT TODAY  Some feelings of bloating in the abdomen.   Passage of more gas than usual.   Spotting of blood in your stool or on the toilet paper.  IF YOU HAD POLYPS REMOVED DURING THE COLONOSCOPY:  No aspirin products for 7 days or as instructed.   No alcohol for 7 days or as instructed.   Eat a soft diet for the next 24 hours.  FINDING OUT THE RESULTS OF YOUR TEST Not all test results are available during your visit. If your test results are not back during the visit, make an appointment  with your caregiver to find out the results. Do not assume everything is normal if you have not heard from your caregiver or the medical facility. It is important for you to follow up on all of your test results.  SEEK IMMEDIATE MEDICAL ATTENTION IF:  You have more than a spotting of blood in your stool.   Your belly is swollen (abdominal distention).   You are nauseated or vomiting.   You have a temperature over 101.   You have abdominal pain or discomfort that is severe or gets worse throughout the day.    EGD Discharge instructions Please read the instructions outlined below and refer to this sheet in the next few weeks. These discharge instructions provide you with general information on caring for yourself after you leave the hospital. Your doctor may also give you specific instructions. While your treatment has been planned according to the most current medical practices available, unavoidable complications occasionally occur. If you have any problems or questions after discharge, please call your doctor. ACTIVITY  You may resume your regular activity but move at a slower pace for the next 24 hours.   Take frequent rest periods for the next 24 hours.   Walking will help expel (get rid of) the air and reduce the bloated feeling in your abdomen.   No driving for 24 hours (because of the anesthesia (medicine) used during the test).   You may shower.   Do not sign  any important legal documents or operate any machinery for 24 hours (because of the anesthesia used during the test).  NUTRITION  Drink plenty of fluids.   You may resume your normal diet.   Begin with a light meal and progress to your normal diet.   Avoid alcoholic beverages for 24 hours or as instructed by your caregiver.  MEDICATIONS  You may resume your normal medications unless your caregiver tells you otherwise.  WHAT YOU CAN EXPECT TODAY  You may experience abdominal discomfort such as a feeling of  fullness or "gas" pains.  FOLLOW-UP  Your doctor will discuss the results of your test with you.  SEEK IMMEDIATE MEDICAL ATTENTION IF ANY OF THE FOLLOWING OCCUR:  Excessive nausea (feeling sick to your stomach) and/or vomiting.   Severe abdominal pain and distention (swelling).   Trouble swallowing.   Temperature over 101 F (37.8 C).   Rectal bleeding or vomiting of blood.    Stomach mildly inflamed.  Biopsies taken.  I attempted to do a colonoscopy but you were not prepped adequately to complete the examination.  Further recommendations to follow pending review of pathology report  Office visit with [place] in 1 month [place] [facility] to regroup and reschedule a colonoscopy  At patient request I called Lavonda Jumbo [phone] results and recommendations

## 2019-11-03 NOTE — Interval H&P Note (Signed)
History and Physical Interval Note:  11/03/2019 7:58 AM  William [name]  has presented today for surgery, with the diagnosis of IDA, FH CRC.  The various methods of treatment have been discussed with the patient and family. After consideration of risks, benefits and other options for treatment, the patient has consented to  Procedure(s) with comments: COLONOSCOPY WITH PROPOFOL (N/A) - 8:15am ESOPHAGOGASTRODUODENOSCOPY (EGD) WITH PROPOFOL (N/A) as a surgical intervention.  The patient's history has been reviewed, patient examined, no change in status, stable for surgery.  I have reviewed the patient's chart and labs.  Questions were answered to the patient's satisfaction.     William [name]  No change.  No dysphagia.  EGD and colonoscopy per plan.  The risks, benefits, limitations, imponderables and alternatives regarding both EGD and colonoscopy have been reviewed with the patient. Questions have been answered. All parties agreeable.

## 2019-11-03 NOTE — Anesthesia Preprocedure Evaluation (Signed)
Anesthesia Evaluation  Patient identified by MRN, date of birth, ID band Patient awake    Reviewed: Allergy & Precautions, NPO status , Patient's Chart, lab work & pertinent test results, reviewed documented beta blocker date and time   History of Anesthesia Complications Negative for: history of anesthetic complications  Airway Mallampati: II  TM Distance: >3 FB Neck ROM: Full    Dental no notable dental hx.    Pulmonary former smoker,    Pulmonary exam normal breath sounds clear to auscultation       Cardiovascular hypertension, + CAD and + Cardiac Stents (7 years ago)  Normal cardiovascular exam Rhythm:Regular Rate:Normal  EKG - NSR   Neuro/Psych Anxiety    GI/Hepatic GERD  ,  Endo/Other  diabetes  Renal/GU      Musculoskeletal   Abdominal   Peds  Hematology  (+) anemia ,   Anesthesia Other Findings   Reproductive/Obstetrics                             Anesthesia Physical Anesthesia Plan  ASA: III  Anesthesia Plan: General   Post-op Pain Management:    Induction: Intravenous  PONV Risk Score and Plan:   Airway Management Planned: Nasal Cannula  Additional Equipment:   Intra-op Plan:   Post-operative Plan:   Informed Consent: I have reviewed the patients History and Physical, chart, labs and discussed the procedure including the risks, benefits and alternatives for the proposed anesthesia with the patient or authorized representative who has indicated his/her understanding and acceptance.     Dental advisory given  Plan Discussed with: CRNA  Anesthesia Plan Comments:         Anesthesia Quick Evaluation

## 2019-11-04 ENCOUNTER — Other Ambulatory Visit: Payer: Self-pay

## 2019-11-04 ENCOUNTER — Encounter: Payer: Self-pay | Admitting: Internal Medicine

## 2019-11-04 LAB — SURGICAL PATHOLOGY

## 2019-11-04 NOTE — Progress Notes (Signed)
.  rmr 

## 2019-11-08 ENCOUNTER — Encounter (HOSPITAL_COMMUNITY): Payer: Self-pay | Admitting: Internal Medicine

## 2019-11-18 DIAGNOSIS — G894 Chronic pain syndrome: Secondary | ICD-10-CM | POA: Diagnosis not present

## 2019-11-18 DIAGNOSIS — Z6830 Body mass index (BMI) 30.0-30.9, adult: Secondary | ICD-10-CM | POA: Diagnosis not present

## 2019-11-18 DIAGNOSIS — E1140 Type 2 diabetes mellitus with diabetic neuropathy, unspecified: Secondary | ICD-10-CM | POA: Diagnosis not present

## 2019-11-18 DIAGNOSIS — R201 Hypoesthesia of skin: Secondary | ICD-10-CM | POA: Diagnosis not present

## 2019-11-18 DIAGNOSIS — M1991 Primary osteoarthritis, unspecified site: Secondary | ICD-10-CM | POA: Diagnosis not present

## 2019-11-18 DIAGNOSIS — B351 Tinea unguium: Secondary | ICD-10-CM | POA: Diagnosis not present

## 2019-12-15 DIAGNOSIS — E6609 Other obesity due to excess calories: Secondary | ICD-10-CM | POA: Diagnosis not present

## 2019-12-15 DIAGNOSIS — G894 Chronic pain syndrome: Secondary | ICD-10-CM | POA: Diagnosis not present

## 2019-12-15 DIAGNOSIS — M1991 Primary osteoarthritis, unspecified site: Secondary | ICD-10-CM | POA: Diagnosis not present

## 2019-12-15 DIAGNOSIS — Z6831 Body mass index (BMI) 31.0-31.9, adult: Secondary | ICD-10-CM | POA: Diagnosis not present

## 2019-12-21 ENCOUNTER — Other Ambulatory Visit: Payer: Self-pay

## 2019-12-21 ENCOUNTER — Ambulatory Visit: Payer: Medicare HMO | Admitting: Nurse Practitioner

## 2019-12-21 ENCOUNTER — Encounter: Payer: Self-pay | Admitting: Internal Medicine

## 2019-12-21 ENCOUNTER — Encounter: Payer: Self-pay | Admitting: Nurse Practitioner

## 2019-12-21 VITALS — BP 118/59 | HR 64 | Temp 97.1°F | Ht 72.0 in | Wt 230.8 lb

## 2019-12-21 DIAGNOSIS — K219 Gastro-esophageal reflux disease without esophagitis: Secondary | ICD-10-CM | POA: Diagnosis not present

## 2019-12-21 DIAGNOSIS — D509 Iron deficiency anemia, unspecified: Secondary | ICD-10-CM | POA: Diagnosis not present

## 2019-12-21 NOTE — Patient Instructions (Signed)
Your health issues we discussed today were:   GERD (reflux/heartburn) with irritation of the stomach (gastritis): 1. Continue taking Protonix 40 mg 2. Let us know if you see any black stools 3. Let us know if you have any worsening or severe reflux symptoms  Iron deficiency anemia: 1. As discussed, your anemia is likely because of multiple things, including your kidney disease 2. Your upper endoscopy looked pretty good, just some irritation of the stomach and Protonix should help 3. We were unable to complete a colonoscopy because there is still stool in your colon, even though you took all your bowel prep 4. As discussed, I understand you are not really wanting to have it repeated at this time.  We can revisit this idea in 1 to 2 years 5. Let us know if you see any obvious bleeding your stools or any black stools which point we can make further recommendations 6. Continue to follow with nephrology to monitor your anemia and give you iron and/or blood as needed  Overall I recommend:  1. Continue other current medications 2. Return for follow-up in 6 months 3. Call us for any questions or concerns   ---------------------------------------------------------------  I am glad you have gotten your COVID-19 vaccination!  Even though you are fully vaccinated you should continue to follow CDC and state/local guidelines.  ---------------------------------------------------------------   At [facility] we value your feedback. You may receive a survey about your visit today. Please share your experience as we strive to create trusting relationships with our patients to provide genuine, compassionate, quality care.  We appreciate your understanding and patience as we review any laboratory studies, imaging, and other diagnostic tests that are ordered as we care for you. Our office policy is 5 business days for review of these results, and any emergent or urgent results are addressed  in a timely manner for your best interest. If you do not hear from our office in 1 week, please contact us.   We also encourage the use of MyChart, which contains your medical information for your review as well. If you are not enrolled in this feature, an access code is on this after visit summary for your convenience. Thank you for allowing [place] to be involved in your care.  It was great to see you today!  I hope you have a Merry Christmas and Happy Holidays!!
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
## 2019-12-22 NOTE — Progress Notes (Signed)
Cc'ed to pcp °

## 2020-01-03 ENCOUNTER — Ambulatory Visit: Payer: Medicare HMO | Admitting: Nurse Practitioner

## 2020-01-04 DIAGNOSIS — I129 Hypertensive chronic kidney disease with stage 1 through stage 4 chronic kidney disease, or unspecified chronic kidney disease: Secondary | ICD-10-CM | POA: Diagnosis not present

## 2020-01-04 DIAGNOSIS — E559 Vitamin D deficiency, unspecified: Secondary | ICD-10-CM | POA: Diagnosis not present

## 2020-01-04 DIAGNOSIS — E211 Secondary hyperparathyroidism, not elsewhere classified: Secondary | ICD-10-CM | POA: Diagnosis not present

## 2020-01-04 DIAGNOSIS — R809 Proteinuria, unspecified: Secondary | ICD-10-CM | POA: Diagnosis not present

## 2020-01-04 DIAGNOSIS — N1831 Chronic kidney disease, stage 3a: Secondary | ICD-10-CM | POA: Diagnosis not present

## 2020-01-10 DIAGNOSIS — N1831 Chronic kidney disease, stage 3a: Secondary | ICD-10-CM | POA: Diagnosis not present

## 2020-01-10 DIAGNOSIS — R809 Proteinuria, unspecified: Secondary | ICD-10-CM | POA: Diagnosis not present

## 2020-01-10 DIAGNOSIS — E559 Vitamin D deficiency, unspecified: Secondary | ICD-10-CM | POA: Diagnosis not present

## 2020-01-10 DIAGNOSIS — I129 Hypertensive chronic kidney disease with stage 1 through stage 4 chronic kidney disease, or unspecified chronic kidney disease: Secondary | ICD-10-CM | POA: Diagnosis not present

## 2020-01-10 DIAGNOSIS — E211 Secondary hyperparathyroidism, not elsewhere classified: Secondary | ICD-10-CM | POA: Diagnosis not present

## 2020-01-16 DIAGNOSIS — R051 Acute cough: Secondary | ICD-10-CM | POA: Diagnosis not present

## 2020-01-16 DIAGNOSIS — Z6831 Body mass index (BMI) 31.0-31.9, adult: Secondary | ICD-10-CM | POA: Diagnosis not present

## 2020-01-16 DIAGNOSIS — G894 Chronic pain syndrome: Secondary | ICD-10-CM | POA: Diagnosis not present

## 2020-01-16 DIAGNOSIS — Z1331 Encounter for screening for depression: Secondary | ICD-10-CM | POA: Diagnosis not present

## 2020-01-16 DIAGNOSIS — Z0000 Encounter for general adult medical examination without abnormal findings: Secondary | ICD-10-CM | POA: Diagnosis not present

## 2020-01-16 DIAGNOSIS — Z1389 Encounter for screening for other disorder: Secondary | ICD-10-CM | POA: Diagnosis not present

## 2020-02-16 DIAGNOSIS — G894 Chronic pain syndrome: Secondary | ICD-10-CM | POA: Diagnosis not present

## 2020-02-16 DIAGNOSIS — Z6831 Body mass index (BMI) 31.0-31.9, adult: Secondary | ICD-10-CM | POA: Diagnosis not present

## 2020-02-16 DIAGNOSIS — E6609 Other obesity due to excess calories: Secondary | ICD-10-CM | POA: Diagnosis not present

## 2020-02-16 DIAGNOSIS — Z1389 Encounter for screening for other disorder: Secondary | ICD-10-CM | POA: Diagnosis not present

## 2020-03-06 DIAGNOSIS — I129 Hypertensive chronic kidney disease with stage 1 through stage 4 chronic kidney disease, or unspecified chronic kidney disease: Secondary | ICD-10-CM | POA: Diagnosis not present

## 2020-03-06 DIAGNOSIS — N1831 Chronic kidney disease, stage 3a: Secondary | ICD-10-CM | POA: Diagnosis not present

## 2020-03-06 DIAGNOSIS — E559 Vitamin D deficiency, unspecified: Secondary | ICD-10-CM | POA: Diagnosis not present

## 2020-03-06 DIAGNOSIS — E211 Secondary hyperparathyroidism, not elsewhere classified: Secondary | ICD-10-CM | POA: Diagnosis not present

## 2020-03-06 DIAGNOSIS — R809 Proteinuria, unspecified: Secondary | ICD-10-CM | POA: Diagnosis not present

## 2020-03-14 DIAGNOSIS — I129 Hypertensive chronic kidney disease with stage 1 through stage 4 chronic kidney disease, or unspecified chronic kidney disease: Secondary | ICD-10-CM | POA: Diagnosis not present

## 2020-03-14 DIAGNOSIS — E211 Secondary hyperparathyroidism, not elsewhere classified: Secondary | ICD-10-CM | POA: Diagnosis not present

## 2020-03-14 DIAGNOSIS — E559 Vitamin D deficiency, unspecified: Secondary | ICD-10-CM | POA: Diagnosis not present

## 2020-03-14 DIAGNOSIS — N1831 Chronic kidney disease, stage 3a: Secondary | ICD-10-CM | POA: Diagnosis not present

## 2020-03-14 DIAGNOSIS — R809 Proteinuria, unspecified: Secondary | ICD-10-CM | POA: Diagnosis not present

## 2020-03-15 DIAGNOSIS — G894 Chronic pain syndrome: Secondary | ICD-10-CM | POA: Diagnosis not present

## 2020-03-15 DIAGNOSIS — E6609 Other obesity due to excess calories: Secondary | ICD-10-CM | POA: Diagnosis not present

## 2020-03-15 DIAGNOSIS — R011 Cardiac murmur, unspecified: Secondary | ICD-10-CM | POA: Diagnosis not present

## 2020-03-15 DIAGNOSIS — Z6831 Body mass index (BMI) 31.0-31.9, adult: Secondary | ICD-10-CM | POA: Diagnosis not present

## 2020-04-16 ENCOUNTER — Encounter: Payer: Self-pay | Admitting: Internal Medicine

## 2020-04-16 DIAGNOSIS — E6609 Other obesity due to excess calories: Secondary | ICD-10-CM | POA: Diagnosis not present

## 2020-04-16 DIAGNOSIS — Z6831 Body mass index (BMI) 31.0-31.9, adult: Secondary | ICD-10-CM | POA: Diagnosis not present

## 2020-04-16 DIAGNOSIS — G894 Chronic pain syndrome: Secondary | ICD-10-CM | POA: Diagnosis not present

## 2020-04-16 DIAGNOSIS — M1991 Primary osteoarthritis, unspecified site: Secondary | ICD-10-CM | POA: Diagnosis not present

## 2020-05-04 DIAGNOSIS — E1165 Type 2 diabetes mellitus with hyperglycemia: Secondary | ICD-10-CM | POA: Diagnosis not present

## 2020-05-04 DIAGNOSIS — I10 Essential (primary) hypertension: Secondary | ICD-10-CM | POA: Diagnosis not present

## 2020-05-04 DIAGNOSIS — M1991 Primary osteoarthritis, unspecified site: Secondary | ICD-10-CM | POA: Diagnosis not present

## 2020-05-08 DIAGNOSIS — M1991 Primary osteoarthritis, unspecified site: Secondary | ICD-10-CM | POA: Diagnosis not present

## 2020-05-08 DIAGNOSIS — L309 Dermatitis, unspecified: Secondary | ICD-10-CM | POA: Diagnosis not present

## 2020-05-08 DIAGNOSIS — E1322 Other specified diabetes mellitus with diabetic chronic kidney disease: Secondary | ICD-10-CM | POA: Diagnosis not present

## 2020-05-08 DIAGNOSIS — Z6831 Body mass index (BMI) 31.0-31.9, adult: Secondary | ICD-10-CM | POA: Diagnosis not present

## 2020-05-08 DIAGNOSIS — E1165 Type 2 diabetes mellitus with hyperglycemia: Secondary | ICD-10-CM | POA: Diagnosis not present

## 2020-05-08 DIAGNOSIS — E1140 Type 2 diabetes mellitus with diabetic neuropathy, unspecified: Secondary | ICD-10-CM | POA: Diagnosis not present

## 2020-05-08 DIAGNOSIS — E1129 Type 2 diabetes mellitus with other diabetic kidney complication: Secondary | ICD-10-CM | POA: Diagnosis not present

## 2020-05-08 DIAGNOSIS — E7849 Other hyperlipidemia: Secondary | ICD-10-CM | POA: Diagnosis not present

## 2020-05-08 DIAGNOSIS — E1142 Type 2 diabetes mellitus with diabetic polyneuropathy: Secondary | ICD-10-CM | POA: Diagnosis not present

## 2020-05-08 DIAGNOSIS — E6609 Other obesity due to excess calories: Secondary | ICD-10-CM | POA: Diagnosis not present

## 2020-06-05 DIAGNOSIS — E559 Vitamin D deficiency, unspecified: Secondary | ICD-10-CM | POA: Diagnosis not present

## 2020-06-05 DIAGNOSIS — I129 Hypertensive chronic kidney disease with stage 1 through stage 4 chronic kidney disease, or unspecified chronic kidney disease: Secondary | ICD-10-CM | POA: Diagnosis not present

## 2020-06-05 DIAGNOSIS — N1831 Chronic kidney disease, stage 3a: Secondary | ICD-10-CM | POA: Diagnosis not present

## 2020-06-05 DIAGNOSIS — R809 Proteinuria, unspecified: Secondary | ICD-10-CM | POA: Diagnosis not present

## 2020-06-05 DIAGNOSIS — E211 Secondary hyperparathyroidism, not elsewhere classified: Secondary | ICD-10-CM | POA: Diagnosis not present

## 2020-06-07 ENCOUNTER — Other Ambulatory Visit: Payer: Self-pay | Admitting: Family Medicine

## 2020-06-07 ENCOUNTER — Ambulatory Visit (INDEPENDENT_AMBULATORY_CARE_PROVIDER_SITE_OTHER): Payer: Medicare HMO

## 2020-06-07 DIAGNOSIS — I359 Nonrheumatic aortic valve disorder, unspecified: Secondary | ICD-10-CM | POA: Diagnosis not present

## 2020-06-07 LAB — ECHOCARDIOGRAM COMPLETE
AR max vel: 0.83 cm2
AV Area VTI: 0.88 cm2
AV Area mean vel: 0.76 cm2
AV Mean grad: 30 mmHg
AV Peak grad: 49.1 mmHg
Ao pk vel: 3.51 m/s
Area-P 1/2: 3.06 cm2
Calc EF: 59 %
MV M vel: 4.85 m/s
MV Peak grad: 94 mmHg
Radius: 0.3 cm
S' Lateral: 3.22 cm
Single Plane A2C EF: 54.3 %
Single Plane A4C EF: 63.6 %

## 2020-06-08 ENCOUNTER — Telehealth: Payer: Self-pay | Admitting: *Deleted

## 2020-06-08 NOTE — Telephone Encounter (Signed)
-----   Message from Verta Ellen., NP sent at 06/08/2020  2:06 PM EDT ----- Please call the patient let him know that the recent echocardiogram showed he has good pumping function of the heart.  There is some mild increase in muscle mass in the main pumping chamber.  Best management is to keep blood pressure at or below 130/80.  His mitral valve is mild to moderately leaky.  His aortic stenosis is considered moderate to severe when compared to previous echocardiogram when it was only moderate.  If he is asymptomatic we will just do another echocardiogram in 6 months to recheck.  Go ahead and schedule a follow-up echocardiogram in 6 months.

## 2020-06-08 NOTE — Telephone Encounter (Signed)
Laurine Blazer, LPN  07/07/5364 4:40 PM EDT Back to Top     Notified, copy to pcp.

## 2020-06-14 DIAGNOSIS — Z6831 Body mass index (BMI) 31.0-31.9, adult: Secondary | ICD-10-CM | POA: Diagnosis not present

## 2020-06-14 DIAGNOSIS — N1831 Chronic kidney disease, stage 3a: Secondary | ICD-10-CM | POA: Diagnosis not present

## 2020-06-14 DIAGNOSIS — M150 Primary generalized (osteo)arthritis: Secondary | ICD-10-CM | POA: Diagnosis not present

## 2020-06-14 DIAGNOSIS — G894 Chronic pain syndrome: Secondary | ICD-10-CM | POA: Diagnosis not present

## 2020-06-14 DIAGNOSIS — E6609 Other obesity due to excess calories: Secondary | ICD-10-CM | POA: Diagnosis not present

## 2020-06-14 DIAGNOSIS — E211 Secondary hyperparathyroidism, not elsewhere classified: Secondary | ICD-10-CM | POA: Diagnosis not present

## 2020-06-14 DIAGNOSIS — R809 Proteinuria, unspecified: Secondary | ICD-10-CM | POA: Diagnosis not present

## 2020-06-14 DIAGNOSIS — I129 Hypertensive chronic kidney disease with stage 1 through stage 4 chronic kidney disease, or unspecified chronic kidney disease: Secondary | ICD-10-CM | POA: Diagnosis not present

## 2020-06-14 DIAGNOSIS — E559 Vitamin D deficiency, unspecified: Secondary | ICD-10-CM | POA: Diagnosis not present

## 2020-06-20 ENCOUNTER — Ambulatory Visit: Payer: Medicare HMO | Admitting: Gastroenterology

## 2020-06-20 ENCOUNTER — Other Ambulatory Visit: Payer: Self-pay

## 2020-06-20 ENCOUNTER — Ambulatory Visit: Payer: Medicare HMO | Admitting: Nurse Practitioner

## 2020-06-20 ENCOUNTER — Encounter: Payer: Self-pay | Admitting: Gastroenterology

## 2020-06-20 VITALS — BP 115/64 | HR 70 | Temp 97.1°F | Ht 72.0 in | Wt 231.0 lb

## 2020-06-20 DIAGNOSIS — D509 Iron deficiency anemia, unspecified: Secondary | ICD-10-CM

## 2020-06-20 DIAGNOSIS — K219 Gastro-esophageal reflux disease without esophagitis: Secondary | ICD-10-CM | POA: Diagnosis not present
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
## 2020-06-20 NOTE — Patient Instructions (Signed)
Continue Protonix once daily.  Please call if any black, tarry stool or bright red blood per rectum!  We will see you in February 2023!  It was a pleasure to see you today. I want to create trusting relationships with patients to provide genuine, compassionate, and quality care. I value your feedback. If you receive a survey regarding your visit,  I greatly appreciate you taking time to fill this out.   Annitta Needs, PhD, ANP-BC [facility]

## 2020-06-27 ENCOUNTER — Other Ambulatory Visit: Payer: Medicare HMO

## 2020-06-27 ENCOUNTER — Encounter: Payer: Self-pay | Admitting: Gastroenterology

## 2020-06-27 ENCOUNTER — Telehealth: Payer: Self-pay | Admitting: Gastroenterology

## 2020-06-27 ENCOUNTER — Other Ambulatory Visit: Payer: Self-pay | Admitting: *Deleted

## 2020-06-27 DIAGNOSIS — D509 Iron deficiency anemia, unspecified: Secondary | ICD-10-CM

## 2020-06-27 NOTE — Telephone Encounter (Signed)
Spoke to pt.  He requested [place] to change order to be completed at [place].  Changed accordingly.

## 2020-06-27 NOTE — Progress Notes (Signed)
.  ir

## 2020-06-27 NOTE — Telephone Encounter (Signed)
[place] nurse: can we have patient check iron studies? I have ordered. Thanks!

## 2020-06-28 ENCOUNTER — Other Ambulatory Visit: Payer: Self-pay | Admitting: *Deleted

## 2020-06-28 ENCOUNTER — Telehealth: Payer: Self-pay | Admitting: Internal Medicine

## 2020-06-28 DIAGNOSIS — D509 Iron deficiency anemia, unspecified: Secondary | ICD-10-CM | POA: Diagnosis not present

## 2020-06-28 NOTE — Telephone Encounter (Signed)
PATIENT CALLED AND SAID THAT THE LAB DOES NOT HAVE HIS ORDER, HE IS THERE, PLEASE FAX

## 2020-06-28 NOTE — Telephone Encounter (Signed)
Noted  

## 2020-06-28 NOTE — Telephone Encounter (Signed)
Called pt. He states he already went to the lab and left and they did draw his blood.

## 2020-06-29 LAB — IRON,TIBC AND FERRITIN PANEL
Ferritin: 26 ng/mL — ABNORMAL LOW (ref 30–400)
Iron Saturation: 18 % (ref 15–55)
Iron: 75 ug/dL (ref 38–169)
Total Iron Binding Capacity: 415 ug/dL (ref 250–450)
UIBC: 340 ug/dL (ref 111–343)
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
## 2020-07-02 ENCOUNTER — Ambulatory Visit (INDEPENDENT_AMBULATORY_CARE_PROVIDER_SITE_OTHER): Payer: Medicare HMO | Admitting: Family Medicine

## 2020-07-02 ENCOUNTER — Other Ambulatory Visit: Payer: Self-pay

## 2020-07-02 ENCOUNTER — Encounter: Payer: Self-pay | Admitting: Family Medicine

## 2020-07-02 VITALS — BP 116/60 | HR 68 | Ht 69.0 in | Wt 229.4 lb

## 2020-07-02 DIAGNOSIS — I10 Essential (primary) hypertension: Secondary | ICD-10-CM

## 2020-07-02 DIAGNOSIS — I2510 Atherosclerotic heart disease of native coronary artery without angina pectoris: Secondary | ICD-10-CM

## 2020-07-02 DIAGNOSIS — E785 Hyperlipidemia, unspecified: Secondary | ICD-10-CM | POA: Diagnosis not present

## 2020-07-02 DIAGNOSIS — I350 Nonrheumatic aortic (valve) stenosis: Secondary | ICD-10-CM

## 2020-07-02 DIAGNOSIS — I5032 Chronic diastolic (congestive) heart failure: Secondary | ICD-10-CM

## 2020-07-02 MED ORDER — NITROGLYCERIN 0.4 MG SL SUBL: 0.4000 mg | SUBLINGUAL_TABLET | SUBLINGUAL | 2 refills | Status: AC | PRN

## 2020-07-02 NOTE — Patient Instructions (Addendum)

## 2020-07-16 DIAGNOSIS — Z6831 Body mass index (BMI) 31.0-31.9, adult: Secondary | ICD-10-CM | POA: Diagnosis not present

## 2020-07-16 DIAGNOSIS — E6609 Other obesity due to excess calories: Secondary | ICD-10-CM | POA: Diagnosis not present

## 2020-07-16 DIAGNOSIS — L821 Other seborrheic keratosis: Secondary | ICD-10-CM | POA: Diagnosis not present

## 2020-07-16 DIAGNOSIS — M150 Primary generalized (osteo)arthritis: Secondary | ICD-10-CM | POA: Diagnosis not present

## 2020-07-16 DIAGNOSIS — G894 Chronic pain syndrome: Secondary | ICD-10-CM | POA: Diagnosis not present

## 2020-08-15 DIAGNOSIS — H5200 Hypermetropia, unspecified eye: Secondary | ICD-10-CM | POA: Diagnosis not present

## 2020-08-15 DIAGNOSIS — E119 Type 2 diabetes mellitus without complications: Secondary | ICD-10-CM | POA: Diagnosis not present

## 2020-08-15 DIAGNOSIS — Z0100 Encounter for examination of eyes and vision without abnormal findings: Secondary | ICD-10-CM | POA: Diagnosis not present

## 2020-08-16 DIAGNOSIS — Z6831 Body mass index (BMI) 31.0-31.9, adult: Secondary | ICD-10-CM | POA: Diagnosis not present

## 2020-08-16 DIAGNOSIS — E6609 Other obesity due to excess calories: Secondary | ICD-10-CM | POA: Diagnosis not present

## 2020-08-16 DIAGNOSIS — G894 Chronic pain syndrome: Secondary | ICD-10-CM | POA: Diagnosis not present

## 2020-09-06 DIAGNOSIS — N1831 Chronic kidney disease, stage 3a: Secondary | ICD-10-CM | POA: Diagnosis not present

## 2020-09-06 DIAGNOSIS — E211 Secondary hyperparathyroidism, not elsewhere classified: Secondary | ICD-10-CM | POA: Diagnosis not present

## 2020-09-06 DIAGNOSIS — I129 Hypertensive chronic kidney disease with stage 1 through stage 4 chronic kidney disease, or unspecified chronic kidney disease: Secondary | ICD-10-CM | POA: Diagnosis not present

## 2020-09-06 DIAGNOSIS — R809 Proteinuria, unspecified: Secondary | ICD-10-CM | POA: Diagnosis not present

## 2020-09-06 DIAGNOSIS — E559 Vitamin D deficiency, unspecified: Secondary | ICD-10-CM | POA: Diagnosis not present

## 2020-09-14 DIAGNOSIS — I129 Hypertensive chronic kidney disease with stage 1 through stage 4 chronic kidney disease, or unspecified chronic kidney disease: Secondary | ICD-10-CM | POA: Diagnosis not present

## 2020-09-14 DIAGNOSIS — E559 Vitamin D deficiency, unspecified: Secondary | ICD-10-CM | POA: Diagnosis not present

## 2020-09-14 DIAGNOSIS — R809 Proteinuria, unspecified: Secondary | ICD-10-CM | POA: Diagnosis not present

## 2020-09-14 DIAGNOSIS — E211 Secondary hyperparathyroidism, not elsewhere classified: Secondary | ICD-10-CM | POA: Diagnosis not present

## 2020-09-14 DIAGNOSIS — N401 Enlarged prostate with lower urinary tract symptoms: Secondary | ICD-10-CM | POA: Diagnosis not present

## 2020-09-14 DIAGNOSIS — N1831 Chronic kidney disease, stage 3a: Secondary | ICD-10-CM | POA: Diagnosis not present

## 2020-09-17 DIAGNOSIS — Z6831 Body mass index (BMI) 31.0-31.9, adult: Secondary | ICD-10-CM | POA: Diagnosis not present

## 2020-09-17 DIAGNOSIS — Z23 Encounter for immunization: Secondary | ICD-10-CM | POA: Diagnosis not present

## 2020-09-17 DIAGNOSIS — R39198 Other difficulties with micturition: Secondary | ICD-10-CM | POA: Diagnosis not present

## 2020-09-17 DIAGNOSIS — E6609 Other obesity due to excess calories: Secondary | ICD-10-CM | POA: Diagnosis not present

## 2020-09-17 DIAGNOSIS — G894 Chronic pain syndrome: Secondary | ICD-10-CM | POA: Diagnosis not present

## 2020-09-26 ENCOUNTER — Ambulatory Visit (INDEPENDENT_AMBULATORY_CARE_PROVIDER_SITE_OTHER): Payer: Medicare HMO | Admitting: Urology

## 2020-09-26 ENCOUNTER — Other Ambulatory Visit: Payer: Self-pay

## 2020-09-26 ENCOUNTER — Encounter: Payer: Self-pay | Admitting: Urology

## 2020-09-26 VITALS — BP 122/64 | HR 63

## 2020-09-26 DIAGNOSIS — N400 Enlarged prostate without lower urinary tract symptoms: Secondary | ICD-10-CM | POA: Diagnosis not present

## 2020-09-26 DIAGNOSIS — N200 Calculus of kidney: Secondary | ICD-10-CM

## 2020-09-26 DIAGNOSIS — R3912 Poor urinary stream: Secondary | ICD-10-CM

## 2020-09-26 LAB — URINALYSIS, ROUTINE W REFLEX MICROSCOPIC
Bilirubin, UA: NEGATIVE
Ketones, UA: NEGATIVE
Leukocytes,UA: NEGATIVE
Nitrite, UA: NEGATIVE
Protein,UA: NEGATIVE
RBC, UA: NEGATIVE
Specific Gravity, UA: <1.005 — ABNORMAL LOW (ref 1.005–1.030)
Urobilinogen, Ur: 0.2 mg/dL (ref 0.2–1.0)
pH, UA: 6 (ref 5.0–7.5)

## 2020-09-26 LAB — MICROSCOPIC EXAMINATION
Bacteria, UA: NONE SEEN
RBC, Urine: NONE SEEN /hpf (ref 0–2)

## 2020-09-26 LAB — BLADDER SCAN AMB NON-IMAGING: Scan Result: 440

## 2020-09-26 MED ORDER — SILODOSIN 8 MG PO CAPS: 8.0000 mg | ORAL_CAPSULE | Freq: Every day | ORAL | 11 refills | Status: DC

## 2020-09-26 NOTE — Patient Instructions (Signed)
Benign Prostatic Hyperplasia Benign prostatic hyperplasia (BPH) is an enlarged prostate gland that is caused by the normal aging process and not by cancer. The prostate is a walnut-sized gland that is involved in the production of semen. It is located in front of the rectum and below the bladder. The bladder stores urine and the urethra is the tube that carries the urine out of the body. The prostate may get bigger as a man gets older. An enlarged prostate can press on the urethra. This can make it harder to pass urine. The build-up of urine in the bladder can cause infection. Back pressure and infection may progress to bladder damage and kidney (renal) failure. What are the causes? This condition is part of a normal aging process. However, not all men develop problems from this condition. If the prostate enlarges away from the urethra, urine flow will not be blocked. If it enlarges toward the urethra and compresses it, there will be problems passing urine. What increases the risk? This condition is more likely to develop in men over the age of 50 years. What are the signs or symptoms? Symptoms of this condition include: Getting up often during the night to urinate. Needing to urinate frequently during the day. Difficulty starting urine flow. Decrease in size and strength of your urine stream. Leaking (dribbling) after urinating. Inability to pass urine. This needs immediate treatment. Inability to completely empty your bladder. Pain when you pass urine. This is more common if there is also an infection. Urinary tract infection (UTI). How is this diagnosed? This condition is diagnosed based on your medical history, a physical exam, and your symptoms. Tests will also be done, such as: A post-void bladder scan. This measures any amount of urine that may remain in your bladder after you finish urinating. A digital rectal exam. In a rectal exam, your health care provider checks your prostate by  putting a lubricated, gloved finger into your rectum to feel the back of your prostate gland. This exam detects the size of your gland and any abnormal lumps or growths. An exam of your urine (urinalysis). A prostate specific antigen (PSA) screening. This is a blood test used to screen for prostate cancer. An ultrasound. This test uses sound waves to electronically produce a picture of your prostate gland. Your health care provider may refer you to a specialist in kidney and prostate diseases (urologist). How is this treated? Once symptoms begin, your health care provider will monitor your condition (active surveillance or watchful waiting). Treatment for this condition will depend on the severity of your condition. Treatment may include: Observation and yearly exams. This may be the only treatment needed if your condition and symptoms are mild. Medicines to relieve your symptoms, including: Medicines to shrink the prostate. Medicines to relax the muscle of the prostate. Surgery in severe cases. Surgery may include: Prostatectomy. In this procedure, the prostate tissue is removed completely through an open incision or with a laparoscope or robotics. Transurethral resection of the prostate (TURP). In this procedure, a tool is inserted through the opening at the tip of the penis (urethra). It is used to cut away tissue of the inner core of the prostate. The pieces are removed through the same opening of the penis. This removes the blockage. Transurethral incision (TUIP). In this procedure, small cuts are made in the prostate. This lessens the prostate's pressure on the urethra. Transurethral microwave thermotherapy (TUMT). This procedure uses microwaves to create heat. The heat destroys and removes a   small amount of prostate tissue. Transurethral needle ablation (TUNA). This procedure uses radio frequencies to destroy and remove a small amount of prostate tissue. Interstitial laser coagulation (ILC).  This procedure uses a laser to destroy and remove a small amount of prostate tissue. Transurethral electrovaporization (TUVP). This procedure uses electrodes to destroy and remove a small amount of prostate tissue. Prostatic urethral lift. This procedure inserts an implant to push the lobes of the prostate away from the urethra. Follow these instructions at home: Take over-the-counter and prescription medicines only as told by your health care provider. Monitor your symptoms for any changes. Contact your health care provider with any changes. Avoid drinking large amounts of liquid before going to bed or out in public. Avoid or reduce how much caffeine or alcohol you drink. Give yourself time when you urinate. Keep all follow-up visits as told by your health care provider. This is important. Contact a health care provider if: You have unexplained back pain. Your symptoms do not get better with treatment. You develop side effects from the medicine you are taking. Your urine becomes very dark or has a bad smell. Your lower abdomen becomes distended and you have trouble passing your urine. Get help right away if: You have a fever or chills. You suddenly cannot urinate. You feel lightheaded, or very dizzy, or you faint. There are large amounts of blood or clots in the urine. Your urinary problems become hard to manage. You develop moderate to severe low back or flank pain. The flank is the side of your body between the ribs and the hip. These symptoms may represent a serious problem that is an emergency. Do not wait to see if the symptoms will go away. Get medical help right away. Call your local emergency services (911 in the [place] Do not drive yourself to the hospital. Summary Benign prostatic hyperplasia (BPH) is an enlarged prostate that is caused by the normal aging process and not by cancer. An enlarged prostate can press on the urethra. This can make it hard to pass urine. This  condition is part of a normal aging process and is more likely to develop in men over the age of 50 years. Get help right away if you suddenly cannot urinate. This information is not intended to replace advice given to you by your health care provider. Make sure you discuss any questions you have with your health care provider. Document Revised: 04/04/2020 Document Reviewed: 09/01/2019 Elsevier Patient Education  2022 [employer]  
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
## 2020-09-26 NOTE — Progress Notes (Signed)
post void residual=440   Urological Symptom Review  Patient is experiencing the following symptoms: Frequent urination Hard to postpone urination Burning/pain with urination Get up at night to urinate Leakage of urine Stream starts and stops Trouble starting stream Have to strain to urinate Weak stream Kidney stones   Review of Systems  Gastrointestinal (upper)  : Negative for upper GI symptoms  Gastrointestinal (lower) : Negative for lower GI symptoms  Constitutional : Negative for symptoms  Skin: Negative for skin symptoms  Eyes: Negative for eye symptoms  Ear/Nose/Throat : Negative for Ear/Nose/Throat symptoms  Hematologic/Lymphatic: Negative for Hematologic/Lymphatic symptoms  Cardiovascular : Negative for cardiovascular symptoms  Respiratory : Negative for respiratory symptoms  Endocrine: Negative for endocrine symptoms  Musculoskeletal: Back pain Joint pain  Neurological: Negative for neurological symptoms  Psychologic: Negative for psychiatric symptoms

## 2020-10-10 ENCOUNTER — Ambulatory Visit (HOSPITAL_COMMUNITY)
Admission: RE | Admit: 2020-10-10 | Discharge: 2020-10-10 | Disposition: A | Discharge status: Home / Self Care | Payer: Medicare HMO | Source: Ambulatory Visit | Attending: Urology | Admitting: Urology

## 2020-10-10 ENCOUNTER — Other Ambulatory Visit: Payer: Self-pay

## 2020-10-10 DIAGNOSIS — R809 Proteinuria, unspecified: Secondary | ICD-10-CM | POA: Diagnosis not present

## 2020-10-10 DIAGNOSIS — N200 Calculus of kidney: Secondary | ICD-10-CM | POA: Diagnosis not present

## 2020-10-10 DIAGNOSIS — E559 Vitamin D deficiency, unspecified: Secondary | ICD-10-CM | POA: Diagnosis not present

## 2020-10-10 DIAGNOSIS — M150 Primary generalized (osteo)arthritis: Secondary | ICD-10-CM | POA: Diagnosis not present

## 2020-10-10 DIAGNOSIS — R109 Unspecified abdominal pain: Secondary | ICD-10-CM | POA: Diagnosis not present

## 2020-10-10 DIAGNOSIS — G894 Chronic pain syndrome: Secondary | ICD-10-CM | POA: Diagnosis not present

## 2020-10-10 DIAGNOSIS — I129 Hypertensive chronic kidney disease with stage 1 through stage 4 chronic kidney disease, or unspecified chronic kidney disease: Secondary | ICD-10-CM | POA: Diagnosis not present

## 2020-10-10 DIAGNOSIS — M1991 Primary osteoarthritis, unspecified site: Secondary | ICD-10-CM | POA: Diagnosis not present

## 2020-10-10 DIAGNOSIS — E211 Secondary hyperparathyroidism, not elsewhere classified: Secondary | ICD-10-CM | POA: Diagnosis not present

## 2020-10-10 DIAGNOSIS — N1831 Chronic kidney disease, stage 3a: Secondary | ICD-10-CM | POA: Diagnosis not present

## 2020-10-10 DIAGNOSIS — E1140 Type 2 diabetes mellitus with diabetic neuropathy, unspecified: Secondary | ICD-10-CM | POA: Diagnosis not present

## 2020-10-19 DIAGNOSIS — E870 Hyperosmolality and hypernatremia: Secondary | ICD-10-CM | POA: Diagnosis not present

## 2020-10-19 DIAGNOSIS — I129 Hypertensive chronic kidney disease with stage 1 through stage 4 chronic kidney disease, or unspecified chronic kidney disease: Secondary | ICD-10-CM | POA: Diagnosis not present

## 2020-10-19 DIAGNOSIS — N1831 Chronic kidney disease, stage 3a: Secondary | ICD-10-CM | POA: Diagnosis not present

## 2020-10-19 DIAGNOSIS — R809 Proteinuria, unspecified: Secondary | ICD-10-CM | POA: Diagnosis not present

## 2020-10-26 ENCOUNTER — Other Ambulatory Visit: Payer: Self-pay

## 2020-10-26 ENCOUNTER — Ambulatory Visit: Payer: Medicare HMO | Admitting: Urology

## 2020-10-26 ENCOUNTER — Encounter: Payer: Self-pay | Admitting: Urology

## 2020-10-26 VITALS — BP 129/76 | HR 76 | Temp 98.5°F | Wt 236.0 lb

## 2020-10-26 DIAGNOSIS — N200 Calculus of kidney: Secondary | ICD-10-CM

## 2020-10-26 DIAGNOSIS — N400 Enlarged prostate without lower urinary tract symptoms: Secondary | ICD-10-CM | POA: Diagnosis not present

## 2020-10-26 DIAGNOSIS — R3912 Poor urinary stream: Secondary | ICD-10-CM

## 2020-10-26 LAB — URINALYSIS, ROUTINE W REFLEX MICROSCOPIC
Bilirubin, UA: NEGATIVE
Ketones, UA: NEGATIVE
Leukocytes,UA: NEGATIVE
Nitrite, UA: NEGATIVE
Protein,UA: NEGATIVE
Specific Gravity, UA: <1.005 — ABNORMAL LOW (ref 1.005–1.030)
Urobilinogen, Ur: 0.2 mg/dL (ref 0.2–1.0)
pH, UA: 5 (ref 5.0–7.5)

## 2020-10-26 LAB — MICROSCOPIC EXAMINATION
Epithelial Cells (non renal): NONE SEEN /hpf (ref 0–10)
Renal Epithel, UA: NONE SEEN /hpf
WBC, UA: NONE SEEN /hpf (ref 0–5)

## 2020-10-26 MED ORDER — SILODOSIN 8 MG PO CAPS
8.0000 mg | ORAL_CAPSULE | Freq: Every day | ORAL | 11 refills | Status: DC
Start: 2020-10-26 — End: 2021-07-15

## 2020-10-26 NOTE — Progress Notes (Signed)
Urological Symptom Review  Patient is experiencing the following symptoms: Frequent urination Hard to postpone urination Burning/pain with urination Stream starts and stops   Review of Systems  Gastrointestinal (upper)  : Negative for upper GI symptoms  Gastrointestinal (lower) : Negative for lower GI symptoms  Constitutional : Negative for symptoms  Skin: Negative for skin symptoms  Eyes: Negative for eye symptoms  Ear/Nose/Throat : Negative for Ear/Nose/Throat symptoms  Hematologic/Lymphatic: Negative for Hematologic/Lymphatic symptoms  Cardiovascular : Negative for cardiovascular symptoms  Respiratory : Negative for respiratory symptoms  Endocrine: Negative for endocrine symptoms  Musculoskeletal: Negative for musculoskeletal symptoms  Neurological: Negative for neurological symptoms  Psychologic: Negative for psychiatric symptoms

## 2020-10-26 NOTE — Patient Instructions (Signed)
Benign Prostatic Hyperplasia Benign prostatic hyperplasia (BPH) is an enlarged prostate gland that is caused by the normal aging process and not by cancer. The prostate is a walnut-sized gland that is involved in the production of semen. It is located in front of the rectum and below the bladder. The bladder stores urine and the urethra is the tube that carries the urine out of the body. The prostate may get bigger as a man gets older. An enlarged prostate can press on the urethra. This can make it harder to pass urine. The build-up of urine in the bladder can cause infection. Back pressure and infection may progress to bladder damage and kidney (renal) failure. What are the causes? This condition is part of a normal aging process. However, not all men develop problems from this condition. If the prostate enlarges away from the urethra, urine flow will not be blocked. If it enlarges toward the urethra and compresses it, there will be problems passing urine. What increases the risk? This condition is more likely to develop in men over the age of 50 years. What are the signs or symptoms? Symptoms of this condition include: Getting up often during the night to urinate. Needing to urinate frequently during the day. Difficulty starting urine flow. Decrease in size and strength of your urine stream. Leaking (dribbling) after urinating. Inability to pass urine. This needs immediate treatment. Inability to completely empty your bladder. Pain when you pass urine. This is more common if there is also an infection. Urinary tract infection (UTI). How is this diagnosed? This condition is diagnosed based on your medical history, a physical exam, and your symptoms. Tests will also be done, such as: A post-void bladder scan. This measures any amount of urine that may remain in your bladder after you finish urinating. A digital rectal exam. In a rectal exam, your health care provider checks your prostate by  putting a lubricated, gloved finger into your rectum to feel the back of your prostate gland. This exam detects the size of your gland and any abnormal lumps or growths. An exam of your urine (urinalysis). A prostate specific antigen (PSA) screening. This is a blood test used to screen for prostate cancer. An ultrasound. This test uses sound waves to electronically produce a picture of your prostate gland. Your health care provider may refer you to a specialist in kidney and prostate diseases (urologist). How is this treated? Once symptoms begin, your health care provider will monitor your condition (active surveillance or watchful waiting). Treatment for this condition will depend on the severity of your condition. Treatment may include: Observation and yearly exams. This may be the only treatment needed if your condition and symptoms are mild. Medicines to relieve your symptoms, including: Medicines to shrink the prostate. Medicines to relax the muscle of the prostate. Surgery in severe cases. Surgery may include: Prostatectomy. In this procedure, the prostate tissue is removed completely through an open incision or with a laparoscope or robotics. Transurethral resection of the prostate (TURP). In this procedure, a tool is inserted through the opening at the tip of the penis (urethra). It is used to cut away tissue of the inner core of the prostate. The pieces are removed through the same opening of the penis. This removes the blockage. Transurethral incision (TUIP). In this procedure, small cuts are made in the prostate. This lessens the prostate's pressure on the urethra. Transurethral microwave thermotherapy (TUMT). This procedure uses microwaves to create heat. The heat destroys and removes a   small amount of prostate tissue. Transurethral needle ablation (TUNA). This procedure uses radio frequencies to destroy and remove a small amount of prostate tissue. Interstitial laser coagulation (ILC).  This procedure uses a laser to destroy and remove a small amount of prostate tissue. Transurethral electrovaporization (TUVP). This procedure uses electrodes to destroy and remove a small amount of prostate tissue. Prostatic urethral lift. This procedure inserts an implant to push the lobes of the prostate away from the urethra. Follow these instructions at home: Take over-the-counter and prescription medicines only as told by your health care provider. Monitor your symptoms for any changes. Contact your health care provider with any changes. Avoid drinking large amounts of liquid before going to bed or out in public. Avoid or reduce how much caffeine or alcohol you drink. Give yourself time when you urinate. Keep all follow-up visits as told by your health care provider. This is important. Contact a health care provider if: You have unexplained back pain. Your symptoms do not get better with treatment. You develop side effects from the medicine you are taking. Your urine becomes very dark or has a bad smell. Your lower abdomen becomes distended and you have trouble passing your urine. Get help right away if: You have a fever or chills. You suddenly cannot urinate. You feel lightheaded, or very dizzy, or you faint. There are large amounts of blood or clots in the urine. Your urinary problems become hard to manage. You develop moderate to severe low back or flank pain. The flank is the side of your body between the ribs and the hip. These symptoms may represent a serious problem that is an emergency. Do not wait to see if the symptoms will go away. Get medical help right away. Call your local emergency services (911 in the [place] Do not drive yourself to the hospital. Summary Benign prostatic hyperplasia (BPH) is an enlarged prostate that is caused by the normal aging process and not by cancer. An enlarged prostate can press on the urethra. This can make it hard to pass urine. This  condition is part of a normal aging process and is more likely to develop in men over the age of 50 years. Get help right away if you suddenly cannot urinate. This information is not intended to replace advice given to you by your health care provider. Make sure you discuss any questions you have with your health care provider. Document Revised: 04/04/2020 Document Reviewed: 09/01/2019 Elsevier Patient Education  2022 [employer]  
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
## 2020-11-08 ENCOUNTER — Other Ambulatory Visit (HOSPITAL_COMMUNITY): Payer: Self-pay | Admitting: Nephrology

## 2020-11-08 DIAGNOSIS — N1831 Chronic kidney disease, stage 3a: Secondary | ICD-10-CM

## 2020-11-12 ENCOUNTER — Other Ambulatory Visit: Payer: Self-pay

## 2020-11-12 ENCOUNTER — Encounter (HOSPITAL_COMMUNITY)
Admission: RE | Admit: 2020-11-12 | Discharge: 2020-11-12 | Disposition: A | Discharge status: Home / Self Care | Payer: Medicare HMO | Source: Ambulatory Visit | Attending: Nephrology | Admitting: Nephrology

## 2020-11-12 DIAGNOSIS — E119 Type 2 diabetes mellitus without complications: Secondary | ICD-10-CM | POA: Diagnosis not present

## 2020-11-12 DIAGNOSIS — N1831 Chronic kidney disease, stage 3a: Secondary | ICD-10-CM | POA: Insufficient documentation

## 2020-11-12 DIAGNOSIS — I10 Essential (primary) hypertension: Secondary | ICD-10-CM | POA: Diagnosis not present

## 2020-11-12 DIAGNOSIS — Z87442 Personal history of urinary calculi: Secondary | ICD-10-CM | POA: Diagnosis not present

## 2020-11-12 MED ORDER — TECHNETIUM TC 99M SESTAMIBI - CARDIOLITE
25.0000 | Freq: Once | INTRAVENOUS | Status: AC | PRN
  Administered 2020-11-12: 11:00:00 25 via INTRAVENOUS

## 2020-11-16 DIAGNOSIS — E1140 Type 2 diabetes mellitus with diabetic neuropathy, unspecified: Secondary | ICD-10-CM | POA: Diagnosis not present

## 2020-11-16 DIAGNOSIS — Z6831 Body mass index (BMI) 31.0-31.9, adult: Secondary | ICD-10-CM | POA: Diagnosis not present

## 2020-11-16 DIAGNOSIS — E6609 Other obesity due to excess calories: Secondary | ICD-10-CM | POA: Diagnosis not present

## 2020-11-16 DIAGNOSIS — G894 Chronic pain syndrome: Secondary | ICD-10-CM | POA: Diagnosis not present

## 2020-11-16 DIAGNOSIS — M1991 Primary osteoarthritis, unspecified site: Secondary | ICD-10-CM | POA: Diagnosis not present

## 2020-12-13 DIAGNOSIS — E6609 Other obesity due to excess calories: Secondary | ICD-10-CM | POA: Diagnosis not present

## 2020-12-13 DIAGNOSIS — G894 Chronic pain syndrome: Secondary | ICD-10-CM | POA: Diagnosis not present

## 2020-12-13 DIAGNOSIS — Z6831 Body mass index (BMI) 31.0-31.9, adult: Secondary | ICD-10-CM | POA: Diagnosis not present

## 2020-12-13 DIAGNOSIS — M150 Primary generalized (osteo)arthritis: Secondary | ICD-10-CM | POA: Diagnosis not present

## 2020-12-18 DIAGNOSIS — I129 Hypertensive chronic kidney disease with stage 1 through stage 4 chronic kidney disease, or unspecified chronic kidney disease: Secondary | ICD-10-CM | POA: Diagnosis not present

## 2020-12-18 DIAGNOSIS — N1831 Chronic kidney disease, stage 3a: Secondary | ICD-10-CM | POA: Diagnosis not present

## 2020-12-18 DIAGNOSIS — R809 Proteinuria, unspecified: Secondary | ICD-10-CM | POA: Diagnosis not present

## 2020-12-18 DIAGNOSIS — E211 Secondary hyperparathyroidism, not elsewhere classified: Secondary | ICD-10-CM | POA: Diagnosis not present

## 2020-12-18 DIAGNOSIS — E870 Hyperosmolality and hypernatremia: Secondary | ICD-10-CM | POA: Diagnosis not present

## 2020-12-18 DIAGNOSIS — D631 Anemia in chronic kidney disease: Secondary | ICD-10-CM | POA: Diagnosis not present

## 2020-12-18 DIAGNOSIS — Z79899 Other long term (current) drug therapy: Secondary | ICD-10-CM | POA: Diagnosis not present

## 2020-12-26 DIAGNOSIS — N200 Calculus of kidney: Secondary | ICD-10-CM | POA: Diagnosis not present

## 2020-12-26 DIAGNOSIS — R809 Proteinuria, unspecified: Secondary | ICD-10-CM | POA: Diagnosis not present

## 2020-12-26 DIAGNOSIS — N1831 Chronic kidney disease, stage 3a: Secondary | ICD-10-CM | POA: Diagnosis not present

## 2020-12-26 DIAGNOSIS — I129 Hypertensive chronic kidney disease with stage 1 through stage 4 chronic kidney disease, or unspecified chronic kidney disease: Secondary | ICD-10-CM | POA: Diagnosis not present
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
## 2021-01-02 ENCOUNTER — Other Ambulatory Visit: Payer: Medicare HMO

## 2021-01-03 ENCOUNTER — Other Ambulatory Visit: Payer: Self-pay

## 2021-01-03 ENCOUNTER — Ambulatory Visit (INDEPENDENT_AMBULATORY_CARE_PROVIDER_SITE_OTHER): Payer: Medicare HMO

## 2021-01-03 DIAGNOSIS — I350 Nonrheumatic aortic (valve) stenosis: Secondary | ICD-10-CM | POA: Diagnosis not present

## 2021-01-03 LAB — ECHOCARDIOGRAM COMPLETE
AR max vel: 1.11 cm2
AV Area VTI: 1.03 cm2
AV Area mean vel: 1.09 cm2
AV Mean grad: 30.5 mmHg
AV Peak grad: 47.5 mmHg
Ao pk vel: 3.45 m/s
Area-P 1/2: 2.5 cm2
Calc EF: 61.7 %
S' Lateral: 2.98 cm
Single Plane A2C EF: 61.9 %
Single Plane A4C EF: 63.3 %

## 2021-01-04 ENCOUNTER — Ambulatory Visit: Payer: Medicare HMO | Admitting: Family Medicine

## 2021-01-04 ENCOUNTER — Encounter: Payer: Self-pay | Admitting: Cardiovascular Disease

## 2021-01-04 ENCOUNTER — Ambulatory Visit: Payer: Medicare HMO | Admitting: Cardiovascular Disease

## 2021-01-04 VITALS — BP 110/60 | HR 66 | Ht 69.0 in | Wt 232.0 lb

## 2021-01-04 DIAGNOSIS — I2510 Atherosclerotic heart disease of native coronary artery without angina pectoris: Secondary | ICD-10-CM | POA: Diagnosis not present

## 2021-01-04 DIAGNOSIS — I10 Essential (primary) hypertension: Secondary | ICD-10-CM | POA: Diagnosis not present

## 2021-01-04 DIAGNOSIS — E785 Hyperlipidemia, unspecified: Secondary | ICD-10-CM

## 2021-01-04 DIAGNOSIS — I350 Nonrheumatic aortic (valve) stenosis: Secondary | ICD-10-CM | POA: Diagnosis not present

## 2021-01-04 NOTE — Patient Instructions (Signed)
Medication Instructions:  Your physician recommends that you continue on your current medications as directed. Please refer to the Current Medication list given to you today.  *If you need a refill on your cardiac medications before your next appointment, please call your pharmacy*   Lab Work: NONE   If you have labs (blood work) drawn today and your tests are completely normal, you will receive your results only by: [place] (if you have MyChart) OR A paper copy in the mail If you have any lab test that is abnormal or we need to change your treatment, we will call you to review the results.   Testing/Procedures: Your physician has requested that you have an echocardiogram. Echocardiography is a painless test that uses sound waves to create images of your heart. It provides your doctor with information about the size and shape of your heart and how well your hearts chambers and valves are working. This procedure takes approximately one hour. There are no restrictions for this procedure.    Follow-Up: At [facility], you and your health needs are our priority.  As part of our continuing mission to provide you with exceptional heart care, we have created designated Provider Care Teams.  These Care Teams include your primary Cardiologist (physician) and Advanced Practice Providers (APPs -  Physician Assistants and Nurse Practitioners) who all work together to provide you with the care you need, when you need it.  We recommend signing up for the patient portal called "MyChart".  Sign up information is provided on this After Visit Summary.  MyChart is used to connect with patients for Virtual Visits (Telemedicine).  Patients are able to view lab/test results, encounter notes, upcoming appointments, etc.  Non-urgent messages can be sent to your provider as well.   To learn more about what you can do with MyChart, go to [email or web address].    Your next appointment:   7  month(s)  The format for your next appointment:   In Person  Provider:   [name], MD    Other Instructions Thank you for choosing [facility]!

## 2021-01-16 DIAGNOSIS — Z0000 Encounter for general adult medical examination without abnormal findings: Secondary | ICD-10-CM | POA: Diagnosis not present

## 2021-01-16 DIAGNOSIS — E782 Mixed hyperlipidemia: Secondary | ICD-10-CM | POA: Diagnosis not present

## 2021-01-16 DIAGNOSIS — M150 Primary generalized (osteo)arthritis: Secondary | ICD-10-CM | POA: Diagnosis not present

## 2021-01-16 DIAGNOSIS — E1140 Type 2 diabetes mellitus with diabetic neuropathy, unspecified: Secondary | ICD-10-CM | POA: Diagnosis not present

## 2021-01-16 DIAGNOSIS — Z6831 Body mass index (BMI) 31.0-31.9, adult: Secondary | ICD-10-CM | POA: Diagnosis not present

## 2021-01-16 DIAGNOSIS — E6609 Other obesity due to excess calories: Secondary | ICD-10-CM | POA: Diagnosis not present

## 2021-01-16 DIAGNOSIS — R011 Cardiac murmur, unspecified: Secondary | ICD-10-CM | POA: Diagnosis not present

## 2021-01-16 DIAGNOSIS — G894 Chronic pain syndrome: Secondary | ICD-10-CM | POA: Diagnosis not present

## 2021-01-16 DIAGNOSIS — E7849 Other hyperlipidemia: Secondary | ICD-10-CM | POA: Diagnosis not present

## 2021-01-16 DIAGNOSIS — L821 Other seborrheic keratosis: Secondary | ICD-10-CM | POA: Diagnosis not present

## 2021-01-24 ENCOUNTER — Ambulatory Visit: Payer: Medicare HMO | Admitting: "Endocrinology

## 2021-01-24 ENCOUNTER — Encounter: Payer: Self-pay | Admitting: "Endocrinology

## 2021-01-24 ENCOUNTER — Other Ambulatory Visit: Payer: Self-pay

## 2021-01-24 DIAGNOSIS — I350 Nonrheumatic aortic (valve) stenosis: Secondary | ICD-10-CM | POA: Diagnosis not present
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
## 2021-01-29 LAB — CREATININE, URINE, 24 HOUR
Creatinine, 24H Ur: 1128 mg/24 hr (ref 1000–2000)
Creatinine, Urine: 37.6 mg/dL

## 2021-01-29 LAB — CALCIUM, URINE, 24 HOUR
Calcium, 24H Urine: 75 mg/24 hr (ref 0–320)
Calcium, Urine: 2.5 mg/dL

## 2021-02-01 LAB — PTH, INTACT AND CALCIUM
Calcium: 10.1 mg/dL (ref 8.6–10.2)
PTH: 63 pg/mL (ref 15–65)

## 2021-02-01 LAB — PTH-RELATED PEPTIDE: PTH-related peptide: 8.8 pmol/L — ABNORMAL HIGH

## 2021-02-01 LAB — PHOSPHORUS: Phosphorus: 3.1 mg/dL (ref 2.8–4.1)

## 2021-02-01 LAB — MAGNESIUM: Magnesium: 1.7 mg/dL (ref 1.6–2.3)

## 2021-02-11 ENCOUNTER — Other Ambulatory Visit: Payer: Self-pay

## 2021-02-11 ENCOUNTER — Ambulatory Visit: Payer: Medicare HMO | Admitting: "Endocrinology

## 2021-02-11 ENCOUNTER — Encounter: Payer: Self-pay | Admitting: "Endocrinology

## 2021-02-11 DIAGNOSIS — Z87891 Personal history of nicotine dependence: Secondary | ICD-10-CM

## 2021-02-11 DIAGNOSIS — E212 Other hyperparathyroidism: Secondary | ICD-10-CM | POA: Diagnosis not present

## 2021-02-12 DIAGNOSIS — Z87891 Personal history of nicotine dependence: Secondary | ICD-10-CM | POA: Insufficient documentation

## 2021-02-12 DIAGNOSIS — E212 Other hyperparathyroidism: Secondary | ICD-10-CM | POA: Insufficient documentation
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
## 2021-02-14 DIAGNOSIS — E6609 Other obesity due to excess calories: Secondary | ICD-10-CM | POA: Diagnosis not present

## 2021-02-14 DIAGNOSIS — M1991 Primary osteoarthritis, unspecified site: Secondary | ICD-10-CM | POA: Diagnosis not present

## 2021-02-14 DIAGNOSIS — Z6831 Body mass index (BMI) 31.0-31.9, adult: Secondary | ICD-10-CM | POA: Diagnosis not present

## 2021-02-14 DIAGNOSIS — M150 Primary generalized (osteo)arthritis: Secondary | ICD-10-CM | POA: Diagnosis not present

## 2021-02-14 DIAGNOSIS — G894 Chronic pain syndrome: Secondary | ICD-10-CM | POA: Diagnosis not present

## 2021-02-19 ENCOUNTER — Other Ambulatory Visit: Payer: Self-pay

## 2021-02-19 ENCOUNTER — Ambulatory Visit (HOSPITAL_COMMUNITY)
Admission: RE | Admit: 2021-02-19 | Discharge: 2021-02-19 | Disposition: A | Discharge status: Home / Self Care | Payer: Medicare HMO | Source: Ambulatory Visit | Attending: "Endocrinology | Admitting: "Endocrinology

## 2021-02-19 DIAGNOSIS — E119 Type 2 diabetes mellitus without complications: Secondary | ICD-10-CM | POA: Diagnosis not present

## 2021-02-19 DIAGNOSIS — M810 Age-related osteoporosis without current pathological fracture: Secondary | ICD-10-CM | POA: Insufficient documentation

## 2021-02-19 DIAGNOSIS — Z794 Long term (current) use of insulin: Secondary | ICD-10-CM | POA: Insufficient documentation

## 2021-02-19 DIAGNOSIS — Z13820 Encounter for screening for osteoporosis: Secondary | ICD-10-CM | POA: Insufficient documentation

## 2021-02-20 ENCOUNTER — Other Ambulatory Visit: Payer: Self-pay

## 2021-02-20 ENCOUNTER — Telehealth: Payer: Self-pay | Admitting: Cardiovascular Disease

## 2021-02-20 ENCOUNTER — Ambulatory Visit: Payer: Medicare HMO | Admitting: Gastroenterology

## 2021-02-20 ENCOUNTER — Encounter: Payer: Self-pay | Admitting: *Deleted

## 2021-02-20 ENCOUNTER — Encounter: Payer: Self-pay | Admitting: Gastroenterology

## 2021-02-20 ENCOUNTER — Telehealth: Payer: Self-pay

## 2021-02-20 VITALS — BP 117/61 | HR 69 | Temp 97.1°F | Ht 69.0 in | Wt 231.0 lb

## 2021-02-20 DIAGNOSIS — Z800 Family history of malignant neoplasm of digestive organs: Secondary | ICD-10-CM

## 2021-02-20 DIAGNOSIS — I129 Hypertensive chronic kidney disease with stage 1 through stage 4 chronic kidney disease, or unspecified chronic kidney disease: Secondary | ICD-10-CM | POA: Diagnosis not present

## 2021-02-20 DIAGNOSIS — Z86010 Personal history of colonic polyps: Secondary | ICD-10-CM | POA: Diagnosis not present

## 2021-02-20 DIAGNOSIS — E212 Other hyperparathyroidism: Secondary | ICD-10-CM | POA: Diagnosis not present

## 2021-02-20 DIAGNOSIS — E1122 Type 2 diabetes mellitus with diabetic chronic kidney disease: Secondary | ICD-10-CM | POA: Diagnosis not present

## 2021-02-20 DIAGNOSIS — K219 Gastro-esophageal reflux disease without esophagitis: Secondary | ICD-10-CM | POA: Diagnosis not present

## 2021-02-20 DIAGNOSIS — N1831 Chronic kidney disease, stage 3a: Secondary | ICD-10-CM | POA: Diagnosis not present

## 2021-02-20 DIAGNOSIS — R809 Proteinuria, unspecified: Secondary | ICD-10-CM | POA: Diagnosis not present

## 2021-02-20 DIAGNOSIS — I350 Nonrheumatic aortic (valve) stenosis: Secondary | ICD-10-CM

## 2021-02-20 DIAGNOSIS — N200 Calculus of kidney: Secondary | ICD-10-CM | POA: Diagnosis not present

## 2021-02-20 DIAGNOSIS — D509 Iron deficiency anemia, unspecified: Secondary | ICD-10-CM | POA: Diagnosis not present

## 2021-02-20 MED ORDER — PEG 3350-KCL-NA BICARB-NACL 420 G PO SOLR: 4000.0000 mL | ORAL | 0 refills | Status: DC
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
## 2021-02-20 NOTE — Patient Instructions (Addendum)
It was nice meeting you today.   We will get you scheduled for a screening colonoscopy in the near future with Dr. Abbey Chatters.   Clear liquid diet 1 and a half days prior to procedure.   You may take your glipizide and metformin the night before the procedure but I want you to only take 1/2 of your normal dose of levemir. Please only take 45 units.  The morning of the procedure do NOT take your farxiga, metformin, or glipizide. You may continue your aspirin 81mg .   Follow up with the clinic 3-4 months post procedure.   It was a pleasure to see you today. I want to create trusting relationships with patients. If you receive a survey regarding your visit,  I greatly appreciate you taking time to fill this out on paper or through your MyChart. I value your feedback.  Venetia Night, MSN, FNP-BC, AGACNP-BC [facility]

## 2021-02-20 NOTE — Telephone Encounter (Signed)
PA for TCS submitted via [employer] website. PA# [id number], valid 03/11/21-06/09/21.
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
## 2021-02-20 NOTE — Telephone Encounter (Signed)
Patient needs an order for an echo prior to 7/03 appointment with [name].

## 2021-02-20 NOTE — Telephone Encounter (Signed)
completed

## 2021-02-21 ENCOUNTER — Telehealth: Payer: Self-pay | Admitting: "Endocrinology

## 2021-02-21 NOTE — Telephone Encounter (Signed)
Spoke with patient, he wants [place] to schedule his CT and call him with that. He said that he gets really tangled up with that kind of stuff.

## 2021-02-22 NOTE — Telephone Encounter (Signed)
CT scheduled for 3/30 and OV scheduled for 4/3.

## 2021-02-24 LAB — PSA: Prostate Specific Ag, Serum: 0.3 ng/mL (ref 0.0–4.0)

## 2021-02-24 LAB — PTH, INTACT AND CALCIUM
Calcium: 10.4 mg/dL — ABNORMAL HIGH (ref 8.6–10.2)
PTH: 86 pg/mL — ABNORMAL HIGH (ref 15–65)

## 2021-02-24 LAB — PTH-RELATED PEPTIDE: PTH-related peptide: 7.4 pmol/L — ABNORMAL HIGH

## 2021-02-25 ENCOUNTER — Ambulatory Visit: Payer: Medicare HMO | Admitting: "Endocrinology

## 2021-03-05 NOTE — Patient Instructions (Signed)
? ? ? ? ? ? [place] ? 03/05/2021  ?  ? @PREFPERIOPPHARMACY @ ? ? Your procedure is scheduled on  03/11/2021. ? ? Report to [facility] at  [place]. ? ? Call this number if you have problems the morning of surgery: ? [phone] ? ? Remember: ? Follow the diet and prep instructions given to you by the office. ? ?  Take 45 units of your night time insulin the night before your procedure. ? ?   DO NOT take any medications for diabetes the morning of your procedure. ?  ? ? Take these medicines the morning of surgery with A SIP OF WATER  ? ?    amlodipine, hydrocodone(if needed), metoprolol, flomax. ?  ? ? Do not wear jewelry, make-up or nail polish. ? Do not wear lotions, powders, or perfumes, or deodorant. ? Do not shave 48 hours prior to surgery.  Men may shave face and neck. ? Do not bring valuables to the hospital. ? [facility] is not responsible for any belongings or valuables. ? ?Contacts, dentures or bridgework may not be worn into surgery.  Leave your suitcase in the car.  After surgery it may be brought to your room. ? ?For patients admitted to the hospital, discharge time will be determined by your treatment team. ? ?Patients discharged the day of surgery will not be allowed to drive home and must have someone with them for 24 hours.  ? ? ?Special instructions:   DO NOT smoke tobacco or vape for 24 hours before your procedure. ? ?Please read over the following fact sheets that you were given. ?Anesthesia Post-op Instructions and Care and Recovery After Surgery ?  ? ? ? Colonoscopy, Adult, Care After ?This sheet gives you information about how to care for yourself after your procedure. Your health care provider may also give you more specific instructions. If you have problems or questions, contact your health care provider. ?What can I expect after the procedure? ?After the procedure, it is common to have: ?A small amount of blood in your stool for 24 hours after the procedure. ?Some gas. ?Mild cramping  or bloating of your abdomen. ?Follow these instructions at home: ?Eating and drinking ? ?Drink enough fluid to keep your urine pale yellow. ?Follow instructions from your health care provider about eating or drinking restrictions. ?Resume your normal diet as instructed by your health care provider. Avoid heavy or fried foods that are hard to digest. ?Activity ?Rest as told by your health care provider. ?Avoid sitting for a long time without moving. Get up to take short walks every 1-2 hours. This is important to improve blood flow and breathing. Ask for help if you feel weak or unsteady. ?Return to your normal activities as told by your health care provider. Ask your health care provider what activities are safe for you. ?Managing cramping and bloating ? ?Try walking around when you have cramps or feel bloated. ?Apply heat to your abdomen as told by your health care provider. Use the heat source that your health care provider recommends, such as a moist heat pack or a heating pad. ?Place a towel between your skin and the heat source. ?Leave the heat on for 20-30 minutes. ?Remove the heat if your skin turns bright red. This is especially important if you are unable to feel pain, heat, or cold. You may have a greater risk of getting burned. ?General instructions ?If you were given a sedative during the procedure, it  can affect you for several hours. Do not drive or operate machinery until your health care provider says that it is safe. ?For the first 24 hours after the procedure: ?Do not sign important documents. ?Do not drink alcohol. ?Do your regular daily activities at a slower pace than normal. ?Eat soft foods that are easy to digest. ?Take over-the-counter and prescription medicines only as told by your health care provider. ?Keep all follow-up visits as told by your health care provider. This is important. ?Contact a health care provider if: ?You have blood in your stool 2-3 days after the procedure. ?Get help  right away if you have: ?More than a small spotting of blood in your stool. ?Large blood clots in your stool. ?Swelling of your abdomen. ?Nausea or vomiting. ?A fever. ?Increasing pain in your abdomen that is not relieved with medicine. ?Summary ?After the procedure, it is common to have a small amount of blood in your stool. You may also have mild cramping and bloating of your abdomen. ?If you were given a sedative during the procedure, it can affect you for several hours. Do not drive or operate machinery until your health care provider says that it is safe. ?Get help right away if you have a lot of blood in your stool, nausea or vomiting, a fever, or increased pain in your abdomen. ?This information is not intended to replace advice given to you by your health care provider. Make sure you discuss any questions you have with your health care provider. ?Document Revised: 10/29/2018 Document Reviewed: 07/19/2018 ?Elsevier Patient Education ? [place]. ?Monitored Anesthesia Care, Care After ?This sheet gives you information about how to care for yourself after your procedure. Your health care provider may also give you more specific instructions. If you have problems or questions, contact your health care provider. ?What can I expect after the procedure? ?After the procedure, it is common to have: ?Tiredness. ?Forgetfulness about what happened after the procedure. ?Impaired judgment for important decisions. ?Nausea or vomiting. ?Some difficulty with balance. ?Follow these instructions at home: ?For the time period you were told by your health care provider: ?  ?Rest as needed. ?Do not participate in activities where you could fall or become injured. ?Do not drive or use machinery. ?Do not drink alcohol. ?Do not take sleeping pills or medicines that cause drowsiness. ?Do not make important decisions or sign legal documents. ?Do not take care of children on your own. ?Eating and drinking ?Follow the diet that  is recommended by your health care provider. ?Drink enough fluid to keep your urine pale yellow. ?If you vomit: ?Drink water, juice, or soup when you can drink without vomiting. ?Make sure you have little or no nausea before eating solid foods. ?General instructions ?Have a responsible adult stay with you for the time you are told. It is important to have someone help care for you until you are awake and alert. ?Take over-the-counter and prescription medicines only as told by your health care provider. ?If you have sleep apnea, surgery and certain medicines can increase your risk for breathing problems. Follow instructions from your health care provider about wearing your sleep device: ?Anytime you are sleeping, including during daytime naps. ?While taking prescription pain medicines, sleeping medicines, or medicines that make you drowsy. ?Avoid smoking. ?Keep all follow-up visits as told by your health care provider. This is important. ?Contact a health care provider if: ?You keep feeling nauseous or you keep vomiting. ?You feel light-headed. ?You are still  sleepy or having trouble with balance after 24 hours. ?You develop a rash. ?You have a fever. ?You have redness or swelling around the IV site. ?Get help right away if: ?You have trouble breathing. ?You have new-onset confusion at home. ?Summary ?For several hours after your procedure, you may feel tired. You may also be forgetful and have poor judgment. ?Have a responsible adult stay with you for the time you are told. It is important to have someone help care for you until you are awake and alert. ?Rest as told. Do not drive or operate machinery. Do not drink alcohol or take sleeping pills. ?Get help right away if you have trouble breathing, or if you suddenly become confused. ?This information is not intended to replace advice given to you by your health care provider. Make sure you discuss any questions you have with your health care provider. ?Document  Revised: 09/08/2019 Document Reviewed: 11/25/2018 ?Elsevier Patient Education ? [place]. ? ?

## 2021-03-07 ENCOUNTER — Telehealth: Payer: Self-pay | Admitting: Internal Medicine

## 2021-03-07 ENCOUNTER — Encounter (HOSPITAL_COMMUNITY)
Admission: RE | Admit: 2021-03-07 | Discharge: 2021-03-07 | Disposition: A | Discharge status: Home / Self Care | Payer: Medicare HMO | Source: Ambulatory Visit | Attending: Internal Medicine | Admitting: Internal Medicine

## 2021-03-07 VITALS — BP 132/58 | HR 65 | Temp 97.9°F | Resp 18 | Ht 69.0 in | Wt 235.0 lb

## 2021-03-07 DIAGNOSIS — D509 Iron deficiency anemia, unspecified: Secondary | ICD-10-CM | POA: Diagnosis not present

## 2021-03-07 DIAGNOSIS — Z01812 Encounter for preprocedural laboratory examination: Secondary | ICD-10-CM | POA: Insufficient documentation

## 2021-03-07 DIAGNOSIS — E0810 Diabetes mellitus due to underlying condition with ketoacidosis without coma: Secondary | ICD-10-CM | POA: Insufficient documentation

## 2021-03-07 MED ORDER — PEG 3350-KCL-NA BICARB-NACL 420 G PO SOLR: 4000.0000 mL | ORAL | 0 refills | Status: DC

## 2021-03-07 NOTE — Telephone Encounter (Signed)
Please send patient prep to his pharmacy again, he stated they "lost it" ?

## 2021-03-07 NOTE — Addendum Note (Signed)
Addended by: Zara Council C on: 03/07/2021 01:09 PM ? ? Modules accepted: Orders ? ?

## 2021-03-07 NOTE — Telephone Encounter (Signed)
Prep Rx resent to [facility]. Called and informed pt rx was resent. ?

## 2021-03-08 ENCOUNTER — Other Ambulatory Visit (HOSPITAL_COMMUNITY): Payer: Medicare HMO

## 2021-03-09 DIAGNOSIS — E1122 Type 2 diabetes mellitus with diabetic chronic kidney disease: Secondary | ICD-10-CM | POA: Diagnosis not present

## 2021-03-09 DIAGNOSIS — E1129 Type 2 diabetes mellitus with other diabetic kidney complication: Secondary | ICD-10-CM | POA: Diagnosis not present

## 2021-03-09 DIAGNOSIS — N189 Chronic kidney disease, unspecified: Secondary | ICD-10-CM | POA: Diagnosis not present

## 2021-03-09 DIAGNOSIS — R809 Proteinuria, unspecified: Secondary | ICD-10-CM | POA: Diagnosis not present

## 2021-03-09 DIAGNOSIS — Z6835 Body mass index (BMI) 35.0-35.9, adult: Secondary | ICD-10-CM | POA: Diagnosis not present

## 2021-03-09 DIAGNOSIS — I129 Hypertensive chronic kidney disease with stage 1 through stage 4 chronic kidney disease, or unspecified chronic kidney disease: Secondary | ICD-10-CM | POA: Diagnosis not present

## 2021-03-09 DIAGNOSIS — N200 Calculus of kidney: Secondary | ICD-10-CM | POA: Diagnosis not present

## 2021-03-11 ENCOUNTER — Ambulatory Visit (HOSPITAL_COMMUNITY): Payer: Medicare HMO | Admitting: Anesthesiology

## 2021-03-11 ENCOUNTER — Other Ambulatory Visit: Payer: Self-pay

## 2021-03-11 ENCOUNTER — Encounter (HOSPITAL_COMMUNITY): Payer: Self-pay

## 2021-03-11 ENCOUNTER — Ambulatory Visit (HOSPITAL_COMMUNITY)
Admission: RE | Admit: 2021-03-11 | Discharge: 2021-03-11 | Disposition: A | Discharge status: Home / Self Care | Payer: Medicare HMO | Attending: Internal Medicine | Admitting: Internal Medicine

## 2021-03-11 ENCOUNTER — Encounter (HOSPITAL_COMMUNITY)
Admission: RE | Disposition: A | Discharge status: Home / Self Care | Payer: Self-pay | Source: Home / Self Care | Attending: Internal Medicine

## 2021-03-11 ENCOUNTER — Ambulatory Visit (HOSPITAL_BASED_OUTPATIENT_CLINIC_OR_DEPARTMENT_OTHER): Payer: Medicare HMO | Admitting: Anesthesiology

## 2021-03-11 DIAGNOSIS — Z1211 Encounter for screening for malignant neoplasm of colon: Secondary | ICD-10-CM | POA: Insufficient documentation

## 2021-03-11 DIAGNOSIS — D123 Benign neoplasm of transverse colon: Secondary | ICD-10-CM | POA: Diagnosis not present

## 2021-03-11 DIAGNOSIS — N189 Chronic kidney disease, unspecified: Secondary | ICD-10-CM | POA: Diagnosis not present

## 2021-03-11 DIAGNOSIS — K5730 Diverticulosis of large intestine without perforation or abscess without bleeding: Secondary | ICD-10-CM

## 2021-03-11 DIAGNOSIS — D509 Iron deficiency anemia, unspecified: Secondary | ICD-10-CM | POA: Diagnosis not present

## 2021-03-11 DIAGNOSIS — K648 Other hemorrhoids: Secondary | ICD-10-CM | POA: Diagnosis not present

## 2021-03-11 DIAGNOSIS — D125 Benign neoplasm of sigmoid colon: Secondary | ICD-10-CM | POA: Insufficient documentation

## 2021-03-11 DIAGNOSIS — I2510 Atherosclerotic heart disease of native coronary artery without angina pectoris: Secondary | ICD-10-CM | POA: Insufficient documentation

## 2021-03-11 DIAGNOSIS — K219 Gastro-esophageal reflux disease without esophagitis: Secondary | ICD-10-CM | POA: Insufficient documentation

## 2021-03-11 DIAGNOSIS — E1122 Type 2 diabetes mellitus with diabetic chronic kidney disease: Secondary | ICD-10-CM | POA: Diagnosis not present

## 2021-03-11 DIAGNOSIS — Z86010 Personal history of colonic polyps: Secondary | ICD-10-CM | POA: Diagnosis not present

## 2021-03-11 DIAGNOSIS — Z800 Family history of malignant neoplasm of digestive organs: Secondary | ICD-10-CM | POA: Insufficient documentation

## 2021-03-11 DIAGNOSIS — I130 Hypertensive heart and chronic kidney disease with heart failure and stage 1 through stage 4 chronic kidney disease, or unspecified chronic kidney disease: Secondary | ICD-10-CM | POA: Diagnosis not present

## 2021-03-11 DIAGNOSIS — Z87891 Personal history of nicotine dependence: Secondary | ICD-10-CM | POA: Insufficient documentation

## 2021-03-11 DIAGNOSIS — I5032 Chronic diastolic (congestive) heart failure: Secondary | ICD-10-CM | POA: Diagnosis not present

## 2021-03-11 DIAGNOSIS — D120 Benign neoplasm of cecum: Secondary | ICD-10-CM | POA: Diagnosis not present

## 2021-03-11 DIAGNOSIS — K635 Polyp of colon: Secondary | ICD-10-CM | POA: Diagnosis not present

## 2021-03-11 DIAGNOSIS — E7800 Pure hypercholesterolemia, unspecified: Secondary | ICD-10-CM | POA: Insufficient documentation

## 2021-03-11 HISTORY — PX: COLONOSCOPY WITH PROPOFOL: SHX5780

## 2021-03-11 HISTORY — PX: POLYPECTOMY: SHX5525

## 2021-03-11 HISTORY — PX: BIOPSY: SHX5522

## 2021-03-11 LAB — GLUCOSE, CAPILLARY: Glucose-Capillary: 143 mg/dL — ABNORMAL HIGH (ref 70–99)

## 2021-03-11 SURGERY — COLONOSCOPY WITH PROPOFOL
Anesthesia: General

## 2021-03-11 MED ORDER — EPHEDRINE 5 MG/ML INJ
INTRAVENOUS | Status: AC
  Filled 2021-03-11: qty 5

## 2021-03-11 MED ORDER — LACTATED RINGERS IV SOLN: INTRAVENOUS | Status: DC | PRN

## 2021-03-11 MED ORDER — EPHEDRINE SULFATE-NACL 50-0.9 MG/10ML-% IV SOSY
PREFILLED_SYRINGE | INTRAVENOUS | Status: DC | PRN
  Administered 2021-03-11: 5 mg via INTRAVENOUS

## 2021-03-11 MED ORDER — LIDOCAINE HCL (CARDIAC) PF 100 MG/5ML IV SOSY
PREFILLED_SYRINGE | INTRAVENOUS | Status: DC | PRN
Start: 2021-03-11 — End: 2021-03-11
  Administered 2021-03-11: 50 mg via INTRAVENOUS

## 2021-03-11 MED ORDER — LACTATED RINGERS IV SOLN: INTRAVENOUS | Status: DC

## 2021-03-11 MED ORDER — PROPOFOL 10 MG/ML IV BOLUS
INTRAVENOUS | Status: DC | PRN
  Administered 2021-03-11 (×2): 50 mg via INTRAVENOUS
  Administered 2021-03-11: 100 mg via INTRAVENOUS
  Administered 2021-03-11: 50 mg via INTRAVENOUS

## 2021-03-11 NOTE — Anesthesia Preprocedure Evaluation (Signed)
Anesthesia Evaluation  ?Patient identified by MRN, date of birth, ID band ?Patient awake ? ? ? ?Reviewed: ?Allergy & Precautions, H&P , NPO status , Patient's Chart, lab work & pertinent test results, reviewed documented beta blocker date and time  ? ?Airway ?Mallampati: II ? ?TM Distance: >3 FB ?Neck ROM: full ? ? ? Dental ?no notable dental hx. ? ?  ?Pulmonary ?neg pulmonary ROS, former smoker,  ?  ?Pulmonary exam normal ?breath sounds clear to auscultation ? ? ? ? ? ? Cardiovascular ?Exercise Tolerance: Good ?hypertension, + CAD  ?+ Valvular Problems/Murmurs AS  ?Rhythm:regular Rate:Normal ? ? ?  ?Neuro/Psych ?PSYCHIATRIC DISORDERS Anxiety negative neurological ROS ?   ? GI/Hepatic ?Neg liver ROS, GERD  Medicated,  ?Endo/Other  ?negative endocrine ROSdiabetes ? Renal/GU ?CRFRenal disease  ?negative genitourinary ?  ?Musculoskeletal ? ? Abdominal ?  ?Peds ? Hematology ? ?(+) Blood dyscrasia, anemia ,   ?Anesthesia Other Findings ??1. Left ventricular ejection fraction, by estimation, is 60 to 65%. The  ?left ventricle has normal function. The left ventricle has no regional  ?wall motion abnormalities. There is moderate left ventricular hypertrophy.  ?Left ventricular diastolic  ?parameters are consistent with Grade I diastolic dysfunction (impaired  ?relaxation). Elevated left atrial pressure.  ??2. Right ventricular systolic function is normal. The right ventricular  ?size is normal. Tricuspid regurgitation signal is inadequate for assessing  ?PA pressure.  ??3. The mitral valve is abnormal. Mild mitral valve regurgitation. No  ?evidence of mitral stenosis.  ??4. The aortic valve has an indeterminant number of cusps. There is  ?moderate calcification of the aortic valve. There is moderate thickening  ?of the aortic valve. Aortic valve regurgitation is not visualized.  ?Moderate aortic valve stenosis.  ??5. The inferior vena cava is normal in size with greater than 50%   ?respiratory variability, suggesting right atrial pressure of 3 mmHg.  ? Reproductive/Obstetrics ?negative OB ROS ? ?  ? ? ? ? ? ? ? ? ? ? ? ? ? ?  ?  ? ? ? ? ? ? ? ? ?Anesthesia Physical ?Anesthesia Plan ? ?ASA: 3 ? ?Anesthesia Plan: General  ? ?Post-op Pain Management:   ? ?Induction:  ? ?PONV Risk Score and Plan: Propofol infusion ? ?Airway Management Planned:  ? ?Additional Equipment:  ? ?Intra-op Plan:  ? ?Post-operative Plan:  ? ?Informed Consent: I have reviewed the patients History and Physical, chart, labs and discussed the procedure including the risks, benefits and alternatives for the proposed anesthesia with the patient or authorized representative who has indicated his/her understanding and acceptance.  ? ? ? ?Dental Advisory Given ? ?Plan Discussed with: CRNA ? ?Anesthesia Plan Comments:   ? ? ? ? ? ? ?Anesthesia Quick Evaluation ? ?

## 2021-03-11 NOTE — Op Note (Signed)
[facility] ?Patient Name: William [name] ?Procedure Date: 03/11/2021 8:31 AM ?MRN: [id number] ?Date of Birth: 1945/07/17 ?Attending MD: Elon Alas. Abbey Chatters , DO ?CSN: [id number] ?Age: 76 ?Admit Type: Outpatient ?Procedure:                Colonoscopy ?Indications:              High risk colon cancer surveillance: Personal  ?                          history of colonic polyps ?Providers:                Elon Alas. Abbey Chatters, DO, Hughie Closs RN, RN, Janett Billow  ?                          Boudreaux, Randa Spike, Technician ?Referring MD:              ?Medicines:                See the Anesthesia note for documentation of the  ?                          administered medications ?Complications:            No immediate complications. ?Estimated Blood Loss:     Estimated blood loss was minimal. ?Procedure:                Pre-Anesthesia Assessment: ?                          - The anesthesia plan was to use monitored  ?                          anesthesia care (MAC). ?                          After obtaining informed consent, the colonoscope  ?                          was passed under direct vision. Throughout the  ?                          procedure, the patient's blood pressure, pulse, and  ?                          oxygen saturations were monitored continuously. The  ?                          PCF-HQ190L ([id number]) scope was introduced through  ?                          the anus and advanced to the the cecum, identified  ?                          by appendiceal orifice and ileocecal valve. The  ?                          colonoscopy was performed without  difficulty. The  ?                          patient tolerated the procedure well. The quality  ?                          of the bowel preparation was evaluated using the  ?                          BBPS [facility] Bowel Preparation Scale) with scores  ?                          of: Right Colon = 2 (minor amount of residual  ?                          staining, small  fragments of stool and/or opaque  ?                          liquid, but mucosa seen well), Transverse Colon = 3  ?                          (entire mucosa seen well with no residual staining,  ?                          small fragments of stool or opaque liquid) and Left  ?                          Colon = 3 (entire mucosa seen well with no residual  ?                          staining, small fragments of stool or opaque  ?                          liquid). The total BBPS score equals 8. The quality  ?                          of the bowel preparation was good. ?Scope In: 9:26:31 AM ?Scope Out: 9:51:39 AM ?Scope Withdrawal Time: 0 hours 15 minutes 52 seconds  ?Total Procedure Duration: 0 hours 25 minutes 8 seconds  ?Findings: ?     The perianal and digital rectal examinations were normal. ?     Non-bleeding internal hemorrhoids were found during endoscopy. ?     Multiple small and large-mouthed diverticula were found in the sigmoid  ?     colon, descending colon and transverse colon. ?     A 2 mm polyp was found in the cecum. The polyp was sessile. The polyp  ?     was removed with a cold biopsy forceps. Resection and retrieval were  ?     complete. ?     Three sessile polyps were found in the sigmoid colon and transverse  ?     colon. The polyps were 5 to 7 mm in size. These polyps were removed with  ?     a cold snare. Resection and retrieval were complete. ?  The exam was otherwise without abnormality. ?Impression:               - Non-bleeding internal hemorrhoids. ?                          - Diverticulosis in the sigmoid colon, in the  ?                          descending colon and in the transverse colon. ?                          - One 2 mm polyp in the cecum, removed with a cold  ?                          biopsy forceps. Resected and retrieved. ?                          - Three 5 to 7 mm polyps in the sigmoid colon and  ?                          in the transverse colon, removed with a cold snare.  ?                           Resected and retrieved. ?                          - The examination was otherwise normal. ?Moderate Sedation: ?     Per Anesthesia Care ?Recommendation:           - Patient has a contact number available for  ?                          emergencies. The signs and symptoms of potential  ?                          delayed complications were discussed with the  ?                          patient. Return to normal activities tomorrow.  ?                          Written discharge instructions were provided to the  ?                          patient. ?                          - Resume previous diet. ?                          - Continue present medications. ?                          - Await pathology results. ?                          -  Return to GI clinic PRN. ?                          - Repeat colonoscopy in 3 years for surveillance. ?Procedure Code(s):        --- Professional --- ?                          [phone], Colonoscopy, flexible; with removal of  ?                          tumor(s), polyp(s), or other lesion(s) by snare  ?                          technique ?                          45380, 59, Colonoscopy, flexible; with biopsy,  ?                          single or multiple ?Diagnosis Code(s):        --- Professional --- ?                          K63.5, Polyp of colon ?                          Z86.010, Personal history of colonic polyps ?                          K64.8, Other hemorrhoids ?                          K57.30, Diverticulosis of large intestine without  ?                          perforation or abscess without bleeding ?CPT copyright 2019 American Medical Association. All rights reserved. ?The codes documented in this report are preliminary and upon coder review may  ?be revised to meet current compliance requirements. ?Elon Alas. Abbey Chatters, DO ?Elon Alas. Hepburn, DO ?03/11/2021 9:55:10 AM ?This report has been signed electronically. ?Number of Addenda: 0 ?

## 2021-03-11 NOTE — Anesthesia Postprocedure Evaluation (Signed)
Anesthesia Post Note ? ?Patient: William [name] ? ?Procedure(s) Performed: COLONOSCOPY WITH PROPOFOL ?POLYPECTOMY ?BIOPSY ? ?Patient location during evaluation: Phase II ?Anesthesia Type: General ?Level of consciousness: awake ?Pain management: pain level controlled ?Vital Signs Assessment: post-procedure vital signs reviewed and stable ?Respiratory status: spontaneous breathing and respiratory function stable ?Cardiovascular status: blood pressure returned to baseline and stable ?Postop Assessment: no headache and no apparent nausea or vomiting ?Anesthetic complications: no ?Comments: Late entry ? ? ?No notable events documented. ? ? ?Last Vitals:  ?Vitals:  ? 03/11/21 0954 03/11/21 1000  ?BP: (!) 95/54 110/64  ?Pulse: (!) 58 (!) 57  ?Resp: 19 18  ?Temp: 36.6 ?C   ?SpO2: 96% 97%  ?  ?Last Pain:  ?Vitals:  ? 03/11/21 1000  ?TempSrc:   ?PainSc: 0-No pain  ? ? ?  ?  ?  ?  ?  ?  ? ?Louann Sjogren ? ? ? ? ?

## 2021-03-11 NOTE — Interval H&P Note (Signed)
History and Physical Interval Note: ? ?03/11/2021 ?8:32 AM ? ?William [name]  has presented today for surgery, with the diagnosis of history of colon polyps, family history of colon cancer.  The various methods of treatment have been discussed with the patient and family. After consideration of risks, benefits and other options for treatment, the patient has consented to  Procedure(s) with comments: ?COLONOSCOPY WITH PROPOFOL (N/A) - 9:15am as a surgical intervention.  The patient's history has been reviewed, patient examined, no change in status, stable for surgery.  I have reviewed the patient's chart and labs.  Questions were answered to the patient's satisfaction.   ? ? ?[name] ? ? ?

## 2021-03-11 NOTE — Discharge Instructions (Signed)
?  Colonoscopy ?Discharge Instructions ? ?Read the instructions outlined below and refer to this sheet in the next few weeks. These discharge instructions provide you with general information on caring for yourself after you leave the hospital. Your doctor may also give you specific instructions. While your treatment has been planned according to the most current medical practices available, unavoidable complications occasionally occur.  ? ?ACTIVITY ?You may resume your regular activity, but move at a slower pace for the next 24 hours.  ?Take frequent rest periods for the next 24 hours.  ?Walking will help get rid of the air and reduce the bloated feeling in your belly (abdomen).  ?No driving for 24 hours (because of the medicine (anesthesia) used during the test).   ?Do not sign any important legal documents or operate any machinery for 24 hours (because of the anesthesia used during the test).  ?NUTRITION ?Drink plenty of fluids.  ?You may resume your normal diet as instructed by your doctor.  ?Begin with a light meal and progress to your normal diet. Heavy or fried foods are harder to digest and may make you feel sick to your stomach (nauseated).  ?Avoid alcoholic beverages for 24 hours or as instructed.  ?MEDICATIONS ?You may resume your normal medications unless your doctor tells you otherwise.  ?WHAT YOU CAN EXPECT TODAY ?Some feelings of bloating in the abdomen.  ?Passage of more gas than usual.  ?Spotting of blood in your stool or on the toilet paper.  ?IF YOU HAD POLYPS REMOVED DURING THE COLONOSCOPY: ?No aspirin products for 7 days or as instructed.  ?No alcohol for 7 days or as instructed.  ?Eat a soft diet for the next 24 hours.  ?FINDING OUT THE RESULTS OF YOUR TEST ?Not all test results are available during your visit. If your test results are not back during the visit, make an appointment with your caregiver to find out the results. Do not assume everything is normal if you have not heard from your  caregiver or the medical facility. It is important for you to follow up on all of your test results.  ?SEEK IMMEDIATE MEDICAL ATTENTION IF: ?You have more than a spotting of blood in your stool.  ?Your belly is swollen (abdominal distention).  ?You are nauseated or vomiting.  ?You have a temperature over 101.  ?You have abdominal pain or discomfort that is severe or gets worse throughout the day.  ? ?Your colonoscopy revealed 4 polyp(s) which I removed successfully. Await pathology results, my office will contact you. I recommend repeating colonoscopy in 3 years for surveillance purposes.  ?You also have diverticulosis and internal hemorrhoids. I would recommend increasing fiber in your diet or adding OTC Benefiber/Metamucil. Be sure to drink at least 4 to 6 glasses of water daily. Follow-up with GI as needed. ? ? ?I hope you have a great rest of your week! ? ?Elon Alas. Abbey Chatters, D.O. ?Gastroenterology and Hepatology ?[facility] ? ?

## 2021-03-11 NOTE — Anesthesia Procedure Notes (Signed)
Date/Time: 03/11/2021 9:29 AM ?Performed by: Orlie Dakin, CRNA ?Pre-anesthesia Checklist: Patient identified, Emergency Drugs available, Suction available and Patient being monitored ?Patient Re-evaluated:Patient Re-evaluated prior to induction ?Oxygen Delivery Method: Non-rebreather mask ?Induction Type: IV induction ?Placement Confirmation: positive ETCO2 ? ? ? ? ?

## 2021-03-11 NOTE — Transfer of Care (Signed)
Immediate Anesthesia Transfer of Care Note ? ?Patient: William [name] ? ?Procedure(s) Performed: COLONOSCOPY WITH PROPOFOL ?POLYPECTOMY ?BIOPSY ? ?Patient Location: Short Stay ? ?Anesthesia Type:General ? ?Level of Consciousness: drowsy ? ?Airway & Oxygen Therapy: Patient Spontanous Breathing ? ?Post-op Assessment: Report given to RN and Post -op Vital signs reviewed and stable ? ?Post vital signs: Reviewed and stable ? ?Last Vitals:  ?Vitals Value Taken Time  ?BP    ?Temp    ?Pulse    ?Resp    ?SpO2    ? ? ?Last Pain:  ?Vitals:  ? 03/11/21 0923  ?TempSrc:   ?PainSc: 0-No pain  ?   ? ?  ? ?Complications: No notable events documented. ?

## 2021-03-12 LAB — SURGICAL PATHOLOGY

## 2021-03-13 NOTE — Addendum Note (Signed)
Addendum  created 03/13/21 1216 by Orlie Dakin, CRNA  ? Intraprocedure Staff edited  ?  ?

## 2021-03-14 ENCOUNTER — Encounter (HOSPITAL_COMMUNITY): Payer: Self-pay | Admitting: Internal Medicine

## 2021-03-14 DIAGNOSIS — S39012A Strain of muscle, fascia and tendon of lower back, initial encounter: Secondary | ICD-10-CM | POA: Diagnosis not present

## 2021-03-14 DIAGNOSIS — E6609 Other obesity due to excess calories: Secondary | ICD-10-CM | POA: Diagnosis not present

## 2021-03-14 DIAGNOSIS — M150 Primary generalized (osteo)arthritis: Secondary | ICD-10-CM | POA: Diagnosis not present

## 2021-03-14 DIAGNOSIS — Z6834 Body mass index (BMI) 34.0-34.9, adult: Secondary | ICD-10-CM | POA: Diagnosis not present

## 2021-03-14 DIAGNOSIS — G894 Chronic pain syndrome: Secondary | ICD-10-CM | POA: Diagnosis not present

## 2021-04-04 ENCOUNTER — Ambulatory Visit (HOSPITAL_COMMUNITY)
Admission: RE | Admit: 2021-04-04 | Discharge: 2021-04-04 | Disposition: A | Discharge status: Home / Self Care | Payer: Medicare HMO | Source: Ambulatory Visit | Attending: "Endocrinology | Admitting: "Endocrinology

## 2021-04-04 DIAGNOSIS — I700 Atherosclerosis of aorta: Secondary | ICD-10-CM | POA: Insufficient documentation

## 2021-04-04 DIAGNOSIS — Z13820 Encounter for screening for osteoporosis: Secondary | ICD-10-CM | POA: Diagnosis not present

## 2021-04-04 DIAGNOSIS — F17210 Nicotine dependence, cigarettes, uncomplicated: Secondary | ICD-10-CM | POA: Diagnosis not present

## 2021-04-04 DIAGNOSIS — Z87891 Personal history of nicotine dependence: Secondary | ICD-10-CM | POA: Diagnosis not present

## 2021-04-04 DIAGNOSIS — I2510 Atherosclerotic heart disease of native coronary artery without angina pectoris: Secondary | ICD-10-CM | POA: Diagnosis not present

## 2021-04-08 ENCOUNTER — Ambulatory Visit: Payer: Medicare HMO | Admitting: "Endocrinology

## 2021-04-15 ENCOUNTER — Ambulatory Visit: Payer: Medicare HMO | Admitting: "Endocrinology

## 2021-04-15 ENCOUNTER — Encounter: Payer: Self-pay | Admitting: "Endocrinology

## 2021-04-15 VITALS — BP 136/68 | HR 64 | Ht 69.0 in | Wt 231.4 lb

## 2021-04-15 DIAGNOSIS — M810 Age-related osteoporosis without current pathological fracture: Secondary | ICD-10-CM | POA: Diagnosis not present

## 2021-04-15 DIAGNOSIS — E212 Other hyperparathyroidism: Secondary | ICD-10-CM | POA: Diagnosis not present

## 2021-04-15 MED ORDER — ALENDRONATE SODIUM 70 MG PO TABS: 70.0000 mg | ORAL_TABLET | ORAL | 11 refills | Status: AC
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
## 2021-04-16 DIAGNOSIS — E6609 Other obesity due to excess calories: Secondary | ICD-10-CM | POA: Diagnosis not present

## 2021-04-16 DIAGNOSIS — M1991 Primary osteoarthritis, unspecified site: Secondary | ICD-10-CM | POA: Diagnosis not present

## 2021-04-16 DIAGNOSIS — G894 Chronic pain syndrome: Secondary | ICD-10-CM | POA: Diagnosis not present

## 2021-04-16 DIAGNOSIS — M150 Primary generalized (osteo)arthritis: Secondary | ICD-10-CM | POA: Diagnosis not present

## 2021-04-16 DIAGNOSIS — Z6834 Body mass index (BMI) 34.0-34.9, adult: Secondary | ICD-10-CM | POA: Diagnosis not present

## 2021-04-24 ENCOUNTER — Ambulatory Visit (HOSPITAL_COMMUNITY)
Admission: RE | Admit: 2021-04-24 | Discharge: 2021-04-24 | Disposition: A | Discharge status: Home / Self Care | Payer: Medicare HMO | Source: Ambulatory Visit | Attending: Urology | Admitting: Urology

## 2021-04-24 DIAGNOSIS — N200 Calculus of kidney: Secondary | ICD-10-CM | POA: Diagnosis not present

## 2021-04-24 DIAGNOSIS — R109 Unspecified abdominal pain: Secondary | ICD-10-CM | POA: Diagnosis not present

## 2021-04-26 ENCOUNTER — Ambulatory Visit: Payer: Medicare HMO | Admitting: Urology

## 2021-04-29 ENCOUNTER — Encounter: Payer: Self-pay | Admitting: Urology

## 2021-04-29 ENCOUNTER — Ambulatory Visit: Payer: Medicare HMO | Admitting: Urology

## 2021-04-29 VITALS — BP 117/67 | HR 70

## 2021-04-29 DIAGNOSIS — R351 Nocturia: Secondary | ICD-10-CM | POA: Diagnosis not present

## 2021-04-29 DIAGNOSIS — Z87442 Personal history of urinary calculi: Secondary | ICD-10-CM

## 2021-04-29 DIAGNOSIS — N400 Enlarged prostate without lower urinary tract symptoms: Secondary | ICD-10-CM

## 2021-04-29 DIAGNOSIS — N401 Enlarged prostate with lower urinary tract symptoms: Secondary | ICD-10-CM

## 2021-04-29 DIAGNOSIS — N200 Calculus of kidney: Secondary | ICD-10-CM

## 2021-04-29 DIAGNOSIS — R3912 Poor urinary stream: Secondary | ICD-10-CM

## 2021-04-29 LAB — URINALYSIS, ROUTINE W REFLEX MICROSCOPIC
Bilirubin, UA: NEGATIVE
Ketones, UA: NEGATIVE
Leukocytes,UA: NEGATIVE
Nitrite, UA: NEGATIVE
RBC, UA: NEGATIVE
Specific Gravity, UA: 1.02 (ref 1.005–1.030)
Urobilinogen, Ur: 0.2 mg/dL (ref 0.2–1.0)
pH, UA: 5.5 (ref 5.0–7.5)

## 2021-04-29 LAB — MICROSCOPIC EXAMINATION
Bacteria, UA: NONE SEEN
Epithelial Cells (non renal): NONE SEEN /hpf (ref 0–10)
RBC, Urine: NONE SEEN /hpf (ref 0–2)
Renal Epithel, UA: NONE SEEN /hpf
WBC, UA: NONE SEEN /hpf (ref 0–5)

## 2021-04-29 MED ORDER — ALFUZOSIN HCL ER 10 MG PO TB24: 10.0000 mg | ORAL_TABLET | Freq: Every day | ORAL | 11 refills | Status: DC
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
## 2021-04-29 NOTE — Patient Instructions (Signed)

## 2021-05-06 ENCOUNTER — Telehealth: Payer: Self-pay | Admitting: Urology

## 2021-05-06 NOTE — Telephone Encounter (Signed)
[place] states that patient has an active prescription (filled 03/28/21) for tamsulosin ordered by Dr. Collene Mares. ? ?Do you want patient to continue with your prescription for alfuzosin?  Should he discontinue tamsulosin? ? ?Pharmacy needs a return call before filling the prescription at [phone]. ? ?Please advise. ?

## 2021-05-06 NOTE — Telephone Encounter (Signed)
See below

## 2021-05-07 NOTE — Progress Notes (Signed)
Aware via letter ?

## 2021-05-07 NOTE — Progress Notes (Signed)
Opened in error

## 2021-05-08 NOTE — Telephone Encounter (Signed)
Pharmacy called and informed to discontinue the Flomax and continue the Uroxatral. ?

## 2021-05-16 DIAGNOSIS — E6609 Other obesity due to excess calories: Secondary | ICD-10-CM | POA: Diagnosis not present

## 2021-05-16 DIAGNOSIS — G894 Chronic pain syndrome: Secondary | ICD-10-CM | POA: Diagnosis not present

## 2021-05-16 DIAGNOSIS — M150 Primary generalized (osteo)arthritis: Secondary | ICD-10-CM | POA: Diagnosis not present

## 2021-05-16 DIAGNOSIS — Z6833 Body mass index (BMI) 33.0-33.9, adult: Secondary | ICD-10-CM | POA: Diagnosis not present

## 2021-05-16 DIAGNOSIS — E1140 Type 2 diabetes mellitus with diabetic neuropathy, unspecified: Secondary | ICD-10-CM | POA: Diagnosis not present

## 2021-05-30 ENCOUNTER — Encounter: Payer: Self-pay | Admitting: Internal Medicine

## 2021-06-04 DIAGNOSIS — I129 Hypertensive chronic kidney disease with stage 1 through stage 4 chronic kidney disease, or unspecified chronic kidney disease: Secondary | ICD-10-CM | POA: Diagnosis not present

## 2021-06-04 DIAGNOSIS — R809 Proteinuria, unspecified: Secondary | ICD-10-CM | POA: Diagnosis not present

## 2021-06-04 DIAGNOSIS — N182 Chronic kidney disease, stage 2 (mild): Secondary | ICD-10-CM | POA: Diagnosis not present

## 2021-06-04 DIAGNOSIS — Z6835 Body mass index (BMI) 35.0-35.9, adult: Secondary | ICD-10-CM | POA: Diagnosis not present

## 2021-06-13 DIAGNOSIS — N189 Chronic kidney disease, unspecified: Secondary | ICD-10-CM | POA: Diagnosis not present

## 2021-06-13 DIAGNOSIS — N1831 Chronic kidney disease, stage 3a: Secondary | ICD-10-CM | POA: Diagnosis not present

## 2021-06-13 DIAGNOSIS — N200 Calculus of kidney: Secondary | ICD-10-CM | POA: Diagnosis not present

## 2021-06-13 DIAGNOSIS — E1129 Type 2 diabetes mellitus with other diabetic kidney complication: Secondary | ICD-10-CM | POA: Diagnosis not present

## 2021-06-13 DIAGNOSIS — R809 Proteinuria, unspecified: Secondary | ICD-10-CM | POA: Diagnosis not present

## 2021-06-13 DIAGNOSIS — E1122 Type 2 diabetes mellitus with diabetic chronic kidney disease: Secondary | ICD-10-CM | POA: Diagnosis not present

## 2021-06-19 ENCOUNTER — Ambulatory Visit (HOSPITAL_COMMUNITY)
Admission: RE | Admit: 2021-06-19 | Discharge: 2021-06-19 | Disposition: A | Discharge status: Home / Self Care | Payer: Medicare HMO | Source: Ambulatory Visit | Attending: Cardiovascular Disease | Admitting: Cardiovascular Disease

## 2021-06-19 DIAGNOSIS — I350 Nonrheumatic aortic (valve) stenosis: Secondary | ICD-10-CM | POA: Diagnosis not present

## 2021-06-19 LAB — ECHOCARDIOGRAM COMPLETE
AR max vel: 0.62 cm2
AV Area VTI: 0.61 cm2
AV Area mean vel: 0.54 cm2
AV Mean grad: 41.5 mmHg
AV Peak grad: 66.4 mmHg
Ao pk vel: 4.08 m/s
Area-P 1/2: 3.48 cm2
MV M vel: 6.06 m/s
MV Peak grad: 146.9 mmHg
Radius: 0.6 cm
S' Lateral: 3.4 cm

## 2021-06-19 NOTE — Progress Notes (Signed)
*  PRELIMINARY RESULTS* Echocardiogram 2D Echocardiogram has been performed.  William [name] 06/19/2021, 9:37 AM

## 2021-06-20 DIAGNOSIS — E113293 Type 2 diabetes mellitus with mild nonproliferative diabetic retinopathy without macular edema, bilateral: Secondary | ICD-10-CM | POA: Diagnosis not present
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
## 2021-06-27 ENCOUNTER — Encounter: Payer: Self-pay | Admitting: Student

## 2021-06-27 ENCOUNTER — Telehealth: Payer: Self-pay | Admitting: Student

## 2021-06-27 ENCOUNTER — Ambulatory Visit: Payer: Medicare HMO | Admitting: Student

## 2021-06-27 VITALS — BP 130/66 | HR 72 | Ht 69.0 in | Wt 235.0 lb

## 2021-06-27 DIAGNOSIS — N182 Chronic kidney disease, stage 2 (mild): Secondary | ICD-10-CM

## 2021-06-27 DIAGNOSIS — I350 Nonrheumatic aortic (valve) stenosis: Secondary | ICD-10-CM | POA: Diagnosis not present

## 2021-06-27 DIAGNOSIS — I10 Essential (primary) hypertension: Secondary | ICD-10-CM

## 2021-06-27 DIAGNOSIS — I2510 Atherosclerotic heart disease of native coronary artery without angina pectoris: Secondary | ICD-10-CM

## 2021-06-27 DIAGNOSIS — E785 Hyperlipidemia, unspecified: Secondary | ICD-10-CM

## 2021-06-27 NOTE — Telephone Encounter (Signed)
PERCERT:  CATHETERIZATION  07-15-2021

## 2021-06-27 NOTE — Patient Instructions (Signed)
Medication Instructions:  Your physician recommends that you continue on your current medications as directed. Please refer to the Current Medication list given to you today.  *If you need a refill on your cardiac medications before your next appointment, please call your pharmacy*   Lab Work: Your physician recommends that you return for lab work in: BMET, CBC   If you have labs (blood work) drawn today and your tests are completely normal, you will receive your results only by: MyChart Message (if you have MyChart) OR A paper copy in the mail If you have any lab test that is abnormal or we need to change your treatment, we will call you to review the results.   Testing/Procedures: Your physician has requested that you have a cardiac catheterization. Cardiac catheterization is used to diagnose and/or treat various heart conditions. Doctors may recommend this procedure for a number of different reasons. The most common reason is to evaluate chest pain. Chest pain can be a symptom of coronary artery disease (CAD), and cardiac catheterization can show whether plaque is narrowing or blocking your heart's arteries. This procedure is also used to evaluate the valves, as well as measure the blood flow and oxygen levels in different parts of your heart. For further information please visit HugeFiesta.[email or web address]. Please follow instruction sheet, as given.    Follow-Up: At [facility], you and your health needs are our priority.  As part of our continuing mission to provide you with exceptional heart care, we have created designated Provider Care Teams.  These Care Teams include your primary Cardiologist (physician) and Advanced Practice Providers (APPs -  Physician Assistants and Nurse Practitioners) who all work together to provide you with the care you need, when you need it.  We recommend signing up for the patient portal called "MyChart".  Sign up information is provided on this After Visit  Summary.  MyChart is used to connect with patients for Virtual Visits (Telemedicine).  Patients are able to view lab/test results, encounter notes, upcoming appointments, etc.  Non-urgent messages can be sent to your provider as well.   To learn more about what you can do with MyChart, go to [email or web address].    Your next appointment:   3-4 month(s)  The format for your next appointment:   In Person  Provider:   [name], MD    Other Instructions Thank you for choosing [facility]!    [place] [address] Dept: [phone] Loc: [place]  06/27/2021  You are scheduled for a Cardiac Catheterization on Monday, July 10 with Dr. [name].  1. Please arrive at the Main Entrance A at [facility]: [place], [place] [address] at 7:00 AM (This time is two hours before your procedure to ensure your preparation). Free valet parking service is available.   Special note: Every effort is made to have your procedure done on time. Please understand that emergencies sometimes delay scheduled procedures.  2. Diet: Do not eat solid foods after midnight.  You may have clear liquids until 5 AM upon the day of the procedure.  3. Labs: You will need to have blood drawn before , July 10 at [employer] do not need to be fasting.  4. Medication instructions in preparation for your procedure:   Contrast Allergy: No  Take only 45 units of insulin the night before your procedure. Do not  take any insulin on the day of the procedure.  Do not take Glipizide or Lasix the morning of your procedure   On the morning of your procedure, take Aspirin and any morning medicines NOT listed above.  You may use sips of water.  5. Plan to go home the same day, you will only stay overnight if medically necessary. 6. You MUST have a responsible  adult to drive you home. 7. An adult MUST be with you the first 24 hours after you arrive home. 8. Bring a current list of your medications, and the last time and date medication taken. 9. Bring ID and current insurance cards. 10.Please wear clothes that are easy to get on and off and wear slip-on shoes.  Thank you for allowing [place] to care for you!   -- [place] [facility]   Important Information About Sugar

## 2021-07-08 ENCOUNTER — Ambulatory Visit: Payer: Medicare HMO | Admitting: Cardiovascular Disease

## 2021-07-10 ENCOUNTER — Ambulatory Visit: Payer: Medicare HMO | Admitting: Cardiovascular Disease

## 2021-07-10 DIAGNOSIS — I350 Nonrheumatic aortic (valve) stenosis: Secondary | ICD-10-CM | POA: Diagnosis not present

## 2021-07-11 ENCOUNTER — Telehealth: Payer: Self-pay

## 2021-07-11 ENCOUNTER — Telehealth: Payer: Self-pay | Admitting: *Deleted

## 2021-07-11 LAB — BASIC METABOLIC PANEL
BUN/Creatinine Ratio: 11 (ref 10–24)
BUN: 15 mg/dL (ref 8–27)
CO2: 20 mmol/L (ref 20–29)
Calcium: 10.3 mg/dL — ABNORMAL HIGH (ref 8.6–10.2)
Chloride: 102 mmol/L (ref 96–106)
Creatinine, Ser: 1.4 mg/dL — ABNORMAL HIGH (ref 0.76–1.27)
Glucose: 157 mg/dL — ABNORMAL HIGH (ref 70–99)
Potassium: 4.6 mmol/L (ref 3.5–5.2)
Sodium: 142 mmol/L (ref 134–144)
eGFR: 53 mL/min/{1.73_m2} — ABNORMAL LOW (ref >59)

## 2021-07-11 LAB — CBC
Hematocrit: 38.9 % (ref 37.5–51.0)
Hemoglobin: 13.2 g/dL (ref 13.0–17.7)
MCH: 30.7 pg (ref 26.6–33.0)
MCHC: 33.9 g/dL (ref 31.5–35.7)
MCV: 91 fL (ref 79–97)
Platelets: 231 10*3/uL (ref 150–450)
RBC: 4.3 x10E6/uL (ref 4.14–5.80)
RDW: 14.4 % (ref 11.6–15.4)
WBC: 10.3 10*3/uL (ref 3.4–10.8)

## 2021-07-11 NOTE — Telephone Encounter (Signed)
-----   Message from Erma Heritage, [place] sent at 07/11/2021  7:45 AM EDT ----- Please let the patient know his hemoglobin and platelets are within a normal range. Electrolytes normal. Kidney function has slightly worsened since prior labs with creatinine at 1.40. Would recommend holding his Lasix the evening before and morning of his catheterization. Also hold Lisinopril the morning of his cath.

## 2021-07-11 NOTE — Telephone Encounter (Signed)
Cardiac Catheterization scheduled at [facility] for: Monday July 15, 2021 9 AM Arrival time and place: [facility] Main Entrance A at: 7 AM    Nothing to eat after midnight prior to procedure, clear liquids until 5 AM day of procedure.  Medication instructions: -Hold:  Glipizide/Farxiga-AM of procedure  Metformin -day of procedure and 48 hours post procedure  Insulin-1/2 usual dose HS prior to procedure  Lasix-PM prior/AM of procedure-per protocol -GFR 53  Lisinopril-PM prior to procedure/day of procedure-per protocol GFR 53 -Except hold medications usual morning medications can be taken with sips of water including aspirin 81 mg.  Confirmed patient has responsible adult to drive home post procedure and be with patient first 24 hours after arriving home.  Patient reports no new symptoms concerning for COVID-19 in the past 10 days.  Reviewed procedure instructions with patient.

## 2021-07-11 NOTE — Telephone Encounter (Signed)
Patient agrees to hold lasix the vee before and morning of cath , and, hold lisinopril the am of cath.

## 2021-07-15 ENCOUNTER — Other Ambulatory Visit (HOSPITAL_COMMUNITY): Payer: Self-pay

## 2021-07-15 ENCOUNTER — Ambulatory Visit (HOSPITAL_COMMUNITY)
Admission: RE | Admit: 2021-07-15 | Discharge: 2021-07-15 | Disposition: A | Discharge status: Home / Self Care | Payer: Medicare HMO | Attending: Cardiovascular Disease | Admitting: Cardiovascular Disease

## 2021-07-15 ENCOUNTER — Encounter (HOSPITAL_COMMUNITY): Payer: Self-pay | Admitting: Cardiovascular Disease

## 2021-07-15 ENCOUNTER — Other Ambulatory Visit: Payer: Self-pay

## 2021-07-15 ENCOUNTER — Ambulatory Visit (HOSPITAL_COMMUNITY)
Admission: RE | Disposition: A | Discharge status: Home / Self Care | Payer: Self-pay | Source: Home / Self Care | Attending: Cardiovascular Disease

## 2021-07-15 DIAGNOSIS — I2582 Chronic total occlusion of coronary artery: Secondary | ICD-10-CM | POA: Diagnosis not present

## 2021-07-15 DIAGNOSIS — E785 Hyperlipidemia, unspecified: Secondary | ICD-10-CM | POA: Insufficient documentation

## 2021-07-15 DIAGNOSIS — N182 Chronic kidney disease, stage 2 (mild): Secondary | ICD-10-CM | POA: Diagnosis not present

## 2021-07-15 DIAGNOSIS — I200 Unstable angina: Secondary | ICD-10-CM

## 2021-07-15 DIAGNOSIS — E1122 Type 2 diabetes mellitus with diabetic chronic kidney disease: Secondary | ICD-10-CM | POA: Insufficient documentation

## 2021-07-15 DIAGNOSIS — Z7984 Long term (current) use of oral hypoglycemic drugs: Secondary | ICD-10-CM | POA: Diagnosis not present

## 2021-07-15 DIAGNOSIS — Z79899 Other long term (current) drug therapy: Secondary | ICD-10-CM | POA: Diagnosis not present

## 2021-07-15 DIAGNOSIS — I2584 Coronary atherosclerosis due to calcified coronary lesion: Secondary | ICD-10-CM | POA: Diagnosis not present

## 2021-07-15 DIAGNOSIS — I350 Nonrheumatic aortic (valve) stenosis: Secondary | ICD-10-CM | POA: Diagnosis not present

## 2021-07-15 DIAGNOSIS — I129 Hypertensive chronic kidney disease with stage 1 through stage 4 chronic kidney disease, or unspecified chronic kidney disease: Secondary | ICD-10-CM | POA: Diagnosis not present

## 2021-07-15 DIAGNOSIS — Z955 Presence of coronary angioplasty implant and graft: Secondary | ICD-10-CM

## 2021-07-15 DIAGNOSIS — Z7982 Long term (current) use of aspirin: Secondary | ICD-10-CM | POA: Diagnosis not present

## 2021-07-15 DIAGNOSIS — I25110 Atherosclerotic heart disease of native coronary artery with unstable angina pectoris: Secondary | ICD-10-CM | POA: Diagnosis not present

## 2021-07-15 DIAGNOSIS — Z87891 Personal history of nicotine dependence: Secondary | ICD-10-CM | POA: Insufficient documentation

## 2021-07-15 DIAGNOSIS — Z794 Long term (current) use of insulin: Secondary | ICD-10-CM | POA: Insufficient documentation

## 2021-07-15 HISTORY — PX: CORONARY STENT INTERVENTION: CATH118234

## 2021-07-15 HISTORY — PX: INTRAVASCULAR LITHOTRIPSY: CATH118324

## 2021-07-15 HISTORY — PX: RIGHT/LEFT HEART CATH AND CORONARY ANGIOGRAPHY: CATH118266

## 2021-07-15 LAB — POCT I-STAT 7, (LYTES, BLD GAS, ICA,H+H)
Acid-base deficit: 4 mmol/L — ABNORMAL HIGH (ref 0.0–2.0)
Acid-base deficit: 4 mmol/L — ABNORMAL HIGH (ref 0.0–2.0)
Bicarbonate: 20.9 mmol/L (ref 20.0–28.0)
Bicarbonate: 21.1 mmol/L (ref 20.0–28.0)
Calcium, Ion: 1.12 mmol/L — ABNORMAL LOW (ref 1.15–1.40)
Calcium, Ion: 1.21 mmol/L (ref 1.15–1.40)
HCT: 31 % — ABNORMAL LOW (ref 39.0–52.0)
HCT: 34 % — ABNORMAL LOW (ref 39.0–52.0)
Hemoglobin: 10.5 g/dL — ABNORMAL LOW (ref 13.0–17.0)
Hemoglobin: 11.6 g/dL — ABNORMAL LOW (ref 13.0–17.0)
O2 Saturation: 97 %
O2 Saturation: 98 %
Potassium: 3.6 mmol/L (ref 3.5–5.1)
Potassium: 3.8 mmol/L (ref 3.5–5.1)
Sodium: 146 mmol/L — ABNORMAL HIGH (ref 135–145)
Sodium: 147 mmol/L — ABNORMAL HIGH (ref 135–145)
TCO2: 22 mmol/L (ref 22–32)
TCO2: 22 mmol/L (ref 22–32)
pCO2 arterial: 35.9 mmHg (ref 32–48)
pCO2 arterial: 36.8 mmHg (ref 32–48)
pH, Arterial: 7.367 (ref 7.35–7.45)
pH, Arterial: 7.372 (ref 7.35–7.45)
pO2, Arterial: 109 mmHg — ABNORMAL HIGH (ref 83–108)
pO2, Arterial: 89 mmHg (ref 83–108)

## 2021-07-15 LAB — POCT I-STAT EG7
Acid-base deficit: 2 mmol/L (ref 0.0–2.0)
Bicarbonate: 24.2 mmol/L (ref 20.0–28.0)
Calcium, Ion: 1.33 mmol/L (ref 1.15–1.40)
HCT: 35 % — ABNORMAL LOW (ref 39.0–52.0)
Hemoglobin: 11.9 g/dL — ABNORMAL LOW (ref 13.0–17.0)
O2 Saturation: 60 %
Potassium: 4 mmol/L (ref 3.5–5.1)
Sodium: 145 mmol/L (ref 135–145)
TCO2: 26 mmol/L (ref 22–32)
pCO2, Ven: 47.1 mmHg (ref 44–60)
pH, Ven: 7.319 (ref 7.25–7.43)
pO2, Ven: 34 mmHg (ref 32–45)

## 2021-07-15 LAB — GLUCOSE, CAPILLARY
Glucose-Capillary: 151 mg/dL — ABNORMAL HIGH (ref 70–99)
Glucose-Capillary: 86 mg/dL (ref 70–99)
Glucose-Capillary: 83 mg/dL (ref 70–99)

## 2021-07-15 LAB — POCT ACTIVATED CLOTTING TIME
Activated Clotting Time: 263 seconds
Activated Clotting Time: 287 seconds
Activated Clotting Time: 209 seconds
Activated Clotting Time: 185 seconds

## 2021-07-15 SURGERY — RIGHT/LEFT HEART CATH AND CORONARY ANGIOGRAPHY
Anesthesia: LOCAL

## 2021-07-15 MED ORDER — SODIUM CHLORIDE 0.9 % IV SOLN: INTRAVENOUS | Status: DC

## 2021-07-15 MED ORDER — ONDANSETRON HCL 4 MG/2ML IJ SOLN: 4.0000 mg | Freq: Four times a day (QID) | INTRAMUSCULAR | Status: DC | PRN

## 2021-07-15 MED ORDER — CLOPIDOGREL BISULFATE 75 MG PO TABS: 75.0000 mg | ORAL_TABLET | Freq: Every day | ORAL | Status: DC

## 2021-07-15 MED ORDER — SODIUM CHLORIDE 0.9% FLUSH: 3.0000 mL | INTRAVENOUS | Status: DC | PRN

## 2021-07-15 MED ORDER — MIDAZOLAM HCL 2 MG/2ML IJ SOLN
INTRAMUSCULAR | Status: AC
  Filled 2021-07-15: qty 2

## 2021-07-15 MED ORDER — IOHEXOL 350 MG/ML SOLN
INTRAVENOUS | Status: DC | PRN
  Administered 2021-07-15: 130 mL

## 2021-07-15 MED ORDER — FENTANYL CITRATE (PF) 100 MCG/2ML IJ SOLN
INTRAMUSCULAR | Status: AC
  Filled 2021-07-15: qty 2

## 2021-07-15 MED ORDER — LIDOCAINE HCL (PF) 1 % IJ SOLN
INTRAMUSCULAR | Status: DC | PRN
  Administered 2021-07-15: 2 mL
  Administered 2021-07-15: 12 mL
  Administered 2021-07-15: 2 mL

## 2021-07-15 MED ORDER — ROSUVASTATIN CALCIUM 20 MG PO TABS: 20.0000 mg | ORAL_TABLET | Freq: Every evening | ORAL | Status: DC

## 2021-07-15 MED ORDER — PANTOPRAZOLE SODIUM 40 MG PO TBEC: 40.0000 mg | DELAYED_RELEASE_TABLET | Freq: Every evening | ORAL | Status: DC

## 2021-07-15 MED ORDER — LIDOCAINE HCL (PF) 1 % IJ SOLN
INTRAMUSCULAR | Status: AC
  Filled 2021-07-15: qty 30

## 2021-07-15 MED ORDER — FUROSEMIDE 20 MG PO TABS: 20.0000 mg | ORAL_TABLET | Freq: Two times a day (BID) | ORAL | Status: DC

## 2021-07-15 MED ORDER — LABETALOL HCL 5 MG/ML IV SOLN: 10.0000 mg | INTRAVENOUS | Status: DC | PRN

## 2021-07-15 MED ORDER — SODIUM CHLORIDE 0.9 % WEIGHT BASED INFUSION
3.0000 mL/kg/h | INTRAVENOUS | Status: AC
  Administered 2021-07-15: 3 mL/kg/h via INTRAVENOUS

## 2021-07-15 MED ORDER — FENTANYL CITRATE (PF) 100 MCG/2ML IJ SOLN
INTRAMUSCULAR | Status: DC | PRN
Start: 2021-07-15 — End: 2021-07-15
  Administered 2021-07-15 (×2): 25 ug via INTRAVENOUS

## 2021-07-15 MED ORDER — ASPIRIN 81 MG PO TBEC: 81.0000 mg | DELAYED_RELEASE_TABLET | Freq: Every day | ORAL | Status: DC

## 2021-07-15 MED ORDER — VERAPAMIL HCL 2.5 MG/ML IV SOLN
INTRAVENOUS | Status: AC
  Filled 2021-07-15: qty 2

## 2021-07-15 MED ORDER — NITROGLYCERIN 1 MG/10 ML FOR IR/CATH LAB
INTRA_ARTERIAL | Status: AC
  Filled 2021-07-15: qty 10

## 2021-07-15 MED ORDER — ASPIRIN 81 MG PO CHEW: 81.0000 mg | CHEWABLE_TABLET | ORAL | Status: DC

## 2021-07-15 MED ORDER — HEPARIN (PORCINE) IN NACL 1000-0.9 UT/500ML-% IV SOLN
INTRAVENOUS | Status: AC
  Filled 2021-07-15: qty 500

## 2021-07-15 MED ORDER — INSULIN DETEMIR 100 UNIT/ML FLEXPEN
90.0000 [IU] | PEN_INJECTOR | Freq: Every day | SUBCUTANEOUS | Status: DC
Start: 2021-07-15 — End: 2021-07-15

## 2021-07-15 MED ORDER — CLOPIDOGREL BISULFATE 300 MG PO TABS
ORAL_TABLET | ORAL | Status: DC | PRN
  Administered 2021-07-15: 600 mg via ORAL

## 2021-07-15 MED ORDER — SODIUM CHLORIDE 0.9% FLUSH: 3.0000 mL | Freq: Two times a day (BID) | INTRAVENOUS | Status: DC

## 2021-07-15 MED ORDER — GLIPIZIDE 10 MG PO TABS: 10.0000 mg | ORAL_TABLET | Freq: Two times a day (BID) | ORAL | Status: DC

## 2021-07-15 MED ORDER — HEPARIN SODIUM (PORCINE) 1000 UNIT/ML IJ SOLN
INTRAMUSCULAR | Status: AC
  Filled 2021-07-15: qty 10

## 2021-07-15 MED ORDER — METOPROLOL TARTRATE 12.5 MG HALF TABLET: 25.0000 mg | ORAL_TABLET | Freq: Every day | ORAL | Status: DC

## 2021-07-15 MED ORDER — MIDAZOLAM HCL 2 MG/2ML IJ SOLN
INTRAMUSCULAR | Status: DC | PRN
  Administered 2021-07-15 (×2): 1 mg via INTRAVENOUS

## 2021-07-15 MED ORDER — DAPAGLIFLOZIN PROPANEDIOL 10 MG PO TABS
10.0000 mg | ORAL_TABLET | Freq: Every day | ORAL | Status: DC
Start: 2021-07-16 — End: 2021-07-15

## 2021-07-15 MED ORDER — CLOPIDOGREL BISULFATE 75 MG PO TABS
75.0000 mg | ORAL_TABLET | Freq: Every day | ORAL | 11 refills | Status: DC
  Filled 2021-07-15: qty 30, 30d supply, fill #0

## 2021-07-15 MED ORDER — VERAPAMIL HCL 2.5 MG/ML IV SOLN
INTRAVENOUS | Status: DC | PRN
  Administered 2021-07-15: 10 mL via INTRA_ARTERIAL

## 2021-07-15 MED ORDER — HYDRALAZINE HCL 20 MG/ML IJ SOLN: 10.0000 mg | INTRAMUSCULAR | Status: DC | PRN

## 2021-07-15 MED ORDER — FAMOTIDINE IN NACL 20-0.9 MG/50ML-% IV SOLN
INTRAVENOUS | Status: DC | PRN
  Administered 2021-07-15: 20 mg via INTRAVENOUS

## 2021-07-15 MED ORDER — SODIUM CHLORIDE 0.9 % IV SOLN: 250.0000 mL | INTRAVENOUS | Status: DC | PRN

## 2021-07-15 MED ORDER — CLOPIDOGREL BISULFATE 300 MG PO TABS
ORAL_TABLET | ORAL | Status: AC
  Filled 2021-07-15: qty 2

## 2021-07-15 MED ORDER — ACARBOSE 100 MG PO TABS: 100.0000 mg | ORAL_TABLET | Freq: Three times a day (TID) | ORAL | Status: DC

## 2021-07-15 MED ORDER — LISINOPRIL 10 MG PO TABS: 20.0000 mg | ORAL_TABLET | Freq: Every day | ORAL | Status: DC

## 2021-07-15 MED ORDER — SODIUM CHLORIDE 0.9 % WEIGHT BASED INFUSION: 1.0000 mL/kg/h | INTRAVENOUS | Status: DC

## 2021-07-15 MED ORDER — HEPARIN (PORCINE) IN NACL 1000-0.9 UT/500ML-% IV SOLN
INTRAVENOUS | Status: DC | PRN
  Administered 2021-07-15 (×2): 500 mL

## 2021-07-15 MED ORDER — HEPARIN SODIUM (PORCINE) 1000 UNIT/ML IJ SOLN
INTRAMUSCULAR | Status: DC | PRN
  Administered 2021-07-15: 3000 [IU] via INTRAVENOUS
  Administered 2021-07-15: 6000 [IU] via INTRAVENOUS
  Administered 2021-07-15: 5000 [IU] via INTRAVENOUS

## 2021-07-15 MED ORDER — NITROGLYCERIN 0.4 MG SL SUBL: 0.4000 mg | SUBLINGUAL_TABLET | SUBLINGUAL | Status: DC | PRN

## 2021-07-15 MED ORDER — AMLODIPINE BESYLATE 5 MG PO TABS: 5.0000 mg | ORAL_TABLET | Freq: Every day | ORAL | Status: DC

## 2021-07-15 MED ORDER — FAMOTIDINE IN NACL 20-0.9 MG/50ML-% IV SOLN
INTRAVENOUS | Status: AC
  Filled 2021-07-15: qty 50

## 2021-07-15 MED ORDER — ACETAMINOPHEN 325 MG PO TABS: 650.0000 mg | ORAL_TABLET | ORAL | Status: DC | PRN

## 2021-07-15 MED ORDER — TAMSULOSIN HCL 0.4 MG PO CAPS
0.4000 mg | ORAL_CAPSULE | Freq: Every day | ORAL | Status: DC
Start: 2021-07-16 — End: 2021-07-15

## 2021-07-15 SURGICAL SUPPLY — 29 items
BALL SAPPHIRE NC24 3.75X10 (BALLOONS) ×3
BALLN SAPPHIRE 2.0X12 (BALLOONS) ×3
BALLOON SAPPHIRE 2.0X12 (BALLOONS) IMPLANT
BALLOON SAPPHIRE NC24 3.75X10 (BALLOONS) IMPLANT
BAND CMPR LRG ZPHR (HEMOSTASIS) ×2
BAND ZEPHYR COMPRESS 30 LONG (HEMOSTASIS) ×1 IMPLANT
CATH 5FR JL3.5 JR4 ANG PIG MP (CATHETERS) ×1 IMPLANT
CATH BALLN WEDGE 5F 110CM (CATHETERS) IMPLANT
CATH SHOCKWAVE 3.0X12 (CATHETERS) IMPLANT
CATH SWAN GANZ 7F STRAIGHT (CATHETERS) ×1 IMPLANT
CATH VISTA GUIDE 6FR XBLAD3.5 (CATHETERS) ×1 IMPLANT
CATHETER SHOCKWAVE 3.0X12 (CATHETERS) ×3
ELECT DEFIB PAD ADLT CADENCE (PAD) ×1 IMPLANT
GLIDESHEATH SLEND SS 6F .021 (SHEATH) ×1 IMPLANT
GUIDEWIRE .025 260CM (WIRE) ×1 IMPLANT
GUIDEWIRE INQWIRE 1.5J.035X260 (WIRE) IMPLANT
INQWIRE 1.5J .035X260CM (WIRE) ×3
KIT ENCORE 26 ADVANTAGE (KITS) ×1 IMPLANT
KIT HEART LEFT (KITS) ×3 IMPLANT
PACK CARDIAC CATHETERIZATION (CUSTOM PROCEDURE TRAY) ×3 IMPLANT
SHEATH GLIDE SLENDER 4/5FR (SHEATH) ×2 IMPLANT
SHEATH PINNACLE 7F 10CM (SHEATH) ×1 IMPLANT
STENT SYNERGY XD 3.50X16 (Permanent Stent) IMPLANT
SYNERGY XD 3.50X16 (Permanent Stent) ×3 IMPLANT
TRANSDUCER W/STOPCOCK (MISCELLANEOUS) ×3 IMPLANT
TUBING CIL FLEX 10 FLL-RA (TUBING) ×3 IMPLANT
WIRE COUGAR XT STRL 190CM (WIRE) ×1 IMPLANT
WIRE EMERALD 3MM-J .025X260CM (WIRE) ×1 IMPLANT
WIRE MICROINTRODUCER 60CM (WIRE) ×1 IMPLANT

## 2021-07-15 NOTE — Progress Notes (Signed)
Took over care/ report was given

## 2021-07-15 NOTE — Interval H&P Note (Signed)
History and Physical Interval Note:  07/15/2021 7:32 AM  William [name]  has presented today for surgery, with the diagnosis of aortic stenosis.  The various methods of treatment have been discussed with the patient and family. After consideration of risks, benefits and other options for treatment, the patient has consented to  Procedure(s): RIGHT/LEFT HEART CATH AND CORONARY ANGIOGRAPHY (N/A) as a surgical intervention.  The patient's history has been reviewed, patient examined, no change in status, stable for surgery.  I have reviewed the patient's chart and labs.  Questions were answered to the patient's satisfaction.    Cath Lab Visit (complete for each Cath Lab visit)  Clinical Evaluation Leading to the Procedure:   ACS: No.  Non-ACS:    Anginal Classification: CCS II  Anti-ischemic medical therapy: Maximal Therapy (2 or more classes of medications)  Non-Invasive Test Results: No non-invasive testing performed  Prior CABG: No previous CABG        [name]

## 2021-07-15 NOTE — Discharge Summary (Addendum)
Discharge Summary for Same Day PCI   Patient ID: William [name] MRN: [id number]; DOB: 23-Aug-1945  Admit date: 07/15/2021 Discharge date: 07/15/2021  Primary Care Provider: [name], MD  Primary Cardiologist: [name], MD  Primary Electrophysiologist:  None   Discharge Diagnoses    Active Problems:   Severe aortic stenosis   Unstable angina [facility])   Diagnostic Studies/Procedures    Cardiac Catheterization 07/15/2021:    Prox RCA lesion is 100% stenosed.   Prox Cx to Mid Cx lesion is 40% stenosed.   Dist Cx lesion is 50% stenosed.   2nd Mrg lesion is 40% stenosed.   Mid LAD-1 lesion is 95% stenosed.   Mid LAD-2 lesion is 50% stenosed.   1st Diag lesion is 50% stenosed.   Previously placed Dist RCA stent of unknown type is  widely patent.   A drug-eluting stent was successfully placed using a SYNERGY XD 3.50X16.   Post intervention, there is a 0% residual stenosis.   Severe mid LAD stenosis with heavy calcification. Moderate mid LAD stenosis. Moderate diagonal stenosis Successful PTCA/DES x 1 mid LAD with intracoronary lithotripsy prior to stent placement Moderate mid Circumflex stenosis. Moderate stenosis OM3 Large dominant RCA with total occlusion (CTO) of the mid RCA. The distal RCA fills from left to right collaterals.  RA 6, RV 47/13/14, PA 35/15 mean 29, PCWP 9, AO 116/52   Recommendations: Will continue ASA and Plavix for at least six months. Will continue workup for TAVR.   Diagnostic Dominance: Right  Intervention   _____________   History of Present Illness     William [name] is a 76 y.o. male with past medical history of CAD (s/p DES to distal RCA in 08/2013), aortic stenosis, HTN and HLD who presented to the office for follow up on 6/22.   He was last examined by [name] in 12/2020 and reported remaining active at baseline and denied any recent anginal symptoms. It was recommended he have a follow-up echocardiogram in 06/2021 for follow-up of his  aortic stenosis. This was performed on 06/19/2021 and showed a preserved EF of 60 to 65% with no regional wall motion abnormalities. He did have moderate LVH, grade 2 diastolic dysfunction, normal RV function, mild to moderate mitral valve regurgitation and severe aortic stenosis with mean gradient of 41.5 mmHg.    At recent office visit he reported remaining active at home and had a large garden he tended to daily. Said that he did not notice any chest pain or dyspnea on exertion when walking on flat ground but has noticed more shortness of breath when walking up inclines around his home. Has also noticed progressive fatigue over the past several months. He denied any specific orthopnea, PND or pitting edema (well-controlled with Lasix). No dizziness or presyncope.   Cardiac catheterization was arranged for further evaluation.  Hospital Course     The patient underwent cardiac cath as noted above with severe mid LAD stenosis with heavy calcification treated with intracoronary lithotripsy with PCI/DES x1.  Moderate mid circumflex stenosis and moderate stenosis in OM 3.  Large dominant RCA with CTO of mid RCA feeling with left-to-right collaterals. Plan for DAPT with ASA/Plavix for at least 6 months. The patient was seen by cardiac rehab while in short stay. There were no observed complications post cath. Femoral/Radial cath site was re-evaluated prior to discharge and found to be stable without any complications. Instructions/precautions regarding cath site care were given prior to discharge.  [employer]  was seen by Dr. Angelena Form and determined stable for discharge home. Follow up with our office has been arranged. Medications are listed below. Pertinent changes include Plavix.  _____________  Cath/PCI Registry Performance & Quality Measures: Aspirin prescribed? - Yes ADP Receptor Inhibitor (Plavix/Clopidogrel, Brilinta/Ticagrelor or Effient/Prasugrel) prescribed (includes medically managed  patients)? - Yes High Intensity Statin (Lipitor 40-'80mg'$  or Crestor 20-'40mg'$ ) prescribed? - Yes For EF <40%, was ACEI/ARB prescribed? - Not Applicable (EF >/= 67%) For EF <40%, Aldosterone Antagonist (Spironolactone or Eplerenone) prescribed? - Not Applicable (EF >/= 67%) Cardiac Rehab Phase II ordered (Included Medically managed Patients)? - Yes  _____________   Discharge Vitals Blood pressure (!) 123/51, pulse 65, temperature 97.8 F (36.6 C), temperature source Temporal, resp. rate 15, height '5\' 9"'$  (1.753 m), weight 103.4 kg, SpO2 96 %.  Filed Weights   07/15/21 0657  Weight: 103.4 kg    Last Labs & Radiologic Studies    CBC No results for input(s): "WBC", "NEUTROABS", "HGB", "HCT", "MCV", "PLT" in the last 72 hours. Basic Metabolic Panel No results for input(s): "NA", "K", "CL", "CO2", "GLUCOSE", "BUN", "CREATININE", "CALCIUM", "MG", "PHOS" in the last 72 hours. Liver Function Tests No results for input(s): "AST", "ALT", "ALKPHOS", "BILITOT", "PROT", "ALBUMIN" in the last 72 hours. No results for input(s): "LIPASE", "AMYLASE" in the last 72 hours. High Sensitivity Troponin:   No results for input(s): "TROPONINIHS" in the last 720 hours.  BNP Invalid input(s): "POCBNP" D-Dimer No results for input(s): "DDIMER" in the last 72 hours. Hemoglobin A1C No results for input(s): "HGBA1C" in the last 72 hours. Fasting Lipid Panel No results for input(s): "CHOL", "HDL", "LDLCALC", "TRIG", "CHOLHDL", "LDLDIRECT" in the last 72 hours. Thyroid Function Tests No results for input(s): "TSH", "T4TOTAL", "T3FREE", "THYROIDAB" in the last 72 hours.  Invalid input(s): "FREET3" _____________  CARDIAC CATHETERIZATION  Result Date: 07/15/2021   Prox RCA lesion is 100% stenosed.   Prox Cx to Mid Cx lesion is 40% stenosed.   Dist Cx lesion is 50% stenosed.   2nd Mrg lesion is 40% stenosed.   Mid LAD-1 lesion is 95% stenosed.   Mid LAD-2 lesion is 50% stenosed.   1st Diag lesion is 50% stenosed.    Previously placed Dist RCA stent of unknown type is  widely patent.   A drug-eluting stent was successfully placed using a SYNERGY XD 3.50X16.   Post intervention, there is a 0% residual stenosis. Severe mid LAD stenosis with heavy calcification. Moderate mid LAD stenosis. Moderate diagonal stenosis Successful PTCA/DES x 1 mid LAD with intracoronary lithotripsy prior to stent placement Moderate mid Circumflex stenosis. Moderate stenosis OM3 Large dominant RCA with total occlusion (CTO) of the mid RCA. The distal RCA fills from left to right collaterals. RA 6, RV 47/13/14, PA 35/15 mean 29, PCWP 9, AO 116/52 Recommendations: Will continue ASA and Plavix for at least six months. Will continue workup for TAVR.   PERIPHERAL VASCULAR CATHETERIZATION  Result Date: 07/15/2021   Prox RCA lesion is 100% stenosed.   Prox Cx to Mid Cx lesion is 40% stenosed.   Dist Cx lesion is 50% stenosed.   2nd Mrg lesion is 40% stenosed.   Mid LAD-1 lesion is 95% stenosed.   Mid LAD-2 lesion is 50% stenosed.   1st Diag lesion is 50% stenosed.   Previously placed Dist RCA stent of unknown type is  widely patent.   A drug-eluting stent was successfully placed using a SYNERGY XD 3.50X16.   Post intervention, there is a 0% residual stenosis.  Severe mid LAD stenosis with heavy calcification. Moderate mid LAD stenosis. Moderate diagonal stenosis Successful PTCA/DES x 1 mid LAD with intracoronary lithotripsy prior to stent placement Moderate mid Circumflex stenosis. Moderate stenosis OM3 Large dominant RCA with total occlusion (CTO) of the mid RCA. The distal RCA fills from left to right collaterals. RA 6, RV 47/13/14, PA 35/15 mean 29, PCWP 9, AO 116/52 Recommendations: Will continue ASA and Plavix for at least six months. Will continue workup for TAVR.   ECHOCARDIOGRAM COMPLETE  Result Date: 06/19/2021    ECHOCARDIOGRAM REPORT   Patient Name:   JAVID KEMLER Date of Exam: 06/19/2021 Medical Rec #:  [id number]     Height:       69.0 in  Accession #:    [id number]    Weight:       231.4 lb Date of Birth:  1945-12-12     BSA:          2.198 m Patient Age:    41 years      BP:           121/74 mmHg Patient Gender: M             HR:           74 bpm. Exam Location:  [facility] Procedure: 2D Echo, Cardiac Doppler and Color Doppler Indications:    I35.0 (ICD-10-CM) - Nonrheumatic aortic valve stenosis  History:        Patient has prior history of Echocardiogram examinations, most                 recent 01/03/2021. CAD, Aortic Valve Disease; Risk                 Factors:Former Smoker, Dyslipidemia, Diabetes and Hypertension.  Sonographer:    Alvino Chapel RCS Referring Phys: [place]  1. Left ventricular ejection fraction, by estimation, is 60 to 65%. The left ventricle has normal function. The left ventricle has no regional wall motion abnormalities. There is moderate concentric left ventricular hypertrophy. Left ventricular diastolic parameters are consistent with Grade II diastolic dysfunction (pseudonormalization). Elevated left ventricular end-diastolic pressure.  2. Right ventricular systolic function is normal. The right ventricular size is normal. Tricuspid regurgitation signal is inadequate for assessing PA pressure.  3. Left atrial size was mildly dilated.  4. The mitral valve is degenerative. Mild to moderate mitral valve regurgitation. No evidence of mitral stenosis. Moderate mitral annular calcification.  5. The aortic valve is calcified. There is severe calcifcation of the aortic valve. There is severe thickening of the aortic valve. Aortic valve regurgitation is not visualized. Severe aortic valve stenosis. Aortic valve area, by VTI measures 0.61 cm. Aortic valve mean gradient measures 41.5 mmHg. Aortic valve Vmax measures 4.08 m/s.  6. The inferior vena cava is dilated in size with >50% respiratory variability, suggesting right atrial pressure of 8 mmHg.  7. Compared to study dated 06/24/2018, the P/M transaortic  gradients have increased from 40/22 to 66/84mHg, DVI has decreased from 0.41 to 0.27, VMax has increased from 3.117m to 4.0822mand AVA has decreased from 1.18cm2 (VTI) to 0.61cm2. Aortic stenosis is now severe. FINDINGS  Left Ventricle: Left ventricular ejection fraction, by estimation, is 60 to 65%. The left ventricle has normal function. The left ventricle has no regional wall motion abnormalities. The left ventricular internal cavity size was normal in size. There is  moderate concentric left ventricular hypertrophy. Left ventricular diastolic parameters are consistent with Grade  II diastolic dysfunction (pseudonormalization). Elevated left ventricular end-diastolic pressure. Right Ventricle: The right ventricular size is normal. No increase in right ventricular wall thickness. Right ventricular systolic function is normal. Tricuspid regurgitation signal is inadequate for assessing PA pressure. Left Atrium: Left atrial size was mildly dilated. Right Atrium: Right atrial size was normal in size. Pericardium: There is no evidence of pericardial effusion. Mitral Valve: The mitral valve is degenerative in appearance. Moderate mitral annular calcification. Mild to moderate mitral valve regurgitation, with anteriorly-directed jet. No evidence of mitral valve stenosis. Tricuspid Valve: The tricuspid valve is normal in structure. Tricuspid valve regurgitation is mild . No evidence of tricuspid stenosis. Aortic Valve: The aortic valve is calcified. There is severe calcifcation of the aortic valve. There is severe thickening of the aortic valve. Aortic valve regurgitation is not visualized. Severe aortic stenosis is present. Aortic valve mean gradient measures 41.5 mmHg. Aortic valve peak gradient measures 66.4 mmHg. Aortic valve area, by VTI measures 0.61 cm. Pulmonic Valve: The pulmonic valve was normal in structure. Pulmonic valve regurgitation is not visualized. No evidence of pulmonic stenosis. Aorta: The aortic  root is normal in size and structure. Venous: The inferior vena cava is dilated in size with greater than 50% respiratory variability, suggesting right atrial pressure of 8 mmHg. IAS/Shunts: No atrial level shunt detected by color flow Doppler.  LEFT VENTRICLE PLAX 2D LVIDd:         5.10 cm   Diastology LVIDs:         3.40 cm   LV e' medial:    6.20 cm/s LV PW:         1.50 cm   LV E/e' medial:  21.6 LV IVS:        1.40 cm   LV e' lateral:   7.07 cm/s LVOT diam:     1.70 cm   LV E/e' lateral: 19.0 LV SV:         62 LV SV Index:   28 LVOT Area:     2.27 cm  RIGHT VENTRICLE RV S prime:     12.30 cm/s TAPSE (M-mode): 1.8 cm LEFT ATRIUM             Index        RIGHT ATRIUM           Index LA diam:        4.50 cm 2.05 cm/m   RA Area:     20.10 cm LA Vol (A2C):   90.4 ml 41.13 ml/m  RA Volume:   55.30 ml  25.16 ml/m LA Vol (A4C):   63.6 ml 28.93 ml/m LA Biplane Vol: 78.4 ml 35.67 ml/m  AORTIC VALVE AV Area (Vmax):    0.62 cm AV Area (Vmean):   0.54 cm AV Area (VTI):     0.61 cm AV Vmax:           407.50 cm/s AV Vmean:          300.000 cm/s AV VTI:            1.010 m AV Peak Grad:      66.4 mmHg AV Mean Grad:      41.5 mmHg LVOT Vmax:         112.00 cm/s LVOT Vmean:        71.900 cm/s LVOT VTI:          0.272 m LVOT/AV VTI ratio: 0.27  AORTA Ao Root diam: 3.40 cm MITRAL VALVE MV Area (PHT): 3.48  cm       SHUNTS MV Decel Time: 218 msec       Systemic VTI:  0.27 m MR Peak grad:    146.9 mmHg   Systemic Diam: 1.70 cm MR Mean grad:    91.0 mmHg MR Vmax:         606.00 cm/s MR Vmean:        438.0 cm/s MR PISA:         2.26 cm MR PISA Eff ROA: 12 mm MR PISA Radius:  0.60 cm MV E velocity: 134.00 cm/s MV A velocity: 123.00 cm/s MV E/A ratio:  1.09 Fransico Him MD Electronically signed by Fransico Him MD Signature Date/Time: 06/19/2021/4:41:46 PM    Final     Disposition   Pt is being discharged home today in good condition.  Follow-up Plans & Appointments     Follow-up Information     [name], MD Follow up on 07/17/2021.   Specialty: Cardiology Why: at 9:20am for your follow up appt Contact information: [place]. [address] [place] [address] [phone]                   Discharge Medications   Allergies as of 07/15/2021   No Known Allergies      Medication List     STOP taking these medications    silodosin 8 MG Caps capsule Commonly known as: RAPAFLO       TAKE these medications    acarbose 100 MG tablet Commonly known as: PRECOSE Take 100 mg by mouth 3 (three) times daily with meals.   Accu-Chek Guide test strip Generic drug: glucose blood   Accu-Chek Softclix Lancets lancets 1 each 3 (three) times daily.   alendronate 70 MG tablet Commonly known as: FOSAMAX Take 1 tablet (70 mg total) by mouth every 7 (seven) days. Take with a full glass of water on an empty stomach.   alfuzosin 10 MG 24 hr tablet Commonly known as: UROXATRAL Take 1 tablet (10 mg total) by mouth daily with breakfast.   amLODipine 5 MG tablet Commonly known as: NORVASC TAKE 1 TABLET(5 MG) BY MOUTH DAILY   aspirin EC 81 MG tablet Take 81 mg by mouth daily.   Blue-Emu Hemp 10 % cream Generic drug: trolamine salicylate Apply 1 application topically as needed for muscle pain.   clopidogrel 75 MG tablet Commonly known as: Plavix Take 1 tablet (75 mg total) by mouth daily.   CoQ10 100 MG Caps Take 100 mg by mouth daily.   Farxiga 10 MG Tabs tablet Generic drug: dapagliflozin propanediol Take 10 mg by mouth daily.   furosemide 20 MG tablet Commonly known as: LASIX Take 20 mg by mouth 2 (two) times daily.   glipiZIDE 10 MG tablet Commonly known as: GLUCOTROL Take 10 mg by mouth 2 (two) times daily.   HYDROcodone-acetaminophen 10-325 MG tablet Commonly known as: NORCO Take 1 tablet by mouth 6 (six) times daily. As needed   Levemir FlexTouch 100 UNIT/ML FlexPen Generic drug: insulin detemir Inject 90 Units into the skin at bedtime.    lisinopril 20 MG tablet Commonly known as: ZESTRIL Take 1 tablet (20 mg total) by mouth daily.   Magnesium 400 MG Tabs Take 400 mg by mouth daily.   metFORMIN 500 MG 24 hr tablet Commonly known as: GLUCOPHAGE-XR Take 500 mg by mouth every evening.   metFORMIN 1000 MG tablet Commonly known as: GLUCOPHAGE Take 1,000 mg by mouth 2 (two)  times daily.   metoprolol tartrate 25 MG tablet Commonly known as: LOPRESSOR Take 1 tablet (25 mg total) by mouth daily.   MOVE FREE PO Take 1 tablet by mouth 2 (two) times daily.   Nac 600 600 MG Caps Generic drug: Acetylcysteine Take 600 mg by mouth daily.   nitroGLYCERIN 0.4 MG SL tablet Commonly known as: NITROSTAT Place 1 tablet (0.4 mg total) under the tongue every 5 (five) minutes x 3 doses as needed for chest pain (if no relief after 2nd dose, proceed to the ED for an evalution or call 911).   pantoprazole 40 MG tablet Commonly known as: PROTONIX Take 40 mg by mouth every evening.   polyethylene glycol-electrolytes 420 g solution Commonly known as: TriLyte Take 4,000 mLs by mouth as directed.   polyethylene glycol-electrolytes 420 g solution Commonly known as: TriLyte Take 4,000 mLs by mouth as directed.   potassium citrate 5 MEQ (540 MG) SR tablet Commonly known as: UROCIT-K Take by mouth.   QUERCETIN PO Take 1,000 mg by mouth daily.   rosuvastatin 20 MG tablet Commonly known as: CRESTOR Take 20 mg by mouth every evening.   tamsulosin 0.4 MG Caps capsule Commonly known as: FLOMAX Take 0.4 mg by mouth daily.   vitamin B-12 1000 MCG tablet Commonly known as: CYANOCOBALAMIN Take 1,000 mcg by mouth daily.   vitamin C 1000 MG tablet Take 1,000 mg by mouth daily.   Vitamin D3 125 MCG (5000 UT) Tabs Take 5,000 Units by mouth daily.   Vitamin K2 100 MCG Tabs Take 100 mcg by mouth daily.   zinc gluconate 50 MG tablet Take 50 mg by mouth daily.           Allergies No Known Allergies  Outstanding  Labs/Studies   N/a   Duration of Discharge Encounter   Greater than 30 minutes including physician time.  Signed, Reino Bellis, NP 07/15/2021, 11:15 AM

## 2021-07-15 NOTE — Progress Notes (Signed)
CARDIAC REHAB PHASE I   Stent education completed with pt. Pt educated on importance of ASA and Plavix. Pt given stent card along with heart healthy and diabetic diets. Reviewed site care, restrictions, and exercise guidelines. Will refer to CRP II [place] with knowledge pt is getting a TAVR w/u.   [phone] Rufina Falco, RN BSN 07/15/2021 2:54 PM

## 2021-07-15 NOTE — Progress Notes (Signed)
Site area: rt groin venous site Site Prior to Removal:  Level 0 Pressure Applied For: 15 minutes Manual:   yes Patient Status During Pull:  stable Post Pull Site:  Level 0 Post Pull Instructions Given:  yes Post Pull Pulses Present: rt dp 1+ palpable Dressing Applied:  gauze and tegaderm Bedrest begins @ 1225 Comments:

## 2021-07-16 MED FILL — Nitroglycerin IV Soln 100 MCG/ML in D5W: INTRA_ARTERIAL | Qty: 10 | Status: AC
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
## 2021-07-17 ENCOUNTER — Ambulatory Visit: Payer: Medicare HMO | Admitting: Cardiovascular Disease

## 2021-07-17 ENCOUNTER — Other Ambulatory Visit: Payer: Self-pay

## 2021-07-17 ENCOUNTER — Encounter: Payer: Self-pay | Admitting: Cardiovascular Disease

## 2021-07-17 VITALS — BP 118/60 | HR 65 | Ht 69.0 in | Wt 231.6 lb

## 2021-07-17 DIAGNOSIS — I5032 Chronic diastolic (congestive) heart failure: Secondary | ICD-10-CM

## 2021-07-17 DIAGNOSIS — Z01812 Encounter for preprocedural laboratory examination: Secondary | ICD-10-CM | POA: Diagnosis not present

## 2021-07-17 DIAGNOSIS — I350 Nonrheumatic aortic (valve) stenosis: Secondary | ICD-10-CM

## 2021-07-17 LAB — BASIC METABOLIC PANEL
BUN/Creatinine Ratio: 11 (ref 10–24)
BUN: 13 mg/dL (ref 8–27)
CO2: 22 mmol/L (ref 20–29)
Calcium: 10 mg/dL (ref 8.6–10.2)
Chloride: 106 mmol/L (ref 96–106)
Creatinine, Ser: 1.22 mg/dL (ref 0.76–1.27)
Glucose: 102 mg/dL — ABNORMAL HIGH (ref 70–99)
Potassium: 4 mmol/L (ref 3.5–5.2)
Sodium: 143 mmol/L (ref 134–144)
eGFR: 62 mL/min/{1.73_m2} (ref >59)

## 2021-07-17 NOTE — Progress Notes (Addendum)
Pre Surgical Assessment: 5 M Walk Test  35M=16.50f  5 Meter Walk Test- trial 1: 4.47 seconds 5 Meter Walk Test- trial 2: 3.75 seconds 5 Meter Walk Test- trial 3: 3.73 seconds 5 Meter Walk Test Average: 3.98 seconds   Procedure: AVR + CAB Risk of Mortality: 3.799% Renal Failure: 4.380% Permanent Stroke: 1.473% Prolonged Ventilation: 10.604% DSW Infection: 0.341% Reoperation: 4.793% Morbidity or Mortality: 18.346% Short Length of Stay: 20.528% Long Length of Stay: 10.859%

## 2021-07-17 NOTE — Patient Instructions (Addendum)
Medication Instructions:  No changes *If you need a refill on your cardiac medications before your next appointment, please call your pharmacy*   Lab Work: Today: BMET   Testing/Procedures: CT scans - see instruction letter   Follow-Up: Per Structural Heart Team   Other Instructions You have been referred to the Dental Clinic for Evaluation

## 2021-07-18 ENCOUNTER — Telehealth (HOSPITAL_COMMUNITY): Payer: Self-pay

## 2021-07-26 ENCOUNTER — Telehealth: Payer: Self-pay | Admitting: Cardiovascular Disease

## 2021-07-26 ENCOUNTER — Ambulatory Visit (HOSPITAL_COMMUNITY)
Admission: RE | Admit: 2021-07-26 | Discharge: 2021-07-26 | Disposition: A | Discharge status: Home / Self Care | Payer: Medicare HMO | Source: Ambulatory Visit | Attending: Cardiovascular Disease | Admitting: Cardiovascular Disease

## 2021-07-26 DIAGNOSIS — I5032 Chronic diastolic (congestive) heart failure: Secondary | ICD-10-CM | POA: Diagnosis not present

## 2021-07-26 DIAGNOSIS — I771 Stricture of artery: Secondary | ICD-10-CM | POA: Diagnosis not present

## 2021-07-26 DIAGNOSIS — Z01818 Encounter for other preprocedural examination: Secondary | ICD-10-CM | POA: Diagnosis not present

## 2021-07-26 DIAGNOSIS — I350 Nonrheumatic aortic (valve) stenosis: Secondary | ICD-10-CM | POA: Diagnosis not present

## 2021-07-26 DIAGNOSIS — R911 Solitary pulmonary nodule: Secondary | ICD-10-CM | POA: Diagnosis not present

## 2021-07-26 DIAGNOSIS — R918 Other nonspecific abnormal finding of lung field: Secondary | ICD-10-CM | POA: Diagnosis not present

## 2021-07-26 DIAGNOSIS — I358 Other nonrheumatic aortic valve disorders: Secondary | ICD-10-CM | POA: Diagnosis not present

## 2021-07-26 MED ORDER — IOHEXOL 350 MG/ML SOLN
100.0000 mL | Freq: Once | INTRAVENOUS | Status: AC | PRN
Start: 2021-07-26 — End: 2021-07-26
  Administered 2021-07-26: 100 mL via INTRAVENOUS

## 2021-07-26 NOTE — Telephone Encounter (Signed)
Returned call to [place] at [facility] Radiology re: findings on overread:  1. Small stone of the left ureterovesicular junction measuring 4 mm, likely nonobstructive given lack of hydronephrosis. Recommend urologic consultation. 2. Right thyroid nodule measuring 1.1 cm. Recommend thyroid ultrasound for further evaluation.  Will route to Structural Heart Team.

## 2021-07-26 NOTE — Telephone Encounter (Signed)
[place] with [facility] Radiology called to provide call report for patient. Please return call to [phone] to discuss.

## 2021-07-29 ENCOUNTER — Encounter: Payer: Self-pay | Admitting: Physician Assistant

## 2021-07-29 NOTE — Telephone Encounter (Signed)
I contacted the pt in regards to incidental findings noted on pre TAVR CT scan. The pt has a history of kidney stones and currently denies any symptoms of the kidney stone causing any problems.  The pt follows with Dr Alyson Ingles and I will forward a copy of CT results to his office.    I also spoke with the pt about thyroid nodule and he states this has been checked in the past but he cannot remember when . I made the pt aware that we will address need for thyroid US after pt's cardiac surgery. Pt agreed with plan.

## 2021-08-06 ENCOUNTER — Other Ambulatory Visit (HOSPITAL_COMMUNITY): Payer: Medicare HMO | Admitting: Dentistry

## 2021-08-06 ENCOUNTER — Encounter: Payer: Self-pay | Admitting: Gastroenterology
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
## 2021-08-07 ENCOUNTER — Other Ambulatory Visit: Payer: Self-pay

## 2021-08-07 MED ORDER — CLOPIDOGREL BISULFATE 75 MG PO TABS: 75.0000 mg | ORAL_TABLET | Freq: Every day | ORAL | 3 refills | Status: DC

## 2021-08-08 ENCOUNTER — Ambulatory Visit: Payer: Medicare HMO | Admitting: Gastroenterology

## 2021-08-08 ENCOUNTER — Encounter: Payer: Self-pay | Admitting: Gastroenterology

## 2021-08-08 VITALS — BP 111/59 | HR 66 | Temp 97.7°F | Ht 69.0 in | Wt 230.2 lb

## 2021-08-08 DIAGNOSIS — D509 Iron deficiency anemia, unspecified: Secondary | ICD-10-CM | POA: Diagnosis not present

## 2021-08-08 DIAGNOSIS — K219 Gastro-esophageal reflux disease without esophagitis: Secondary | ICD-10-CM

## 2021-08-08 DIAGNOSIS — E212 Other hyperparathyroidism: Secondary | ICD-10-CM | POA: Diagnosis not present

## 2021-08-08 NOTE — Patient Instructions (Signed)
Please have blood work completed at [facility] next week.  Continue taking pantoprazole 40 mg daily.  Recommend avoiding NSAID products including ibuprofen, Aleve, Advil, BC powders, Goody powders, and anything that says "NSAID" on the package.  We will follow-up with you in about 6 months.  Do not hesitate to call if you have any questions or concerns prior to your next visit.  Aliene Altes, PA-C [facility]

## 2021-08-09 LAB — CBC WITH DIFFERENTIAL/PLATELET
Basophils Absolute: 0.1 10*3/uL (ref 0.0–0.2)
Basos: 1 %
EOS (ABSOLUTE): 0.5 10*3/uL — ABNORMAL HIGH (ref 0.0–0.4)
Eos: 6 %
Hematocrit: 40.4 % (ref 37.5–51.0)
Hemoglobin: 13.3 g/dL (ref 13.0–17.7)
Immature Grans (Abs): 0.1 10*3/uL (ref 0.0–0.1)
Immature Granulocytes: 1 %
Lymphocytes Absolute: 1.6 10*3/uL (ref 0.7–3.1)
Lymphs: 19 %
MCH: 29.2 pg (ref 26.6–33.0)
MCHC: 32.9 g/dL (ref 31.5–35.7)
MCV: 89 fL (ref 79–97)
Monocytes Absolute: 0.8 10*3/uL (ref 0.1–0.9)
Monocytes: 9 %
Neutrophils Absolute: 5.5 10*3/uL (ref 1.4–7.0)
Neutrophils: 64 %
Platelets: 204 10*3/uL (ref 150–450)
RBC: 4.56 x10E6/uL (ref 4.14–5.80)
RDW: 14.2 % (ref 11.6–15.4)
WBC: 8.4 10*3/uL (ref 3.4–10.8)

## 2021-08-09 LAB — IRON,TIBC AND FERRITIN PANEL
Ferritin: 17 ng/mL — ABNORMAL LOW (ref 30–400)
Iron Saturation: 10 % — ABNORMAL LOW (ref 15–55)
Iron: 47 ug/dL (ref 38–169)
Total Iron Binding Capacity: 449 ug/dL (ref 250–450)
UIBC: 402 ug/dL — ABNORMAL HIGH (ref 111–343)

## 2021-08-13 ENCOUNTER — Other Ambulatory Visit: Payer: Self-pay | Admitting: Gastroenterology

## 2021-08-13 ENCOUNTER — Ambulatory Visit (INDEPENDENT_AMBULATORY_CARE_PROVIDER_SITE_OTHER): Payer: Medicare HMO | Admitting: Dentistry

## 2021-08-13 ENCOUNTER — Encounter (HOSPITAL_COMMUNITY): Payer: Self-pay | Admitting: Dentistry

## 2021-08-13 ENCOUNTER — Other Ambulatory Visit: Payer: Self-pay | Admitting: *Deleted

## 2021-08-13 VITALS — BP 113/65 | HR 68 | Temp 98.7°F

## 2021-08-13 DIAGNOSIS — Z7902 Long term (current) use of antithrombotics/antiplatelets: Secondary | ICD-10-CM

## 2021-08-13 DIAGNOSIS — K0820 Unspecified atrophy of edentulous alveolar ridge: Secondary | ICD-10-CM

## 2021-08-13 DIAGNOSIS — K030 Excessive attrition of teeth: Secondary | ICD-10-CM

## 2021-08-13 DIAGNOSIS — I350 Nonrheumatic aortic (valve) stenosis: Secondary | ICD-10-CM

## 2021-08-13 DIAGNOSIS — K08109 Complete loss of teeth, unspecified cause, unspecified class: Secondary | ICD-10-CM

## 2021-08-13 DIAGNOSIS — F40232 Fear of other medical care: Secondary | ICD-10-CM | POA: Diagnosis not present

## 2021-08-13 DIAGNOSIS — K045 Chronic apical periodontitis: Secondary | ICD-10-CM

## 2021-08-13 DIAGNOSIS — Z01818 Encounter for other preprocedural examination: Secondary | ICD-10-CM | POA: Insufficient documentation

## 2021-08-13 DIAGNOSIS — K0889 Other specified disorders of teeth and supporting structures: Secondary | ICD-10-CM

## 2021-08-13 DIAGNOSIS — K036 Deposits [accretions] on teeth: Secondary | ICD-10-CM

## 2021-08-13 DIAGNOSIS — K0530 Chronic periodontitis, unspecified: Secondary | ICD-10-CM

## 2021-08-13 DIAGNOSIS — K029 Dental caries, unspecified: Secondary | ICD-10-CM

## 2021-08-13 DIAGNOSIS — D509 Iron deficiency anemia, unspecified: Secondary | ICD-10-CM

## 2021-08-13 DIAGNOSIS — R790 Abnormal level of blood mineral: Secondary | ICD-10-CM | POA: Insufficient documentation

## 2021-08-13 DIAGNOSIS — K083 Retained dental root: Secondary | ICD-10-CM

## 2021-08-13 MED ORDER — FERROUS SULFATE 325 (65 FE) MG PO TBEC: 325.0000 mg | DELAYED_RELEASE_TABLET | Freq: Every day | ORAL | 3 refills | Status: DC

## 2021-08-13 NOTE — Patient Instructions (Signed)
[facility] DEPARTMENT OF DENTAL MEDICINE Dr. Toshiko Kemler B. Jonuel Butterfield, [name] Phone: [phone] Fax: [phone]       It was a pleasure seeing you today!  Please refer to the information below regarding your dental visit with us.  Please do not hesitate to give us a call if any questions or concerns come up after you leave.    Thank you for letting us provide care for you.  If there is anything we can do for you, please let us know.    HEART VALVES AND MOUTH CARE  FACTS: If you have any infection in your mouth, it can infect your heart valve. If you heart valve is infected, you will be seriously ill. Infections in the mouth can be SILENT and do not always cause pain. Examples of infections in the mouth are gum disease, dental cavities and abscesses. Some possible signs of infection are:  Bad breath, bleeding gums, or teeth that are sensitive to sweets, hot, and/or cold. There are many other signs as well.     WHAT YOU HAVE TO DO: Brush your teeth after meals and at bedtime.  Spend at least 2 minutes brushing well, especially behind your back teeth and all around your teeth that stand alone.  Brush at the gumline also. Do not go to bed without brushing your teeth and flossing.  If your gums bleed when you brush or floss, do NOT stop brushing or flossing.  Bleeding can be a sign of inflammation or irritation from bacteria.  It usually means that your gums need more attention and better cleaning.  If your dentist or Dr. Kaedan Richert gave you a prescription mouthwash to use, make sure to use it as directed. If you run out of the medication, notify your pharmacy. If you were given any other medications or directions by your dentist, please follow them.  If you did not understand the directions or forget what you were told, please call.  We will be happy to refresh your memory. If you need antibiotics before dental procedures, make sure you take them one hour prior to  every dental visit as directed.  Get a dental check-up every 4-6 months in order to keep your mouth healthy, or to find and treat any new infection. You will most likely need your teeth cleaned or gums treated at the same time. If you are not able to come in for your scheduled appointment, call your dentist as soon as possible to reschedule. If you have a problem in between dental visits, call your dentist.    WE ARE A TEAM.  OUR GOAL IS: HEALTHY MOUTH, HEALTHY HEART    Questions?  Call our office during office hours at [phone].   
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
## 2021-08-14 ENCOUNTER — Other Ambulatory Visit: Payer: Self-pay | Admitting: *Deleted

## 2021-08-14 DIAGNOSIS — K030 Excessive attrition of teeth: Secondary | ICD-10-CM | POA: Insufficient documentation

## 2021-08-14 DIAGNOSIS — K045 Chronic apical periodontitis: Secondary | ICD-10-CM | POA: Insufficient documentation

## 2021-08-14 DIAGNOSIS — K0820 Unspecified atrophy of edentulous alveolar ridge: Secondary | ICD-10-CM | POA: Insufficient documentation

## 2021-08-14 DIAGNOSIS — D509 Iron deficiency anemia, unspecified: Secondary | ICD-10-CM

## 2021-08-14 DIAGNOSIS — F40232 Fear of other medical care: Secondary | ICD-10-CM | POA: Insufficient documentation

## 2021-08-14 DIAGNOSIS — Z7902 Long term (current) use of antithrombotics/antiplatelets: Secondary | ICD-10-CM | POA: Insufficient documentation

## 2021-08-14 DIAGNOSIS — K0530 Chronic periodontitis, unspecified: Secondary | ICD-10-CM | POA: Insufficient documentation

## 2021-08-14 DIAGNOSIS — K0889 Other specified disorders of teeth and supporting structures: Secondary | ICD-10-CM | POA: Insufficient documentation

## 2021-08-14 DIAGNOSIS — K029 Dental caries, unspecified: Secondary | ICD-10-CM | POA: Insufficient documentation

## 2021-08-14 DIAGNOSIS — K08109 Complete loss of teeth, unspecified cause, unspecified class: Secondary | ICD-10-CM | POA: Insufficient documentation

## 2021-08-14 DIAGNOSIS — K036 Deposits [accretions] on teeth: Secondary | ICD-10-CM | POA: Insufficient documentation

## 2021-08-14 DIAGNOSIS — K083 Retained dental root: Secondary | ICD-10-CM | POA: Insufficient documentation

## 2021-08-14 NOTE — Progress Notes (Signed)
Noted. Labs entered into Epic  °

## 2021-08-15 ENCOUNTER — Encounter: Payer: Self-pay | Admitting: "Endocrinology

## 2021-08-15 ENCOUNTER — Ambulatory Visit: Payer: Medicare HMO | Admitting: "Endocrinology

## 2021-08-15 VITALS — BP 116/60 | HR 68 | Ht 69.0 in | Wt 234.2 lb

## 2021-08-15 DIAGNOSIS — M810 Age-related osteoporosis without current pathological fracture: Secondary | ICD-10-CM | POA: Diagnosis not present

## 2021-08-15 DIAGNOSIS — E212 Other hyperparathyroidism: Secondary | ICD-10-CM | POA: Diagnosis not present

## 2021-08-15 LAB — COMPREHENSIVE METABOLIC PANEL
ALT: 13 IU/L (ref 0–44)
AST: 13 IU/L (ref 0–40)
Albumin/Globulin Ratio: 1.9 (ref 1.2–2.2)
Albumin: 4.5 g/dL (ref 3.8–4.8)
Alkaline Phosphatase: 36 IU/L — ABNORMAL LOW (ref 44–121)
BUN/Creatinine Ratio: 17 (ref 10–24)
BUN: 24 mg/dL (ref 8–27)
Bilirubin Total: 0.3 mg/dL (ref 0.0–1.2)
CO2: 23 mmol/L (ref 20–29)
Calcium: 10.2 mg/dL (ref 8.6–10.2)
Chloride: 106 mmol/L (ref 96–106)
Creatinine, Ser: 1.39 mg/dL — ABNORMAL HIGH (ref 0.76–1.27)
Globulin, Total: 2.4 g/dL (ref 1.5–4.5)
Glucose: 113 mg/dL — ABNORMAL HIGH (ref 70–99)
Potassium: 5 mmol/L (ref 3.5–5.2)
Sodium: 145 mmol/L — ABNORMAL HIGH (ref 134–144)
Total Protein: 6.9 g/dL (ref 6.0–8.5)
eGFR: 53 mL/min/{1.73_m2} — ABNORMAL LOW (ref >59)

## 2021-08-15 LAB — PTH-RELATED PEPTIDE: PTH-related peptide: 4.9 pmol/L — ABNORMAL HIGH

## 2021-08-15 LAB — PTH, INTACT AND CALCIUM: PTH: 93 pg/mL — ABNORMAL HIGH (ref 15–65)

## 2021-08-15 NOTE — Progress Notes (Signed)
Endocrinology follow-up note    08/15/2021, 4:38 PM  William [name] is a 76 y.o.-year-old male, referred by his  [name], MD . Patient is returning for follow-up after he was seen in consultation for hypercalcemia/hyperparathyroidism.   Past Medical History:  Diagnosis Date   Anxiety    Aortic stenosis    CAD (coronary artery disease)    a. s/p DES to distal RCA in 08/2013, DES to LAD 07/2021   Cancer [facility])    prostate   Diabetes mellitus without complication ([place])    Family history of colon cancer    History of kidney stones    Hypercholesteremia    Hypertension    Kidney stone    PAF (paroxysmal atrial fibrillation) ([place])    Personal history of colonic polyps     Past Surgical History:  Procedure Laterality Date   APPENDECTOMY     BIOPSY  11/03/2019   Benign gastric mucosa with reactive changes and focal inflammation   BIOPSY  03/11/2021   Procedure: BIOPSY;  Surgeon: [name], DO;  [facility];  Service: Endoscopy;;   COLONOSCOPY WITH PROPOFOL N/A 02/15/2018   12 polyps ranging in 5 to 20 mm in size were tubular adenoma and recommended repeat exam in 2023   COLONOSCOPY WITH PROPOFOL N/A 03/11/2021   Procedure: COLONOSCOPY WITH PROPOFOL;  Surgeon: [name], DO;  [facility];  Service: Endoscopy;  Laterality: N/A;  9:15am   CORONARY STENT INTERVENTION N/A 07/15/2021   Procedure: CORONARY STENT INTERVENTION;  Surgeon: [name], MD;  Location: [place] [facility] LAB;  Service: Cardiovascular;  Laterality: N/A;   CORONARY STENT PLACEMENT  08/10/2013   ESOPHAGOGASTRODUODENOSCOPY (EGD) WITH PROPOFOL N/A 11/03/2019   normal esophagus, small hiatal hernia, diffuse erythematous mucosa in the entire stomach with scattered erosions.  Status post gastric biopsies for histology.  Surgical pathology found the biopsies to be benign gastric mucosa with reactive changes and focal inflammation, negative for H. Pylori.   FLEXIBLE  SIGMOIDOSCOPY N/A 11/03/2019   attempted colonoscopy but inadequate prep   gsw to abd     INTRAVASCULAR LITHOTRIPSY  07/15/2021   Procedure: INTRAVASCULAR LITHOTRIPSY;  Surgeon: [name], MD;  Location: [place] [facility];  Service: Cardiovascular;;   LEFT HEART CATHETERIZATION WITH CORONARY ANGIOGRAM N/A 08/10/2013   Procedure: LEFT HEART CATHETERIZATION WITH CORONARY ANGIOGRAM;  Surgeon: [name], MD;  Location: [place] [facility] LAB;  Service: Cardiovascular;  Laterality: N/A;   POLYPECTOMY  02/15/2018   Procedure: POLYPECTOMY;  Surgeon: [name], MD;  [facility];  Service: Endoscopy;;  colon   POLYPECTOMY  03/11/2021   Procedure: POLYPECTOMY;  Surgeon: [name], DO;  [facility];  Service: Endoscopy;;   RIGHT/LEFT HEART CATH AND CORONARY ANGIOGRAPHY N/A 07/15/2021   Procedure: RIGHT/LEFT HEART CATH AND CORONARY ANGIOGRAPHY;  Surgeon: [name], MD;  Location: [place] [facility];  Service: Cardiovascular;  Laterality: N/A;    Social History   Tobacco Use   Smoking status: Former    Packs/day: 3.00    Years: 45.00    Total pack years: 135.00    Types: Cigarettes    Start date: 01/06/1962    Quit date: 10/24/2004    Years since quitting: 16.8   Smokeless tobacco: Former    Quit date: 08/10/2005  Vaping Use   Vaping Use: Never used  Substance Use Topics   Alcohol use: Not Currently  Comment: occasional   Drug use: No    Family History  Problem Relation Age of Onset   Diabetes Mother    Heart attack Mother 39   Pulmonary embolism Father    Colon cancer Brother 50       Passed age 34 from colon ca   Gastric cancer Neg Hx    Esophageal cancer Neg Hx     Outpatient Encounter Medications as of 08/15/2021  Medication Sig   acarbose (PRECOSE) 100 MG tablet Take 100 mg by mouth 3 (three) times daily with meals.    ACCU-CHEK GUIDE test strip    Accu-Chek Softclix Lancets lancets 1 each 3 (three) times daily.    Acetylcysteine (NAC 600) 600 MG CAPS Take 600 mg by mouth daily.   alendronate (FOSAMAX) 70 MG tablet Take 1 tablet (70 mg total) by mouth every 7 (seven) days. Take with a full glass of water on an empty stomach.   alfuzosin (UROXATRAL) 10 MG 24 hr tablet Take 1 tablet (10 mg total) by mouth daily with breakfast.   amLODipine (NORVASC) 5 MG tablet TAKE 1 TABLET(5 MG) BY MOUTH DAILY   Ascorbic Acid (VITAMIN C) 1000 MG tablet Take 1,000 mg by mouth daily.   aspirin EC 81 MG tablet Take 81 mg by mouth daily.    Cholecalciferol (VITAMIN D3) 125 MCG (5000 UT) TABS Take 5,000 Units by mouth daily.   clopidogrel (PLAVIX) 75 MG tablet Take 1 tablet (75 mg total) by mouth daily.   Coenzyme Q10 (COQ10) 100 MG CAPS Take 100 mg by mouth daily.   FARXIGA 10 MG TABS tablet Take 10 mg by mouth daily.    ferrous sulfate 325 (65 FE) MG EC tablet Take 1 tablet (325 mg total) by mouth daily with breakfast.   furosemide (LASIX) 20 MG tablet Take 20 mg by mouth 2 (two) times daily.    glipiZIDE (GLUCOTROL) 10 MG tablet Take 10 mg by mouth 2 (two) times daily.   Glucosamine-Chondroitin (MOVE FREE PO) Take 1 tablet by mouth 2 (two) times daily.   HYDROcodone-acetaminophen (NORCO) 10-325 MG tablet Take 1 tablet by mouth 6 (six) times daily. As needed   LEVEMIR FLEXTOUCH 100 UNIT/ML Pen Inject 90 Units into the skin at bedtime.    lisinopril (ZESTRIL) 20 MG tablet Take 1 tablet (20 mg total) by mouth daily.   Magnesium 400 MG TABS Take 400 mg by mouth daily.   Menatetrenone (VITAMIN K2) 100 MCG TABS Take 100 mcg by mouth daily.   metFORMIN (GLUCOPHAGE) 1000 MG tablet Take 1,000 mg by mouth 2 (two) times daily.   metFORMIN (GLUCOPHAGE-XR) 500 MG 24 hr tablet Take 500 mg by mouth every evening.   metoprolol tartrate (LOPRESSOR) 25 MG tablet Take 1 tablet (25 mg total) by mouth daily.   nitroGLYCERIN (NITROSTAT) 0.4 MG SL tablet Place 1 tablet (0.4 mg total) under the tongue every 5 (five) minutes x 3 doses as needed for  chest pain (if no relief after 2nd dose, proceed to the ED for an evalution or call 911).   pantoprazole (PROTONIX) 40 MG tablet Take 40 mg by mouth every evening.    potassium citrate (UROCIT-K) 5 MEQ (540 MG) SR tablet Take 5 mEq by mouth 3 (three) times daily with meals.   QUERCETIN PO Take 1,000 mg by mouth daily.   rosuvastatin (CRESTOR) 20 MG tablet Take 20 mg by mouth every evening.    tamsulosin (FLOMAX) 0.4 MG CAPS capsule Take 0.4 mg by mouth  daily.    trolamine salicylate (BLUE-EMU HEMP) 10 % cream Apply 1 application topically as needed for muscle pain.   vitamin B-12 (CYANOCOBALAMIN) 1000 MCG tablet Take 1,000 mcg by mouth daily.   zinc gluconate 50 MG tablet Take 50 mg by mouth daily.   No facility-administered encounter medications on file as of 08/15/2021.    No Known Allergies   HPI  SIDDHANTH DENK was diagnosed with hypercalcemia in December 2021.  History is obtained directly from the patient as well as chart review.  Since December 2021, he was found to have slightly above target calcium level on 5 different occasions.  The highest calcium Register was 10.6 associated with a PTH of 61. During workup for hypercalcemia, he was diagnosed with osteoporosis.  He was initiated on Fosamax which she continues to tolerate. His most recent labs show a 10.2 associated with high PTH of 93. His previously PTH is also coming down to 4.9 from 8.8. He gives history of prostate cancer reportedly in remission following up with urology in [facility].  Patient also has history of nephrolithiasis for the last 10 years.   Patient has no previously known history of  pituitary, adrenal dysfunctions; no family history of such dysfunctions.  His most recent labs are from December 2020. He has 80-90-pack-year history of smoking.  CT scan of his chest was consistent with benign findings except for coronary atherosclerosis-multiple coronary arteries . He is not under cardiology care-s/p stent  placement.  He does not have any prior history of fragility fractures.    He has CKD with ejection fraction of 53.  he is not on HCTZ or other thiazide therapy.  He has vitamin D deficiency on supplement with vitamin D3 5000 units daily. he is not on calcium supplements,  he eats dairy and green, leafy, vegetables on average amounts.  he does not have a family history of hypercalcemia, pituitary tumors, thyroid cancer, or osteoporosis.  His medical history also includes type 2 diabetes on metformin, Farxiga and glipizide along with Levemir 90 units at bedtime. Parathyroid sestamibi scan in November 2022 did not show definitive parathyroid adenoma.    ROS:  Constitutional: + Fluctuating body weight, no fatigue, no subjective hyperthermia, no subjective hypothermia Eyes: no blurry vision, no xerophthalmia ENT: no sore throat, no nodules palpated in throat, no dysphagia/odynophagia, no hoarseness Cardiovascular: no Chest Pain, no Shortness of Breath, no palpitations, no leg swelling Respiratory: no cough, no shortness of breath  Gastrointestinal: no Nausea/Vomiting/Diarhhea Musculoskeletal: no muscle/joint aches Skin: no rashes Neurological: no tremors, no numbness, no tingling, no dizziness Psychiatric: no depression, no anxiety  PE: BP 116/60   Pulse 68   Ht _0  (1.753 m)   Wt 234 lb 3.2 oz (106.2 kg)   BMI 34.59 kg/m , Body mass index is 34.59 kg/m. Wt Readings from Last 3 Encounters:  08/15/21 234 lb 3.2 oz (106.2 kg)  08/08/21 230 lb 3.2 oz (104.4 kg)  07/17/21 231 lb 9.6 oz (105.1 kg)    Constitutional:  + BMI of 34.9, not in acute distress, normal state of mind Eyes: PERRLA, EOMI, no exophthalmos ENT: moist mucous membranes, no gross thyromegaly, no gross cervical lymphadenopathy    CMP ( most recent) CMP     Component Value Date/Time   NA 145 (H) 08/08/2021 1109   K 5.0 08/08/2021 1109   CL 106 08/08/2021 1109   CO2 23 08/08/2021 1109   GLUCOSE 113 (H)  08/08/2021 1109   GLUCOSE  115 (H) 11/02/2019 0814   BUN 24 08/08/2021 1109   CREATININE 1.39 (H) 08/08/2021 1109   CALCIUM 10.2 08/08/2021 1109   PROT 6.9 08/08/2021 1109   ALBUMIN 4.5 08/08/2021 1109   AST 13 08/08/2021 1109   ALT 13 08/08/2021 1109   ALKPHOS 36 (L) 08/08/2021 1109   BILITOT 0.3 08/08/2021 1109   GFRNONAA 60 (L) 11/02/2019 0814   GFRAA >60 02/12/2018 1053      Lipid Panel ( most recent) Lipid Panel     Component Value Date/Time   CHOL  10/27/2007 0900    122        ATP III CLASSIFICATION:  <200     mg/dL   Desirable  200-239  mg/dL   Borderline High  >=240    mg/dL   High   TRIG 76 10/27/2007 0900   HDL 39 (L) 10/27/2007 0900   CHOLHDL 3.1 10/27/2007 0900   VLDL 15 10/27/2007 0900   LDLCALC  10/27/2007 0900    68        Total Cholesterol/HDL:CHD Risk Coronary Heart Disease Risk Table                     Men   Women  1/2 Average Risk   3.4   3.3  Since May 2022 his calcium has been 10.6 associated with PTH of 60-61.   Recent Results (from the past 2160 hour(s))  ECHOCARDIOGRAM COMPLETE     Status: None   Collection Time: 06/19/21  9:36 AM  Result Value Ref Range   AR max vel 0.62 cm2   AV Area VTI 0.61 cm2   AV Mean grad 41.5 mmHg   AV Peak grad 66.4 mmHg   Ao pk vel 4.08 m/s   AV Area mean vel 0.54 cm2   Area-P 1/2 3.48 cm2   S' Lateral 3.40 cm   MV M vel 6.06 m/s   MV Peak grad 146.9 mmHg   Radius 0.60 cm  CBC     Status: None   Collection Time: 07/10/21  1:30 PM  Result Value Ref Range   WBC 10.3 3.4 - 10.8 x10E3/uL   RBC 4.30 4.14 - 5.80 x10E6/uL   Hemoglobin 13.2 13.0 - 17.7 g/dL   Hematocrit 38.9 37.5 - 51.0 %   MCV 91 79 - 97 fL   MCH 30.7 26.6 - 33.0 pg   MCHC 33.9 31.5 - 35.7 g/dL   RDW 14.4 11.6 - 15.4 %   Platelets 231 150 - 450 Q25Z5/GL  Basic metabolic panel     Status: Abnormal   Collection Time: 07/10/21  1:30 PM  Result Value Ref Range   Glucose 157 (H) 70 - 99 mg/dL   BUN 15 8 - 27 mg/dL   Creatinine, Ser 1.40 (H)  0.76 - 1.27 mg/dL   eGFR 53 (L) >59 mL/min/1.73   BUN/Creatinine Ratio 11 10 - 24   Sodium 142 134 - 144 mmol/L   Potassium 4.6 3.5 - 5.2 mmol/L   Chloride 102 96 - 106 mmol/L   CO2 20 20 - 29 mmol/L   Calcium 10.3 (H) 8.6 - 10.2 mg/dL  Glucose, capillary     Status: Abnormal   Collection Time: 07/15/21  7:04 AM  Result Value Ref Range   Glucose-Capillary 151 (H) 70 - 99 mg/dL    Comment: Glucose reference range applies only to samples taken after fasting for at least 8 hours.   Comment 1 Notify RN  Comment 2 Document in Chart   I-STAT 7, (LYTES, BLD GAS, ICA, H+H)     Status: Abnormal   Collection Time: 07/15/21  9:25 AM  Result Value Ref Range   pH, Arterial 7.372 7.35 - 7.45   pCO2 arterial 35.9 32 - 48 mmHg   pO2, Arterial 109 (H) 83 - 108 mmHg   Bicarbonate 20.9 20.0 - 28.0 mmol/L   TCO2 22 22 - 32 mmol/L   O2 Saturation 98 %   Acid-base deficit 4.0 (H) 0.0 - 2.0 mmol/L   Sodium 147 (H) 135 - 145 mmol/L   Potassium 3.6 3.5 - 5.1 mmol/L   Calcium, Ion 1.12 (L) 1.15 - 1.40 mmol/L   HCT 31.0 (L) 39.0 - 52.0 %   Hemoglobin 10.5 (L) 13.0 - 17.0 g/dL   Sample type ARTERIAL   I-STAT 7, (LYTES, BLD GAS, ICA, H+H)     Status: Abnormal   Collection Time: 07/15/21  9:28 AM  Result Value Ref Range   pH, Arterial 7.367 7.35 - 7.45   pCO2 arterial 36.8 32 - 48 mmHg   pO2, Arterial 89 83 - 108 mmHg   Bicarbonate 21.1 20.0 - 28.0 mmol/L   TCO2 22 22 - 32 mmol/L   O2 Saturation 97 %   Acid-base deficit 4.0 (H) 0.0 - 2.0 mmol/L   Sodium 146 (H) 135 - 145 mmol/L   Potassium 3.8 3.5 - 5.1 mmol/L   Calcium, Ion 1.21 1.15 - 1.40 mmol/L   HCT 34.0 (L) 39.0 - 52.0 %   Hemoglobin 11.6 (L) 13.0 - 17.0 g/dL   Sample type ARTERIAL   POCT I-Stat EG7     Status: Abnormal   Collection Time: 07/15/21  9:29 AM  Result Value Ref Range   pH, Ven 7.319 7.25 - 7.43   pCO2, Ven 47.1 44 - 60 mmHg   pO2, Ven 34 32 - 45 mmHg   Bicarbonate 24.2 20.0 - 28.0 mmol/L   TCO2 26 22 - 32 mmol/L   O2  Saturation 60 %   Acid-base deficit 2.0 0.0 - 2.0 mmol/L   Sodium 145 135 - 145 mmol/L   Potassium 4.0 3.5 - 5.1 mmol/L   Calcium, Ion 1.33 1.15 - 1.40 mmol/L   HCT 35.0 (L) 39.0 - 52.0 %   Hemoglobin 11.9 (L) 13.0 - 17.0 g/dL   Sample type VENOUS    Comment MD NOTIFIED, REPEAT TEST   POCT Activated clotting time     Status: None   Collection Time: 07/15/21  9:45 AM  Result Value Ref Range   Activated Clotting Time 263 seconds    Comment: Reference range 74-137 seconds for patients not on anticoagulant therapy.  POCT Activated clotting time     Status: None   Collection Time: 07/15/21  9:56 AM  Result Value Ref Range   Activated Clotting Time 287 seconds    Comment: Reference range 74-137 seconds for patients not on anticoagulant therapy.  Glucose, capillary     Status: None   Collection Time: 07/15/21 11:23 AM  Result Value Ref Range   Glucose-Capillary 86 70 - 99 mg/dL    Comment: Glucose reference range applies only to samples taken after fasting for at least 8 hours.  POCT Activated clotting time     Status: None   Collection Time: 07/15/21 11:24 AM  Result Value Ref Range   Activated Clotting Time 209 seconds    Comment: Reference range 74-137 seconds for patients not on anticoagulant therapy.  POCT  Activated clotting time     Status: None   Collection Time: 07/15/21 12:03 PM  Result Value Ref Range   Activated Clotting Time 185 seconds    Comment: Reference range 74-137 seconds for patients not on anticoagulant therapy.  Glucose, capillary     Status: None   Collection Time: 07/15/21 12:46 PM  Result Value Ref Range   Glucose-Capillary 83 70 - 99 mg/dL    Comment: Glucose reference range applies only to samples taken after fasting for at least 8 hours.   Comment 1 Notify RN    Comment 2 Document in Chart   Basic Metabolic Panel (BMET)     Status: Abnormal   Collection Time: 07/17/21 10:18 AM  Result Value Ref Range   Glucose 102 (H) 70 - 99 mg/dL   BUN 13 8 - 27  mg/dL   Creatinine, Ser 1.22 0.76 - 1.27 mg/dL   eGFR 62 >59 mL/min/1.73   BUN/Creatinine Ratio 11 10 - 24   Sodium 143 134 - 144 mmol/L   Potassium 4.0 3.5 - 5.2 mmol/L   Chloride 106 96 - 106 mmol/L   CO2 22 20 - 29 mmol/L   Calcium 10.0 8.6 - 10.2 mg/dL  Comprehensive metabolic panel     Status: Abnormal   Collection Time: 08/08/21 11:09 AM  Result Value Ref Range   Glucose 113 (H) 70 - 99 mg/dL   BUN 24 8 - 27 mg/dL   Creatinine, Ser 1.39 (H) 0.76 - 1.27 mg/dL   eGFR 53 (L) >59 mL/min/1.73   BUN/Creatinine Ratio 17 10 - 24   Sodium 145 (H) 134 - 144 mmol/L   Potassium 5.0 3.5 - 5.2 mmol/L   Chloride 106 96 - 106 mmol/L   CO2 23 20 - 29 mmol/L   Calcium 10.2 8.6 - 10.2 mg/dL   Total Protein 6.9 6.0 - 8.5 g/dL   Albumin 4.5 3.8 - 4.8 g/dL   Globulin, Total 2.4 1.5 - 4.5 g/dL   Albumin/Globulin Ratio 1.9 1.2 - 2.2   Bilirubin Total 0.3 0.0 - 1.2 mg/dL   Alkaline Phosphatase 36 (L) 44 - 121 IU/L   AST 13 0 - 40 IU/L   ALT 13 0 - 44 IU/L  PTH, intact and calcium     Status: Abnormal   Collection Time: 08/08/21 11:09 AM  Result Value Ref Range   PTH 93 (H) 15 - 65 pg/mL   PTH Interp Comment     Comment: Interpretation                 Intact PTH    Calcium                                 (pg/mL)      (mg/dL) Normal                          15 - 65     8.6 - 10.2 Primary Hyperparathyroidism         >65          >10.2 Secondary Hyperparathyroidism       >65          <10.2 Non-Parathyroid Hypercalcemia       <65          >10.2 Hypoparathyroidism                  <  15          < 8.6 Non-Parathyroid Hypocalcemia    15 - 65          < 8.6   PTH-related peptide     Status: Abnormal   Collection Time: 08/08/21 11:09 AM  Result Value Ref Range   PTH-related peptide 4.9 (H) pmol/L    Comment: This test was developed and its performance characteristics determined by [facility]. It has not been cleared or approved by the Food and Drug Administration. Reference Range: All Ages:  <2.0 The PTHrP assay should not be used to exclude cancer or screen tumor patients for humoral hypercalcemia of malignancy (HHM). The results should always be assessed in conjunction with the patient's medical history, clinical examination, and other findings. If test results are clinically discordant, please contact the laboratory.   CBC w/Diff/Platelet     Status: Abnormal   Collection Time: 08/08/21 11:09 AM  Result Value Ref Range   WBC 8.4 3.4 - 10.8 x10E3/uL   RBC 4.56 4.14 - 5.80 x10E6/uL   Hemoglobin 13.3 13.0 - 17.7 g/dL   Hematocrit 40.4 37.5 - 51.0 %   MCV 89 79 - 97 fL   MCH 29.2 26.6 - 33.0 pg   MCHC 32.9 31.5 - 35.7 g/dL   RDW 14.2 11.6 - 15.4 %   Platelets 204 150 - 450 x10E3/uL   Neutrophils 64 Not Estab. %   Lymphs 19 Not Estab. %   Monocytes 9 Not Estab. %   Eos 6 Not Estab. %   Basos 1 Not Estab. %   Neutrophils Absolute 5.5 1.4 - 7.0 x10E3/uL   Lymphocytes Absolute 1.6 0.7 - 3.1 x10E3/uL   Monocytes Absolute 0.8 0.1 - 0.9 x10E3/uL   EOS (ABSOLUTE) 0.5 (H) 0.0 - 0.4 x10E3/uL   Basophils Absolute 0.1 0.0 - 0.2 x10E3/uL   Immature Granulocytes 1 Not Estab. %   Immature Grans (Abs) 0.1 0.0 - 0.1 x10E3/uL  Iron, TIBC and Ferritin Panel     Status: Abnormal   Collection Time: 08/08/21 11:09 AM  Result Value Ref Range   Total Iron Binding Capacity 449 250 - 450 ug/dL   UIBC 402 (H) 111 - 343 ug/dL   Iron 47 38 - 169 ug/dL   Iron Saturation 10 (L) 15 - 55 %   Ferritin 17 (L) 30 - 400 ng/mL     Assessment: 1. Hypercalcemia -  2.  Elevated PTH RP 3. Nephrolithiasis  Plan: He returns with improved calcium at 10.2, elevated PTH 93.  He does not CKD with ejection fraction of 53.   He was found to have elevated PTH RP of 8.8, now decreasing to 4.9.    His 24-hour urine calcium was 75. He will be kept on observation. - Patient also  has vitamin D deficiency, currently on vitamin D supplement.  His last vitamin D was 31.5.    -Patient with history of  nephrolithiasis, not clear if it was related to hypercalcemia.   -He does have osteoporosis based on his recent bone density.  He is tolerating Fosamax, advised to continue Fosamax 70 mg p.o. weekly.  Side effects and precautions discussed with him.   -In light of his history of prostate cancer, chronic heavy smoking, and elevated PTH RP, he was sent for pulmonary CT which showed benign findings except for the aortic atherosclerosis.  Subsequent cardiac workup revealed multiple coronary artery stenosis, patient is status post stent placement.  -His presentation is still possible to be  due to mild primary hyperparathyroidism, considering high or unsuppressed PTH for hypercalcemia.    He also has CKD which is known to elevated PTH, as well as falsely elevated PTH RP.  - I discussed with the patient about the physiology of calcium and parathyroid hormone, and possible  effects of  increased PTH/ Calcium , including kidney stones, cardiac dysrhythmias, osteoporosis, abdominal pain, etc.   He will return in 4 months with repeat labs.  If he continues to have hypercalcemia, he will be considered for either surgical intervention or Sensipar.  In light of his severe/medical coronary disease, he was approached for possible plant-based diet.  Patient is hesitant to engage at this time .  He also has type 2 diabetes, wishes to address this with his PMD. He is advised to maintain close follow-up with his PMD , cardiologist ,  as well as nephrologist.   I spent 32 minutes in the care of the patient today including review of labs from Thyroid Function, CMP, and other relevant labs ; imaging/biopsy records (current and previous including abstractions from other facilities); face-to-face time discussing  his lab results and symptoms, medications doses, his options of short and long term treatment based on the latest standards of care / guidelines;   and documenting the encounter.  Idelle Leech Dicicco  participated  in the discussions, expressed understanding, and voiced agreement with the above plans.  All questions were answered to his satisfaction. he is encouraged to contact clinic should he have any questions or concerns prior to his return visit.    - Return in about 6 months (around 02/15/2022) for F/U with Pre-visit Labs.   Glade Lloyd, MD [facility] [address] [place], [place] [address] Phone: [phone]  Fax: [phone]    This note was partially dictated with voice recognition software. Similar sounding words can be transcribed inadequately or may not  be corrected upon review.  08/15/2021, 4:38 PM

## 2021-08-15 NOTE — H&P (View-Only) (Signed)
Endocrinology follow-up note    08/15/2021, 4:38 PM  William [name] is a 76 y.o.-year-old male, referred by his  [name], MD . Patient is returning for follow-up after he was seen in consultation for hypercalcemia/hyperparathyroidism.   Past Medical History:  Diagnosis Date   Anxiety    Aortic stenosis    CAD (coronary artery disease)    a. s/p DES to distal RCA in 08/2013, DES to LAD 07/2021   Cancer [facility])    prostate   Diabetes mellitus without complication ([place])    Family history of colon cancer    History of kidney stones    Hypercholesteremia    Hypertension    Kidney stone    PAF (paroxysmal atrial fibrillation) ([place])    Personal history of colonic polyps     Past Surgical History:  Procedure Laterality Date   APPENDECTOMY     BIOPSY  11/03/2019   Benign gastric mucosa with reactive changes and focal inflammation   BIOPSY  03/11/2021   Procedure: BIOPSY;  Surgeon: [name], DO;  [facility];  Service: Endoscopy;;   COLONOSCOPY WITH PROPOFOL N/A 02/15/2018   12 polyps ranging in 5 to 20 mm in size were tubular adenoma and recommended repeat exam in 2023   COLONOSCOPY WITH PROPOFOL N/A 03/11/2021   Procedure: COLONOSCOPY WITH PROPOFOL;  Surgeon: [name], DO;  [facility];  Service: Endoscopy;  Laterality: N/A;  9:15am   CORONARY STENT INTERVENTION N/A 07/15/2021   Procedure: CORONARY STENT INTERVENTION;  Surgeon: [name], MD;  Location: [place] CV LAB;  Service: Cardiovascular;  Laterality: N/A;   CORONARY STENT PLACEMENT  08/10/2013   ESOPHAGOGASTRODUODENOSCOPY (EGD) WITH PROPOFOL N/A 11/03/2019   normal esophagus, small hiatal hernia, diffuse erythematous mucosa in the entire stomach with scattered erosions.  Status post gastric biopsies for histology.  Surgical pathology found the biopsies to be benign gastric mucosa with reactive changes and focal inflammation, negative for H. Pylori.   FLEXIBLE  SIGMOIDOSCOPY N/A 11/03/2019   attempted colonoscopy but inadequate prep   gsw to abd     INTRAVASCULAR LITHOTRIPSY  07/15/2021   Procedure: INTRAVASCULAR LITHOTRIPSY;  Surgeon: [name], MD;  Location: [place] [facility];  Service: Cardiovascular;;   LEFT HEART CATHETERIZATION WITH CORONARY ANGIOGRAM N/A 08/10/2013   Procedure: LEFT HEART CATHETERIZATION WITH CORONARY ANGIOGRAM;  Surgeon: [name], MD;  Location: [place] C[facility] LAB;  Service: Cardiovascular;  Laterality: N/A;   POLYPECTOMY  02/15/2018   Procedure: POLYPECTOMY;  Surgeon: [name], MD;  [facility];  Service: Endoscopy;;  colon   POLYPECTOMY  03/11/2021   Procedure: POLYPECTOMY;  Surgeon: [name], DO;  [facility];  Service: Endoscopy;;   RIGHT/LEFT HEART CATH AND CORONARY ANGIOGRAPHY N/A 07/15/2021   Procedure: RIGHT/LEFT HEART CATH AND CORONARY ANGIOGRAPHY;  Surgeon: [name], MD;  Location: [place] [facility];  Service: Cardiovascular;  Laterality: N/A;    Social History   Tobacco Use   Smoking status: Former    Packs/day: 3.00    Years: 45.00    Total pack years: 135.00    Types: Cigarettes    Start date: 01/06/1962    Quit date: 10/24/2004    Years since quitting: 16.8   Smokeless tobacco: Former    Quit date: 08/10/2005  Vaping Use   Vaping Use: Never used  Substance Use Topics   Alcohol use: Not Currently  Comment: occasional   Drug use: No    Family History  Problem Relation Age of Onset   Diabetes Mother    Heart attack Mother 39   Pulmonary embolism Father    Colon cancer Brother 50       Passed age 34 from colon ca   Gastric cancer Neg Hx    Esophageal cancer Neg Hx     Outpatient Encounter Medications as of 08/15/2021  Medication Sig   acarbose (PRECOSE) 100 MG tablet Take 100 mg by mouth 3 (three) times daily with meals.    ACCU-CHEK GUIDE test strip    Accu-Chek Softclix Lancets lancets 1 each 3 (three) times daily.    Acetylcysteine (NAC 600) 600 MG CAPS Take 600 mg by mouth daily.   alendronate (FOSAMAX) 70 MG tablet Take 1 tablet (70 mg total) by mouth every 7 (seven) days. Take with a full glass of water on an empty stomach.   alfuzosin (UROXATRAL) 10 MG 24 hr tablet Take 1 tablet (10 mg total) by mouth daily with breakfast.   amLODipine (NORVASC) 5 MG tablet TAKE 1 TABLET(5 MG) BY MOUTH DAILY   Ascorbic Acid (VITAMIN C) 1000 MG tablet Take 1,000 mg by mouth daily.   aspirin EC 81 MG tablet Take 81 mg by mouth daily.    Cholecalciferol (VITAMIN D3) 125 MCG (5000 UT) TABS Take 5,000 Units by mouth daily.   clopidogrel (PLAVIX) 75 MG tablet Take 1 tablet (75 mg total) by mouth daily.   Coenzyme Q10 (COQ10) 100 MG CAPS Take 100 mg by mouth daily.   FARXIGA 10 MG TABS tablet Take 10 mg by mouth daily.    ferrous sulfate 325 (65 FE) MG EC tablet Take 1 tablet (325 mg total) by mouth daily with breakfast.   furosemide (LASIX) 20 MG tablet Take 20 mg by mouth 2 (two) times daily.    glipiZIDE (GLUCOTROL) 10 MG tablet Take 10 mg by mouth 2 (two) times daily.   Glucosamine-Chondroitin (MOVE FREE PO) Take 1 tablet by mouth 2 (two) times daily.   HYDROcodone-acetaminophen (NORCO) 10-325 MG tablet Take 1 tablet by mouth 6 (six) times daily. As needed   LEVEMIR FLEXTOUCH 100 UNIT/ML Pen Inject 90 Units into the skin at bedtime.    lisinopril (ZESTRIL) 20 MG tablet Take 1 tablet (20 mg total) by mouth daily.   Magnesium 400 MG TABS Take 400 mg by mouth daily.   Menatetrenone (VITAMIN K2) 100 MCG TABS Take 100 mcg by mouth daily.   metFORMIN (GLUCOPHAGE) 1000 MG tablet Take 1,000 mg by mouth 2 (two) times daily.   metFORMIN (GLUCOPHAGE-XR) 500 MG 24 hr tablet Take 500 mg by mouth every evening.   metoprolol tartrate (LOPRESSOR) 25 MG tablet Take 1 tablet (25 mg total) by mouth daily.   nitroGLYCERIN (NITROSTAT) 0.4 MG SL tablet Place 1 tablet (0.4 mg total) under the tongue every 5 (five) minutes x 3 doses as needed for  chest pain (if no relief after 2nd dose, proceed to the ED for an evalution or call 911).   pantoprazole (PROTONIX) 40 MG tablet Take 40 mg by mouth every evening.    potassium citrate (UROCIT-K) 5 MEQ (540 MG) SR tablet Take 5 mEq by mouth 3 (three) times daily with meals.   QUERCETIN PO Take 1,000 mg by mouth daily.   rosuvastatin (CRESTOR) 20 MG tablet Take 20 mg by mouth every evening.    tamsulosin (FLOMAX) 0.4 MG CAPS capsule Take 0.4 mg by mouth  daily.    trolamine salicylate (BLUE-EMU HEMP) 10 % cream Apply 1 application topically as needed for muscle pain.   vitamin B-12 (CYANOCOBALAMIN) 1000 MCG tablet Take 1,000 mcg by mouth daily.   zinc gluconate 50 MG tablet Take 50 mg by mouth daily.   No facility-administered encounter medications on file as of 08/15/2021.    No Known Allergies   HPI  SIDDHANTH DENK was diagnosed with hypercalcemia in December 2021.  History is obtained directly from the patient as well as chart review.  Since December 2021, he was found to have slightly above target calcium level on 5 different occasions.  The highest calcium Register was 10.6 associated with a PTH of 61. During workup for hypercalcemia, he was diagnosed with osteoporosis.  He was initiated on Fosamax which she continues to tolerate. His most recent labs show a 10.2 associated with high PTH of 93. His previously PTH is also coming down to 4.9 from 8.8. He gives history of prostate cancer reportedly in remission following up with urology in [facility].  Patient also has history of nephrolithiasis for the last 10 years.   Patient has no previously known history of  pituitary, adrenal dysfunctions; no family history of such dysfunctions.  His most recent labs are from December 2020. He has 80-90-pack-year history of smoking.  CT scan of his chest was consistent with benign findings except for coronary atherosclerosis-multiple coronary arteries . He is not under cardiology care-s/p stent  placement.  He does not have any prior history of fragility fractures.    He has CKD with ejection fraction of 53.  he is not on HCTZ or other thiazide therapy.  He has vitamin D deficiency on supplement with vitamin D3 5000 units daily. he is not on calcium supplements,  he eats dairy and green, leafy, vegetables on average amounts.  he does not have a family history of hypercalcemia, pituitary tumors, thyroid cancer, or osteoporosis.  His medical history also includes type 2 diabetes on metformin, Farxiga and glipizide along with Levemir 90 units at bedtime. Parathyroid sestamibi scan in November 2022 did not show definitive parathyroid adenoma.    ROS:  Constitutional: + Fluctuating body weight, no fatigue, no subjective hyperthermia, no subjective hypothermia Eyes: no blurry vision, no xerophthalmia ENT: no sore throat, no nodules palpated in throat, no dysphagia/odynophagia, no hoarseness Cardiovascular: no Chest Pain, no Shortness of Breath, no palpitations, no leg swelling Respiratory: no cough, no shortness of breath  Gastrointestinal: no Nausea/Vomiting/Diarhhea Musculoskeletal: no muscle/joint aches Skin: no rashes Neurological: no tremors, no numbness, no tingling, no dizziness Psychiatric: no depression, no anxiety  PE: BP 116/60   Pulse 68   Ht _0  (1.753 m)   Wt 234 lb 3.2 oz (106.2 kg)   BMI 34.59 kg/m , Body mass index is 34.59 kg/m. Wt Readings from Last 3 Encounters:  08/15/21 234 lb 3.2 oz (106.2 kg)  08/08/21 230 lb 3.2 oz (104.4 kg)  07/17/21 231 lb 9.6 oz (105.1 kg)    Constitutional:  + BMI of 34.9, not in acute distress, normal state of mind Eyes: PERRLA, EOMI, no exophthalmos ENT: moist mucous membranes, no gross thyromegaly, no gross cervical lymphadenopathy    CMP ( most recent) CMP     Component Value Date/Time   NA 145 (H) 08/08/2021 1109   K 5.0 08/08/2021 1109   CL 106 08/08/2021 1109   CO2 23 08/08/2021 1109   GLUCOSE 113 (H)  08/08/2021 1109   GLUCOSE  115 (H) 11/02/2019 0814   BUN 24 08/08/2021 1109   CREATININE 1.39 (H) 08/08/2021 1109   CALCIUM 10.2 08/08/2021 1109   PROT 6.9 08/08/2021 1109   ALBUMIN 4.5 08/08/2021 1109   AST 13 08/08/2021 1109   ALT 13 08/08/2021 1109   ALKPHOS 36 (L) 08/08/2021 1109   BILITOT 0.3 08/08/2021 1109   GFRNONAA 60 (L) 11/02/2019 0814   GFRAA >60 02/12/2018 1053      Lipid Panel ( most recent) Lipid Panel     Component Value Date/Time   CHOL  10/27/2007 0900    122        ATP III CLASSIFICATION:  <200     mg/dL   Desirable  200-239  mg/dL   Borderline High  >=240    mg/dL   High   TRIG 76 10/27/2007 0900   HDL 39 (L) 10/27/2007 0900   CHOLHDL 3.1 10/27/2007 0900   VLDL 15 10/27/2007 0900   LDLCALC  10/27/2007 0900    68        Total Cholesterol/HDL:CHD Risk Coronary Heart Disease Risk Table                     Men   Women  1/2 Average Risk   3.4   3.3  Since May 2022 his calcium has been 10.6 associated with PTH of 60-61.   Recent Results (from the past 2160 hour(s))  ECHOCARDIOGRAM COMPLETE     Status: None   Collection Time: 06/19/21  9:36 AM  Result Value Ref Range   AR max vel 0.62 cm2   AV Area VTI 0.61 cm2   AV Mean grad 41.5 mmHg   AV Peak grad 66.4 mmHg   Ao pk vel 4.08 m/s   AV Area mean vel 0.54 cm2   Area-P 1/2 3.48 cm2   S' Lateral 3.40 cm   MV M vel 6.06 m/s   MV Peak grad 146.9 mmHg   Radius 0.60 cm  CBC     Status: None   Collection Time: 07/10/21  1:30 PM  Result Value Ref Range   WBC 10.3 3.4 - 10.8 x10E3/uL   RBC 4.30 4.14 - 5.80 x10E6/uL   Hemoglobin 13.2 13.0 - 17.7 g/dL   Hematocrit 38.9 37.5 - 51.0 %   MCV 91 79 - 97 fL   MCH 30.7 26.6 - 33.0 pg   MCHC 33.9 31.5 - 35.7 g/dL   RDW 14.4 11.6 - 15.4 %   Platelets 231 150 - 450 Q25Z5/GL  Basic metabolic panel     Status: Abnormal   Collection Time: 07/10/21  1:30 PM  Result Value Ref Range   Glucose 157 (H) 70 - 99 mg/dL   BUN 15 8 - 27 mg/dL   Creatinine, Ser 1.40 (H)  0.76 - 1.27 mg/dL   eGFR 53 (L) >59 mL/min/1.73   BUN/Creatinine Ratio 11 10 - 24   Sodium 142 134 - 144 mmol/L   Potassium 4.6 3.5 - 5.2 mmol/L   Chloride 102 96 - 106 mmol/L   CO2 20 20 - 29 mmol/L   Calcium 10.3 (H) 8.6 - 10.2 mg/dL  Glucose, capillary     Status: Abnormal   Collection Time: 07/15/21  7:04 AM  Result Value Ref Range   Glucose-Capillary 151 (H) 70 - 99 mg/dL    Comment: Glucose reference range applies only to samples taken after fasting for at least 8 hours.   Comment 1 Notify RN  Comment 2 Document in Chart   I-STAT 7, (LYTES, BLD GAS, ICA, H+H)     Status: Abnormal   Collection Time: 07/15/21  9:25 AM  Result Value Ref Range   pH, Arterial 7.372 7.35 - 7.45   pCO2 arterial 35.9 32 - 48 mmHg   pO2, Arterial 109 (H) 83 - 108 mmHg   Bicarbonate 20.9 20.0 - 28.0 mmol/L   TCO2 22 22 - 32 mmol/L   O2 Saturation 98 %   Acid-base deficit 4.0 (H) 0.0 - 2.0 mmol/L   Sodium 147 (H) 135 - 145 mmol/L   Potassium 3.6 3.5 - 5.1 mmol/L   Calcium, Ion 1.12 (L) 1.15 - 1.40 mmol/L   HCT 31.0 (L) 39.0 - 52.0 %   Hemoglobin 10.5 (L) 13.0 - 17.0 g/dL   Sample type ARTERIAL   I-STAT 7, (LYTES, BLD GAS, ICA, H+H)     Status: Abnormal   Collection Time: 07/15/21  9:28 AM  Result Value Ref Range   pH, Arterial 7.367 7.35 - 7.45   pCO2 arterial 36.8 32 - 48 mmHg   pO2, Arterial 89 83 - 108 mmHg   Bicarbonate 21.1 20.0 - 28.0 mmol/L   TCO2 22 22 - 32 mmol/L   O2 Saturation 97 %   Acid-base deficit 4.0 (H) 0.0 - 2.0 mmol/L   Sodium 146 (H) 135 - 145 mmol/L   Potassium 3.8 3.5 - 5.1 mmol/L   Calcium, Ion 1.21 1.15 - 1.40 mmol/L   HCT 34.0 (L) 39.0 - 52.0 %   Hemoglobin 11.6 (L) 13.0 - 17.0 g/dL   Sample type ARTERIAL   POCT I-Stat EG7     Status: Abnormal   Collection Time: 07/15/21  9:29 AM  Result Value Ref Range   pH, Ven 7.319 7.25 - 7.43   pCO2, Ven 47.1 44 - 60 mmHg   pO2, Ven 34 32 - 45 mmHg   Bicarbonate 24.2 20.0 - 28.0 mmol/L   TCO2 26 22 - 32 mmol/L   O2  Saturation 60 %   Acid-base deficit 2.0 0.0 - 2.0 mmol/L   Sodium 145 135 - 145 mmol/L   Potassium 4.0 3.5 - 5.1 mmol/L   Calcium, Ion 1.33 1.15 - 1.40 mmol/L   HCT 35.0 (L) 39.0 - 52.0 %   Hemoglobin 11.9 (L) 13.0 - 17.0 g/dL   Sample type VENOUS    Comment MD NOTIFIED, REPEAT TEST   POCT Activated clotting time     Status: None   Collection Time: 07/15/21  9:45 AM  Result Value Ref Range   Activated Clotting Time 263 seconds    Comment: Reference range 74-137 seconds for patients not on anticoagulant therapy.  POCT Activated clotting time     Status: None   Collection Time: 07/15/21  9:56 AM  Result Value Ref Range   Activated Clotting Time 287 seconds    Comment: Reference range 74-137 seconds for patients not on anticoagulant therapy.  Glucose, capillary     Status: None   Collection Time: 07/15/21 11:23 AM  Result Value Ref Range   Glucose-Capillary 86 70 - 99 mg/dL    Comment: Glucose reference range applies only to samples taken after fasting for at least 8 hours.  POCT Activated clotting time     Status: None   Collection Time: 07/15/21 11:24 AM  Result Value Ref Range   Activated Clotting Time 209 seconds    Comment: Reference range 74-137 seconds for patients not on anticoagulant therapy.  POCT  Activated clotting time     Status: None   Collection Time: 07/15/21 12:03 PM  Result Value Ref Range   Activated Clotting Time 185 seconds    Comment: Reference range 74-137 seconds for patients not on anticoagulant therapy.  Glucose, capillary     Status: None   Collection Time: 07/15/21 12:46 PM  Result Value Ref Range   Glucose-Capillary 83 70 - 99 mg/dL    Comment: Glucose reference range applies only to samples taken after fasting for at least 8 hours.   Comment 1 Notify RN    Comment 2 Document in Chart   Basic Metabolic Panel (BMET)     Status: Abnormal   Collection Time: 07/17/21 10:18 AM  Result Value Ref Range   Glucose 102 (H) 70 - 99 mg/dL   BUN 13 8 - 27  mg/dL   Creatinine, Ser 1.22 0.76 - 1.27 mg/dL   eGFR 62 >59 mL/min/1.73   BUN/Creatinine Ratio 11 10 - 24   Sodium 143 134 - 144 mmol/L   Potassium 4.0 3.5 - 5.2 mmol/L   Chloride 106 96 - 106 mmol/L   CO2 22 20 - 29 mmol/L   Calcium 10.0 8.6 - 10.2 mg/dL  Comprehensive metabolic panel     Status: Abnormal   Collection Time: 08/08/21 11:09 AM  Result Value Ref Range   Glucose 113 (H) 70 - 99 mg/dL   BUN 24 8 - 27 mg/dL   Creatinine, Ser 1.39 (H) 0.76 - 1.27 mg/dL   eGFR 53 (L) >59 mL/min/1.73   BUN/Creatinine Ratio 17 10 - 24   Sodium 145 (H) 134 - 144 mmol/L   Potassium 5.0 3.5 - 5.2 mmol/L   Chloride 106 96 - 106 mmol/L   CO2 23 20 - 29 mmol/L   Calcium 10.2 8.6 - 10.2 mg/dL   Total Protein 6.9 6.0 - 8.5 g/dL   Albumin 4.5 3.8 - 4.8 g/dL   Globulin, Total 2.4 1.5 - 4.5 g/dL   Albumin/Globulin Ratio 1.9 1.2 - 2.2   Bilirubin Total 0.3 0.0 - 1.2 mg/dL   Alkaline Phosphatase 36 (L) 44 - 121 IU/L   AST 13 0 - 40 IU/L   ALT 13 0 - 44 IU/L  PTH, intact and calcium     Status: Abnormal   Collection Time: 08/08/21 11:09 AM  Result Value Ref Range   PTH 93 (H) 15 - 65 pg/mL   PTH Interp Comment     Comment: Interpretation                 Intact PTH    Calcium                                 (pg/mL)      (mg/dL) Normal                          15 - 65     8.6 - 10.2 Primary Hyperparathyroidism         >65          >10.2 Secondary Hyperparathyroidism       >65          <10.2 Non-Parathyroid Hypercalcemia       <65          >10.2 Hypoparathyroidism                  <  15          < 8.6 Non-Parathyroid Hypocalcemia    15 - 65          < 8.6   PTH-related peptide     Status: Abnormal   Collection Time: 08/08/21 11:09 AM  Result Value Ref Range   PTH-related peptide 4.9 (H) pmol/L    Comment: This test was developed and its performance characteristics determined by [facility]. It has not been cleared or approved by the Food and Drug Administration. Reference Range: All Ages:  <2.0 The PTHrP assay should not be used to exclude cancer or screen tumor patients for humoral hypercalcemia of malignancy (HHM). The results should always be assessed in conjunction with the patient's medical history, clinical examination, and other findings. If test results are clinically discordant, please contact the laboratory.   CBC w/Diff/Platelet     Status: Abnormal   Collection Time: 08/08/21 11:09 AM  Result Value Ref Range   WBC 8.4 3.4 - 10.8 x10E3/uL   RBC 4.56 4.14 - 5.80 x10E6/uL   Hemoglobin 13.3 13.0 - 17.7 g/dL   Hematocrit 40.4 37.5 - 51.0 %   MCV 89 79 - 97 fL   MCH 29.2 26.6 - 33.0 pg   MCHC 32.9 31.5 - 35.7 g/dL   RDW 14.2 11.6 - 15.4 %   Platelets 204 150 - 450 x10E3/uL   Neutrophils 64 Not Estab. %   Lymphs 19 Not Estab. %   Monocytes 9 Not Estab. %   Eos 6 Not Estab. %   Basos 1 Not Estab. %   Neutrophils Absolute 5.5 1.4 - 7.0 x10E3/uL   Lymphocytes Absolute 1.6 0.7 - 3.1 x10E3/uL   Monocytes Absolute 0.8 0.1 - 0.9 x10E3/uL   EOS (ABSOLUTE) 0.5 (H) 0.0 - 0.4 x10E3/uL   Basophils Absolute 0.1 0.0 - 0.2 x10E3/uL   Immature Granulocytes 1 Not Estab. %   Immature Grans (Abs) 0.1 0.0 - 0.1 x10E3/uL  Iron, TIBC and Ferritin Panel     Status: Abnormal   Collection Time: 08/08/21 11:09 AM  Result Value Ref Range   Total Iron Binding Capacity 449 250 - 450 ug/dL   UIBC 402 (H) 111 - 343 ug/dL   Iron 47 38 - 169 ug/dL   Iron Saturation 10 (L) 15 - 55 %   Ferritin 17 (L) 30 - 400 ng/mL     Assessment: 1. Hypercalcemia -  2.  Elevated PTH RP 3. Nephrolithiasis  Plan: He returns with improved calcium at 10.2, elevated PTH 93.  He does not CKD with ejection fraction of 53.   He was found to have elevated PTH RP of 8.8, now decreasing to 4.9.    His 24-hour urine calcium was 75. He will be kept on observation. - Patient also  has vitamin D deficiency, currently on vitamin D supplement.  His last vitamin D was 31.5.    -Patient with history of  nephrolithiasis, not clear if it was related to hypercalcemia.   -He does have osteoporosis based on his recent bone density.  He is tolerating Fosamax, advised to continue Fosamax 70 mg p.o. weekly.  Side effects and precautions discussed with him.   -In light of his history of prostate cancer, chronic heavy smoking, and elevated PTH RP, he was sent for pulmonary CT which showed benign findings except for the aortic atherosclerosis.  Subsequent cardiac workup revealed multiple coronary artery stenosis, patient is status post stent placement.  -His presentation is still possible to be  due to mild primary hyperparathyroidism, considering high or unsuppressed PTH for hypercalcemia.    He also has CKD which is known to elevated PTH, as well as falsely elevated PTH RP.  - I discussed with the patient about the physiology of calcium and parathyroid hormone, and possible  effects of  increased PTH/ Calcium , including kidney stones, cardiac dysrhythmias, osteoporosis, abdominal pain, etc.   He will return in 4 months with repeat labs.  If he continues to have hypercalcemia, he will be considered for either surgical intervention or Sensipar.  In light of his severe/medical coronary disease, he was approached for possible plant-based diet.  Patient is hesitant to engage at this time .  He also has type 2 diabetes, wishes to address this with his PMD. He is advised to maintain close follow-up with his PMD , cardiologist ,  as well as nephrologist.   I spent 32 minutes in the care of the patient today including review of labs from Thyroid Function, CMP, and other relevant labs ; imaging/biopsy records (current and previous including abstractions from other facilities); face-to-face time discussing  his lab results and symptoms, medications doses, his options of short and long term treatment based on the latest standards of care / guidelines;   and documenting the encounter.  Idelle Leech Dicicco  participated  in the discussions, expressed understanding, and voiced agreement with the above plans.  All questions were answered to his satisfaction. he is encouraged to contact clinic should he have any questions or concerns prior to his return visit.    - Return in about 6 months (around 02/15/2022) for F/U with Pre-visit Labs.   Glade Lloyd, MD [facility] [address] [place], [place] [address] Phone: [phone]  Fax: [phone]    This note was partially dictated with voice recognition software. Similar sounding words can be transcribed inadequately or may not  be corrected upon review.  08/15/2021, 4:38 PM

## 2021-08-16 DIAGNOSIS — Z6834 Body mass index (BMI) 34.0-34.9, adult: Secondary | ICD-10-CM | POA: Diagnosis not present

## 2021-08-16 DIAGNOSIS — N183 Chronic kidney disease, stage 3 unspecified: Secondary | ICD-10-CM | POA: Diagnosis not present

## 2021-08-16 DIAGNOSIS — M150 Primary generalized (osteo)arthritis: Secondary | ICD-10-CM | POA: Diagnosis not present

## 2021-08-16 DIAGNOSIS — E6609 Other obesity due to excess calories: Secondary | ICD-10-CM | POA: Diagnosis not present

## 2021-08-16 DIAGNOSIS — E1165 Type 2 diabetes mellitus with hyperglycemia: Secondary | ICD-10-CM | POA: Diagnosis not present

## 2021-08-19 ENCOUNTER — Telehealth (HOSPITAL_COMMUNITY): Payer: Self-pay

## 2021-08-19 ENCOUNTER — Other Ambulatory Visit (HOSPITAL_COMMUNITY): Payer: Self-pay

## 2021-08-19 NOTE — Telephone Encounter (Signed)
Pharmacy Transitions of Care Follow-up Telephone Call  Date of discharge: 07/15/2021  Discharge Diagnosis: Stent placement   How have you been since you were released from the hospital? Patient has been doing well since discharge. He has no questions or concerns regarding his medications.    Medication changes made at discharge:  STOP taking: silodosin 8 MG Caps capsule (RAPAFLO)   Medication changes verified by the patient? Yes   Medication Accessibility:  Home Pharmacy: [facility]    Was the patient provided with refills on discharged medications? Yes   Have all prescriptions been transferred from [facility] to home pharmacy? Yes - has 3 refills remaining   Is the patient able to afford medications? Has insurance, [employer]  Notable copays: $1.05 30ds    Medication Review:   CLOPIDOGREL (PLAVIX) Clopidogrel 75 mg once daily.  - Advised patient of medications to avoid (NSAIDs, ASA)  - Educated that Tylenol (acetaminophen) will be the preferred analgesic to prevent risk of bleeding  - Emphasized importance of monitoring for signs and symptoms of bleeding (abnormal bruising, prolonged bleeding, nose bleeds, bleeding from gums, discolored urine, black tarry stools)  - Advised patient to alert all providers of anticoagulation therapy prior to starting a new medication or having a procedure   Follow-up Appointments:  [facility] f/u appt confirmed? Saw [name], MD on 07/17/2021.   If their condition worsens, is the pt aware to call PCP or go to the Emergency Dept.? Yes  Final Patient Assessment: Patient has had follow-up and has refills sent to home pharmacy.

## 2021-08-21 ENCOUNTER — Encounter (HOSPITAL_COMMUNITY): Payer: Self-pay | Admitting: Dentistry

## 2021-08-21 ENCOUNTER — Other Ambulatory Visit: Payer: Self-pay

## 2021-08-21 NOTE — Anesthesia Preprocedure Evaluation (Signed)
Anesthesia Evaluation  Patient identified by MRN, date of birth, ID band Patient awake    Reviewed: Allergy & Precautions, NPO status , Patient's Chart, lab work & pertinent test results  Airway Mallampati: II  TM Distance: >3 FB Neck ROM: Full    Dental  (+) Edentulous Upper, Missing, Poor Dentition   Pulmonary neg pulmonary ROS, former smoker,    Pulmonary exam normal breath sounds clear to auscultation       Cardiovascular hypertension, Pt. on medications + angina + CAD  + Valvular Problems/Murmurs AS  Rhythm:Regular Rate:Normal + Systolic murmurs RHC/LHC 5/57/32: . Prox RCA lesion is 100% stenosed. . Prox Cx to Mid Cx lesion is 40% stenosed. . Dist Cx lesion is 50% stenosed. . 2nd Mrg lesion is 40% stenosed. . Mid LAD-1 lesion is 95% stenosed. . Mid LAD-2 lesion is 50% stenosed. . 1st Diag lesion is 50% stenosed. . Previously placed Dist RCA stent of unknown type is widely patent. . A drug-eluting stent was successfully placed using a SYNERGY XD [id number]. Marland Kitchen Post intervention, there is a 0% residual stenosis.  1. Severe mid LAD stenosis with heavy calcification. Moderate mid LAD stenosis. Moderate diagonal stenosis 2. Successful PTCA/DES x 1 mid LAD with intracoronary lithotripsy prior to stent placement 3. Moderate mid Circumflex stenosis. Moderate stenosis OM3 4. Large dominant RCA with total occlusion (CTO) of the mid RCA. The distal RCA fills from left to right collaterals.  5. RA 6, RV 47/13/14, PA 35/15 mean 29, PCWP 9, AO 116/52  Recommendations: Will continue ASA and Plavix for at least six months. Will continue workup for TAVR.    Echo 06/19/21: IMPRESSIONS  1. Left ventricular ejection fraction, by estimation, is 60 to 65%. The left ventricle has normal function. The left ventricle has no regional wall motion abnormalities. There is moderate concentric left ventricular hypertrophy. Left ventricular  diastolic parameters are consistent with Grade II diastolic dysfunction (pseudonormalization). Elevated left ventricular end-diastolic pressure.  2. Right ventricular systolic function is normal. The right ventricular size is normal. Tricuspid regurgitation signal is inadequate for assessing PA pressure.  3. Left atrial size was mildly dilated.  4. The mitral valve is degenerative. Mild to moderate mitral valve regurgitation. No evidence of mitral stenosis. Moderate mitral annular calcification.  5. The aortic valve is calcified. There is severe calcifcation of the aortic valve. There is severe thickening of the aortic valve. Aortic valve regurgitation is not visualized. Severe aortic valve stenosis. Aortic valve area, by VTI measures 0.61 cm. Aortic valve mean gradient measures 41.5 mmHg. Aortic valve Vmax measures  4.08 m/s.  6. The inferior vena cava is dilated in size with >50% respiratory variability, suggesting right atrial pressure of 8 mmHg.  7. Compared to study dated 06/24/2018, the P/M transaortic gradients have increased from 40/22 to 66/49mHg, DVI has decreased from 0.41 to 0.27,  VMax has increased from 3.181m to 4.0852mand AVA has decreased from 1.18cm2 (VTI) to 0.61cm2. Aortic  stenosis is now severe.     Neuro/Psych negative neurological ROS     GI/Hepatic Neg liver ROS, GERD  Medicated,  Endo/Other  negative endocrine ROSdiabetes  Renal/GU Renal disease     Musculoskeletal negative musculoskeletal ROS (+)   Abdominal (+) + obese,   Peds  Hematology negative hematology ROS (+)   Anesthesia Other Findings   Reproductive/Obstetrics  Anesthesia Evaluation  Patient identified by MRN, date of birth, ID band Patient awake    Reviewed: Allergy & Precautions, H&P , NPO status , Patient's Chart, lab work & pertinent test results, reviewed documented beta blocker date and time    Airway Mallampati: II  TM Distance: >3 FB Neck ROM: full    Dental no notable dental hx.    Pulmonary neg pulmonary ROS, former smoker,    Pulmonary exam normal breath sounds clear to auscultation       Cardiovascular Exercise Tolerance: Good hypertension, + CAD  + Valvular Problems/Murmurs AS  Rhythm:regular Rate:Normal     Neuro/Psych PSYCHIATRIC DISORDERS Anxiety negative neurological ROS     GI/Hepatic Neg liver ROS, GERD  Medicated,  Endo/Other  negative endocrine ROSdiabetes  Renal/GU CRFRenal disease  negative genitourinary   Musculoskeletal   Abdominal   Peds  Hematology  (+) Blood dyscrasia, anemia ,   Anesthesia Other Findings 1. Left ventricular ejection fraction, by estimation, is 60 to 65%. The  left ventricle has normal function. The left ventricle has no regional  wall motion abnormalities. There is moderate left ventricular hypertrophy.  Left ventricular diastolic  parameters are consistent with Grade I diastolic dysfunction (impaired  relaxation). Elevated left atrial pressure.  2. Right ventricular systolic function is normal. The right ventricular  size is normal. Tricuspid regurgitation signal is inadequate for assessing  PA pressure.  3. The mitral valve is abnormal. Mild mitral valve regurgitation. No  evidence of mitral stenosis.  4. The aortic valve has an indeterminant number of cusps. There is  moderate calcification of the aortic valve. There is moderate thickening  of the aortic valve. Aortic valve regurgitation is not visualized.  Moderate aortic valve stenosis.  5. The inferior vena cava is normal in size with greater than 50%  respiratory variability, suggesting right atrial pressure of 3 mmHg.   Reproductive/Obstetrics negative OB ROS                             Anesthesia Physical Anesthesia Plan  ASA: 3  Anesthesia Plan: General   Post-op Pain Management:    Induction:    PONV Risk Score and Plan: Propofol infusion  Airway Management Planned:   Additional Equipment:   Intra-op Plan:   Post-operative Plan:   Informed Consent: I have reviewed the patients History and Physical, chart, labs and discussed the procedure including the risks, benefits and alternatives for the proposed anesthesia with the patient or authorized representative who has indicated his/her understanding and acceptance.     Dental Advisory Given  Plan Discussed with: CRNA  Anesthesia Plan Comments:         Anesthesia Quick Evaluation  Anesthesia Physical Anesthesia Plan  ASA: 4  Anesthesia Plan: General   Post-op Pain Management: Tylenol PO (pre-op)*   Induction: Intravenous  PONV Risk Score and Plan: 2 and Ondansetron, Dexamethasone and Treatment may vary due to age or medical condition  Airway Management Planned: Oral ETT  Additional Equipment: ClearSight  Intra-op Plan:   Post-operative Plan: Extubation in OR  Informed Consent: I have reviewed the patients History and Physical, chart, labs and discussed the procedure including the risks, benefits and alternatives for the proposed anesthesia with the patient or authorized representative who has indicated his/her understanding and acceptance.     Dental advisory given  Plan Discussed with: CRNA  Anesthesia Plan Comments: (See PAT note written 08/21/2021 by Myra Gianotti, PA-C. )  Anesthesia Quick Evaluation  

## 2021-08-21 NOTE — Progress Notes (Addendum)
Anesthesia Chart Review: William [name]  Case: [id number] Date/Time: 08/22/21 1145   Procedure: MULTIPLE EXTRACTION WITH ALVEOLOPLASTY   Anesthesia type: General   Pre-op diagnosis: dental carries   Location: [facility] OR ROOM 10 / [place] OR   Surgeons: [name], [name]       DISCUSSION: Patient is a 76 year old male scheduled for the above procedure. He is pending likely TAVR for severe AS, and dental surgery advised prior to proceeding with aortic valve surgery.   History includes former smoker (> 80 pack years; quit 10/24/04; quit smokeless tobacco 08/10/05), CAD (DES dRCA 08/10/13; DES mLAD 07/15/21), aortic stenosis (severe AS 06/19/21 echo), PAF, HTN, hypercholesterolemia, DM2, prostate cancer, BPH, hypercalcemia (possibly mild primary hyperparathyroidism followed by endocrinology & nephrology), CKD (stage II).   He just had a DES placed in the mid LAD on 07/15/21 and ASA and Plavix for at least 6 months recommended. Per Dr. Raynelle [name] 08/13/21, he does not need to hold Plavix for dental extractions.   He is a same day work-up, so anesthesia team to evaluate on the day of surgery. He had some labs on 08/08/21 including CBC and CMP.   VS:  Wt Readings from Last 3 Encounters:  08/15/21 106.2 kg  08/08/21 104.4 kg  07/17/21 105.1 kg   Temp Readings from Last 3 Encounters:  08/13/21 37.1 C (Oral)  08/08/21 36.5 C (Temporal)  07/15/21 36.6 C (Temporal)   BP Readings from Last 3 Encounters:  08/15/21 116/60  08/13/21 113/65  08/08/21 (!) 111/59   Pulse Readings from Last 3 Encounters:  08/15/21 68  08/13/21 68  08/08/21 66     PROVIDERS: [name], MD is PCP  - [name], MD is primary cardiologist - [name], MD is structural heart cardiologist - Gilford Raid, MD is CT surgeon  - Loni Beckwith, MD is endocrinologist. Last follow-up 08/15/21 for follow-up hypercalcemia. He notes that parathyroid sestamibi scan in November 2022 did not show definitive  parathyroid adenoma, but presentation still possibly due to mild primary hyperparathyroidism. Also with CKD. Plan to continue oral biphosphonates to treat osteoporosis which may indirectly help with hypercalcemia.  Follow-up in 4 months plan.  If he continues to have hypercalcemia he will be considered for either surgical intervention or Sensipar.     -[name], MD is nephrologist - Nicolette Bang, MD is urologist - Manus Rudd, MD is GI   LABS: Most recent lab results include: Lab Results  Component Value Date   WBC 8.4 08/08/2021   HGB 13.3 08/08/2021   HCT 40.4 08/08/2021   PLT 204 08/08/2021   GLUCOSE 113 (H) 08/08/2021   ALT 13 08/08/2021   AST 13 08/08/2021   NA 145 (H) 08/08/2021   K 5.0 08/08/2021   CL 106 08/08/2021   CREATININE 1.39 (H) 08/08/2021   BUN 24 08/08/2021   CO2 23 08/08/2021  Calcium 10.2 on 08/08/21.    IMAGES: CTA Chest/abd/pelvis 07/26/21: IMPRESSION: Vascular: 1. Vascular findings and measurements pertinent to potential TAVR procedure, as detailed above. 2. Thickening and calcification of the aortic valve, compatible with reported clinical history of aortic stenosis. 3. Severe aortoiliac atherosclerosis. Left main and 3 vessel coronary artery disease.   Nonvascular: 1. Small stone of the left ureterovesicular junction measuring 4 mm, likely nonobstructive given lack of hydronephrosis. Recommend urologic consultation. 2. Right thyroid nodule measuring 1.1 cm. Recommend thyroid ultrasound for further evaluation. 3. Complex ventral abdominal wall hernia containing fat, fluid and a portion of the transverse colon. -  Per 07/29/21 cardiology notes, copy of CT was forwarded to patient's urologist Dr. Alyson [name], and patient was advised that he will need thyroid US after cardiac surgery.   EKG: 07/15/21: NSR   CV: CT Coronary 07/26/21: IMPRESSION: 1. Calcified tri leaflet AV with score 2845 2. Annular area of 411.5 mm2 suitable for a 23  mm Sapien 3 valve May be more favorable to place a 29 mm Medtronic Evolut However access to patients coronaries may be an issue as he has Significant 2 vessel CAD with recent stenting of the mid LAD on 07/15/21 3.  Membranous septal length 8.7 mm 4. Optimum angiographic angle for deployment LAO 12 Caudal 16 degrees 5.  Coronary arteries sufficient height above annulus for deployment   RHC/LHC 07/15/21:   Prox RCA lesion is 100% stenosed.   Prox Cx to Mid Cx lesion is 40% stenosed.   Dist Cx lesion is 50% stenosed.   2nd Mrg lesion is 40% stenosed.   Mid LAD-1 lesion is 95% stenosed.   Mid LAD-2 lesion is 50% stenosed.   1st Diag lesion is 50% stenosed.   Previously placed Dist RCA stent of unknown type is widely patent.   A drug-eluting stent was successfully placed using a SYNERGY XD 3.50X16.   Post intervention, there is a 0% residual stenosis.   Severe mid LAD stenosis with heavy calcification. Moderate mid LAD stenosis. Moderate diagonal stenosis Successful PTCA/DES x 1 mid LAD with intracoronary lithotripsy prior to stent placement Moderate mid Circumflex stenosis. Moderate stenosis OM3 Large dominant RCA with total occlusion (CTO) of the mid RCA. The distal RCA fills from left to right collaterals.  RA 6, RV 47/13/14, PA 35/15 mean 29, PCWP 9, AO 116/52   Recommendations: Will continue ASA and Plavix for at least six months. Will continue workup for TAVR.    Echo 06/19/21: IMPRESSIONS   1. Left ventricular ejection fraction, by estimation, is 60 to 65%. The  left ventricle has normal function. The left ventricle has no regional  wall motion abnormalities. There is moderate concentric left ventricular  hypertrophy. Left ventricular  diastolic parameters are consistent with Grade II diastolic dysfunction  (pseudonormalization). Elevated left ventricular end-diastolic pressure.   2. Right ventricular systolic function is normal. The right ventricular  size is normal.  Tricuspid regurgitation signal is inadequate for assessing  PA pressure.   3. Left atrial size was mildly dilated.   4. The mitral valve is degenerative. Mild to moderate mitral valve  regurgitation. No evidence of mitral stenosis. Moderate mitral annular  calcification.   5. The aortic valve is calcified. There is severe calcifcation of the  aortic valve. There is severe thickening of the aortic valve. Aortic valve  regurgitation is not visualized. Severe aortic valve stenosis. Aortic  valve area, by VTI measures 0.61 cm.  Aortic valve mean gradient measures 41.5 mmHg. Aortic valve Vmax measures  4.08 m/s.   6. The inferior vena cava is dilated in size with >50% respiratory  variability, suggesting right atrial pressure of 8 mmHg.   7. Compared to study dated 06/24/2018, the P/M transaortic gradients have  increased from 40/22 to 66/101mHg, DVI has decreased from 0.41 to 0.27,  VMax has increased from 3.155m to 4.0844mand AVA has decreased from  1.18cm2 (VTI) to 0.61cm2. Aortic  stenosis is now severe.    US Korearotid 06/01/19 ([facility]): Impression: Color duplex indicates minimal heterogeneous plaque, with no  hemodynamically significant stenosis by duplex criteria in the  extracranial cerebrovascular circulation.  Past Medical History:  Diagnosis Date   Anxiety    Aortic stenosis    CAD (coronary artery disease)    a. s/p DES to distal RCA in 08/2013, DES to LAD 07/2021   Cancer [facility])    prostate   CKD (chronic kidney disease)    Diabetes mellitus without complication ([place])    Family history of colon cancer    Hypercalcemia    Hypercholesteremia    Hypertension    Kidney stone    PAF (paroxysmal atrial fibrillation) ([place])    Personal history of colonic polyps     Past Surgical History:  Procedure Laterality Date   APPENDECTOMY     BIOPSY  11/03/2019   Benign gastric mucosa with reactive changes and focal inflammation   BIOPSY  03/11/2021   Procedure: BIOPSY;   Surgeon: [name], DO;  [facility];  Service: Endoscopy;;   COLONOSCOPY WITH PROPOFOL N/A 02/15/2018   12 polyps ranging in 5 to 20 mm in size were tubular adenoma and recommended repeat exam in 2023   COLONOSCOPY WITH PROPOFOL N/A 03/11/2021   Procedure: COLONOSCOPY WITH PROPOFOL;  Surgeon: [name], DO;  Location: [facility];  Service: Endoscopy;  Laterality: N/A;  9:15am   CORONARY STENT INTERVENTION N/A 07/15/2021   Procedure: CORONARY STENT INTERVENTION;  Surgeon: [name], MD;  Location: [place] CV LAB;  Service: Cardiovascular;  Laterality: N/A;   CORONARY STENT PLACEMENT  08/10/2013   ESOPHAGOGASTRODUODENOSCOPY (EGD) WITH PROPOFOL N/A 11/03/2019   normal esophagus, small hiatal hernia, diffuse erythematous mucosa in the entire stomach with scattered erosions.  Status post gastric biopsies for histology.  Surgical pathology found the biopsies to be benign gastric mucosa with reactive changes and focal inflammation, negative for H. Pylori.   FLEXIBLE SIGMOIDOSCOPY N/A 11/03/2019   attempted colonoscopy but inadequate prep   gsw to abd     INTRAVASCULAR LITHOTRIPSY  07/15/2021   Procedure: INTRAVASCULAR LITHOTRIPSY;  Surgeon: [name], MD;  Location: [place] CV LAB;  Service: Cardiovascular;;   LEFT HEART CATHETERIZATION WITH CORONARY ANGIOGRAM N/A 08/10/2013   Procedure: LEFT HEART CATHETERIZATION WITH CORONARY ANGIOGRAM;  Surgeon: [name], MD;  Location: [place] CATH LAB;  Service: Cardiovascular;  Laterality: N/A;   POLYPECTOMY  02/15/2018   Procedure: POLYPECTOMY;  Surgeon: [name], MD;  [facility];  Service: Endoscopy;;  colon   POLYPECTOMY  03/11/2021   Procedure: POLYPECTOMY;  Surgeon: [name], DO;  [facility];  Service: Endoscopy;;   RIGHT/LEFT HEART CATH AND CORONARY ANGIOGRAPHY N/A 07/15/2021   Procedure: RIGHT/LEFT HEART CATH AND CORONARY ANGIOGRAPHY;  Surgeon:  [name], MD;  Location: [place] CV LAB;  Service: Cardiovascular;  Laterality: N/A;    MEDICATIONS: No current facility-administered medications for this encounter.    acarbose (PRECOSE) 100 MG tablet   Acetylcysteine (NAC 600) 600 MG CAPS   alendronate (FOSAMAX) 70 MG tablet   alfuzosin (UROXATRAL) 10 MG 24 hr tablet   amLODipine (NORVASC) 5 MG tablet   Ascorbic Acid (VITAMIN C) 1000 MG tablet   aspirin EC 81 MG tablet   Cholecalciferol (VITAMIN D3) 125 MCG (5000 UT) TABS   clopidogrel (PLAVIX) 75 MG tablet   Coenzyme Q10 (COQ10) 100 MG CAPS   FARXIGA 10 MG TABS tablet   ferrous sulfate 325 (65 FE) MG EC tablet   furosemide (LASIX) 20 MG tablet   glipiZIDE (GLUCOTROL) 10 MG tablet   Glucosamine-Chondroitin (MOVE  FREE PO)   HYDROcodone-acetaminophen (NORCO) 10-325 MG tablet   LEVEMIR FLEXTOUCH 100 UNIT/ML Pen   lisinopril (ZESTRIL) 20 MG tablet   Magnesium 400 MG TABS   Menatetrenone (VITAMIN K2) 100 MCG TABS   metFORMIN (GLUCOPHAGE) 1000 MG tablet   metFORMIN (GLUCOPHAGE-XR) 500 MG 24 hr tablet   metoprolol tartrate (LOPRESSOR) 25 MG tablet   nitroGLYCERIN (NITROSTAT) 0.4 MG SL tablet   pantoprazole (PROTONIX) 40 MG tablet   potassium citrate (UROCIT-K) 5 MEQ (540 MG) SR tablet   Quercetin 500 MG CAPS   rosuvastatin (CRESTOR) 20 MG tablet   tamsulosin (FLOMAX) 0.4 MG CAPS capsule   trolamine salicylate (BLUE-EMU HEMP) 10 % cream   vitamin B-12 (CYANOCOBALAMIN) 1000 MCG tablet   zinc gluconate 50 MG tablet   ACCU-CHEK GUIDE test strip   Accu-Chek Softclix Lancets lancets    Myra Gianotti, PA-C Surgical Short Stay/Anesthesiology [facility] Phone [phone] [facility] Phone [phone] 08/21/2021 11:02 AM

## 2021-08-21 NOTE — Progress Notes (Signed)
PCP - [name], MD Cardiologist - Josue Hector, MD   EKG - 07/16/21 ECHO - 06/19/21 Cardiac Cath - 07/15/21  Fasting Blood Sugar - 105-110 Checks Blood Sugar 3/day  Blood Thinner Instructions: Take Plavix DOS per Dr. Raynelle Dick note Aspirin Instructions: Take ASA DOS per Dr. Raynelle Dick note  ERAS Protcol - NPO  Anesthesia review: Y  Patient verbally denies any shortness of breath, fever, cough and chest pain during phone call   -------------  SDW INSTRUCTIONS given:  Your procedure is scheduled on 08/22/21.  Report to [facility] Main Entrance "A" at 0930 A.M., and check in at the Admitting office.  Call this number if you have problems the morning of surgery:  [phone]   Remember:  Do not eat or drink after midnight the night before your surgery    Take these medicines the morning of surgery with A SIP OF WATER  alfuzosin (UROXATRAL)  amLODipine (NORVASC) Aspirin clopidogrel (PLAVIX) metoprolol tartrate (LOPRESSOR)  HYDROcodone-acetaminophen (NORCO)-if needed  The night before take 45 units of LEVEMIR FLEXTOUCH at dinner time  ** PLEASE check your blood sugar the morning of your surgery when you wake up and every 2 hours until you get to the Short Stay unit.  If your blood sugar is less than 70 mg/dL, you will need to treat for low blood sugar: Do not take insulin. Treat a low blood sugar (less than 70 mg/dL) with  cup of clear juice (cranberry or apple), 4 glucose tablets, OR glucose gel. Recheck blood sugar in 15 minutes after treatment (to make sure it is greater than 70 mg/dL). If your blood sugar is not greater than 70 mg/dL on recheck, call [phone] for further instructions.   As of today, STOP taking any Aleve, Naproxen, Ibuprofen, Motrin, Advil, Goody's, BC's, all herbal medications, fish oil, and all vitamins.                      Do not wear jewelry, make up, or nail polish            Do not wear lotions, powders, perfumes/colognes, or  deodorant.            Do not shave 48 hours prior to surgery.  Men may shave face and neck.            Do not bring valuables to the hospital.            [facility] is not responsible for any belongings or valuables.  Do NOT Smoke (Tobacco/Vaping) 24 hours prior to your procedure If you use a CPAP at night, you may bring all equipment for your overnight stay.   Contacts, glasses, dentures or bridgework may not be worn into surgery.      For patients admitted to the hospital, discharge time will be determined by your treatment team.   Patients discharged the day of surgery will not be allowed to drive home, and someone needs to stay with them for 24 hours.    Special instructions:   [place]- Preparing For Surgery  Before surgery, you can play an important role. Because skin is not sterile, your skin needs to be as free of germs as possible. You can reduce the number of germs on your skin by washing with CHG (chlorahexidine gluconate) Soap before surgery.  CHG is an antiseptic cleaner which kills germs and bonds with the skin to continue killing germs even after washing.    Oral Hygiene is also  important to reduce your risk of infection.  Remember - BRUSH YOUR TEETH THE MORNING OF SURGERY WITH YOUR REGULAR TOOTHPASTE  Please do not use if you have an allergy to CHG or antibacterial soaps. If your skin becomes reddened/irritated stop using the CHG.  Do not shave (including legs and underarms) for at least 48 hours prior to first CHG shower. It is OK to shave your face.  Please follow these instructions carefully.   Shower the NIGHT BEFORE SURGERY and the MORNING OF SURGERY with DIAL Soap.   Pat yourself dry with a CLEAN TOWEL.  Wear CLEAN PAJAMAS to bed the night before surgery  Place CLEAN SHEETS on your bed the night of your first shower and DO NOT SLEEP WITH PETS.   Day of Surgery: Please shower morning of surgery  Wear Clean/Comfortable clothing the morning of surgery Do  not apply any deodorants/lotions.   Remember to brush your teeth WITH YOUR REGULAR TOOTHPASTE.   Questions were answered. Patient verbalized understanding of instructions.

## 2021-08-22 ENCOUNTER — Ambulatory Visit (HOSPITAL_COMMUNITY)
Admission: RE | Admit: 2021-08-22 | Discharge: 2021-08-22 | Disposition: A | Discharge status: Home / Self Care | Payer: Medicare HMO | Attending: Dentistry | Admitting: Dentistry

## 2021-08-22 ENCOUNTER — Encounter (HOSPITAL_COMMUNITY): Payer: Self-pay | Admitting: Dentistry

## 2021-08-22 ENCOUNTER — Ambulatory Visit (HOSPITAL_BASED_OUTPATIENT_CLINIC_OR_DEPARTMENT_OTHER): Payer: Medicare HMO | Admitting: Vascular Surgery

## 2021-08-22 ENCOUNTER — Ambulatory Visit (HOSPITAL_COMMUNITY): Payer: Medicare HMO | Admitting: Vascular Surgery

## 2021-08-22 ENCOUNTER — Encounter (HOSPITAL_COMMUNITY)
Admission: RE | Disposition: A | Discharge status: Home / Self Care | Payer: Self-pay | Source: Home / Self Care | Attending: Dentistry

## 2021-08-22 ENCOUNTER — Other Ambulatory Visit: Payer: Self-pay

## 2021-08-22 DIAGNOSIS — K036 Deposits [accretions] on teeth: Secondary | ICD-10-CM | POA: Diagnosis not present

## 2021-08-22 DIAGNOSIS — I25119 Atherosclerotic heart disease of native coronary artery with unspecified angina pectoris: Secondary | ICD-10-CM

## 2021-08-22 DIAGNOSIS — I2510 Atherosclerotic heart disease of native coronary artery without angina pectoris: Secondary | ICD-10-CM | POA: Insufficient documentation

## 2021-08-22 DIAGNOSIS — K056 Periodontal disease, unspecified: Secondary | ICD-10-CM

## 2021-08-22 DIAGNOSIS — I129 Hypertensive chronic kidney disease with stage 1 through stage 4 chronic kidney disease, or unspecified chronic kidney disease: Secondary | ICD-10-CM | POA: Diagnosis not present

## 2021-08-22 DIAGNOSIS — N189 Chronic kidney disease, unspecified: Secondary | ICD-10-CM | POA: Insufficient documentation

## 2021-08-22 DIAGNOSIS — D631 Anemia in chronic kidney disease: Secondary | ICD-10-CM | POA: Diagnosis not present

## 2021-08-22 DIAGNOSIS — Z6833 Body mass index (BMI) 33.0-33.9, adult: Secondary | ICD-10-CM | POA: Insufficient documentation

## 2021-08-22 DIAGNOSIS — K083 Retained dental root: Secondary | ICD-10-CM

## 2021-08-22 DIAGNOSIS — K219 Gastro-esophageal reflux disease without esophagitis: Secondary | ICD-10-CM | POA: Diagnosis not present

## 2021-08-22 DIAGNOSIS — Z79899 Other long term (current) drug therapy: Secondary | ICD-10-CM | POA: Insufficient documentation

## 2021-08-22 DIAGNOSIS — Z87891 Personal history of nicotine dependence: Secondary | ICD-10-CM | POA: Insufficient documentation

## 2021-08-22 DIAGNOSIS — K029 Dental caries, unspecified: Secondary | ICD-10-CM | POA: Insufficient documentation

## 2021-08-22 DIAGNOSIS — I083 Combined rheumatic disorders of mitral, aortic and tricuspid valves: Secondary | ICD-10-CM | POA: Insufficient documentation

## 2021-08-22 DIAGNOSIS — I700 Atherosclerosis of aorta: Secondary | ICD-10-CM | POA: Insufficient documentation

## 2021-08-22 DIAGNOSIS — Z7902 Long term (current) use of antithrombotics/antiplatelets: Secondary | ICD-10-CM | POA: Insufficient documentation

## 2021-08-22 DIAGNOSIS — E1122 Type 2 diabetes mellitus with diabetic chronic kidney disease: Secondary | ICD-10-CM | POA: Diagnosis not present

## 2021-08-22 DIAGNOSIS — Z7984 Long term (current) use of oral hypoglycemic drugs: Secondary | ICD-10-CM | POA: Insufficient documentation

## 2021-08-22 HISTORY — DX: Chronic kidney disease, unspecified: N18.9

## 2021-08-22 HISTORY — DX: Unspecified osteoarthritis, unspecified site: M19.90

## 2021-08-22 HISTORY — PX: MULTIPLE EXTRACTIONS WITH ALVEOLOPLASTY: SHX5342

## 2021-08-22 HISTORY — DX: Gastro-esophageal reflux disease without esophagitis: K21.9

## 2021-08-22 HISTORY — DX: Hypercalcemia: E83.52

## 2021-08-22 LAB — GLUCOSE, CAPILLARY
Glucose-Capillary: 144 mg/dL — ABNORMAL HIGH (ref 70–99)
Glucose-Capillary: 132 mg/dL — ABNORMAL HIGH (ref 70–99)
Glucose-Capillary: 141 mg/dL — ABNORMAL HIGH (ref 70–99)

## 2021-08-22 SURGERY — MULTIPLE EXTRACTION WITH ALVEOLOPLASTY
Anesthesia: General | Site: Mouth

## 2021-08-22 MED ORDER — FENTANYL CITRATE (PF) 250 MCG/5ML IJ SOLN
INTRAMUSCULAR | Status: AC
  Filled 2021-08-22: qty 5

## 2021-08-22 MED ORDER — LIDOCAINE 2% (20 MG/ML) 5 ML SYRINGE
INTRAMUSCULAR | Status: AC
  Filled 2021-08-22: qty 5

## 2021-08-22 MED ORDER — PHENYLEPHRINE 80 MCG/ML (10ML) SYRINGE FOR IV PUSH (FOR BLOOD PRESSURE SUPPORT)
PREFILLED_SYRINGE | INTRAVENOUS | Status: DC | PRN
  Administered 2021-08-22 (×5): 80 ug via INTRAVENOUS
  Administered 2021-08-22: 160 ug via INTRAVENOUS
  Administered 2021-08-22 (×3): 80 ug via INTRAVENOUS

## 2021-08-22 MED ORDER — DEXAMETHASONE SODIUM PHOSPHATE 10 MG/ML IJ SOLN
INTRAMUSCULAR | Status: DC | PRN
  Administered 2021-08-22: 5 mg via INTRAVENOUS

## 2021-08-22 MED ORDER — HYDROCODONE-ACETAMINOPHEN 5-325 MG PO TABS
1.0000 | ORAL_TABLET | Freq: Four times a day (QID) | ORAL | 0 refills | Status: AC | PRN
Start: 2021-08-22 — End: 2021-08-25

## 2021-08-22 MED ORDER — ONDANSETRON HCL 4 MG/2ML IJ SOLN
INTRAMUSCULAR | Status: AC
Start: 2021-08-22 — End: ?
  Filled 2021-08-22: qty 2

## 2021-08-22 MED ORDER — HEMOSTATIC AGENTS (NO CHARGE) OPTIME
TOPICAL | Status: DC | PRN
  Administered 2021-08-22: 1 via TOPICAL

## 2021-08-22 MED ORDER — PHENYLEPHRINE HCL-NACL 20-0.9 MG/250ML-% IV SOLN
INTRAVENOUS | Status: DC | PRN
  Administered 2021-08-22: 30 ug/min via INTRAVENOUS

## 2021-08-22 MED ORDER — PROPOFOL 10 MG/ML IV BOLUS
INTRAVENOUS | Status: AC
  Filled 2021-08-22: qty 20

## 2021-08-22 MED ORDER — OXYMETAZOLINE HCL 0.05 % NA SOLN
NASAL | Status: AC
  Filled 2021-08-22: qty 30

## 2021-08-22 MED ORDER — CHLORHEXIDINE GLUCONATE 0.12 % MT SOLN
OROMUCOSAL | Status: AC
  Administered 2021-08-22: 15 mL
  Filled 2021-08-22: qty 15

## 2021-08-22 MED ORDER — ROCURONIUM BROMIDE 10 MG/ML (PF) SYRINGE
PREFILLED_SYRINGE | INTRAVENOUS | Status: DC | PRN
  Administered 2021-08-22: 40 mg via INTRAVENOUS

## 2021-08-22 MED ORDER — ONDANSETRON HCL 4 MG/2ML IJ SOLN
INTRAMUSCULAR | Status: DC | PRN
  Administered 2021-08-22: 4 mg via INTRAVENOUS

## 2021-08-22 MED ORDER — LACTATED RINGERS IV SOLN: INTRAVENOUS | Status: DC | PRN

## 2021-08-22 MED ORDER — SUGAMMADEX SODIUM 200 MG/2ML IV SOLN
INTRAVENOUS | Status: DC | PRN
  Administered 2021-08-22: 200 mg via INTRAVENOUS

## 2021-08-22 MED ORDER — CEFAZOLIN SODIUM-DEXTROSE 2-4 GM/100ML-% IV SOLN
2.0000 g | INTRAVENOUS | Status: AC
  Administered 2021-08-22: 2 g via INTRAVENOUS
  Filled 2021-08-22: qty 100

## 2021-08-22 MED ORDER — LIDOCAINE 2% (20 MG/ML) 5 ML SYRINGE
INTRAMUSCULAR | Status: DC | PRN
  Administered 2021-08-22: 100 mg via INTRAVENOUS

## 2021-08-22 MED ORDER — INSULIN ASPART 100 UNIT/ML IJ SOLN: 0.0000 [IU] | INTRAMUSCULAR | Status: DC | PRN

## 2021-08-22 MED ORDER — DEXAMETHASONE SODIUM PHOSPHATE 10 MG/ML IJ SOLN
INTRAMUSCULAR | Status: AC
  Filled 2021-08-22: qty 1

## 2021-08-22 MED ORDER — LIDOCAINE-EPINEPHRINE 2 %-1:100000 IJ SOLN
INTRAMUSCULAR | Status: DC | PRN
  Administered 2021-08-22: 2 mL via INTRADERMAL

## 2021-08-22 MED ORDER — AMISULPRIDE (ANTIEMETIC) 5 MG/2ML IV SOLN: 10.0000 mg | Freq: Once | INTRAVENOUS | Status: DC | PRN

## 2021-08-22 MED ORDER — FENTANYL CITRATE (PF) 250 MCG/5ML IJ SOLN
INTRAMUSCULAR | Status: DC | PRN
  Administered 2021-08-22 (×2): 50 ug via INTRAVENOUS

## 2021-08-22 MED ORDER — FENTANYL CITRATE (PF) 100 MCG/2ML IJ SOLN: 25.0000 ug | INTRAMUSCULAR | Status: DC | PRN

## 2021-08-22 MED ORDER — ROCURONIUM BROMIDE 10 MG/ML (PF) SYRINGE
PREFILLED_SYRINGE | INTRAVENOUS | Status: AC
  Filled 2021-08-22: qty 10

## 2021-08-22 MED ORDER — PROPOFOL 10 MG/ML IV BOLUS
INTRAVENOUS | Status: DC | PRN
  Administered 2021-08-22: 30 mg via INTRAVENOUS
  Administered 2021-08-22: 110 mg via INTRAVENOUS

## 2021-08-22 MED ORDER — 0.9 % SODIUM CHLORIDE (POUR BTL) OPTIME
TOPICAL | Status: DC | PRN
  Administered 2021-08-22: 1000 mL

## 2021-08-22 MED ORDER — ACETAMINOPHEN 500 MG PO TABS
1000.0000 mg | ORAL_TABLET | Freq: Once | ORAL | Status: AC
  Administered 2021-08-22: 1000 mg via ORAL
  Filled 2021-08-22: qty 2

## 2021-08-22 MED ORDER — PHENYLEPHRINE 80 MCG/ML (10ML) SYRINGE FOR IV PUSH (FOR BLOOD PRESSURE SUPPORT)
PREFILLED_SYRINGE | INTRAVENOUS | Status: AC
  Filled 2021-08-22: qty 10

## 2021-08-22 MED ORDER — LIDOCAINE-EPINEPHRINE 2 %-1:100000 IJ SOLN
INTRAMUSCULAR | Status: AC
  Filled 2021-08-22: qty 10.2

## 2021-08-22 MED ORDER — BUPIVACAINE-EPINEPHRINE (PF) 0.5% -1:200000 IJ SOLN
INTRAMUSCULAR | Status: AC
  Filled 2021-08-22: qty 3.6

## 2021-08-22 SURGICAL SUPPLY — 37 items
ALCOHOL 70% 16 OZ (MISCELLANEOUS) ×1 IMPLANT
BAG COUNTER SPONGE SURGICOUNT (BAG) ×1 IMPLANT
BAG SPNG CNTER NS LX DISP (BAG) ×1
BLADE SURG 15 STRL LF DISP TIS (BLADE) ×1 IMPLANT
BLADE SURG 15 STRL SS (BLADE) ×1
COVER SURGICAL LIGHT HANDLE (MISCELLANEOUS) ×1 IMPLANT
GAUZE 4X4 16PLY ~~LOC~~+RFID DBL (SPONGE) ×1 IMPLANT
GAUZE PACKING FOLDED 2  STR (GAUZE/BANDAGES/DRESSINGS) ×1
GAUZE PACKING FOLDED 2 STR (GAUZE/BANDAGES/DRESSINGS) ×1 IMPLANT
GLOVE SURG ENC MOIS LTX SZ6.5 (GLOVE) ×1 IMPLANT
GLOVE SURG POLYISO LF SZ6 (GLOVE) ×1 IMPLANT
GOWN STRL REUS W/ TWL LRG LVL3 (GOWN DISPOSABLE) ×2 IMPLANT
GOWN STRL REUS W/TWL LRG LVL3 (GOWN DISPOSABLE) ×2
KIT BASIN OR (CUSTOM PROCEDURE TRAY) ×1 IMPLANT
KIT TURNOVER KIT B (KITS) ×1 IMPLANT
MANIFOLD NEPTUNE II (INSTRUMENTS) ×1 IMPLANT
NDL BLUNT 16X1.5 OR ONLY (NEEDLE) ×1 IMPLANT
NDL DENTAL 27 LONG (NEEDLE) ×2 IMPLANT
NEEDLE BLUNT 16X1.5 OR ONLY (NEEDLE) ×1 IMPLANT
NEEDLE DENTAL 27 LONG (NEEDLE) ×2 IMPLANT
NS IRRIG 1000ML POUR BTL (IV SOLUTION) ×1 IMPLANT
PACK EENT II TURBAN DRAPE (CUSTOM PROCEDURE TRAY) ×1 IMPLANT
PAD ARMBOARD 7.5X6 YLW CONV (MISCELLANEOUS) ×1 IMPLANT
SPONGE SURGIFOAM ABS GEL 100 (HEMOSTASIS) IMPLANT
SPONGE SURGIFOAM ABS GEL 12-7 (HEMOSTASIS) IMPLANT
SPONGE SURGIFOAM ABS GEL SZ50 (HEMOSTASIS) IMPLANT
SUCTION FRAZIER HANDLE 10FR (MISCELLANEOUS) ×1
SUCTION TUBE FRAZIER 10FR DISP (MISCELLANEOUS) ×1 IMPLANT
SUT CHROMIC 3 0 PS 2 (SUTURE) ×2 IMPLANT
SUT CHROMIC 4 0 P 3 18 (SUTURE) IMPLANT
SYR 50ML SLIP (SYRINGE) ×1 IMPLANT
SYR BULB IRRIG 60ML STRL (SYRINGE) ×1 IMPLANT
TOWEL GREEN STERILE FF (TOWEL DISPOSABLE) ×1 IMPLANT
TUBE CONNECTING 12X1/4 (SUCTIONS) ×1 IMPLANT
WATER STERILE IRR 1000ML POUR (IV SOLUTION) ×1 IMPLANT
WATER TABLETS ICX (MISCELLANEOUS) ×1 IMPLANT
YANKAUER SUCT BULB TIP NO VENT (SUCTIONS) ×1 IMPLANT

## 2021-08-22 NOTE — Anesthesia Procedure Notes (Signed)
Procedure Name: Intubation Date/Time: 08/22/2021 12:31 PM  Performed by: Harden Mo, CRNAPre-anesthesia Checklist: Patient identified, Emergency Drugs available, Suction available and Patient being monitored Patient Re-evaluated:Patient Re-evaluated prior to induction Oxygen Delivery Method: Circle System Utilized Preoxygenation: Pre-oxygenation with 100% oxygen Induction Type: IV induction Ventilation: Mask ventilation without difficulty, Oral airway inserted - appropriate to patient size and Two handed mask ventilation required Laryngoscope Size: Miller and 2 Grade View: Grade I Tube type: Oral Tube size: 7.5 mm Number of attempts: 1 Airway Equipment and Method: Stylet and Oral airway Placement Confirmation: ETT inserted through vocal cords under direct vision, positive ETCO2 and breath sounds checked- equal and bilateral Secured at: 23 cm Tube secured with: Tape Dental Injury: Teeth and Oropharynx as per pre-operative assessment

## 2021-08-22 NOTE — Interval H&P Note (Signed)
History and Physical Interval Note:  08/22/2021   William [name]  has presented today for surgery, with the diagnosis of dental caries.  The various methods of treatment have been discussed with the patient and family. After consideration of risks, benefits and other options for treatment, the patient has consented to the procedure(s): MULTIPLE EXTRACTIONS WITH ALVEOLOPLASTY as a surgical intervention.  The patient's history has been reviewed, patient examined, no change in status, stable for surgery.  I have reviewed the patient's chart and labs.  Questions were answered to the patient's satisfaction.     -Sandi Mariscal, [name]

## 2021-08-22 NOTE — Transfer of Care (Signed)
Immediate Anesthesia Transfer of Care Note  Patient: William [name]  Procedure(s) Performed: MULTIPLE EXTRACTION WITH ALVEOLOPLASTY (Mouth)  Patient Location: PACU  Anesthesia Type:General  Level of Consciousness: awake, alert  and oriented  Airway & Oxygen Therapy: Patient Spontanous Breathing  Post-op Assessment: Report given to RN, Post -op Vital signs reviewed and stable and Patient moving all extremities X 4  Post vital signs: Reviewed and stable  Last Vitals:  Vitals Value Taken Time  BP 122/58 08/22/21 1335  Temp    Pulse 70 08/22/21 1336  Resp 18 08/22/21 1336  SpO2 92 % 08/22/21 1336  Vitals shown include unvalidated device data.  Last Pain:  Vitals:   08/22/21 1034  TempSrc:   PainSc: 0-No pain         Complications: No notable events documented.

## 2021-08-22 NOTE — Discharge Instructions (Signed)
[facility] DEPARTMENT OF DENTAL MEDICINE Dr. Ireene Ballowe B. Liviya Santini, D.M.D. Phone: [phone] Fax: [phone]   MOUTH CARE AFTER SURGERY    FACTS: Ice used in ice bag helps keep the swelling down, and can help lessen the pain for the first 24 hours after surgery. It is easier to treat pain BEFORE it happens. Spitting disturbs the clot and may cause bleeding to start again, or to get worse. Smoking delays healing and can cause complications. Sharing prescriptions can be dangerous.  Do not take medications not recently prescribed for you. Antibiotics may stop birth control pills from working.  Use other means of birth control while on antibiotics. Warm salt water rinses after the first 24 hours will help lessen the swelling:  Use 1/2 teaspoonful of table salt per oz.of water.    DO NOT: Spit Drink through a straw It is strongly advised not to smoke, dip snuff or chew tobacco for at least 3 days. Eat sharp or crunchy foods.  Avoid the area of surgery when chewing. Stop your antibiotics before your instructions say to do so. Eat hot foods until bleeding has stopped.  If you need to, let your food cool down to room temperature.      WHAT TO EXPECT: Some swelling, especially during the first 2-3 days. Soreness or discomfort in varying degrees.  Follow your dentist's instructions about how to handle pain before it starts. Pinkish saliva or light blood in saliva, or on your pillow in the morning.  This can last around 24 hours. Bruising inside or outside the mouth.  This may not show up until 2-3 days after surgery.  Don't worry, it will go away in time. Pieces of "bone" may work themselves loose.  It's OK.  If they bother you, let us know.     WHAT TO DO IMMEDIATELY AFTER SURGERY: Bite on gauze with steady pressure for 30-45 minutes at a time.  Switch out the gauze after 30-45 minutes for clean gauze, and continue this for 1-2 hours or until bleeding  subsides. Do not chew on the gauze. Do not lie down flat.  Raise your head support especially for the first 24 hours. Apply ice to your face on the side of the surgery.  You may apply it 20 minutes on and a few minutes off.  Ice for 8-12 hours.  You may use ice up to 24 hours. Before the numbness wears off, take a pain pill as instructed. Prescription pain medication is not always required.     SWELLING: Expect swelling for the first couple of days.  It should get better after that. If swelling increases 3 days or so after surgery, let us know as soon as possible.    FEVER: Take Tylenol every 4 hours if needed to lower your temperature, especially if it is at 100oF or higher. Drink lots of fluids. If the fever does not go away, let us know.    BREATHING: Any unusual difficulty breathing means you have to have someone bring you to the emergency room ASAP.    BLEEDING: Light oozing is expected for 24 hours or so. Prop head up with pillows. Do not spit. Do not confuse bright red fresh flowing blood with lots of saliva colored with a little bit of blood. If you notice some bleeding, place gauze or a tea bag where it is bleeding and apply CONSTANT pressure by biting down for 1 hour.  Avoid talking during this time.  Do not   remove the gauze or tea bag during this hour to "check" the bleeding. If you notice bright RED bleeding FLOWING out of particular area, and filling the floor of your mouth, put a wad of gauze on that area, bite down firmly and constantly.  Call us immediately.  If we're closed, have someone bring you to the emergency room.     ORAL HYGIENE: Brush your teeth as usual after meals and before bedtime. Use a soft toothbrush around the area of surgery. DO NOT AVOID BRUSHING.  Otherwise bacteria(germs) will grow and may delay healing or encourage infection. Since you cannot spit, just gently rinse and let the water flow out of your mouth. DO NOT SWISH HARD.      EATING: Cool liquids are a good point to start.  Increase to soft foods as tolerated.     PRESCRIPTIONS: Follow the directions for your prescriptions exactly as written. If your doctor gave you a narcotic pain medication, do not drive, operate machinery or drink alcohol when on that medication.    Questions?  Call our office during office hours at [phone] or call the Emergency Room at [phone].  

## 2021-08-22 NOTE — Op Note (Signed)
[facility] Department of Dental Medicine   OPERATIVE REPORT  DATE OF SURGERY:   08/22/2021  PATIENT'S NAME:   William [name] DATE OF BIRTH:   1945/09/14 MEDICAL RECORD NUMBER:  [id number]  SURGEON:   Shawnn Bouillon B. Lathon Adan, [name]  ASSISTANT:   Molli Posey, DAII  PREOPERATIVE DIAGNOSES:  Dental caries, periodontal disease  Patient Active Problem List   Diagnosis Date Noted   Teeth missing 08/14/2021   Caries 08/14/2021   Retained tooth root 08/14/2021   Loose, teeth 08/14/2021   Excessive dental attrition 08/14/2021   Phobia of dental procedure 08/14/2021   Long term (current) use of antithrombotics/antiplatelets 08/14/2021   Chronic apical periodontitis 08/14/2021   Chronic periodontitis 08/14/2021   Atrophy of edentulous alveolar ridge 08/14/2021   Accretions on teeth 08/14/2021   Encounter for preoperative dental examination 08/13/2021   Low ferritin 08/13/2021   Severe aortic stenosis    Unstable angina (HCC)    Age-related osteoporosis without current pathological fracture 04/15/2021   Other hyperparathyroidism ([place]) 02/12/2021   Stopped smoking with greater than 40 pack year history 02/12/2021   Hypercalcemia 01/24/2021   Iron deficiency anemia 05/26/2019   Personal history of colonic polyps 08/18/2018   Family history of colon cancer 01/21/2018   History of coronary artery disease    Gastroesophageal reflux disease    Chronic diastolic heart failure ([place])    CKD stage 3 due to type 2 diabetes mellitus ([place])    Abdominal wall abscess at site of surgical wound 02/26/2015   Abscess of abdominal wall 02/26/2015   Coronary artery disease 08/24/2013   Chest pain 08/10/2013   Hyperlipidemia 08/10/2013   DM (diabetes mellitus) ([place]) 08/10/2013   HTN (hypertension) 08/10/2013   Solitary pulmonary nodule 08/10/2013    POSTOPERATIVE DIAGNOSES:  Dental caries, periodontal disease   PROCEDURES PERFORMED: Extractions of teeth numbers 20, 23, 28, 29  and 31 Gross debridement of remaining dentition  ANESTHESIA:  General anesthesia via endotracheal tube.  MEDICATIONS: Ancef 2 g IV prior to invasive dental procedures. Local anesthesia with a total utilization of 2 cartridges of 34 mg of lidocaine with 0.018 mg of epinephrine/ea.  SPECIMENS:  5 teeth that were extracted and discarded  DRAINS/CULTURES:  None  COMPLICATIONS:  None  ESTIMATED BLOOD LOSS:  5 mL  INTRAVENOUS FLUIDS:  10 mL of Lactated ringers solution  INDICATIONS:  The patient was recently diagnosed with severe aortic stenosis.  A medically necessary dental consult was then requested to evaluate the patient for any dental/orofacial infection and their overall oral health.  The patient was examined and subsequently treatment planned for multiple extractions of grossly decayed and infected teeth.  This treatment plan was made to decrease the perioperative and postoperative risks and complications associated with dental/orofacial infection from affecting the patient's systemic health.  OPERATIVE FINDINGS:  The patient was examined in operating room number 10.  The indicated teeth were identified and verified for extraction. The patient was noted be affected by severe dental decay, retained tooth roots and periodontal disease.  DESCRIPTION OF PROCEDURE:  The patient was identified in the holding area and brought to the main operating room number 10 by the anesthesia team. The patient was then placed in the supine position on the operating table.  General anesthesia was then induced per the anesthesia team. The patient was then prepped and draped in the usual sterile fashion for dental medicine procedures.  A timeout was performed. The patient was identified and procedures were verified. A  throat pack was placed at this time. The oral cavity was then thoroughly examined with the findings noted above. The patient was then ready for the dental medicine procedure as  follows:   ANESTHESIA: Local anesthesia was administered sequentially with a total utilization of 2 cartridges each containing 34 mg of lidocaine with 0.018 mg of epinephrine.  Location of anesthesia included lower right and left quadrants mental nerve block, infiltration and lingual.  Aspiration negative.  ROUTINE EXTRACTIONS: The mandibular left and right quadrants were then approached. The teeth were subluxated with a series of straight elevators.  Teeth numbers 20, 23, 28, 29 and 31 were then removed utilizing a 151 forceps and rongeurs without complications. The tissues were approximated and trimmed appropriately to help achieve primary closure. The surgical sites were then irrigated with copious amounts of sterile saline.  Surgi Foam was placed in each extraction site.  The surgical sites were closed using 3-0 chromic gut sutures as follows: 5 simple interrupted sytle sutures.  FULL MOUTH DEBRIDEMENT: Removed all supra- and most subgingival calculus and plaque from remaining dentition using Kavo and hand instruments with copious amounts of sterile irrigation.   END OF PROCEDURE: Thorough oral irrigation with sterile saline was performed.  Hemostasis was observed.  The patient was examined for complications, and seeing none, the dental medicine procedure was deemed to be complete.  The throat pack was removed at this time.  A series of 4x4 gauze were placed in the mouth to aid hemostasis as needed.  The patient was then handed over to the anesthesia team for final disposition.  After an appropriate amount of time, the patient was extubated and taken to the postanesthsia care unit in stable condition.  All counts were correct for the dental medicine procedure.    Mexia Benson Norway, [name]    Phone: [phone]    Pager:  [phone]

## 2021-08-23 ENCOUNTER — Encounter (HOSPITAL_COMMUNITY): Payer: Self-pay | Admitting: Dentistry

## 2021-08-23 NOTE — Anesthesia Postprocedure Evaluation (Signed)
Anesthesia Post Note  Patient: William [name]  Procedure(s) Performed: MULTIPLE EXTRACTION WITH ALVEOLOPLASTY (Mouth)     Patient location during evaluation: PACU Anesthesia Type: General Level of consciousness: sedated and patient cooperative Pain management: pain level controlled Vital Signs Assessment: post-procedure vital signs reviewed and stable Respiratory status: spontaneous breathing Cardiovascular status: stable Anesthetic complications: no   No notable events documented.  Last Vitals:  Vitals:   08/22/21 1415 08/22/21 1430  BP: (!) 112/56 (!) 119/58  Pulse: 65 63  Resp: (!) 9 (!) 9  Temp:  36.4 C  SpO2: 92% 95%    Last Pain:  Vitals:   08/22/21 1430  TempSrc:   PainSc: 0-No pain                 Nolon Nations

## 2021-08-30 ENCOUNTER — Ambulatory Visit (INDEPENDENT_AMBULATORY_CARE_PROVIDER_SITE_OTHER): Payer: Medicare HMO | Admitting: Dentistry

## 2021-08-30 VITALS — BP 128/60 | HR 62 | Temp 98.4°F

## 2021-08-30 DIAGNOSIS — K08199 Complete loss of teeth due to other specified cause, unspecified class: Secondary | ICD-10-CM
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
## 2021-09-02 ENCOUNTER — Emergency Department (HOSPITAL_COMMUNITY): Payer: Medicare HMO

## 2021-09-02 ENCOUNTER — Telehealth: Payer: Self-pay

## 2021-09-02 ENCOUNTER — Encounter (HOSPITAL_COMMUNITY): Payer: Self-pay | Admitting: Emergency Medicine

## 2021-09-02 ENCOUNTER — Encounter (HOSPITAL_COMMUNITY): Payer: Self-pay

## 2021-09-02 ENCOUNTER — Observation Stay (HOSPITAL_COMMUNITY)
Admission: EM | Admit: 2021-09-02 | Discharge: 2021-09-02 | Disposition: A | Discharge status: Home / Self Care | Payer: Medicare HMO | Attending: Cardiology | Admitting: Cardiology

## 2021-09-02 ENCOUNTER — Ambulatory Visit: Payer: Medicare HMO | Attending: Medical

## 2021-09-02 ENCOUNTER — Other Ambulatory Visit: Payer: Self-pay | Admitting: Medical

## 2021-09-02 ENCOUNTER — Other Ambulatory Visit: Payer: Self-pay

## 2021-09-02 ENCOUNTER — Other Ambulatory Visit (HOSPITAL_COMMUNITY): Payer: Medicare HMO

## 2021-09-02 DIAGNOSIS — R000 Tachycardia, unspecified: Principal | ICD-10-CM | POA: Insufficient documentation

## 2021-09-02 DIAGNOSIS — E1122 Type 2 diabetes mellitus with diabetic chronic kidney disease: Secondary | ICD-10-CM | POA: Diagnosis not present

## 2021-09-02 DIAGNOSIS — I130 Hypertensive heart and chronic kidney disease with heart failure and stage 1 through stage 4 chronic kidney disease, or unspecified chronic kidney disease: Secondary | ICD-10-CM | POA: Insufficient documentation

## 2021-09-02 DIAGNOSIS — R0789 Other chest pain: Secondary | ICD-10-CM | POA: Diagnosis not present

## 2021-09-02 DIAGNOSIS — Z79899 Other long term (current) drug therapy: Secondary | ICD-10-CM | POA: Insufficient documentation

## 2021-09-02 DIAGNOSIS — I480 Paroxysmal atrial fibrillation: Secondary | ICD-10-CM | POA: Diagnosis not present

## 2021-09-02 DIAGNOSIS — Z794 Long term (current) use of insulin: Secondary | ICD-10-CM | POA: Insufficient documentation

## 2021-09-02 DIAGNOSIS — Z7984 Long term (current) use of oral hypoglycemic drugs: Secondary | ICD-10-CM | POA: Insufficient documentation

## 2021-09-02 DIAGNOSIS — R778 Other specified abnormalities of plasma proteins: Secondary | ICD-10-CM | POA: Insufficient documentation

## 2021-09-02 DIAGNOSIS — Z7902 Long term (current) use of antithrombotics/antiplatelets: Secondary | ICD-10-CM | POA: Diagnosis not present

## 2021-09-02 DIAGNOSIS — Z955 Presence of coronary angioplasty implant and graft: Secondary | ICD-10-CM | POA: Insufficient documentation

## 2021-09-02 DIAGNOSIS — I350 Nonrheumatic aortic (valve) stenosis: Secondary | ICD-10-CM

## 2021-09-02 DIAGNOSIS — N189 Chronic kidney disease, unspecified: Secondary | ICD-10-CM | POA: Diagnosis not present

## 2021-09-02 DIAGNOSIS — I2510 Atherosclerotic heart disease of native coronary artery without angina pectoris: Secondary | ICD-10-CM | POA: Insufficient documentation

## 2021-09-02 DIAGNOSIS — Z7982 Long term (current) use of aspirin: Secondary | ICD-10-CM | POA: Diagnosis not present

## 2021-09-02 DIAGNOSIS — I5032 Chronic diastolic (congestive) heart failure: Secondary | ICD-10-CM | POA: Diagnosis not present

## 2021-09-02 DIAGNOSIS — Z87891 Personal history of nicotine dependence: Secondary | ICD-10-CM | POA: Insufficient documentation

## 2021-09-02 DIAGNOSIS — I214 Non-ST elevation (NSTEMI) myocardial infarction: Secondary | ICD-10-CM | POA: Diagnosis not present

## 2021-09-02 DIAGNOSIS — R55 Syncope and collapse: Secondary | ICD-10-CM

## 2021-09-02 DIAGNOSIS — Z8546 Personal history of malignant neoplasm of prostate: Secondary | ICD-10-CM | POA: Insufficient documentation

## 2021-09-02 LAB — TROPONIN I (HIGH SENSITIVITY)
Troponin I (High Sensitivity): 37 ng/L — ABNORMAL HIGH (ref <18)
Troponin I (High Sensitivity): 62 ng/L — ABNORMAL HIGH (ref <18)
Troponin I (High Sensitivity): 99 ng/L — ABNORMAL HIGH (ref <18)

## 2021-09-02 LAB — CBC WITH DIFFERENTIAL/PLATELET
Abs Immature Granulocytes: 0.01 10*3/uL (ref 0.00–0.07)
Basophils Absolute: 0 10*3/uL (ref 0.0–0.1)
Basophils Relative: 0 %
Eosinophils Absolute: 0.4 10*3/uL (ref 0.0–0.5)
Eosinophils Relative: 5 %
HCT: 41.1 % (ref 39.0–52.0)
Hemoglobin: 13.2 g/dL (ref 13.0–17.0)
Immature Granulocytes: 0 %
Lymphocytes Relative: 19 %
Lymphs Abs: 1.4 10*3/uL (ref 0.7–4.0)
MCH: 29.6 pg (ref 26.0–34.0)
MCHC: 32.1 g/dL (ref 30.0–36.0)
MCV: 92.2 fL (ref 80.0–100.0)
Monocytes Absolute: 0.7 10*3/uL (ref 0.1–1.0)
Monocytes Relative: 10 %
Neutro Abs: 5 10*3/uL (ref 1.7–7.7)
Neutrophils Relative %: 66 %
Platelets: 198 10*3/uL (ref 150–400)
RBC: 4.46 MIL/uL (ref 4.22–5.81)
RDW: 16 % — ABNORMAL HIGH (ref 11.5–15.5)
WBC: 7.5 10*3/uL (ref 4.0–10.5)
nRBC: 0 % (ref 0.0–0.2)

## 2021-09-02 LAB — BASIC METABOLIC PANEL
Anion gap: 8 (ref 5–15)
BUN: 26 mg/dL — ABNORMAL HIGH (ref 8–23)
CO2: 24 mmol/L (ref 22–32)
Calcium: 9.5 mg/dL (ref 8.9–10.3)
Chloride: 111 mmol/L (ref 98–111)
Creatinine, Ser: 1.2 mg/dL (ref 0.61–1.24)
GFR, Estimated: >60 mL/min (ref >60)
Glucose, Bld: 121 mg/dL — ABNORMAL HIGH (ref 70–99)
Potassium: 3.5 mmol/L (ref 3.5–5.1)
Sodium: 143 mmol/L (ref 135–145)

## 2021-09-02 LAB — D-DIMER, QUANTITATIVE: D-Dimer, Quant: <0.27 ug/mL-FEU (ref 0.00–0.50)

## 2021-09-02 LAB — TSH: TSH: 0.916 u[IU]/mL (ref 0.350–4.500)

## 2021-09-02 MED ORDER — ASPIRIN 81 MG PO CHEW
324.0000 mg | CHEWABLE_TABLET | Freq: Once | ORAL | Status: AC
  Administered 2021-09-02: 324 mg via ORAL
  Filled 2021-09-02: qty 4

## 2021-09-02 MED ORDER — HEPARIN (PORCINE) 25000 UT/250ML-% IV SOLN
1150.0000 [IU]/h | INTRAVENOUS | Status: DC
  Administered 2021-09-02: 1150 [IU]/h via INTRAVENOUS
  Filled 2021-09-02: qty 250

## 2021-09-02 MED ORDER — HEPARIN BOLUS VIA INFUSION
4000.0000 [IU] | Freq: Once | INTRAVENOUS | Status: AC
  Administered 2021-09-02: 4000 [IU] via INTRAVENOUS

## 2021-09-02 NOTE — ED Provider Notes (Signed)
[facility] EMERGENCY DEPARTMENT Provider Note   CSN: [id number] Arrival date & time: 09/02/21  0400     History  Chief Complaint  Patient presents with   Tachycardia    William [name] is a 76 y.o. male.  The history is provided by the patient.  He has history of hypertension, diabetes, hyperlipidemia, coronary artery disease, diastolic heart failure, chronic kidney disease, hyperparathyroidism and comes in because of a rapid heart rate.  He woke up and felt hot so he checked his blood pressure and noted his heart rate was elevated at 118.  He denies chest pain, heaviness, tightness, pressure.  He denies dyspnea, nausea, vomiting, diaphoresis.  He continues to feel hot.  Of note, he is being evaluated for TAVR procedure.  He did have recent coronary stent placement.   Home Medications Prior to Admission medications   Medication Sig Start Date End Date Taking? Authorizing Provider  acarbose (PRECOSE) 100 MG tablet Take 100 mg by mouth 3 (three) times daily with meals.     [provider]  ACCU-CHEK GUIDE test strip  05/21/21   [provider]  Accu-Chek Softclix Lancets lancets 1 each 3 (three) times daily. 05/20/21   [provider]  Acetylcysteine (NAC 600) 600 MG CAPS Take 600 mg by mouth daily.    [provider]  alendronate (FOSAMAX) 70 MG tablet Take 1 tablet (70 mg total) by mouth every 7 (seven) days. Take with a full glass of water on an empty stomach. 04/15/21   Cassandria Anger, MD  alfuzosin (UROXATRAL) 10 MG 24 hr tablet Take 1 tablet (10 mg total) by mouth daily with breakfast. 04/29/21   McKenzie, Candee Furbish, MD  amLODipine (NORVASC) 5 MG tablet TAKE 1 TABLET(5 MG) BY MOUTH DAILY 11/30/17   Herminio Commons, MD  Ascorbic Acid (VITAMIN C) 1000 MG tablet Take 1,000 mg by mouth every morning.    [provider]  aspirin EC 81 MG tablet Take 81 mg by mouth daily.     [provider]  Cholecalciferol (VITAMIN D3) 125  MCG (5000 UT) TABS Take 5,000 Units by mouth daily.    [provider]  clopidogrel (PLAVIX) 75 MG tablet Take 1 tablet (75 mg total) by mouth daily. 08/07/21 08/07/22  [name], MD  Coenzyme Q10 (COQ10) 100 MG CAPS Take 100 mg by mouth daily.    [provider]  FARXIGA 10 MG TABS tablet Take 10 mg by mouth daily.  12/01/17   [provider]  ferrous sulfate 325 (65 FE) MG EC tablet Take 1 tablet (325 mg total) by mouth daily with breakfast. 08/13/21 08/13/22  Erenest Rasher, PA-C  furosemide (LASIX) 20 MG tablet Take 20 mg by mouth 2 (two) times daily.  10/11/14   [provider]  glipiZIDE (GLUCOTROL) 10 MG tablet Take 10 mg by mouth 2 (two) times daily. 08/02/13   [provider]  Glucosamine-Chondroitin (MOVE FREE PO) Take 1 tablet by mouth 2 (two) times daily.    [provider]  LEVEMIR FLEXTOUCH 100 UNIT/ML Pen Inject 90 Units into the skin at bedtime.  07/08/16   [provider]  lisinopril (ZESTRIL) 20 MG tablet Take 1 tablet (20 mg total) by mouth daily. 03/28/19   Herminio Commons, MD  Magnesium 400 MG TABS Take 400 mg by mouth daily.    [provider]  Menatetrenone (VITAMIN K2) 100 MCG TABS Take 100 mcg by mouth daily.  [provider]  metFORMIN (GLUCOPHAGE) 1000 MG tablet Take 1,000 mg by mouth 2 (two) times daily. 09/14/14   [provider]  metFORMIN (GLUCOPHAGE-XR) 500 MG 24 hr tablet Take 500 mg by mouth every evening. Take with 1000 Metformin    [provider]  metoprolol tartrate (LOPRESSOR) 25 MG tablet Take 1 tablet (25 mg total) by mouth daily. 07/26/19   Strader, Fransisco Hertz, PA-C  nitroGLYCERIN (NITROSTAT) 0.4 MG SL tablet Place 1 tablet (0.4 mg total) under the tongue every 5 (five) minutes x 3 doses as needed for chest pain (if no relief after 2nd dose, proceed to the ED for an evalution or call 911). 07/02/20   Verta Ellen., NP  pantoprazole (PROTONIX) 40 MG  tablet Take 40 mg by mouth every evening.  01/04/18   [provider]  potassium citrate (UROCIT-K) 5 MEQ (540 MG) SR tablet Take 5 mEq by mouth 3 (three) times daily with meals. 03/28/21   [provider]  Quercetin 500 MG CAPS Take 1,000 mg by mouth daily.    [provider]  rosuvastatin (CRESTOR) 20 MG tablet Take 20 mg by mouth every evening.     [provider]  tamsulosin (FLOMAX) 0.4 MG CAPS capsule Take 0.4 mg by mouth daily after supper. 08/02/13   [provider]  trolamine salicylate (BLUE-EMU HEMP) 10 % cream Apply 1 application topically as needed for muscle pain.    [provider]  vitamin B-12 (CYANOCOBALAMIN) 1000 MCG tablet Take 1,000 mcg by mouth daily.    [provider]  zinc gluconate 50 MG tablet Take 50 mg by mouth daily.    [provider]      Allergies    Patient has no known allergies.    Review of Systems   Review of Systems  All other systems reviewed and are negative.   Physical Exam Updated Vital Signs BP 134/70 (BP Location: Right Arm)   Pulse 100   Temp 98.5 F (36.9 C) (Oral)   Resp 20   Ht '5\' 9"'$  (1.753 m)   Wt 104.3 kg   SpO2 96%   BMI 33.97 kg/m  Physical Exam Vitals and nursing note reviewed.   76 year old male, resting comfortably and in no acute distress. Vital signs are normal. Oxygen saturation is 96%, which is normal. Head is normocephalic and atraumatic. PERRLA, EOMI. Oropharynx is clear. Neck is nontender and supple without adenopathy or JVD. Back is nontender and there is no CVA tenderness. Lungs are clear without rales, wheezes, or rhonchi. Chest is nontender. Heart has regular rate and rhythm with 1-2/6 high-pitched systolic ejection murmur heard at the aortic area consistent with aortic stenosis. Abdomen is soft, flat, nontender. Extremities have no cyanosis or edema, full range of motion is present. Skin is warm and dry without rash. Neurologic: Mental  status is normal, cranial nerves are intact, moves all extremities equally.  ED Results / Procedures / Treatments   Labs (all labs ordered are listed, but only abnormal results are displayed) Labs Reviewed  CBC WITH DIFFERENTIAL/PLATELET - Abnormal; Notable for the following components:      Result Value   RDW 16.0 (*)    All other components within normal limits  BASIC METABOLIC PANEL - Abnormal; Notable for the following components:   Glucose, Bld 121 (*)    BUN 26 (*)    All other components within normal limits  TROPONIN I (HIGH SENSITIVITY) - Abnormal; Notable for the  following components:   Troponin I (High Sensitivity) 37 (*)    All other components within normal limits  D-DIMER, QUANTITATIVE  TROPONIN I (HIGH SENSITIVITY)    EKG EKG Interpretation  Date/Time:  Monday September 02 2021 04:09:10 EDT Ventricular Rate:  103 PR Interval:  161 QRS Duration: 95 QT Interval:  335 QTC Calculation: 439 R Axis:   22 Text Interpretation: Sinus tachycardia Inferior infarct, old Lateral leads are also involved When compared with ECG of 07/15/2021, No significant change was found Confirmed by Delora Fuel ([id number]) on 09/02/2021 4:28:05 AM  Radiology No results found.  Procedures Procedures  Cardiac monitor shows normal sinus rhythm, per my interpretation.  Medications Ordered in ED Medications - No data to display  ED Course/ Medical Decision Making/ A&P                           Medical Decision Making Amount and/or Complexity of Data Reviewed Labs: ordered. Radiology: ordered.  Risk OTC drugs.   Episode of tachycardia which has resolved, but heart rate is still in the upper end of normal.  I have reviewed his old records, and in all recent encounters, heart rate has been in the 60s.  I have reviewed and interpreted his ECG and my interpretation is borderline sinus tachycardia but otherwise unchanged from prior.  Symptoms are vague, which makes differential diagnosis is  broad.  Need to consider possible angina equivalent, possible pneumonia, possible pulmonary embolism.  I have ordered chest x-ray to rule out pneumonia, laboratory evaluation of CBC, basic metabolic panel, troponin x2, D-dimer.  I have reviewed and interpreted the laboratory test, and my interpretation is normal CBC, normal basic metabolic panel, normal D-dimer, mildly elevated troponin of uncertain significance with repeat troponin pending.  He I have ordered a dose of aspirin and case is signed out to Dr. Sabra Heck.  Final Clinical Impression(s) / ED Diagnoses Final diagnoses:  Tachycardia  Elevated troponin    Rx / DC Orders ED Discharge Orders     None         Delora Fuel, MD 62/[birth year]/55 [phone]

## 2021-09-02 NOTE — Telephone Encounter (Signed)
-----   Message from Roslyn, PA-C sent at 09/02/2021 11:11 AM EDT ----- Regarding: hospital follow-up and [facility] Pt needs liVe heart monitor for syncope and hospital follow-up in 4-6 weeks.

## 2021-09-02 NOTE — ED Provider Notes (Addendum)
This patient does have an elevated troponin which is gone from the 30s to the 60s, blood pressure remained stable at 106/64, the patient is not having any active pain at this time.  I discussed the case with Dr. Al Pimple at [facility] and the cardiology service, she will contact a local cardiologist to have them see the patient while in the emergency department as there is a delay in transferring patients due to lack of beds at the excepting cardiology center.  She has requested that we place temporary holding admission orders for William [name] to a progressive telemetry bed which I have completed.  Patient will be started on heparin, the patient is critically ill with an elevated troponin which seems to be rising and known coronary disease based on heart cath from July.  .Critical Care  Performed by: Noemi Chapel, MD Authorized by: Noemi Chapel, MD   Critical care provider statement:    Critical care time (minutes):  30   Critical care time was exclusive of:  Separately billable procedures and treating other patients and teaching time   Critical care was necessary to treat or prevent imminent or life-threatening deterioration of the following conditions:  Cardiac failure   Critical care was time spent personally by me on the following activities:  Development of treatment plan with patient or surrogate, discussions with consultants, evaluation of patient's response to treatment, examination of patient, ordering and review of laboratory studies, ordering and review of radiographic studies, ordering and performing treatments and interventions, pulse oximetry, re-evaluation of patient's condition, review of old charts and obtaining history from patient or surrogate   I assumed direction of critical care for this patient from another provider in my specialty: yes     Care discussed with: admitting provider and accepting provider at another facility   Comments:        This patient was further  evaluated by William [name] with cardiology.  He recommended a third troponin, the troponin came back at 99.  William [name] recommends after evaluation of these labs that the patient can be safely discharged home as he is not having any chest pain or shortness of breath.  This was communicated to the patient.  He expressed his understanding.  The patient will be discharged home at the recommendation of cardiology to follow-up in the outpatient setting.  Final diagnoses:  Tachycardia  Elevated troponin       Noemi Chapel, MD 09/02/21 [id number]    Noemi Chapel, MD 09/02/21 [phone]

## 2021-09-02 NOTE — Consult Note (Addendum)
Cardiology Consultation   Patient ID: William [name] MRN: [id number]; DOB: Jun 15, 1945  Admit date: 09/02/2021 Date of Consult: 09/02/2021  PCP:  [name] [place] [facility] Cardiologist:  William Rouge, MD  Cardiology APP:  William Burn, PA-C  {   Patient Profile:   William [name] is a 76 y.o. male with a hx of CAD with prior stenting, anxiety, prostate cancer, diabetes, Afib, HTN, HLD and severe AS who is being seen 09/02/2021 for the evaluation of tachycardia and NSTEMI at the request of Dr. Roxanne [name].  History of Present Illness:   William [name] is followed by Dr. Johnsie [name]. H/o CAD with DES dRCA in 2015. Cardiac cath 07/15/21 with a new finding of total occlusion of the mRCA with filling of the distal vessel from L>R collaterals. Severe mLAD stenosis treated with DES.   H/o prostate cancer treated with radiation.   Echo 06/19/21 showed LVEF 60-65%, moderate LVH, normal RV function, mild to mod MR, moderate MAD, severe AS with mean gradient 41.46mHg, peak gradient 66.440mg, AVA 0.54cm2, SI 0.27, SVI 28. He was seen by the structural heart team 07/17/21 and plan was to proceed with TAVR.   The patient presented to the ER 09/02/21 for elevated heart rate.  The patient reports he woke up around 3 AM because he felt hot.  He says this is happened in the past when his blood glucose was low.  He woke up drink water, and eat a cookie.  He noted his heart rate was 117 up to 130. This lasted for least 30 minutes.  Overall he was asymptomatic.  He denied any chest pain, shortness of breath, dizziness, lightheadedness, headache, palpitations.  When he felt his heart rate was not going down he called his family member, who brought him to the ER.  In the ER pulse was 100 bpm, blood pressure 134/70, afebrile, respiratory rate 20, 96% O2.  CBC showed WBC 7.5, hemoglobin 13.2, platelets 198.  D-dimer negative.  BMP showed sodium 143, potassium 3.5, blood glucose 121, CO2 24, creatinine  1.2, BUN 26.  High-sensitivity troponin was 37 and then 62.  Patient was started on IV heparin and admitted for further work-up.  [facility]  Past Medical History:  Diagnosis Date   Anxiety    Aortic stenosis    Arthritis    CAD (coronary artery disease)    a. s/p DES to distal RCA in 08/2013, DES to LAD 07/2021   [facility]   [facility]   CKD (chronic kidney disease)    Diabetes mellitus without complication (HCC)    Family history of colon cancer    GERD (gastroesophageal reflux disease)    Hypercalcemia    Hypercholesteremia    Hypertension    Kidney stone    [place]   Personal history of colonic polyps     Past Surgical History:  Procedure Laterality Date   APPENDECTOMY     BIOPSY  11/03/2019   Benign gastric mucosa with reactive changes and focal inflammation   BIOPSY  03/11/2021   Procedure: BIOPSY;  Surgeon: CaEloise HarmanDO;  [facility];  Service: Endoscopy;;   COLONOSCOPY WITH PROPOFOL N/A 02/15/2018   12 polyps ranging in 5 to 20 mm in size were tubular adenoma and recommended repeat exam in 2023   COLONOSCOPY WITH PROPOFOL N/A 03/11/2021   Procedure: COLONOSCOPY WITH PROPOFOL;  Surgeon: CaEloise Harman  DO;  [facility];  Service: Endoscopy;  Laterality: N/A;  9:15am   CORONARY STENT INTERVENTION N/A 07/15/2021   Procedure: CORONARY STENT INTERVENTION;  Surgeon: [name], MD;  Location: [place] CV LAB;  Service: Cardiovascular;  Laterality: N/A;   CORONARY STENT PLACEMENT  08/10/2013   ESOPHAGOGASTRODUODENOSCOPY (EGD) WITH PROPOFOL N/A 11/03/2019   normal esophagus, small hiatal hernia, diffuse erythematous mucosa in the entire stomach with scattered erosions.  Status post gastric biopsies for histology.  Surgical pathology found the biopsies to be benign gastric mucosa with reactive changes and focal inflammation, negative for H. Pylori.   FLEXIBLE SIGMOIDOSCOPY  N/A 11/03/2019   attempted colonoscopy but inadequate prep   gsw to abd     INTRAVASCULAR LITHOTRIPSY  07/15/2021   Procedure: INTRAVASCULAR LITHOTRIPSY;  Surgeon: [name], MD;  Location: [place] CV LAB;  Service: Cardiovascular;;   LEFT HEART CATHETERIZATION WITH CORONARY ANGIOGRAM N/A 08/10/2013   Procedure: LEFT HEART CATHETERIZATION WITH CORONARY ANGIOGRAM;  Surgeon: [name], MD;  Location: [place] CATH LAB;  Service: Cardiovascular;  Laterality: N/A;   MULTIPLE EXTRACTIONS WITH ALVEOLOPLASTY N/A 08/22/2021   Procedure: MULTIPLE EXTRACTION WITH ALVEOLOPLASTY;  Surgeon: [name], [name];  Location: [place];  Service: Dentistry;  Laterality: N/A;   POLYPECTOMY  02/15/2018   Procedure: POLYPECTOMY;  Surgeon: [name], MD;  [facility];  Service: Endoscopy;;  colon   POLYPECTOMY  03/11/2021   Procedure: POLYPECTOMY;  Surgeon: [name], DO;  [facility];  Service: Endoscopy;;   RIGHT/LEFT HEART CATH AND CORONARY ANGIOGRAPHY N/A 07/15/2021   Procedure: RIGHT/LEFT HEART CATH AND CORONARY ANGIOGRAPHY;  Surgeon: [name], MD;  Location: [place] CV LAB;  Service: Cardiovascular;  Laterality: N/A;     Home Medications:  Prior to Admission medications   Medication Sig Start Date End Date Taking? Authorizing Provider  acarbose (PRECOSE) 100 MG tablet Take 100 mg by mouth 3 (three) times daily with meals.    Yes [provider]  Acetylcysteine (NAC 600) 600 MG CAPS Take 600 mg by mouth daily.   Yes [provider]  alendronate (FOSAMAX) 70 MG tablet Take 1 tablet (70 mg total) by mouth every 7 (seven) days. Take with a full glass of water on an empty stomach. 04/15/21  Yes Cassandria Anger, MD  alfuzosin (UROXATRAL) 10 MG 24 hr tablet Take 1 tablet (10 mg total) by mouth daily with breakfast. 04/29/21  Yes McKenzie, Candee Furbish, MD  amLODipine (NORVASC) 5 MG tablet TAKE 1 TABLET(5 MG) BY MOUTH DAILY  11/30/17  Yes Herminio Commons, MD  Ascorbic Acid (VITAMIN C) 1000 MG tablet Take 1,000 mg by mouth every morning.   Yes [provider]  aspirin EC 81 MG tablet Take 81 mg by mouth daily.    Yes [provider]  Cholecalciferol (VITAMIN D3) 125 MCG (5000 UT) TABS Take 5,000 Units by mouth daily.   Yes [provider]  clopidogrel (PLAVIX) 75 MG tablet Take 1 tablet (75 mg total) by mouth daily. 08/07/21 08/07/22 Yes [name], MD  Coenzyme Q10 (COQ10) 100 MG CAPS Take 100 mg by mouth daily.   Yes [provider]  FARXIGA 10 MG TABS tablet Take 10 mg by mouth daily.  12/01/17  Yes [provider]  ferrous sulfate 325 (65 FE) MG EC tablet Take 1 tablet (325 mg total) by mouth daily with breakfast. 08/13/21 08/13/22 Yes Erenest Rasher, PA-C  furosemide (LASIX) 20 MG tablet Take 20 mg by mouth 2 (two) times daily.  10/11/14  Yes [provider]  glipiZIDE (GLUCOTROL) 10 MG tablet Take 10 mg by mouth 2 (two) times daily. 08/02/13  Yes [provider]  HYDROcodone-acetaminophen (NORCO) 10-325 MG tablet Take 1 tablet by mouth every 6 (six) hours as needed for moderate pain.   Yes [provider]  LEVEMIR FLEXTOUCH 100 UNIT/ML Pen Inject 90 Units into the skin at bedtime.  07/08/16  Yes [provider]  lisinopril (ZESTRIL) 20 MG tablet Take 1 tablet (20 mg total) by mouth daily. 03/28/19  Yes Herminio Commons, MD  Magnesium 400 MG TABS Take 400 mg by mouth daily.   Yes [provider]  Menatetrenone (VITAMIN K2) 100 MCG TABS Take 100 mcg by mouth daily.   Yes [provider]  metFORMIN (GLUCOPHAGE) 1000 MG tablet Take 1,000 mg by mouth 2 (two) times daily. 09/14/14  Yes [provider]  metFORMIN (GLUCOPHAGE-XR) 500 MG 24 hr tablet Take 500 mg by mouth every evening. Take with 1000 Metformin   Yes [provider]  metoprolol tartrate (LOPRESSOR) 25 MG tablet Take 1 tablet (25 mg  total) by mouth daily. 07/26/19  Yes Strader, [place] M, PA-C  pantoprazole (PROTONIX) 40 MG tablet Take 40 mg by mouth every evening.  01/04/18  Yes [provider]  potassium citrate (UROCIT-K) 5 MEQ (540 MG) SR tablet Take 5 mEq by mouth 3 (three) times daily with meals. 03/28/21  Yes [provider]  Quercetin 500 MG CAPS Take 1,000 mg by mouth daily.   Yes [provider]  rosuvastatin (CRESTOR) 20 MG tablet Take 20 mg by mouth every evening.    Yes [provider]  tamsulosin (FLOMAX) 0.4 MG CAPS capsule Take 0.4 mg by mouth daily after supper. 08/02/13  Yes [provider]  trolamine salicylate (BLUE-EMU HEMP) 10 % cream Apply 1 application topically as needed for muscle pain.   Yes [provider]  vitamin B-12 (CYANOCOBALAMIN) 1000 MCG tablet Take 1,000 mcg by mouth daily.   Yes [provider]  zinc gluconate 50 MG tablet Take 50 mg by mouth daily.   Yes [provider]  ACCU-CHEK GUIDE test strip  05/21/21   [provider]  Accu-Chek Softclix Lancets lancets 1 each 3 (three) times daily. 05/20/21   [provider]  nitroGLYCERIN (NITROSTAT) 0.4 MG SL tablet Place 1 tablet (0.4 mg total) under the tongue every 5 (five) minutes x 3 doses as needed for chest pain (if no relief after 2nd dose, proceed to the ED for an evalution or call 911). 07/02/20   Verta Ellen., NP    Inpatient Medications: Scheduled Meds:  Continuous Infusions:  PRN Meds:   Allergies:   No Known Allergies  Social History:   Social History   Socioeconomic History   Marital status: Widowed    Spouse name: Not on file   Number of children: 5   Years of education: Not on file   Highest education level: Not on file  Occupational History   Occupation: Retired Archivist  Tobacco Use   Smoking status: Former    Packs/day: 3.00    Years: 45.00    Total pack years: 135.00    Types: Cigarettes    Start  date: 01/06/1962    Quit date: 10/24/2004    Years since quitting: 16.8   Smokeless tobacco: Former    Quit date: 08/10/2005  Vaping Use   Vaping Use: Never used  Substance and Sexual Activity   Alcohol use: Not Currently    Comment: occasional   Drug use: No   Sexual activity: Not on file  Other Topics Concern   Not on file  Social History Narrative   Not on file   Social Determinants of Health   Financial Resource Strain: Not on file  Food Insecurity: Not on file  Transportation Needs: Not on file  Physical Activity: Not on file  Stress: Not on file  Social Connections: Not on file  Intimate Partner Violence: Not on file    Family History:    Family History  Problem Relation Age of Onset   Diabetes Mother    Heart attack Mother 10   Pulmonary embolism Father    Colon cancer Brother 24       Passed age 81 from colon ca   Gastric cancer Neg Hx    Esophageal cancer Neg Hx      ROS:  Please see the history of present illness.   All other ROS reviewed and negative.     Physical Exam/Data:   Vitals:   09/02/21 0407 09/02/21 0409 09/02/21 0500  BP:  134/70 106/64  Pulse:  100 84  Resp:  20 19  Temp:  98.5 F (36.9 C)   TempSrc:  Oral   SpO2:  96% 95%  Weight: 104.3 kg    Height: '5\' 9"'$  (1.753 m)     No intake or output data in the 24 hours ending 09/02/21 0800    09/02/2021    4:07 AM 08/22/2021    9:29 AM 08/15/2021    8:13 AM  Last 3 Weights  Weight (lbs) 230 lb 230 lb 234 lb 3.2 oz  Weight (kg) 104.327 kg 104.327 kg 106.232 kg     Body mass index is 33.97 kg/m.  General:  Well nourished, well developed, in no acute distress HEENT: normal Neck: no JVD Vascular: No carotid bruits; Distal pulses 2+ bilaterally Cardiac:  normal S1, S2; RRR; + murmur  Lungs:  clear to auscultation bilaterally, no wheezing, rhonchi or rales  Abd: soft, nontender, no hepatomegaly  Ext: no edema Musculoskeletal:  No deformities, BUE and BLE strength normal and equal Skin:  warm and dry  Neuro:  CNs 2-12 intact, no focal abnormalities noted Psych:  Normal affect   Telemetry:  Telemetry was personally reviewed and demonstrates:  NSR Hr 60s, PVCs  Relevant CV Studies: Cardiac Cath 07/15/21   Prox RCA lesion is 100% stenosed.   Prox Cx to Mid Cx lesion is 40% stenosed.   Dist Cx lesion is 50% stenosed.   2nd Mrg lesion is 40% stenosed.   Mid LAD-1 lesion is 95% stenosed.   Mid LAD-2 lesion is 50% stenosed.   1st Diag lesion is 50% stenosed.   Previously placed Dist RCA stent of unknown type is  widely patent.   A drug-eluting stent was successfully placed using a SYNERGY XD [id number].   Post intervention, there is a 0% residual stenosis.   Severe mid LAD stenosis with heavy calcification. Moderate mid LAD stenosis. Moderate diagonal stenosis Successful PTCA/DES x 1 mid LAD with intracoronary lithotripsy prior to stent placement Moderate mid Circumflex stenosis. Moderate stenosis OM3 Large dominant RCA with total occlusion (CTO) of the mid RCA. The distal RCA fills from left to right collaterals.  RA 6, RV 47/13/14, PA 35/15 mean 29, PCWP 9, AO 116/52   Recommendations: Will continue ASA  and Plavix for at least six months. Will continue workup for TAVR.   Echo 06/19/21  1. Left ventricular ejection fraction, by estimation, is 60 to 65%. The  left ventricle has normal function. The left ventricle has no regional  wall motion abnormalities. There is moderate concentric left ventricular  hypertrophy. Left ventricular  diastolic parameters are consistent with Grade II diastolic dysfunction  (pseudonormalization). Elevated left ventricular end-diastolic pressure.   2. Right ventricular systolic function is normal. The right ventricular  size is normal. Tricuspid regurgitation signal is inadequate for assessing  PA pressure.   3. Left atrial size was mildly dilated.   4. The mitral valve is degenerative. Mild to moderate mitral valve  regurgitation. No evidence  of mitral stenosis. Moderate mitral annular  calcification.   5. The aortic valve is calcified. There is severe calcifcation of the  aortic valve. There is severe thickening of the aortic valve. Aortic valve  regurgitation is not visualized. Severe aortic valve stenosis. Aortic  valve area, by VTI measures 0.61 cm.  Aortic valve mean gradient measures 41.5 mmHg. Aortic valve Vmax measures  4.08 m/s.   6. The inferior vena cava is dilated in size with >50% respiratory  variability, suggesting right atrial pressure of 8 mmHg.   7. Compared to study dated 06/24/2018, the P/M transaortic gradients have  increased from 40/22 to 66/43mHg, DVI has decreased from 0.41 to 0.27,  VMax has increased from 3.130m to 4.0825mand AVA has decreased from  1.18cm2 (VTI) to 0.61cm2. Aortic  stenosis is now severe.   Laboratory Data:  High Sensitivity Troponin:   Recent Labs  Lab 09/02/21 0438 09/02/21 0632  TROPONINIHS 37* 62*     Chemistry Recent Labs  Lab 09/02/21 0438  NA 143  K 3.5  CL 111  CO2 24  GLUCOSE 121*  BUN 26*  CREATININE 1.20  CALCIUM 9.5  GFRNONAA >60  ANIONGAP 8    No results for input(s): "PROT", "ALBUMIN", "AST", "ALT", "ALKPHOS", "BILITOT" in the last 168 hours. Lipids No results for input(s): "CHOL", "TRIG", "HDL", "LABVLDL", "LDLCALC", "CHOLHDL" in the last 168 hours.  Hematology Recent Labs  Lab 09/02/21 0438  WBC 7.5  RBC 4.46  HGB 13.2  HCT 41.1  MCV 92.2  MCH 29.6  MCHC 32.1  RDW 16.0*  PLT 198   Thyroid No results for input(s): "TSH", "FREET4" in the last 168 hours.  BNPNo results for input(s): "BNP", "PROBNP" in the last 168 hours.  DDimer  Recent Labs  Lab 09/02/21 0438  DDIMER <0.27     Radiology/Studies[facility]  [facility]  Result Date: 09/02/2021 CLINICAL DATA:  Chest discomfort. EXAM: PORTABLE CHEST 1 VIEW COMPARISON:  CT 07/26/2021 FINDINGS: Normal cardiomediastinal contours. Decreased lung volumes. No pleural effusion or edema.  No airspace opacities. The visualized osseous structures appear intact. IMPRESSION: 1. Low lung volumes. 2. No acute findings. Electronically Signed   By: TayKerby MoorsD.   On: 09/02/2021 07:19     Assessment and Plan:   Tachycardia -Patient presented with tachycardia with reported heart rates up to 110-130s that lasted for at least 30 minutes.  Patient was asymptomatic.  No anginal symptoms reported. -In the ER EKG showed sinus tachycardia with a heart rate of 103 bpm.  Baseline heart rate 60-70 Telemetry so far shows normal sinus rhythm, PVCs, with a heart rate 60-70. -D-dimer negative - minimally elevated troponin and he was started on IV heparin.  Otherwise labs unremarkable. -Patient says he has  felt this way when his blood glucose was low. -Continue monitoring telemetry -Check TSH -Consider heart monitor discharge  Elevated troponin Non-STEMI CADs/p recent stenting DES mLAD and prior stenting - recent stenting of mLAD, report above -Patient denies any anginal symptoms -He reports compliance with DAPT -High-sensitivity troponin elevated 37>62.  Continue to trend -IV heparin as above -Given lack of anginal symptoms, do not suspect he will need invasive work-up.  Possibly demand ischemia in the setting of elevated heart rate, known CAD, and severe AS MD to see. We will re-check a third Troponin. Can likely stop IV heparin if trend is flat.  Severe AS -Patient is currently undergoing TAVR work-up  For questions or updates, please contact [place] Please consult [email or web address] for contact info under    Signed, Cadence Ninfa Meeker, PA-C  09/02/2021 8:00 AM   Personally seen and examined. Agree with above.  76 year old male with severe aortic stenosis, occluded right coronary artery with collaterals, recent LAD stent, here with prior mild tachycardia heart rate in the 110s to 120s unusual for him noted in the middle of the night when he felt as though his blood sugar  was low, checked out at 79.  He felt hot.  Awoke from sleep.  He placed his blood pressure cuff on and this is when he noticed his heart rate was mildly elevated.  This was unusual for him.  He called his daughter who took him to the hospital to be safe.  At no point did he have symptoms of chest pain, shortness of breath, fevers chills nausea vomiting syncope bleeding.  His initial troponin was in the 30s, second was in the 60s, third was in the 90s.  All of these are low-level.  EKG personally reviewed shows no ischemic changes.  He is currently sinus rhythm.  He is undergoing work-up for TAVR.  On exam, 3/6 systolic murmur right upper sternal border.  Neurologically sound, nonfocal.  Lungs are clear.  Pleasant.  Family at bedside.  Assessment and plan:  Elevated troponin - Mildly elevated troponin (gray zone) likely secondary to underlying severe aortic stenosis as well as underlying coronary artery disease.  He is not having any anginal symptoms.  No ischemic changes noted on ECG.  I do not believe that this is acute coronary syndrome.  A mildly elevated troponin does increase ones risk however.  Clearly he is at increased risk with his underlying CAD as well as severe aortic stenosis. -I feel comfortable letting him go home.  Continue with current home medications.  Severe aortic stenosis - Has upcoming TAVR evaluation with surgeon.  Moving forward in the process.  Murmur heard on exam.  Echocardiogram reviewed.  Gradient mean 42 mmHg.  CAD/diabetes -Appears stable. -Recent LAD stent placed last month.  There is no evidence of stent thrombosis.  OK with DC. Has close follow up.   Candee Furbish, MD

## 2021-09-02 NOTE — Progress Notes (Signed)
ANTICOAGULATION CONSULT NOTE - Initial Consult  Pharmacy Consult for Heparin Indication: ACS/STEMI  No Known Allergies  Patient Measurements: Height: '5\' 9"'$  (175.3 cm) Weight: 104.3 kg (230 lb) IBW/kg (Calculated) : 70.7 HEPARIN DW (KG): 93.2   Vital Signs: Temp: 98.5 F (36.9 C) (08/28 0409) Temp Source: Oral (08/28 0409) BP: 106/64 (08/28 0500) Pulse Rate: 84 (08/28 0500)  Labs: Recent Labs    09/02/21 0438 09/02/21 0632  HGB 13.2  --   HCT 41.1  --   PLT 198  --   CREATININE 1.20  --   TROPONINIHS 37* 62*    Estimated Creatinine Clearance: 63.3 mL/min (by C-G formula based on SCr of 1.2 mg/dL).   Medical History: Past Medical History:  Diagnosis Date   Anxiety    Aortic stenosis    Arthritis    CAD (coronary artery disease)    a. s/p DES to distal RCA in 08/2013, DES to LAD 07/2021   Cancer [facility])    prostate   CKD (chronic kidney disease)    Diabetes mellitus without complication (HCC)    Family history of colon cancer    GERD (gastroesophageal reflux disease)    Hypercalcemia    Hypercholesteremia    Hypertension    Kidney stone    PAF (paroxysmal atrial fibrillation) (HCC)    Personal history of colonic polyps     Medications:  See med rec  Assessment: Patient with history of hypertension, diabetes, hyperlipidemia, coronary artery disease, diastolic heart failure, chronic kidney disease, hyperparathyroidism and comes in because of a rapid heart rate.  He woke up and felt hot so he checked his blood pressure and noted his heart rate was elevated at 118.  He denies chest pain, heaviness, tightness, pressure.  He denies dyspnea, nausea, vomiting, diaphoresis.have an elevated troponin which is gone from the 30s to the 60s, blood pressure remained stable at 106/64, the patient is not having any active pain at this time. With elevated troponin and know coronary dx, MD wanted to start heparin per pharmacy. Not on oral anticoagulants PTA.  Goal of Therapy:   Heparin level 0.3-0.7 units/ml Monitor platelets by anticoagulation protocol: Yes   Plan:  Give 4000 units bolus x 1 Start heparin infusion at 1150 units/hr Check anti-Xa level in ~8 hours and daily while on heparin Continue to monitor H&H and platelets  Isac Sarna, BS Pharm D, BCPS Clinical Pharmacist 09/02/2021,7:49 AM

## 2021-09-02 NOTE — Telephone Encounter (Signed)
Called and notified pt that [facility] would give him a call to verify address, phone number, etc before shipping. Pt has appt with Gerrianne Scale, PA-C on 09/18/2021 @ 11:45 am.

## 2021-09-02 NOTE — Discharge Instructions (Signed)
You have been seen by Dr. Marlou Porch with cardiology, he recommends that you can go home, he thinks that the reason that your blood test was slightly elevated was because your heart rate was fast, this is good news, he does want you to be followed up in the office very closely.  Please see the phone number above and make a phone call in the morning to arrange follow-up.  If you do develop chest pain or shortness of breath I want you to return to the emergency department immediately

## 2021-09-02 NOTE — ED Triage Notes (Signed)
Pt here with c/o tachycardia. States HR @ home was 118. Denies any other symptoms.

## 2021-09-02 NOTE — Telephone Encounter (Signed)
Monitor ordered- sent to pt's home.

## 2021-09-03 ENCOUNTER — Other Ambulatory Visit: Payer: Self-pay

## 2021-09-03 DIAGNOSIS — R55 Syncope and collapse: Secondary | ICD-10-CM

## 2021-09-03 NOTE — Progress Notes (Signed)
Error

## 2021-09-05 DIAGNOSIS — R55 Syncope and collapse: Secondary | ICD-10-CM | POA: Diagnosis not present

## 2021-09-06 DIAGNOSIS — R55 Syncope and collapse: Secondary | ICD-10-CM | POA: Diagnosis not present

## 2021-09-10 ENCOUNTER — Other Ambulatory Visit: Payer: Self-pay

## 2021-09-10 DIAGNOSIS — I350 Nonrheumatic aortic (valve) stenosis: Secondary | ICD-10-CM

## 2021-09-11 ENCOUNTER — Institutional Professional Consult (permissible substitution): Payer: Medicare HMO | Admitting: Surgery

## 2021-09-11 VITALS — BP 106/66 | HR 72 | Resp 20 | Ht 69.0 in | Wt 230.0 lb

## 2021-09-11 DIAGNOSIS — I350 Nonrheumatic aortic (valve) stenosis: Secondary | ICD-10-CM

## 2021-09-11 NOTE — Progress Notes (Signed)
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 post-dilated with a 3.75 x 10 mm [place] balloon.   All catheter exchanges were performed over an exchange length guidewire.   The sheath was removed from the right radial artery and a hemostasis band was applied at the arteriotomy site on the right wrist.      Estimated blood loss <50 mL.   During this procedure medications were administered to achieve and maintain moderate conscious  sedation while the patient's heart rate, blood pressure, and oxygen saturation were continuously monitored and I was present face-to-face 100% of this time.   Medications (Filter: Administrations occurring from [id number] to 1031 on 07/15/21)  important  Continuous medications are totaled by the amount administered until 07/15/21 1031.   fentaNYL (SUBLIMAZE) injection (mcg) Total dose:  50 mcg  Date/Time Rate/Dose/Volume Action   07/15/21 0859 25 mcg Given   0912 25 mcg Given    midazolam (VERSED) injection (mg) Total dose:  2 mg  Date/Time Rate/Dose/Volume Action   07/15/21 0859 1 mg Given   0912 1 mg Given    lidocaine (PF) (XYLOCAINE) 1 % injection (mL) Total volume:  16 mL  Date/Time Rate/Dose/Volume Action   07/15/21 0902 2 mL Given   0905 2 mL Given   0914 12 mL Given    Heparin (Porcine) in NaCl 1000-0.9 UT/500ML-% SOLN (mL) Total volume:  1,000 mL  Date/Time Rate/Dose/Volume Action   07/15/21 0903 500 mL Given   0903 500 mL Given    Radial Cocktail/Verapamil only (mL) Total volume:  10 mL  Date/Time Rate/Dose/Volume Action   07/15/21 0908 10 mL Given    heparin sodium (porcine) injection (Units) Total dose:  14,000 Units  Date/Time Rate/Dose/Volume Action   07/15/21 0932 5,000 Units Given   0936 6,000 Units Given   0949 3,000 Units Given    clopidogrel (PLAVIX) tablet (mg) Total dose:  600 mg  Date/Time Rate/Dose/Volume Action   07/15/21 0939 600 mg Given    famotidine (PEPCID) IVPB 20 mg premix (mg) Total dose:  20 mg  Date/Time Rate/Dose/Volume Action   07/15/21 0940 20 mg New Bag/Given   1001  (over 30 min) Stopped    iohexol (OMNIPAQUE) 350 MG/ML injection (mL) Total volume:  130 mL  Date/Time Rate/Dose/Volume Action   07/15/21 1016 130 mL Given    Sedation Time  Sedation Time Physician-1: 1 hour 13 minutes 27 seconds Contrast  Medication Name Total Dose  iohexol (OMNIPAQUE) 350 MG/ML injection 130 mL   Radiation/Fluoro  Fluoro time:  14.8 (min) DAP: 55282 (mGycm2) Cumulative Air Kerma: 9983 (mGy) Complications  Complications documented before study signed (07/15/2021 38:25 AM)   No complications were associated with this study.  Documented by Renne Musca, RT - 07/15/2021 10:21 AM     Coronary Findings  Diagnostic Dominance: Right Left Anterior Descending  Vessel is large.  Mid LAD-1 lesion is 95% stenosed. The lesion is calcified.  Mid LAD-2 lesion is  50% stenosed.    First Diagonal Branch  1st Diag lesion is 50% stenosed.    Left Circumflex  Vessel is large.  Prox Cx to Mid Cx lesion is 40% stenosed. The lesion is calcified.  Dist Cx lesion is 50% stenosed.    Second Obtuse Marginal Branch  2nd Mrg lesion is 40% stenosed.    Right Coronary Artery  Vessel is large.  Prox RCA lesion is 100% stenosed. The lesion is chronically occluded.  Previously placed Dist RCA stent of unknown type is widely patent.    Third Right Posterolateral Branch  Collaterals  3rd RPL filled by collaterals from 2nd Sept.      Intervention   Mid LAD-1 lesion  Stent  [place] GUIDE 6FR XBLAD3.5 guide catheter was inserted. Lesion crossed with guidewire using a WIRE COUGAR XT STRL 190CM. Pre-stent angioplasty was performed using a BALLN SAPPHIRE 2.0X12. A drug-eluting stent was successfully placed using a SYNERGY XD 3.50X16. Stent strut is well apposed. Post-stent angioplasty was performed using a BALL SAPPHIRE NC24 3.75X10.  Post-Intervention Lesion Assessment  The intervention was successful. Pre-interventional TIMI flow is 3. Post-intervention TIMI flow is 3. No complications occurred at this lesion.  There is a 0% residual stenosis post intervention.     Coronary Diagrams  Diagnostic Dominance: Right  Intervention   Implants     Permanent Stent  Synergy Xd 3.50x16 - [id number] - Implanted  Inventory item: SYNERGY XD 3.50X16 Model/Cat number: [id number]  Manufacturer: Wilkie Aye Lot number: [id number]   Device identifier: [id number] Device identifier type: GS1  GUDID Information  Request status Successful    Brand name: [place] Rote Version/Model: [id number]  Company name: [employer] safety info as of 07/15/21: MR Conditional  Contains dry or latex rubber: No    GMDN P.T. name: Drug-eluting coronary artery stent, non-bioabsorbable-polymer-coated     As of 07/15/2021  Status: Implanted       Syngo Images   Show images for CARDIAC CATHETERIZATION Images on Long Term Storage   Show images for [place], Meister to Procedure Log  Procedure Log    Hemo Data  Flowsheet Row Most Recent Value  Fick Cardiac Output 4.37 L/min  Fick Cardiac Output Index 2 (L/min)/BSA  RA A Wave 12 mmHg  RA V Wave 8 mmHg  RA Mean 6 mmHg  RV Systolic Pressure 47 mmHg  RV Diastolic Pressure 13 mmHg  RV EDP 14 mmHg  PA Systolic Pressure 35 mmHg  PA Diastolic Pressure 15 mmHg  PA Mean 29 mmHg  PW A Wave 16 mmHg  PW V Wave 16 mmHg  PW Mean 9 mmHg  AO Systolic Pressure 433 mmHg  AO Diastolic Pressure 52 mmHg  AO Mean 77 mmHg  QP/QS 1  TPVR Index 14.48 HRUI  TSVR Index 38.45 HRUI  PVR SVR Ratio 0.28  TPVR/TSVR Ratio 0.38    ADDENDUM REPORT: 07/26/2021 15:16   EXAM: OVER-READ INTERPRETATION  CT CHEST   The following report is an over-read performed by radiologist Dr. Maudry Diego [facility] on 07/26/2021. This over-read does not include interpretation of cardiac or coronary anatomy or pathology. The Cardiac TAVR CT interpretation by the cardiologist is attached.   COMPARISON:  None.   FINDINGS: Extracardiac findings will be described separately under dictation for contemporaneously obtained CTA chest, abdomen and pelvis.   IMPRESSION: Please see separate dictation for contemporaneously obtained CTA chest, abdomen and pelvis dated 07/26/2021 for full description of relevant extracardiac findings.  Electronically Signed   By:  Yetta Glassman M.D.   On: 07/26/2021 15:16    Addended by [id number], Dorris Singh, MD on 07/26/2021  3:18 PM   Study Result  Narrative & Impression  CLINICAL DATA:  Aortic Stenosis   EXAM: Cardiac TAVR CT   TECHNIQUE: The patient was scanned on a Siemens Force 676 slice scanner. A 120 kV retrospective scan was triggered in the ascending thoracic aorta at 140 HU's. Gantry rotation speed was 250 msecs and collimation was .6 mm. No beta blockade or nitro were given. The 3D data set was reconstructed in 5% intervals of the R-R cycle. Systolic and diastolic phases were analyzed on a dedicated work station using MPR, MIP and VRT modes. The patient received 80 cc of contrast.   FINDINGS: Aortic Valve: Calcified tri leaflet AV score 2845   Aorta: No aneurysm normal variant bovine arch moderate calcific atherosclerosis   Sino-tubular Junction: 27 mm   Ascending Thoracic Aorta: 32 mm   Aortic Arch: 26 mm   Descending Thoracic Aorta: 25 mm   Sinus of Valsalva Measurements:   Non-coronary: 31 mm   Right - coronary: 30 mm Height 17.8 mm   Left -   coronary: 33.3 mm  Height 16.7 mm   Coronary Artery Height above Annulus:   Left Main: 14 mm above annulus   Right Coronary: 16.4 mm above annulus   Virtual Basal Annulus Measurements:   Maximum / Minimum Diameter: 25.6 mm x 20.7 mm   Perimeter: 73.3 mm   Area: 411.5 mm 2   Coronary Arteries: Sufficient height above annulus for deployment   Optimum Fluoroscopic Angle for Delivery: LAO 12 Caudal 16 degrees   Membranous septal length 8.7 mm   IMPRESSION: 1. Calcified tri leaflet AV with score 2845   2. Annular area of 411.5 mm2 suitable for a 23 mm Sapien 3 valve May be more favorable to place a 29 mm Medtronic Evolut However access to patients coronaries may be an issue as he has Significant 2 vessel CAD with recent stenting of the mid LAD on 07/15/21   3.  Membranous septal length 8.7 mm   4. Optimum angiographic  angle for deployment LAO 12 Caudal 16 degrees   5.  Coronary arteries sufficient height above annulus for deployment   [name]   Electronically Signed: By: [name] M.D. On: 07/26/2021 11:57    Narrative & Impression  CLINICAL DATA:  Preop evaluation for TAVR   EXAM: CT ANGIOGRAPHY CHEST, ABDOMEN AND PELVIS   TECHNIQUE: Non-contrast CT of the chest was initially obtained.   Multidetector CT imaging through the chest, abdomen and pelvis was performed using the standard protocol during bolus administration of intravenous contrast. Multiplanar reconstructed images and MIPs were obtained and reviewed to evaluate the vascular anatomy.   RADIATION DOSE REDUCTION: This exam was performed according to the departmental dose-optimization program which includes automated exposure control, adjustment of the mA and/or kV according to patient size and/or use of iterative reconstruction technique.   CONTRAST:  134m OMNIPAQUE IOHEXOL 350 MG/ML SOLN   COMPARISON:  Lung cancer screening CT dated April 04, 2021   FINDINGS: CTA CHEST FINDINGS   Cardiovascular: Normal heart size. No pericardial effusion. Caliber thoracic aorta with moderate atherosclerotic disease. Severe aortic valve thickening and calcifications. Left main and three-vessel coronary artery calcifications. No suspicious filling defects of the pulmonary arteries.   Mediastinum/Nodes: Numerous bilateral thyroid nodules, largest is a right thyroid nodule measuring 1.1 cm on series  11, image 50.   Lungs/Pleura: Central airways are patent. No consolidation, pleural effusion or pneumothorax. Calcified granuloma of the right upper lobe. Solid pulmonary nodule of the left upper lobe measuring 5 mm, unchanged when compared with lung cancer screening CT dated April 04, 2021, recommend continued follow-up per prior recommendations.   Musculoskeletal: No chest wall abnormality. No acute or significant osseous  findings.   CTA ABDOMEN AND PELVIS FINDINGS   Hepatobiliary: No focal liver abnormality is seen. No gallstones, gallbladder wall thickening, or biliary dilatation.   Pancreas: Unremarkable. No pancreatic ductal dilatation or surrounding inflammatory changes.   Spleen: Normal in size without focal abnormality.   Adrenals/Urinary Tract: Adrenal glands are unremarkable. Kidneys are normal, without renal calculi, focal lesion, or hydronephrosis. Small stone of the left ureterovesicular junction measuring 4 mm.   Stomach/Bowel: Stomach is within normal limits. Appendix is not visualized, although there are no secondary findings of acute appendicitis. Diverticulosis. No evidence of bowel wall thickening, distention, or inflammatory changes.   Vascular/lymphatic: Moderate to severe atherosclerotic disease of the abdominal aorta consisting of calcified and noncalcified plaque. No significant stenosis of the branch vessels.   Reproductive: Prostatomegaly.   Other: Complex ventral abdominal wall hernia containing fat, fluid and a portion of the transverse colon. No abdominopelvic ascites.   Musculoskeletal: No acute or significant osseous findings.   VASCULAR MEASUREMENTS PERTINENT TO TAVR:   AORTA:   Minimal Aortic Diameter-12.7 mm   Severity of Aortic Calcification-severe   RIGHT PELVIS:   Right Common Iliac Artery -   Minimal Diameter-7.3 mm   Tortuosity-none   Calcification-severe   Right External Iliac Artery -   Minimal Diameter-8.0 mm   Tortuosity-mild   Calcification-none   Right Common Femoral Artery -   Minimal Diameter-7.9 mm   Tortuosity-moderate   Calcification-none   LEFT PELVIS:   Left Common Iliac Artery -   Minimal Diameter-7.7 mm   Tortuosity-mild   Calcification-mild   Left External Iliac Artery -   Minimal Diameter-7.8 mm   Tortuosity-moderate   Calcification-none   Left Common Femoral Artery -   Minimal Diameter-8.3 mm    Tortuosity-none   Calcification-none   Review of the MIP images confirms the above findings.   IMPRESSION: Vascular:   1. Vascular findings and measurements pertinent to potential TAVR procedure, as detailed above. 2. Thickening and calcification of the aortic valve, compatible with reported clinical history of aortic stenosis. 3. Severe aortoiliac atherosclerosis. Left main and 3 vessel coronary artery disease.   Nonvascular:   1. Small stone of the left ureterovesicular junction measuring 4 mm, likely nonobstructive given lack of hydronephrosis. Recommend urologic consultation. 2. Right thyroid nodule measuring 1.1 cm. Recommend thyroid ultrasound for further evaluation. 3. Complex ventral abdominal wall hernia containing fat, fluid and a portion of the transverse colon.   These results (left ureterovesicular junction stone) will be called to the ordering clinician or representative by the Radiologist Assistant, and communication documented in the PACS or [employer].     Electronically Signed   By: Yetta Glassman M.D.   On: 07/26/2021 15:32     Impression:  This 76 year old gentleman has stage D, severe, symptomatic aortic stenosis with NYHA class II symptoms of exertional fatigue and shortness of breath consistent with chronic diastolic congestive heart failure.  I have personally reviewed his 2D echocardiogram, cardiac catheterization, and CTA studies.  His echo shows a severely calcified and thickened aortic valve with restricted leaflet mobility.  The mean gradient across the aortic valve  is increased to 41.5 mmHg with a valve area of 0.61 cm consistent with severe aortic stenosis.  Left ventricular ejection fraction is normal with grade 2 diastolic dysfunction.  His catheterization showed a high-grade LAD stenosis that was treated with intracoronary lithotripsy and PCI/DES.  I agree that aortic valve replacement is indicated in this patient for relief of his  symptoms and to prevent left ventricular dysfunction.  Given his age and comorbid risk factors I think that transcatheter aortic valve replacement would be the best treatment for him.  His gated cardiac CTA shows anatomy suitable for TAVR using a SAPIEN 3 valve.  His abdominal and pelvic CTA shows adequate pelvic vascular anatomy to allow transfemoral insertion.  The patient was counseled at length regarding treatment alternatives for management of severe symptomatic aortic stenosis. The risks and benefits of surgical intervention has been discussed in detail. Long-term prognosis with medical therapy was discussed. Alternative approaches such as conventional surgical aortic valve replacement, transcatheter aortic valve replacement, and palliative medical therapy were compared and contrasted at length. This discussion was placed in the context of the patient's own specific clinical presentation and past medical history. All of his questions have been addressed.   Following the decision to proceed with transcatheter aortic valve replacement, a discussion was held regarding what types of management strategies would be attempted intraoperatively in the event of life-threatening complications, including whether or not the patient would be considered a candidate for the use of cardiopulmonary bypass and/or conversion to open sternotomy for attempted surgical intervention.  I think he would be a candidate for emergent sternotomy to manage any intraoperative complications.  The patient is aware of the fact that transient use of cardiopulmonary bypass may be necessary. The patient has been advised of a variety of complications that might develop including but not limited to risks of death, stroke, paravalvular leak, aortic dissection or other major vascular complications, aortic annulus rupture, device embolization, cardiac rupture or perforation, mitral regurgitation, acute myocardial infarction, arrhythmia, heart block  or bradycardia requiring permanent pacemaker placement, congestive heart failure, respiratory failure, renal failure, pneumonia, infection, other late complications related to structural valve deterioration or migration, or other complications that might ultimately cause a temporary or permanent loss of functional independence or other long term morbidity. The patient provides full informed consent for the procedure as described and all questions were answered.      Plan:  He will be scheduled for transfemoral TAVR using a SAPIEN 3 valve on 09/17/2021.  I spent 60 minutes performing this consultation and > 50% of this time was spent face to face counseling and coordinating the care of this patient's severe symptomatic aortic stenosis.   Gaye Pollack, MD 09/11/2021

## 2021-09-12 ENCOUNTER — Other Ambulatory Visit: Payer: Self-pay | Admitting: Physician Assistant

## 2021-09-12 DIAGNOSIS — Z952 Presence of prosthetic heart valve: Secondary | ICD-10-CM

## 2021-09-12 NOTE — Pre-Procedure Instructions (Signed)
Surgical Instructions    Your procedure is scheduled on September 17, 2021.  Report to [facility] Main Entrance "A" at 7:15 A.M., then check in with the Admitting office.  Call this number if you have problems the morning of surgery:  [phone]   If you have any questions prior to your surgery date call [phone]: Open Monday-Friday 8am-4pm    Remember:  Do not eat or drink after midnight the night before your surgery     Continue taking all other medications including Aspirin and Plavix (Clopidogrel)  without change through the day before surgery. On the morning of surgery, DO NOT take any medications.   Per your surgeon, as of Thursday 9/7, you need to STOP taking any Aleve, Naproxen, Ibuprofen, Motrin, Advil, Goody's, BC's, all herbal medications, fish oil, and all vitamins.           WHAT DO I DO ABOUT MY DIABETES MEDICATION?   STOP taking Metformin on Sunday, 9/10. You will take your last dose on Saturday, 9/9.   STOP taking FARXIGA three days prior to surgery.  DO NOT take evening dose of glipiZIDE (GLUCOTROL) the day before surgery.  DO NOT take acarbose (PRECOSE) the morning of surgery.  THE NIGHT BEFORE SURGERY, take 45 units of LEVEMIR FLEXTOUCH 100 UNIT/ML Pen.      HOW TO MANAGE YOUR DIABETES BEFORE AND AFTER SURGERY  Why is it important to control my blood sugar before and after surgery? Improving blood sugar levels before and after surgery helps healing and can limit problems. A way of improving blood sugar control is eating a healthy diet by:  Eating less sugar and carbohydrates  Increasing activity/exercise  Talking with your doctor about reaching your blood sugar goals High blood sugars (greater than 180 mg/dL) can raise your risk of infections and slow your recovery, so you will need to focus on controlling your diabetes during the weeks before surgery. Make sure that the doctor who takes care of your diabetes knows about your planned surgery  including the date and location.  How do I manage my blood sugar before surgery? Check your blood sugar at least 4 times a day, starting 2 days before surgery, to make sure that the level is not too high or low.  Check your blood sugar the morning of your surgery when you wake up and every 2 hours until you get to the Short Stay unit.  If your blood sugar is less than 70 mg/dL, you will need to treat for low blood sugar: Do not take insulin. Treat a low blood sugar (less than 70 mg/dL) with  cup of clear juice (cranberry or apple), 4 glucose tablets, OR glucose gel. Recheck blood sugar in 15 minutes after treatment (to make sure it is greater than 70 mg/dL). If your blood sugar is not greater than 70 mg/dL on recheck, call [phone] for further instructions. Report your blood sugar to the short stay nurse when you get to Short Stay.  If you are admitted to the hospital after surgery: Your blood sugar will be checked by the staff and you will probably be given insulin after surgery (instead of oral diabetes medicines) to make sure you have good blood sugar levels. The goal for blood sugar control after surgery is 80-180 mg/dL.              Do NOT Smoke (Tobacco/Vaping) for 24 hours prior to your procedure.  If you use a CPAP at night, you may bring your  mask/headgear for your overnight stay.   Contacts, glasses, piercing's, hearing aid's, dentures or partials may not be worn into surgery, please bring cases for these belongings.    For patients admitted to the hospital, discharge time will be determined by your treatment team.   Patients discharged the day of surgery will not be allowed to drive home, and someone needs to stay with them for 24 hours.  SURGICAL WAITING ROOM VISITATION Patients having surgery or a procedure may have no more than 2 support people in the waiting area - these visitors may rotate.   Children under the age of 15 must have an adult with them who is not the  patient. If the patient needs to stay at the hospital during part of their recovery, the visitor guidelines for inpatient rooms apply. Pre-op nurse will coordinate an appropriate time for 1 support person to accompany patient in pre-op.  This support person may not rotate.   Please refer to the [facility] website for the visitor guidelines for Inpatients (after your surgery is over and you are in a regular room).    Special instructions:   [place]- Preparing For Surgery  Before surgery, you can play an important role. Because skin is not sterile, your skin needs to be as free of germs as possible. You can reduce the number of germs on your skin by washing with CHG (chlorahexidine gluconate) Soap before surgery.  CHG is an antiseptic cleaner which kills germs and bonds with the skin to continue killing germs even after washing.    Oral Hygiene is also important to reduce your risk of infection.  Remember - BRUSH YOUR TEETH THE MORNING OF SURGERY WITH YOUR REGULAR TOOTHPASTE  Please do not use if you have an allergy to CHG or antibacterial soaps. If your skin becomes reddened/irritated stop using the CHG.  Do not shave (including legs and underarms) for at least 48 hours prior to first CHG shower. It is OK to shave your face.  Please follow these instructions carefully.   Shower the NIGHT BEFORE SURGERY and the MORNING OF SURGERY  If you chose to wash your hair, wash your hair first as usual with your normal shampoo.  After you shampoo, rinse your hair and body thoroughly to remove the shampoo.  Use CHG Soap as you would any other liquid soap. You can apply CHG directly to the skin and wash gently with a scrungie or a clean washcloth.   Apply the CHG Soap to your body ONLY FROM THE NECK DOWN.  Do not use on open wounds or open sores. Avoid contact with your eyes, ears, mouth and genitals (private parts). Wash Face and genitals (private parts)  with your normal soap.   Wash thoroughly,  paying special attention to the area where your surgery will be performed.  Thoroughly rinse your body with warm water from the neck down.  DO NOT shower/wash with your normal soap after using and rinsing off the CHG Soap.  Pat yourself dry with a CLEAN TOWEL.  Wear CLEAN PAJAMAS to bed the night before surgery  Place CLEAN SHEETS on your bed the night before your surgery  DO NOT SLEEP WITH PETS.   Day of Surgery: Take a shower with CHG soap. Do not wear jewelry or makeup Do not wear lotions, powders, perfumes/colognes, or deodorant. Do not shave 48 hours prior to surgery.  Men may shave face and neck. Do not bring valuables to the hospital.  [facility] is not  responsible for any belongings or valuables. Do not wear nail polish, gel polish, artificial nails, or any other type of covering on natural nails (fingers and toes) If you have artificial nails or gel coating that need to be removed by a nail salon, please have this removed prior to surgery. Artificial nails or gel coating may interfere with anesthesia's ability to adequately monitor your vital signs.  Wear Clean/Comfortable clothing the morning of surgery Remember to brush your teeth WITH YOUR REGULAR TOOTHPASTE.   Please read over the following fact sheets that you were given.    If you received a COVID test during your pre-op visit  it is requested that you wear a mask when out in public, stay away from anyone that may not be feeling well and notify your surgeon if you develop symptoms. If you have been in contact with anyone that has tested positive in the last 10 days please notify you surgeon.

## 2021-09-13 ENCOUNTER — Other Ambulatory Visit: Payer: Self-pay

## 2021-09-13 ENCOUNTER — Encounter (HOSPITAL_COMMUNITY)
Admission: RE | Admit: 2021-09-13 | Discharge: 2021-09-13 | Disposition: A | Discharge status: Home / Self Care | Payer: Medicare HMO | Source: Ambulatory Visit | Attending: Cardiovascular Disease | Admitting: Cardiovascular Disease

## 2021-09-13 ENCOUNTER — Ambulatory Visit (HOSPITAL_COMMUNITY)
Admission: RE | Admit: 2021-09-13 | Discharge: 2021-09-13 | Disposition: A | Discharge status: Home / Self Care | Payer: Medicare HMO | Source: Ambulatory Visit | Attending: Anesthesiology | Admitting: Anesthesiology

## 2021-09-13 ENCOUNTER — Encounter (HOSPITAL_COMMUNITY): Payer: Self-pay

## 2021-09-13 VITALS — BP 108/57 | HR 72 | Temp 97.9°F | Resp 19 | Ht 69.0 in | Wt 230.0 lb

## 2021-09-13 DIAGNOSIS — E0810 Diabetes mellitus due to underlying condition with ketoacidosis without coma: Secondary | ICD-10-CM

## 2021-09-13 DIAGNOSIS — Z20822 Contact with and (suspected) exposure to covid-19: Secondary | ICD-10-CM | POA: Insufficient documentation

## 2021-09-13 DIAGNOSIS — Z01818 Encounter for other preprocedural examination: Secondary | ICD-10-CM | POA: Diagnosis not present

## 2021-09-13 DIAGNOSIS — I350 Nonrheumatic aortic (valve) stenosis: Secondary | ICD-10-CM | POA: Insufficient documentation

## 2021-09-13 HISTORY — DX: Pneumonia, unspecified organism: J18.9

## 2021-09-13 HISTORY — DX: Cardiac murmur, unspecified: R01.1

## 2021-09-13 LAB — URINALYSIS, ROUTINE W REFLEX MICROSCOPIC
Bacteria, UA: NONE SEEN
Bilirubin Urine: NEGATIVE
Glucose, UA: >=500 mg/dL — AB
Hgb urine dipstick: NEGATIVE
Ketones, ur: NEGATIVE mg/dL
Leukocytes,Ua: NEGATIVE
Nitrite: NEGATIVE
Protein, ur: NEGATIVE mg/dL
Specific Gravity, Urine: 1.012 (ref 1.005–1.030)
pH: 5 (ref 5.0–8.0)

## 2021-09-13 LAB — COMPREHENSIVE METABOLIC PANEL
ALT: 17 U/L (ref 0–44)
AST: 20 U/L (ref 15–41)
Albumin: 4.2 g/dL (ref 3.5–5.0)
Alkaline Phosphatase: 36 U/L — ABNORMAL LOW (ref 38–126)
Anion gap: 10 (ref 5–15)
BUN: 21 mg/dL (ref 8–23)
CO2: 23 mmol/L (ref 22–32)
Calcium: 10.5 mg/dL — ABNORMAL HIGH (ref 8.9–10.3)
Chloride: 108 mmol/L (ref 98–111)
Creatinine, Ser: 1.45 mg/dL — ABNORMAL HIGH (ref 0.61–1.24)
GFR, Estimated: 50 mL/min — ABNORMAL LOW (ref >60)
Glucose, Bld: 170 mg/dL — ABNORMAL HIGH (ref 70–99)
Potassium: 4.3 mmol/L (ref 3.5–5.1)
Sodium: 141 mmol/L (ref 135–145)
Total Bilirubin: 0.8 mg/dL (ref 0.3–1.2)
Total Protein: 7.4 g/dL (ref 6.5–8.1)

## 2021-09-13 LAB — CBC
HCT: 45 % (ref 39.0–52.0)
Hemoglobin: 14.3 g/dL (ref 13.0–17.0)
MCH: 29.3 pg (ref 26.0–34.0)
MCHC: 31.8 g/dL (ref 30.0–36.0)
MCV: 92.2 fL (ref 80.0–100.0)
Platelets: 222 10*3/uL (ref 150–400)
RBC: 4.88 MIL/uL (ref 4.22–5.81)
RDW: 16.1 % — ABNORMAL HIGH (ref 11.5–15.5)
WBC: 9 10*3/uL (ref 4.0–10.5)
nRBC: 0 % (ref 0.0–0.2)

## 2021-09-13 LAB — TYPE AND SCREEN
ABO/RH(D): A POS
Antibody Screen: NEGATIVE

## 2021-09-13 LAB — HEMOGLOBIN A1C
Hgb A1c MFr Bld: 7 % — ABNORMAL HIGH (ref 4.8–5.6)
Mean Plasma Glucose: 154.2 mg/dL

## 2021-09-13 LAB — GLUCOSE, CAPILLARY: Glucose-Capillary: 182 mg/dL — ABNORMAL HIGH (ref 70–99)

## 2021-09-13 LAB — SURGICAL PCR SCREEN
MRSA, PCR: POSITIVE — AB
Staphylococcus aureus: POSITIVE — AB

## 2021-09-13 LAB — PROTIME-INR
INR: 1 (ref 0.8–1.2)
Prothrombin Time: 13.1 seconds (ref 11.4–15.2)

## 2021-09-13 LAB — SARS CORONAVIRUS 2 (TAT 6-24 HRS): SARS Coronavirus 2: NEGATIVE

## 2021-09-13 NOTE — Progress Notes (Signed)
PCP - lawrence fusco Cardiologist -   PPM/ICD -  Device Orders -  Rep Notified -   Chest x-ray - 09/13/21 EKG - 09/02/21 Stress Test - 05/29/16 ECHO - 06/19/21 Cardiac Cath - 07/15/21  Sleep Study - no OSA   Fasting Blood Sugar - 4 times a day. Fasting CBG around 100 every morning.   Continue taking all other medications including Aspirin and Plavix (Clopidogrel)  without change through the day before surgery. On the morning of surgery, DO NOT take any medications.     Per your surgeon, as of Thursday 9/7, you need to STOP taking any Aleve, Naproxen, Ibuprofen, Motrin, Advil, Goody's, BC's, all herbal medications, fish oil, and all vitamins.  ERAS Protcol -no   COVID TEST- 09/13/21   Anesthesia review: yes, TAVR  Patient denies shortness of breath, fever, cough and chest pain at PAT appointment   All instructions explained to the patient, with a verbal understanding of the material. Patient agrees to go over the instructions while at home for a better understanding. Patient also instructed to self quarantine after being tested for COVID-19. The opportunity to ask questions was provided.

## 2021-09-13 NOTE — Pre-Procedure Instructions (Signed)
Surgical Instructions    Your procedure is scheduled on September 17, 2021.  Report to [facility] Main Entrance "A" at 7:15 A.M., then check in with the Admitting office.  Call this number if you have problems the morning of surgery:  [phone]   If you have any questions prior to your surgery date call [phone]: Open Monday-Friday 8am-4pm    Remember:  Do not eat or drink after midnight the night before your surgery     Continue taking all other medications including Aspirin and Plavix (Clopidogrel)  without change through the day before surgery. On the morning of surgery, DO NOT take any medications.   Per your surgeon, as of Thursday 9/7, you need to STOP taking any Aleve, Naproxen, Ibuprofen, Motrin, Advil, Goody's, BC's, all herbal medications, fish oil, and all vitamins.           WHAT DO I DO ABOUT MY DIABETES MEDICATION?   STOP taking Metformin on Sunday, 9/10. You will take your last dose on Saturday, 9/9.   STOP taking FARXIGA three days prior to surgery.  DO NOT take evening dose of glipiZIDE (GLUCOTROL) the day before surgery and do not take the morning of surgery.  DO NOT take acarbose (PRECOSE) the morning of surgery.  THE NIGHT BEFORE SURGERY, take 45 units of LEVEMIR FLEXTOUCH 100 UNIT/ML Pen.      HOW TO MANAGE YOUR DIABETES BEFORE AND AFTER SURGERY  Why is it important to control my blood sugar before and after surgery? Improving blood sugar levels before and after surgery helps healing and can limit problems. A way of improving blood sugar control is eating a healthy diet by:  Eating less sugar and carbohydrates  Increasing activity/exercise  Talking with your doctor about reaching your blood sugar goals High blood sugars (greater than 180 mg/dL) can raise your risk of infections and slow your recovery, so you will need to focus on controlling your diabetes during the weeks before surgery. Make sure that the doctor who takes care of your  diabetes knows about your planned surgery including the date and location.  How do I manage my blood sugar before surgery? Check your blood sugar at least 4 times a day, starting 2 days before surgery, to make sure that the level is not too high or low.  Check your blood sugar the morning of your surgery when you wake up and every 2 hours until you get to the Short Stay unit.  If your blood sugar is less than 70 mg/dL, you will need to treat for low blood sugar: Do not take insulin. Treat a low blood sugar (less than 70 mg/dL) with  cup of clear juice (cranberry or apple), 4 glucose tablets, OR glucose gel. Recheck blood sugar in 15 minutes after treatment (to make sure it is greater than 70 mg/dL). If your blood sugar is not greater than 70 mg/dL on recheck, call [phone] for further instructions. Report your blood sugar to the short stay nurse when you get to Short Stay.  If you are admitted to the hospital after surgery: Your blood sugar will be checked by the staff and you will probably be given insulin after surgery (instead of oral diabetes medicines) to make sure you have good blood sugar levels. The goal for blood sugar control after surgery is 80-180 mg/dL.              Do NOT Smoke (Tobacco/Vaping) for 24 hours prior to your procedure.  If you use  a CPAP at night, you may bring your mask/headgear for your overnight stay.   Contacts, glasses, piercing's, hearing aid's, dentures or partials may not be worn into surgery, please bring cases for these belongings.    For patients admitted to the hospital, discharge time will be determined by your treatment team.   Patients discharged the day of surgery will not be allowed to drive home, and someone needs to stay with them for 24 hours.  SURGICAL WAITING ROOM VISITATION Patients having surgery or a procedure may have no more than 2 support people in the waiting area - these visitors may rotate.   Children under the age of 44  must have an adult with them who is not the patient. If the patient needs to stay at the hospital during part of their recovery, the visitor guidelines for inpatient rooms apply. Pre-op nurse will coordinate an appropriate time for 1 support person to accompany patient in pre-op.  This support person may not rotate.   Please refer to the [facility] website for the visitor guidelines for Inpatients (after your surgery is over and you are in a regular room).    Special instructions:   [place]- Preparing For Surgery  Before surgery, you can play an important role. Because skin is not sterile, your skin needs to be as free of germs as possible. You can reduce the number of germs on your skin by washing with CHG (chlorahexidine gluconate) Soap before surgery.  CHG is an antiseptic cleaner which kills germs and bonds with the skin to continue killing germs even after washing.    Oral Hygiene is also important to reduce your risk of infection.  Remember - BRUSH YOUR TEETH THE MORNING OF SURGERY WITH YOUR REGULAR TOOTHPASTE  Please do not use if you have an allergy to CHG or antibacterial soaps. If your skin becomes reddened/irritated stop using the CHG.  Do not shave (including legs and underarms) for at least 48 hours prior to first CHG shower. It is OK to shave your face.  Please follow these instructions carefully.   Shower the NIGHT BEFORE SURGERY and the MORNING OF SURGERY  If you chose to wash your hair, wash your hair first as usual with your normal shampoo.  After you shampoo, rinse your hair and body thoroughly to remove the shampoo.  Use CHG Soap as you would any other liquid soap. You can apply CHG directly to the skin and wash gently with a scrungie or a clean washcloth.   Apply the CHG Soap to your body ONLY FROM THE NECK DOWN.  Do not use on open wounds or open sores. Avoid contact with your eyes, ears, mouth and genitals (private parts). Wash Face and genitals (private parts)   with your normal soap.   Wash thoroughly, paying special attention to the area where your surgery will be performed.  Thoroughly rinse your body with warm water from the neck down.  DO NOT shower/wash with your normal soap after using and rinsing off the CHG Soap.  Pat yourself dry with a CLEAN TOWEL.  Wear CLEAN PAJAMAS to bed the night before surgery  Place CLEAN SHEETS on your bed the night before your surgery  DO NOT SLEEP WITH PETS.   Day of Surgery: Take a shower with CHG soap. Do not wear jewelry or makeup Do not wear lotions, powders, perfumes/colognes, or deodorant. Do not shave 48 hours prior to surgery.  Men may shave face and neck. Do not bring valuables  to the hospital.  [facility] is not responsible for any belongings or valuables. Do not wear nail polish, gel polish, artificial nails, or any other type of covering on natural nails (fingers and toes) If you have artificial nails or gel coating that need to be removed by a nail salon, please have this removed prior to surgery. Artificial nails or gel coating may interfere with anesthesia's ability to adequately monitor your vital signs.  Wear Clean/Comfortable clothing the morning of surgery Remember to brush your teeth WITH YOUR REGULAR TOOTHPASTE.   Please read over the following fact sheets that you were given.    If you received a COVID test during your pre-op visit  it is requested that you wear a mask when out in public, stay away from anyone that may not be feeling well and notify your surgeon if you develop symptoms. If you have been in contact with anyone that has tested positive in the last 10 days please notify you surgeon.

## 2021-09-13 NOTE — Progress Notes (Signed)
Notified Theodosia Quay of positive mrsa and staph result from mrsa swab.

## 2021-09-16 ENCOUNTER — Telehealth: Payer: Self-pay | Admitting: Cardiovascular Disease

## 2021-09-16 DIAGNOSIS — E6609 Other obesity due to excess calories: Secondary | ICD-10-CM | POA: Diagnosis not present

## 2021-09-16 DIAGNOSIS — G894 Chronic pain syndrome: Secondary | ICD-10-CM | POA: Diagnosis not present

## 2021-09-16 DIAGNOSIS — Z6834 Body mass index (BMI) 34.0-34.9, adult: Secondary | ICD-10-CM | POA: Diagnosis not present

## 2021-09-16 MED ORDER — NOREPINEPHRINE 4 MG/250ML-% IV SOLN
0.0000 ug/min | INTRAVENOUS | Status: DC
  Filled 2021-09-16: qty 250

## 2021-09-16 MED ORDER — HEPARIN 30,000 UNITS/1000 ML (OHS) CELLSAVER SOLUTION
Status: DC
  Filled 2021-09-16 (×2): qty 1000

## 2021-09-16 MED ORDER — CEFAZOLIN SODIUM-DEXTROSE 2-4 GM/100ML-% IV SOLN
2.0000 g | INTRAVENOUS | Status: AC
  Administered 2021-09-17: 2 g via INTRAVENOUS
  Filled 2021-09-16: qty 100

## 2021-09-16 MED ORDER — DEXMEDETOMIDINE HCL IN NACL 400 MCG/100ML IV SOLN
0.1000 ug/kg/h | INTRAVENOUS | Status: AC
  Administered 2021-09-17: 1 ug/kg/h via INTRAVENOUS
  Filled 2021-09-16 (×2): qty 100

## 2021-09-16 MED ORDER — MAGNESIUM SULFATE 50 % IJ SOLN
40.0000 meq | INTRAMUSCULAR | Status: DC
  Filled 2021-09-16 (×2): qty 9.85

## 2021-09-16 MED ORDER — POTASSIUM CHLORIDE 2 MEQ/ML IV SOLN
80.0000 meq | INTRAVENOUS | Status: DC
  Filled 2021-09-16 (×2): qty 40

## 2021-09-16 NOTE — Telephone Encounter (Signed)
Granddaughter called to follow-up on FMLA paperwork she submitted to be her grandfather's care giver after his surgery.  Granddaughter stated the paperwork was turned in by the patient last week at his appointment.

## 2021-09-16 NOTE — Telephone Encounter (Signed)
Called patient's granddaughter about FMLA paperwork. Per Nell Range, PA, she will not be able to fill out papers until they know what day patient is being discharge. Joellen Jersey stated she had talk to her about this. Patient's granddaughter stated she will talk to her tomorrow or someone at the hospital, since her work is asking for paperwork.

## 2021-09-16 NOTE — H&P (Signed)
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 radial artery. 3 mg Verapamil was given through the sheath. Weight based IV heparin was given. Standard diagnostic catheters were used to perform selective coronary angiography. The right groin was prepped and draped. A 7 French sheath was placed in the right femoral vein. Right heart catheterization performed with a balloon tipped catheter. I did not  cross the aortic valve.   PCI Note: I engaged the left main with a XB LAD 3.5 guiding catheter. I then passed a Cougar IC wire down the LAD. Additional IV heparin was given. Plavix 600 mg po x 1. I dilated the mid LAD with a 2.0 x 12 mm balloon but the calcified lesion did not yield. I then used a 3.0 x 10 mm Shockwave balloon in the mid LAD and performed 40 bursts of energy for intracoronary lithotripsy. I then deployed a 3.5 x 16 mm Synergy DES in the mid LAD. The stent was post-dilated with a 3.75 x 10 mm [place] balloon.   All catheter exchanges were performed over an exchange length guidewire.   The sheath was removed from the right radial artery and a hemostasis band was applied at the arteriotomy site on the right wrist.      Estimated blood loss <50 mL.   During this procedure medications were administered to achieve and maintain moderate conscious sedation while the patient's heart rate, blood pressure, and oxygen saturation were continuously monitored and I was present face-to-face 100% of this time.    Medications (Filter: Administrations occurring from [phone] to 1031 on 07/15/21)  important  Continuous medications are totaled by the amount administered until 07/15/21 1031.    fentaNYL (SUBLIMAZE) injection (mcg) Total dose:  50 mcg  Date/Time Rate/Dose/Volume Action  07/15/21 0859 25 mcg Given    0912 25 mcg Given      midazolam (VERSED) injection (mg) Total dose:  2 mg  Date/Time Rate/Dose/Volume Action    07/15/21 0859 1 mg Given    0912 1 mg Given      lidocaine (PF) (XYLOCAINE) 1 % injection (mL) Total volume:  16 mL  Date/Time Rate/Dose/Volume Action    07/15/21 0902 2 mL Given    0905 2 mL Given    0914 12 mL Given      Heparin (Porcine) in NaCl 1000-0.9 UT/500ML-% SOLN (mL) Total volume:  1,000 mL  Date/Time Rate/Dose/Volume Action    07/15/21 0903 500 mL Given    0903 500 mL Given      Radial Cocktail/Verapamil only (mL) Total volume:  10  mL  Date/Time Rate/Dose/Volume Action    07/15/21 0908 10 mL Given      heparin sodium (porcine) injection (Units) Total dose:  14,000 Units  Date/Time Rate/Dose/Volume Action    07/15/21 0932 5,000 Units Given    0936 6,000 Units Given    0949 3,000 Units Given      clopidogrel (PLAVIX) tablet (mg) Total dose:  600 mg  Date/Time Rate/Dose/Volume Action    07/15/21 0939 600 mg Given      famotidine (PEPCID) IVPB 20 mg premix (mg) Total dose:  20 mg  Date/Time Rate/Dose/Volume Action    07/15/21 0940 20 mg New Bag/Given    1001  (over 30 min) Stopped      iohexol (OMNIPAQUE) 350 MG/ML injection (mL) Total volume:  130 mL  Date/Time Rate/Dose/Volume Action    07/15/21 1016 130 mL Given      Sedation Time   Sedation Time Physician-1: 1 hour 13 minutes 27 seconds Contrast   Medication Name Total Dose  iohexol (OMNIPAQUE) 350 MG/ML injection 130 mL    Radiation/Fluoro   Fluoro time: 14.8 (min) DAP: 55282 (mGycm2) Cumulative Air Kerma: 8588 (mGy) Complications      Complications documented before study signed (07/15/2021 50:27 AM)     No complications were associated with this study.  Documented by Renne Musca, RT - 07/15/2021 10:21 AM      Coronary Findings   Diagnostic Dominance: Right Left Anterior Descending  Vessel is large.  Mid LAD-1 lesion is 95% stenosed. The lesion is calcified.  Mid LAD-2 lesion is 50% stenosed.    First Diagonal Branch  1st Diag lesion is 50% stenosed.    Left Circumflex  Vessel is large.  Prox Cx to Mid Cx lesion is 40% stenosed. The lesion is calcified.  Dist Cx lesion is 50% stenosed.    Second Obtuse Marginal Branch  2nd Mrg lesion is 40% stenosed.    Right Coronary Artery  Vessel is large.  Prox RCA lesion is 100% stenosed. The lesion is chronically occluded.  Previously placed Dist RCA stent of unknown type is widely patent.    Third Right Posterolateral Branch  Collaterals  3rd RPL filled by collaterals from  2nd Sept.       Intervention    Mid LAD-1 lesion  Stent  CATH [place]ISTA GUIDE 6FR XBLAD3.5 guide catheter was inserted. Lesion crossed with guidewire using a WIRE COUGAR XT STRL 190CM. Pre-stent angioplasty was performed using a BALLN SAPPHIRE 2.0X12. A drug-eluting stent was successfully placed using a SYNERGY XD 3.50X16. Stent strut is well apposed. Post-stent angioplasty was performed using a BALL SAPPHIRE NC24 3.75X10.  Post-Intervention Lesion Assessment  The intervention was successful. Pre-interventional  TIMI flow is 3. Post-intervention TIMI flow is 3. No complications occurred at this lesion.  There is a 0% residual stenosis post intervention.      Coronary Diagrams   Diagnostic Dominance: Right  Intervention    Implants         Permanent Stent   Synergy Xd 3.50x16 - [id number] - Implanted  Inventory item: SYNERGY XD 3.50X16 Model/Cat number: [id number]  Manufacturer: Wilkie Aye Lot number: [id number]  Device identifier: [id number] Device identifier type: GS1  GUDID Information   Request status Successful      Brand name: [place] Rote Version/Model: [id number]  Company name: [employer] MRI safety info as of 07/15/21: MR Conditional  Contains dry or latex rubber: No      GMDN P.T. name: Drug-eluting coronary artery stent, non-bioabsorbable-polymer-coated        As of 07/15/2021   Status: Implanted          Syngo Images    Show images for CARDIAC CATHETERIZATION Images on Long Term Storage    Show images for Jaziah, Goeller to Procedure Log   Procedure Log    Hemo Data   Flowsheet Row Most Recent Value  Fick Cardiac Output 4.37 L/min  Fick Cardiac Output Index 2 (L/min)/BSA  RA A Wave 12 mmHg  RA V Wave 8 mmHg  RA Mean 6 mmHg  RV Systolic Pressure 47 mmHg  RV Diastolic Pressure 13 mmHg  RV EDP 14 mmHg  PA Systolic Pressure 35 mmHg  PA Diastolic Pressure 15 mmHg  PA Mean 29 mmHg  PW A Wave 16 mmHg  PW V Wave 16 mmHg   PW Mean 9 mmHg  AO Systolic Pressure 024 mmHg  AO Diastolic Pressure 52 mmHg  AO Mean 77 mmHg  QP/QS 1  TPVR Index 14.48 HRUI  TSVR Index 38.45 HRUI  PVR SVR Ratio 0.28  TPVR/TSVR Ratio 0.38      ADDENDUM REPORT: 07/26/2021 15:16   EXAM: OVER-READ INTERPRETATION  CT CHEST   The following report is an over-read performed by radiologist Dr. Maudry Diego [facility] on 07/26/2021. This over-read does not include interpretation of cardiac or coronary anatomy or pathology. The Cardiac TAVR CT interpretation by the cardiologist is attached.   COMPARISON:  None.   FINDINGS: Extracardiac findings will be described separately under dictation for contemporaneously obtained CTA chest, abdomen and pelvis.   IMPRESSION: Please see separate dictation for contemporaneously obtained CTA chest, abdomen and pelvis dated 07/26/2021 for full description of relevant extracardiac findings.     Electronically Signed   By: Yetta Glassman M.D.   On: 07/26/2021 15:16    Addended by [id number], Dorris Singh, MD on 07/26/2021  3:18 PM    Study Result   Narrative & Impression  CLINICAL DATA:  Aortic Stenosis   EXAM: Cardiac TAVR CT   TECHNIQUE: The patient was scanned on a Siemens Force 426 slice scanner. A 120 kV retrospective scan was triggered in the ascending thoracic aorta at 140 HU's. Gantry rotation speed was 250 msecs and collimation was .6 mm. No beta blockade or nitro were given. The 3D data set was reconstructed in 5% intervals of the R-R cycle. Systolic and diastolic phases were analyzed on a dedicated work station using MPR, MIP and VRT modes. The patient received 80 cc of contrast.   FINDINGS: Aortic Valve: Calcified tri leaflet AV score 2845   Aorta: No aneurysm normal variant bovine arch moderate calcific atherosclerosis   Sino-tubular Junction: 27 mm  Ascending Thoracic Aorta: 32 mm   Aortic Arch: 26 mm   Descending Thoracic Aorta: 25 mm    Sinus of Valsalva Measurements:   Non-coronary: 31 mm   Right - coronary: 30 mm Height 17.8 mm   Left -   coronary: 33.3 mm  Height 16.7 mm   Coronary Artery Height above Annulus:   Left Main: 14 mm above annulus   Right Coronary: 16.4 mm above annulus   Virtual Basal Annulus Measurements:   Maximum / Minimum Diameter: 25.6 mm x 20.7 mm   Perimeter: 73.3 mm   Area: 411.5 mm 2   Coronary Arteries: Sufficient height above annulus for deployment   Optimum Fluoroscopic Angle for Delivery: LAO 12 Caudal 16 degrees   Membranous septal length 8.7 mm   IMPRESSION: 1. Calcified tri leaflet AV with score 2845   2. Annular area of 411.5 mm2 suitable for a 23 mm Sapien 3 valve May be more favorable to place a 29 mm Medtronic Evolut However access to patients coronaries may be an issue as he has Significant 2 vessel CAD with recent stenting of the mid LAD on 07/15/21   3.  Membranous septal length 8.7 mm   4. Optimum angiographic angle for deployment LAO 12 Caudal 16 degrees   5.  Coronary arteries sufficient height above annulus for deployment   [name]   Electronically Signed: By: [name] M.D. On: 07/26/2021 11:57      Narrative & Impression  CLINICAL DATA:  Preop evaluation for TAVR   EXAM: CT ANGIOGRAPHY CHEST, ABDOMEN AND PELVIS   TECHNIQUE: Non-contrast CT of the chest was initially obtained.   Multidetector CT imaging through the chest, abdomen and pelvis was performed using the standard protocol during bolus administration of intravenous contrast. Multiplanar reconstructed images and MIPs were obtained and reviewed to evaluate the vascular anatomy.   RADIATION DOSE REDUCTION: This exam was performed according to the departmental dose-optimization program which includes automated exposure control, adjustment of the mA and/or kV according to patient size and/or use of iterative reconstruction technique.   CONTRAST:  181m OMNIPAQUE IOHEXOL  350 MG/ML SOLN   COMPARISON:  Lung cancer screening CT dated April 04, 2021   FINDINGS: CTA CHEST FINDINGS   Cardiovascular: Normal heart size. No pericardial effusion. Caliber thoracic aorta with moderate atherosclerotic disease. Severe aortic valve thickening and calcifications. Left main and three-vessel coronary artery calcifications. No suspicious filling defects of the pulmonary arteries.   Mediastinum/Nodes: Numerous bilateral thyroid nodules, largest is a right thyroid nodule measuring 1.1 cm on series 11, image 50.   Lungs/Pleura: Central airways are patent. No consolidation, pleural effusion or pneumothorax. Calcified granuloma of the right upper lobe. Solid pulmonary nodule of the left upper lobe measuring 5 mm, unchanged when compared with lung cancer screening CT dated April 04, 2021, recommend continued follow-up per prior recommendations.   Musculoskeletal: No chest wall abnormality. No acute or significant osseous findings.   CTA ABDOMEN AND PELVIS FINDINGS   Hepatobiliary: No focal liver abnormality is seen. No gallstones, gallbladder wall thickening, or biliary dilatation.   Pancreas: Unremarkable. No pancreatic ductal dilatation or surrounding inflammatory changes.   Spleen: Normal in size without focal abnormality.   Adrenals/Urinary Tract: Adrenal glands are unremarkable. Kidneys are normal, without renal calculi, focal lesion, or hydronephrosis. Small stone of the left ureterovesicular junction measuring 4 mm.   Stomach/Bowel: Stomach is within normal limits. Appendix is not visualized, although there are no secondary findings of acute appendicitis. Diverticulosis. No  evidence of bowel wall thickening, distention, or inflammatory changes.   Vascular/lymphatic: Moderate to severe atherosclerotic disease of the abdominal aorta consisting of calcified and noncalcified plaque. No significant stenosis of the branch vessels.   Reproductive:  Prostatomegaly.   Other: Complex ventral abdominal wall hernia containing fat, fluid and a portion of the transverse colon. No abdominopelvic ascites.   Musculoskeletal: No acute or significant osseous findings.   VASCULAR MEASUREMENTS PERTINENT TO TAVR:   AORTA:   Minimal Aortic Diameter-12.7 mm   Severity of Aortic Calcification-severe   RIGHT PELVIS:   Right Common Iliac Artery -   Minimal Diameter-7.3 mm   Tortuosity-none   Calcification-severe   Right External Iliac Artery -   Minimal Diameter-8.0 mm   Tortuosity-mild   Calcification-none   Right Common Femoral Artery -   Minimal Diameter-7.9 mm   Tortuosity-moderate   Calcification-none   LEFT PELVIS:   Left Common Iliac Artery -   Minimal Diameter-7.7 mm   Tortuosity-mild   Calcification-mild   Left External Iliac Artery -   Minimal Diameter-7.8 mm   Tortuosity-moderate   Calcification-none   Left Common Femoral Artery -   Minimal Diameter-8.3 mm   Tortuosity-none   Calcification-none   Review of the MIP images confirms the above findings.   IMPRESSION: Vascular:   1. Vascular findings and measurements pertinent to potential TAVR procedure, as detailed above. 2. Thickening and calcification of the aortic valve, compatible with reported clinical history of aortic stenosis. 3. Severe aortoiliac atherosclerosis. Left main and 3 vessel coronary artery disease.   Nonvascular:   1. Small stone of the left ureterovesicular junction measuring 4 mm, likely nonobstructive given lack of hydronephrosis. Recommend urologic consultation. 2. Right thyroid nodule measuring 1.1 cm. Recommend thyroid ultrasound for further evaluation. 3. Complex ventral abdominal wall hernia containing fat, fluid and a portion of the transverse colon.   These results (left ureterovesicular junction stone) will be called to the ordering clinician or representative by the Radiologist Assistant, and  communication documented in the PACS or [employer].     Electronically Signed   By: Yetta Glassman M.D.   On: 07/26/2021 15:32      Impression:   This 76 year old gentleman has stage D, severe, symptomatic aortic stenosis with NYHA class II symptoms of exertional fatigue and shortness of breath consistent with chronic diastolic congestive heart failure.  I have personally reviewed his 2D echocardiogram, cardiac catheterization, and CTA studies.  His echo shows a severely calcified and thickened aortic valve with restricted leaflet mobility.  The mean gradient across the aortic valve is increased to 41.5 mmHg with a valve area of 0.61 cm consistent with severe aortic stenosis.  Left ventricular ejection fraction is normal with grade 2 diastolic dysfunction.  His catheterization showed a high-grade LAD stenosis that was treated with intracoronary lithotripsy and PCI/DES.  I agree that aortic valve replacement is indicated in this patient for relief of his symptoms and to prevent left ventricular dysfunction.  Given his age and comorbid risk factors I think that transcatheter aortic valve replacement would be the best treatment for him.  His gated cardiac CTA shows anatomy suitable for TAVR using a SAPIEN 3 valve.  His abdominal and pelvic CTA shows adequate pelvic vascular anatomy to allow transfemoral insertion.   The patient was counseled at length regarding treatment alternatives for management of severe symptomatic aortic stenosis. The risks and benefits of surgical intervention has been discussed in detail. Long-term prognosis with medical therapy was discussed. Alternative approaches  such as conventional surgical aortic valve replacement, transcatheter aortic valve replacement, and palliative medical therapy were compared and contrasted at length. This discussion was placed in the context of the patient's own specific clinical presentation and past medical history. All of his questions have  been addressed.    Following the decision to proceed with transcatheter aortic valve replacement, a discussion was held regarding what types of management strategies would be attempted intraoperatively in the event of life-threatening complications, including whether or not the patient would be considered a candidate for the use of cardiopulmonary bypass and/or conversion to open sternotomy for attempted surgical intervention.  I think he would be a candidate for emergent sternotomy to manage any intraoperative complications.  The patient is aware of the fact that transient use of cardiopulmonary bypass may be necessary. The patient has been advised of a variety of complications that might develop including but not limited to risks of death, stroke, paravalvular leak, aortic dissection or other major vascular complications, aortic annulus rupture, device embolization, cardiac rupture or perforation, mitral regurgitation, acute myocardial infarction, arrhythmia, heart block or bradycardia requiring permanent pacemaker placement, congestive heart failure, respiratory failure, renal failure, pneumonia, infection, other late complications related to structural valve deterioration or migration, or other complications that might ultimately cause a temporary or permanent loss of functional independence or other long term morbidity. The patient provides full informed consent for the procedure as described and all questions were answered.       Plan:   Transfemoral TAVR using a SAPIEN 3 valve.        Gaye Pollack, MD

## 2021-09-17 ENCOUNTER — Inpatient Hospital Stay (HOSPITAL_COMMUNITY): Payer: Medicare HMO

## 2021-09-17 ENCOUNTER — Inpatient Hospital Stay (HOSPITAL_COMMUNITY): Payer: Medicare HMO | Admitting: Physician Assistant

## 2021-09-17 ENCOUNTER — Other Ambulatory Visit: Payer: Self-pay

## 2021-09-17 ENCOUNTER — Encounter (HOSPITAL_COMMUNITY): Payer: Self-pay | Admitting: Cardiovascular Disease

## 2021-09-17 ENCOUNTER — Encounter (HOSPITAL_COMMUNITY)
Admission: RE | Disposition: A | Discharge status: Home / Self Care | Payer: Self-pay | Source: Home / Self Care | Attending: Cardiovascular Disease

## 2021-09-17 ENCOUNTER — Inpatient Hospital Stay (HOSPITAL_COMMUNITY)
Admission: RE | Admit: 2021-09-17 | Discharge: 2021-09-18 | DRG: 267 | Disposition: A | Discharge status: Home / Self Care | Payer: Medicare HMO | Attending: Cardiovascular Disease | Admitting: Cardiovascular Disease

## 2021-09-17 ENCOUNTER — Inpatient Hospital Stay (HOSPITAL_COMMUNITY): Payer: Medicare HMO | Admitting: Anesthesiology

## 2021-09-17 DIAGNOSIS — Z79899 Other long term (current) drug therapy: Secondary | ICD-10-CM

## 2021-09-17 DIAGNOSIS — I25110 Atherosclerotic heart disease of native coronary artery with unstable angina pectoris: Secondary | ICD-10-CM | POA: Diagnosis not present

## 2021-09-17 DIAGNOSIS — I350 Nonrheumatic aortic (valve) stenosis: Secondary | ICD-10-CM

## 2021-09-17 DIAGNOSIS — Z8546 Personal history of malignant neoplasm of prostate: Secondary | ICD-10-CM

## 2021-09-17 DIAGNOSIS — I2510 Atherosclerotic heart disease of native coronary artery without angina pectoris: Secondary | ICD-10-CM

## 2021-09-17 DIAGNOSIS — N183 Chronic kidney disease, stage 3 unspecified: Secondary | ICD-10-CM | POA: Diagnosis not present

## 2021-09-17 DIAGNOSIS — E1122 Type 2 diabetes mellitus with diabetic chronic kidney disease: Secondary | ICD-10-CM | POA: Diagnosis not present

## 2021-09-17 DIAGNOSIS — Z952 Presence of prosthetic heart valve: Secondary | ICD-10-CM | POA: Diagnosis not present

## 2021-09-17 DIAGNOSIS — M1990 Unspecified osteoarthritis, unspecified site: Secondary | ICD-10-CM | POA: Diagnosis present

## 2021-09-17 DIAGNOSIS — I10 Essential (primary) hypertension: Secondary | ICD-10-CM | POA: Diagnosis not present

## 2021-09-17 DIAGNOSIS — Z794 Long term (current) use of insulin: Secondary | ICD-10-CM

## 2021-09-17 DIAGNOSIS — Z7984 Long term (current) use of oral hypoglycemic drugs: Secondary | ICD-10-CM

## 2021-09-17 DIAGNOSIS — Z7982 Long term (current) use of aspirin: Secondary | ICD-10-CM | POA: Diagnosis not present

## 2021-09-17 DIAGNOSIS — Z833 Family history of diabetes mellitus: Secondary | ICD-10-CM

## 2021-09-17 DIAGNOSIS — Z7902 Long term (current) use of antithrombotics/antiplatelets: Secondary | ICD-10-CM

## 2021-09-17 DIAGNOSIS — Z87891 Personal history of nicotine dependence: Secondary | ICD-10-CM

## 2021-09-17 DIAGNOSIS — I214 Non-ST elevation (NSTEMI) myocardial infarction: Secondary | ICD-10-CM | POA: Diagnosis present

## 2021-09-17 DIAGNOSIS — E0810 Diabetes mellitus due to underlying condition with ketoacidosis without coma: Secondary | ICD-10-CM

## 2021-09-17 DIAGNOSIS — I480 Paroxysmal atrial fibrillation: Secondary | ICD-10-CM | POA: Diagnosis present

## 2021-09-17 DIAGNOSIS — I129 Hypertensive chronic kidney disease with stage 1 through stage 4 chronic kidney disease, or unspecified chronic kidney disease: Secondary | ICD-10-CM | POA: Diagnosis not present

## 2021-09-17 DIAGNOSIS — I080 Rheumatic disorders of both mitral and aortic valves: Secondary | ICD-10-CM

## 2021-09-17 DIAGNOSIS — I248 Other forms of acute ischemic heart disease: Secondary | ICD-10-CM | POA: Diagnosis present

## 2021-09-17 DIAGNOSIS — Z955 Presence of coronary angioplasty implant and graft: Secondary | ICD-10-CM

## 2021-09-17 DIAGNOSIS — Z8249 Family history of ischemic heart disease and other diseases of the circulatory system: Secondary | ICD-10-CM | POA: Diagnosis not present

## 2021-09-17 DIAGNOSIS — I447 Left bundle-branch block, unspecified: Secondary | ICD-10-CM | POA: Diagnosis not present

## 2021-09-17 DIAGNOSIS — Z7983 Long term (current) use of bisphosphonates: Secondary | ICD-10-CM | POA: Diagnosis not present

## 2021-09-17 DIAGNOSIS — E7800 Pure hypercholesterolemia, unspecified: Secondary | ICD-10-CM | POA: Diagnosis present

## 2021-09-17 DIAGNOSIS — Z006 Encounter for examination for normal comparison and control in clinical research program: Secondary | ICD-10-CM | POA: Diagnosis not present

## 2021-09-17 DIAGNOSIS — Z800 Family history of malignant neoplasm of digestive organs: Secondary | ICD-10-CM

## 2021-09-17 DIAGNOSIS — K219 Gastro-esophageal reflux disease without esophagitis: Secondary | ICD-10-CM | POA: Diagnosis present

## 2021-09-17 DIAGNOSIS — E119 Type 2 diabetes mellitus without complications: Secondary | ICD-10-CM

## 2021-09-17 DIAGNOSIS — I252 Old myocardial infarction: Secondary | ICD-10-CM | POA: Diagnosis not present

## 2021-09-17 DIAGNOSIS — E785 Hyperlipidemia, unspecified: Secondary | ICD-10-CM | POA: Diagnosis present

## 2021-09-17 HISTORY — DX: Presence of prosthetic heart valve: Z95.2

## 2021-09-17 HISTORY — PX: INTRAOPERATIVE TRANSTHORACIC ECHOCARDIOGRAM: SHX6523

## 2021-09-17 HISTORY — PX: TRANSCATHETER AORTIC VALVE REPLACEMENT, TRANSFEMORAL: SHX6400

## 2021-09-17 LAB — POCT I-STAT, CHEM 8
BUN: 15 mg/dL (ref 8–23)
Calcium, Ion: 1.36 mmol/L (ref 1.15–1.40)
Chloride: 107 mmol/L (ref 98–111)
Creatinine, Ser: 1.1 mg/dL (ref 0.61–1.24)
Glucose, Bld: 172 mg/dL — ABNORMAL HIGH (ref 70–99)
HCT: 40 % (ref 39.0–52.0)
Hemoglobin: 13.6 g/dL (ref 13.0–17.0)
Potassium: 4.3 mmol/L (ref 3.5–5.1)
Sodium: 142 mmol/L (ref 135–145)
TCO2: 23 mmol/L (ref 22–32)

## 2021-09-17 LAB — GLUCOSE, CAPILLARY
Glucose-Capillary: 180 mg/dL — ABNORMAL HIGH (ref 70–99)
Glucose-Capillary: 155 mg/dL — ABNORMAL HIGH (ref 70–99)
Glucose-Capillary: 187 mg/dL — ABNORMAL HIGH (ref 70–99)
Glucose-Capillary: 177 mg/dL — ABNORMAL HIGH (ref 70–99)

## 2021-09-17 LAB — ECHOCARDIOGRAM LIMITED
AR max vel: 2.52 cm2
AV Area VTI: 2.01 cm2
AV Area mean vel: 1.19 cm2
AV Mean grad: 8 mmHg
AV Peak grad: 13.7 mmHg
Ao pk vel: 1.85 m/s
Calc EF: 67.9 %
Single Plane A2C EF: 72.1 %
Single Plane A4C EF: 60.9 %

## 2021-09-17 LAB — ABO/RH: ABO/RH(D): A POS

## 2021-09-17 SURGERY — IMPLANTATION, AORTIC VALVE, TRANSCATHETER, FEMORAL APPROACH
Anesthesia: Monitor Anesthesia Care

## 2021-09-17 MED ORDER — TRAMADOL HCL 50 MG PO TABS: 50.0000 mg | ORAL_TABLET | ORAL | Status: DC | PRN

## 2021-09-17 MED ORDER — ASPIRIN 81 MG PO TBEC: 81.0000 mg | DELAYED_RELEASE_TABLET | Freq: Every day | ORAL | Status: DC

## 2021-09-17 MED ORDER — CHLORHEXIDINE GLUCONATE 4 % EX LIQD
30.0000 mL | CUTANEOUS | Status: DC
  Filled 2021-09-17: qty 30

## 2021-09-17 MED ORDER — LACTATED RINGERS IV SOLN: INTRAVENOUS | Status: DC

## 2021-09-17 MED ORDER — FENTANYL CITRATE (PF) 100 MCG/2ML IJ SOLN
INTRAMUSCULAR | Status: DC | PRN
  Administered 2021-09-17: 50 ug via INTRAVENOUS

## 2021-09-17 MED ORDER — OXYCODONE HCL 5 MG PO TABS: 5.0000 mg | ORAL_TABLET | ORAL | Status: DC | PRN

## 2021-09-17 MED ORDER — HYDROCODONE-ACETAMINOPHEN 10-325 MG PO TABS
1.0000 | ORAL_TABLET | Freq: Four times a day (QID) | ORAL | Status: DC | PRN
  Administered 2021-09-17: 1 via ORAL
  Filled 2021-09-17: qty 1

## 2021-09-17 MED ORDER — HEPARIN (PORCINE) IN NACL 1000-0.9 UT/500ML-% IV SOLN
INTRAVENOUS | Status: AC
  Filled 2021-09-17: qty 500

## 2021-09-17 MED ORDER — ACETAMINOPHEN 325 MG PO TABS: 650.0000 mg | ORAL_TABLET | Freq: Four times a day (QID) | ORAL | Status: DC | PRN

## 2021-09-17 MED ORDER — DAPAGLIFLOZIN PROPANEDIOL 10 MG PO TABS
10.0000 mg | ORAL_TABLET | Freq: Every day | ORAL | Status: DC
  Administered 2021-09-18: 10 mg via ORAL
  Filled 2021-09-17: qty 1

## 2021-09-17 MED ORDER — FUROSEMIDE 20 MG PO TABS
20.0000 mg | ORAL_TABLET | Freq: Two times a day (BID) | ORAL | Status: DC
  Administered 2021-09-17 – 2021-09-18 (×2): 20 mg via ORAL
  Filled 2021-09-17 (×2): qty 1

## 2021-09-17 MED ORDER — PANTOPRAZOLE SODIUM 40 MG PO TBEC
40.0000 mg | DELAYED_RELEASE_TABLET | Freq: Every evening | ORAL | Status: DC
  Administered 2021-09-17: 40 mg via ORAL
  Filled 2021-09-17: qty 1

## 2021-09-17 MED ORDER — ACETAMINOPHEN 650 MG RE SUPP: 650.0000 mg | Freq: Four times a day (QID) | RECTAL | Status: DC | PRN

## 2021-09-17 MED ORDER — SODIUM CHLORIDE 0.9 % IV SOLN: 250.0000 mL | INTRAVENOUS | Status: DC | PRN

## 2021-09-17 MED ORDER — MORPHINE SULFATE (PF) 2 MG/ML IV SOLN: 1.0000 mg | INTRAVENOUS | Status: DC | PRN

## 2021-09-17 MED ORDER — LIDOCAINE HCL 1 % IJ SOLN
INTRAMUSCULAR | Status: DC | PRN
  Administered 2021-09-17 (×2): 15 mL

## 2021-09-17 MED ORDER — IOHEXOL 350 MG/ML SOLN
INTRAVENOUS | Status: DC | PRN
  Administered 2021-09-17: 60 mL

## 2021-09-17 MED ORDER — PROTAMINE SULFATE 10 MG/ML IV SOLN
INTRAVENOUS | Status: DC | PRN
  Administered 2021-09-17: 10 mg via INTRAVENOUS
  Administered 2021-09-17: 50 mg via INTRAVENOUS
  Administered 2021-09-17: 40 mg via INTRAVENOUS
  Administered 2021-09-17: 50 mg via INTRAVENOUS

## 2021-09-17 MED ORDER — PROPOFOL 500 MG/50ML IV EMUL
INTRAVENOUS | Status: DC | PRN
  Administered 2021-09-17: 10 ug/kg/min via INTRAVENOUS

## 2021-09-17 MED ORDER — CLOPIDOGREL BISULFATE 75 MG PO TABS: 75.0000 mg | ORAL_TABLET | Freq: Every day | ORAL | Status: DC

## 2021-09-17 MED ORDER — CEFAZOLIN SODIUM-DEXTROSE 2-4 GM/100ML-% IV SOLN
2.0000 g | Freq: Three times a day (TID) | INTRAVENOUS | Status: AC
  Administered 2021-09-17 (×2): 2 g via INTRAVENOUS
  Filled 2021-09-17 (×2): qty 100

## 2021-09-17 MED ORDER — INSULIN ASPART 100 UNIT/ML IJ SOLN
0.0000 [IU] | INTRAMUSCULAR | Status: DC | PRN
  Administered 2021-09-17: 2 [IU] via SUBCUTANEOUS
  Filled 2021-09-17: qty 1

## 2021-09-17 MED ORDER — CHLORHEXIDINE GLUCONATE 0.12 % MT SOLN
15.0000 mL | Freq: Once | OROMUCOSAL | Status: DC
  Filled 2021-09-17: qty 15

## 2021-09-17 MED ORDER — HEPARIN (PORCINE) IN NACL 1000-0.9 UT/500ML-% IV SOLN
INTRAVENOUS | Status: DC | PRN
  Administered 2021-09-17 (×3): 500 mL

## 2021-09-17 MED ORDER — ROSUVASTATIN CALCIUM 20 MG PO TABS
20.0000 mg | ORAL_TABLET | Freq: Every evening | ORAL | Status: DC
  Administered 2021-09-17: 20 mg via ORAL
  Filled 2021-09-17: qty 1

## 2021-09-17 MED ORDER — SODIUM CHLORIDE 0.9% FLUSH
3.0000 mL | Freq: Two times a day (BID) | INTRAVENOUS | Status: DC
  Administered 2021-09-17: 3 mL via INTRAVENOUS

## 2021-09-17 MED ORDER — CHLORHEXIDINE GLUCONATE 0.12 % MT SOLN
15.0000 mL | Freq: Once | OROMUCOSAL | Status: AC
  Administered 2021-09-17: 15 mL via OROMUCOSAL
  Filled 2021-09-17 (×2): qty 15

## 2021-09-17 MED ORDER — TAMSULOSIN HCL 0.4 MG PO CAPS
0.4000 mg | ORAL_CAPSULE | Freq: Every day | ORAL | Status: DC
  Administered 2021-09-17: 0.4 mg via ORAL
  Filled 2021-09-17: qty 1

## 2021-09-17 MED ORDER — INSULIN ASPART 100 UNIT/ML IJ SOLN
0.0000 [IU] | Freq: Three times a day (TID) | INTRAMUSCULAR | Status: DC
  Administered 2021-09-17 (×2): 4 [IU] via SUBCUTANEOUS
  Administered 2021-09-18: 8 [IU] via SUBCUTANEOUS

## 2021-09-17 MED ORDER — NITROGLYCERIN IN D5W 200-5 MCG/ML-% IV SOLN: 0.0000 ug/min | INTRAVENOUS | Status: DC

## 2021-09-17 MED ORDER — ASPIRIN 81 MG PO TBEC
81.0000 mg | DELAYED_RELEASE_TABLET | Freq: Every day | ORAL | Status: DC
  Administered 2021-09-18: 81 mg via ORAL
  Filled 2021-09-17: qty 1

## 2021-09-17 MED ORDER — POTASSIUM CITRATE ER 10 MEQ (1080 MG) PO TBCR
10.0000 meq | EXTENDED_RELEASE_TABLET | Freq: Two times a day (BID) | ORAL | Status: DC
  Administered 2021-09-18: 10 meq via ORAL
  Filled 2021-09-17: qty 1

## 2021-09-17 MED ORDER — CHLORHEXIDINE GLUCONATE 4 % EX LIQD
60.0000 mL | Freq: Once | CUTANEOUS | Status: DC
  Filled 2021-09-17: qty 60

## 2021-09-17 MED ORDER — AMLODIPINE BESYLATE 5 MG PO TABS
5.0000 mg | ORAL_TABLET | Freq: Every day | ORAL | Status: DC
  Administered 2021-09-17 – 2021-09-18 (×2): 5 mg via ORAL
  Filled 2021-09-17 (×2): qty 1

## 2021-09-17 MED ORDER — ORAL CARE MOUTH RINSE: 15.0000 mL | Freq: Once | OROMUCOSAL | Status: AC

## 2021-09-17 MED ORDER — ALFUZOSIN HCL ER 10 MG PO TB24
10.0000 mg | ORAL_TABLET | Freq: Every day | ORAL | Status: DC
  Administered 2021-09-18: 10 mg via ORAL
  Filled 2021-09-17: qty 1

## 2021-09-17 MED ORDER — SODIUM CHLORIDE 0.9 % IV SOLN: INTRAVENOUS | Status: AC

## 2021-09-17 MED ORDER — SODIUM CHLORIDE 0.9% FLUSH: 3.0000 mL | INTRAVENOUS | Status: DC | PRN

## 2021-09-17 MED ORDER — ONDANSETRON HCL 4 MG/2ML IJ SOLN: 4.0000 mg | Freq: Four times a day (QID) | INTRAMUSCULAR | Status: DC | PRN

## 2021-09-17 MED ORDER — HEPARIN SODIUM (PORCINE) 1000 UNIT/ML IJ SOLN
INTRAMUSCULAR | Status: DC | PRN
  Administered 2021-09-17: 15000 [IU] via INTRAVENOUS

## 2021-09-17 MED ORDER — CLOPIDOGREL BISULFATE 75 MG PO TABS
75.0000 mg | ORAL_TABLET | Freq: Every day | ORAL | Status: DC
  Administered 2021-09-18: 75 mg via ORAL
  Filled 2021-09-17: qty 1

## 2021-09-17 MED ORDER — SODIUM CHLORIDE 0.9 % IV SOLN: INTRAVENOUS | Status: DC

## 2021-09-17 MED ORDER — FERROUS SULFATE 325 (65 FE) MG PO TABS
325.0000 mg | ORAL_TABLET | Freq: Every day | ORAL | Status: DC
  Administered 2021-09-18: 325 mg via ORAL
  Filled 2021-09-17: qty 1

## 2021-09-17 MED ORDER — LIDOCAINE HCL 1 % IJ SOLN
INTRAMUSCULAR | Status: AC
  Filled 2021-09-17: qty 20

## 2021-09-17 SURGICAL SUPPLY — 30 items
BAG SNAP BAND KOVER 36X36 (MISCELLANEOUS) ×2 IMPLANT
CABLE ADAPT PACING TEMP 12FT (ADAPTER) IMPLANT
CATH 23 ULTRA DELIVERY (CATHETERS) IMPLANT
CATH DIAG 6FR PIGTAIL ANGLED (CATHETERS) IMPLANT
CATH INFINITI 6F AL2 (CATHETERS) IMPLANT
CATH S G BIP PACING (CATHETERS) IMPLANT
CLOSURE MYNX CONTROL 6F/7F (Vascular Products) IMPLANT
CLOSURE PERCLOSE PROSTYLE (VASCULAR PRODUCTS) IMPLANT
CRIMPER (MISCELLANEOUS) IMPLANT
DEVICE INFLATION ATRION QL2530 (MISCELLANEOUS) IMPLANT
GLOVE ECLIPSE 7.0 STRL STRAW (GLOVE) ×1 IMPLANT
KIT HEART LEFT (KITS) ×1 IMPLANT
KIT MICROPUNCTURE NIT STIFF (SHEATH) IMPLANT
KIT SAPIAN 3 ULTRA RESILIA 23 (Valve) IMPLANT
PACK CARDIAC CATHETERIZATION (CUSTOM PROCEDURE TRAY) ×1 IMPLANT
SHEATH BRITE TIP 7FR 35CM (SHEATH) IMPLANT
SHEATH INTRODUCER SET 20-26 (SHEATH) IMPLANT
SHEATH PINNACLE 6F 10CM (SHEATH) IMPLANT
SHEATH PINNACLE 8F 10CM (SHEATH) IMPLANT
SHEATH PROBE COVER 6X72 (BAG) IMPLANT
SLEEVE REPOSITIONING LENGTH 30 (MISCELLANEOUS) IMPLANT
STOPCOCK MORSE 400PSI 3WAY (MISCELLANEOUS) ×2 IMPLANT
SYR MEDRAD MARK V 150ML (SYRINGE) IMPLANT
TRANSDUCER W/STOPCOCK (MISCELLANEOUS) ×2 IMPLANT
TUBING CONTRAST HIGH PRESS 48 (TUBING) IMPLANT
WIRE AMPLATZ SS-J .035X180CM (WIRE) IMPLANT
WIRE EMERALD 3MM-J .035X150CM (WIRE) IMPLANT
WIRE EMERALD 3MM-J .035X260CM (WIRE) IMPLANT
WIRE EMERALD ST .035X260CM (WIRE) ×1 IMPLANT
WIRE SAFARI SM CURVE 275 (WIRE) IMPLANT

## 2021-09-17 NOTE — Progress Notes (Signed)
Mobility Specialist: Progress Note   09/17/21 1712  Mobility  Activity Ambulated with assistance in hallway  Level of Assistance Minimal assist, patient does 75% or more  Assistive Device Other (Comment) (IV pole)  Distance Ambulated (ft) 240 ft  Activity Response Tolerated well  $Mobility charge 1 Mobility   Pre-Mobility: 63 HR, 97% SpO2 Post-Mobility: 65 HR, 125/47 (69) BP, 100%SpO2  Received pt in bed having no complaints and agreeable to mobility. MinA with bed mobility and contact guard during ambulation. Pt was asymptomatic throughout ambulation and returned to room w/o fault. Left in chair w/ call bell in reach and all needs met.  [facility] Naylea Wigington Mobility Specialist Mobility Specialist [address]: [phone]

## 2021-09-17 NOTE — Op Note (Signed)
Signed                                                                             [facility]   MULTIDISCIPLINARY HEART VALVE TEAM     TAVR OPERATIVE NOTE     Date of Procedure:                09/17/2021   Preoperative Diagnosis:      Severe Aortic Stenosis    Postoperative Diagnosis:    Same    Procedure:        Transcatheter Aortic Valve Replacement - Percutaneous Right Transfemoral Approach             Edwards Sapien 3 Ultra THV (size 23 mm, model # [id number], serial # [id number])              Co-Surgeons:                        Coralie Common MD and   [name]    Anesthesiologist:                  Carmelina Noun MD   Pre-operative Echo Findings: Severe aortic stenosis Normal left ventricular systolic function   Post-operative Echo Findings: No paravalvular leak Normal left ventricular systolic function     BRIEF CLINICAL NOTE AND INDICATIONS FOR SURGERY    DETAILS OF THE OPERATIVE PROCEDURE  This 76 year old gentleman has stage D, severe, symptomatic aortic stenosis with NYHA class II symptoms of exertional fatigue and shortness of breath consistent with chronic diastolic congestive heart failure.  I have personally reviewed his 2D echocardiogram, cardiac catheterization, and CTA studies.  His echo shows a severely calcified and thickened aortic valve with restricted leaflet mobility.  The mean gradient across the aortic valve is increased to 41.5 mmHg with a valve area of 0.61 cm consistent with severe aortic stenosis.  Left ventricular ejection fraction is normal with grade 2 diastolic dysfunction.  His catheterization showed a high-grade LAD stenosis that was treated with intracoronary lithotripsy and PCI/DES.  I agree that aortic valve replacement is indicated in this patient for relief of his symptoms and to prevent left ventricular dysfunction.  Given his age and comorbid risk factors I think that transcatheter aortic valve  replacement would be the best treatment for him.  His gated cardiac CTA shows anatomy suitable for TAVR using a SAPIEN 3 valve.  His abdominal and pelvic CTA shows adequate pelvic vascular anatomy to allow transfemoral insertion.   PREPARATION:     The patient was brought to the operating room on the above mentioned date and appropriate monitoring was established by the anesthesia team. The patient was placed in the supine position on the operating table.  Intravenous antibiotics were administered. The patient was monitored closely throughout the procedure under conscious sedation.   Baseline transthoracic echocardiogram was performed. The patient's abdomen and both groins were prepped and draped in a sterile manner. A time out procedure was performed.     PERIPHERAL ACCESS:     Using the modified Seldinger technique, femoral arterial and venous access was obtained with placement of 6 Fr sheaths on the left  side.  A pigtail diagnostic catheter was passed through the left arterial sheath under fluoroscopic guidance into the aortic root.  A temporary transvenous pacemaker catheter was passed through the left femoral venous sheath under fluoroscopic guidance into the right ventricle.  The pacemaker was tested to ensure stable lead placement and pacemaker capture. Aortic root angiography was performed in order to determine the optimal angiographic angle for valve deployment.     TRANSFEMORAL ACCESS:    Percutaneous transfemoral access and sheath placement was performed using ultrasound guidance.  The Right common femoral artery was cannulated using a micropuncture needle and appropriate location was verified using hand injection angiogram.  A pair of Abbott Perclose percutaneous closure devices were placed and a 6 French sheath replaced into the femoral artery.  The patient was heparinized systemically and ACT verified > 250 seconds.     A 14 Fr transfemoral E-sheath was introduced into the right  common femoral artery after progressively dilating over an Amplatz superstiff wire. An AL2 catheter was used to direct a J-tip exchange length wire across the native aortic valve into the left ventricle. This was exchanged out for a pigtail catheter and position was confirmed in the LV apex. Simultaneous LV and Ao pressures were recorded.  The pigtail catheter was exchanged for a Safari wire in the LV apex.    BALLOON AORTIC VALVULOPLASTY:    Not performed     TRANSCATHETER HEART VALVE DEPLOYMENT:    An Edwards Sapien 3 Ultra transcatheter heart valve (size 23 mm) was prepared and crimped per manufacturer's guidelines, and the proper orientation of the valve is confirmed on the [employer] delivery system. The valve was advanced through the introducer sheath using normal technique until in an appropriate position in the abdominal aorta beyond the sheath tip. The balloon was then retracted and using the fine-tuning wheel was centered on the valve. The valve was then advanced across the aortic arch using appropriate flexion of the catheter. The valve was carefully positioned across the aortic valve annulus. The Commander catheter was retracted using normal technique. Once final position of the valve has been confirmed by angiographic assessment, the valve is deployed during rapid ventricular pacing to maintain systolic blood pressure < 50 mmHg and pulse pressure < 10 mmHg. The balloon inflation is held for >3 seconds after reaching full deployment volume. Once the balloon has fully deflated the balloon is retracted into the ascending aorta and valve function is assessed using echocardiography. There is felt to be no paravalvular leak and no central aortic insufficiency.  The patient's hemodynamic recovery following valve deployment is good.  The deployment balloon and guidewire are both removed.      PROCEDURE COMPLETION:    The sheath was removed and femoral artery closure performed.  Protamine  was administered once femoral arterial repair was complete. The temporary pacemaker, pigtail catheter and femoral sheaths were removed with manual pressure used for venous hemostasis.  A Mynx femoral closure device was utilized following removal of the diagnostic sheath in the left femoral artery.   The patient tolerated the procedure well and is transported to the cath lab recovery area in stable condition. There were no immediate intraoperative complications. All sponge instrument and needle counts are verified correct at completion of the operation.    No blood products were administered during the operation.         Coralie Common, MD 09/17/2021 11:51 AM         Electronically signed by Coralie Common, MD  at 09/17/2021 11:58 AM

## 2021-09-17 NOTE — CV Procedure (Signed)
[facility]  TAVR OPERATIVE NOTE   Date of Procedure:  09/17/2021  Preoperative Diagnosis: Severe Aortic Stenosis   Postoperative Diagnosis: Same   Procedure:   Transcatheter Aortic Valve Replacement - Transfemoral Approach  Edwards Sapien 3 THV (size 23 mm, model # [id number], serial # [id number])   Co-Surgeons:  [name], MD and Gaye Pollack, MD/Paul Lavonna Monarch, MD.  Anesthesiologist:  Hodierne  Echocardiographer:  O'Neal  Pre-operative Echo Findings: Severe aortic stenosis Normal left ventricular systolic function  Post-operative Echo Findings: No paravalvular leak Normal left ventricular systolic function  BRIEF CLINICAL NOTE AND INDICATIONS FOR SURGERY  76 yo male with history of CAD, anxiety, prostate cancer, diabetes mellitus, atrial fibrillation, HTN, hyperlipidemia and severe aortic stenosis who is here today for TAVR. Recent progressive fatigue and dyspnea on exertion. He is known to have CAD and had a drug eluting stent placed in the distal RCA in 2015. Cardiac cath on 07/15/21 with new finding of total occlusion of the mid RCA with filling of the distal vessel from left to right collaterals. Severe mid LAD stenosis treated with a drug eluting stent. History of prostate cancer treated with radiation. Echo 06/19/21 with LVEF=60-65%, moderate LVH. Normal RV function. Mild to moderate mitral regurgitation. Moderate MAC. Severe aortic stenosis with mean gradient 41.5 mmHg, peak gradient 66.4 mmHg, AVA 0.54 cm2, DI 0.27, SVI 28.   During the course of the patient's preoperative work up they have been evaluated comprehensively by a multidisciplinary team of specialists coordinated through the [facility] in the [facility].  They have been demonstrated to suffer from symptomatic severe aortic stenosis as noted above. The patient has been counseled extensively as to the relative risks and benefits of all  options for the treatment of severe aortic stenosis including long term medical therapy, conventional surgery for aortic valve replacement, and transcatheter aortic valve replacement.  The patient has been independently evaluated by Dr. Cyndia Bent with CT surgery and they are felt to be at high risk for conventional surgical aortic valve replacement. The surgeon indicated the patient would be a poor candidate for conventional surgery. Based upon review of all of the patient's preoperative diagnostic tests they are felt to be candidate for transcatheter aortic valve replacement using the transfemoral approach as an alternative to high risk conventional surgery.    Following the decision to proceed with transcatheter aortic valve replacement, a discussion has been held regarding what types of management strategies would be attempted intraoperatively in the event of life-threatening complications, including whether or not the patient would be considered a candidate for the use of cardiopulmonary bypass and/or conversion to open sternotomy for attempted surgical intervention.  The patient has been advised of a variety of complications that might develop peculiar to this approach including but not limited to risks of death, stroke, paravalvular leak, aortic dissection or other major vascular complications, aortic annulus rupture, device embolization, cardiac rupture or perforation, acute myocardial infarction, arrhythmia, heart block or bradycardia requiring permanent pacemaker placement, congestive heart failure, respiratory failure, renal failure, pneumonia, infection, other late complications related to structural valve deterioration or migration, or other complications that might ultimately cause a temporary or permanent loss of functional independence or other long term morbidity.  The patient provides full informed consent for the procedure as described and all questions were answered preoperatively.    DETAILS  OF THE OPERATIVE PROCEDURE  PREPARATION:   The patient is brought to the operating room on the  above mentioned date and central monitoring was established by the anesthesia team including placement of a radial arterial line. The patient is placed in the supine position on the operating table.  Intravenous antibiotics are administered. Conscious sedation is used.   Baseline transthoracic echocardiogram was performed. The patient's chest, abdomen, both groins, and both lower extremities are prepared and draped in a sterile manner. A time out procedure is performed.   PERIPHERAL ACCESS:   Using the modified Seldinger technique, femoral arterial and venous access were obtained with placement of a 6 Fr sheath in the artery and a 7 Fr sheath in the vein on the left side using u/s guidance.  A pigtail diagnostic catheter was passed through the femoral arterial sheath under fluoroscopic guidance into the aortic root.  A temporary transvenous pacemaker catheter was passed through the femoral venous sheath under fluoroscopic guidance into the right ventricle.  The pacemaker was tested to ensure stable lead placement and pacemaker capture. Aortic root angiography was performed in order to determine the optimal angiographic angle for valve deployment.  TRANSFEMORAL ACCESS:  A micropuncture kit was used to gain access to the right femoral artery using u/s guidance. Position confirmed with angiography. Pre-closure with double ProGlide closure devices. The patient was heparinized systemically and ACT verified > 250 seconds.    A 14 Fr transfemoral E-sheath was introduced into the right femoral artery after progressively dilating over an Amplatz superstiff wire. An AL-2 catheter was used to direct a straight-tip exchange length wire across the native aortic valve into the left ventricle. This was exchanged out for a pigtail catheter and position was confirmed in the LV apex. Simultaneous LV and Ao pressures were  recorded.  The pigtail catheter was then exchanged for an Amplatz Extra-stiff wire in the LV apex.   TRANSCATHETER HEART VALVE DEPLOYMENT:  An Edwards Sapien 3 THV (size 23 mm) was prepared and crimped per manufacturer's guidelines, and the proper orientation of the valve is confirmed on the [employer] delivery system. The valve was advanced through the introducer sheath using normal technique until in an appropriate position in the abdominal aorta beyond the sheath tip. The balloon was then retracted and using the fine-tuning wheel was centered on the valve. The valve was then advanced across the aortic arch using appropriate flexion of the catheter. The valve was carefully positioned across the aortic valve annulus. The Commander catheter was retracted using normal technique. Once final position of the valve has been confirmed by angiographic assessment, the valve is deployed while temporarily holding ventilation and during rapid ventricular pacing to maintain systolic blood pressure < 50 mmHg and pulse pressure < 10 mmHg. The balloon inflation is held for >3 seconds after reaching full deployment volume. Once the balloon has fully deflated the balloon is retracted into the ascending aorta and valve function is assessed using TTE. There is felt to be no paravalvular leak and no central aortic insufficiency.  The patient's hemodynamic recovery following valve deployment is good.  The deployment balloon and guidewire are both removed. Echo demostrated acceptable post-procedural gradients, stable mitral valve function, and no AI.   PROCEDURE COMPLETION:  The sheath was then removed and closure devices were completed. Protamine was administered once femoral arterial repair was complete. The temporary pacemaker, pigtail catheters and femoral sheaths were removed with a Mynx closure device placed in the artery and manual pressure used for venous hemostasis.    The patient tolerated the procedure well and  is transported to the surgical intensive  care in stable condition. There were no immediate intraoperative complications. All sponge instrument and needle counts are verified correct at completion of the operation.   No blood products were administered during the operation.  The patient received a total of 60 mL of intravenous contrast during the procedure.  LVEDP: 20 mmHg  [name] MD 09/17/2021 11:50 AM

## 2021-09-17 NOTE — Progress Notes (Signed)
  Echocardiogram 2D Echocardiogram has been performed.  William [name] 09/17/2021, 11:32 AM

## 2021-09-17 NOTE — Interval H&P Note (Signed)
History and Physical Interval Note:  09/17/2021 8:09 AM  William [name]  has presented today for surgery, with the diagnosis of Severe Aortic Stenosis.  The various methods of treatment have been discussed with the patient and family. After consideration of risks, benefits and other options for treatment, the patient has consented to  Procedure(s): Transcatheter Aortic Valve Replacement, Transfemoral (N/A) INTRAOPERATIVE TRANSTHORACIC ECHOCARDIOGRAM (N/A) as a surgical intervention.  The patient's history has been reviewed, patient examined, no change in status, stable for surgery.  I have reviewed the patient's chart and labs.  Questions were answered to the patient's satisfaction.     Gaye Pollack

## 2021-09-17 NOTE — Anesthesia Preprocedure Evaluation (Signed)
Anesthesia Evaluation  Patient identified by MRN, date of birth, ID band Patient awake    Reviewed: Allergy & Precautions, NPO status , Patient's Chart, lab work & pertinent test results  Airway        Dental   Pulmonary former smoker,           Cardiovascular hypertension, + CAD and + Past MI       Neuro/Psych    GI/Hepatic GERD  ,  Endo/Other  diabetes, Type 2  Renal/GU Renal InsufficiencyRenal disease     Musculoskeletal   Abdominal   Peds  Hematology   Anesthesia Other Findings   Reproductive/Obstetrics                             Anesthesia Physical Anesthesia Plan Anesthesia Quick Evaluation

## 2021-09-17 NOTE — Anesthesia Procedure Notes (Signed)
Procedure Name: MAC Date/Time: 09/17/2021 10:15 AM  Performed by: Mariea Clonts, CRNAPre-anesthesia Checklist: Patient identified, Emergency Drugs available, Suction available, Patient being monitored and Timeout performed Oxygen Delivery Method: Simple face mask Preoxygenation: Pre-oxygenation with 100% oxygen

## 2021-09-17 NOTE — Progress Notes (Signed)
  [facility] VALVE TEAM  Patient doing well s/p TAVR. He is hemodynamically stable. Groin sites stable. ECG with no high grade block however evidence of new LBBB.  Arterial line has been discontinued and bed assigned on 4E. Plan for early ambulation after bedrest completed and hopeful discharge over the next 24-48 hours.    Kathyrn Drown NP-C Structural Heart Team  Pager: [phone] Phone: [phone]

## 2021-09-17 NOTE — Transfer of Care (Signed)
Immediate Anesthesia Transfer of Care Note  Patient: William [name]  Procedure(s) Performed: Transcatheter Aortic Valve Replacement, Transfemoral INTRAOPERATIVE TRANSTHORACIC ECHOCARDIOGRAM  Patient Location: Cath Lab  Anesthesia Type:MAC  Level of Consciousness: awake, alert  and oriented  Airway & Oxygen Therapy: Patient Spontanous Breathing and Patient connected to nasal cannula oxygen  Post-op Assessment: Report given to RN and Post -op Vital signs reviewed and stable  Post vital signs: Reviewed and stable  Last Vitals:  Vitals Value Taken Time  BP 125/52 09/17/21 1225  Temp 36.7 C 09/17/21 1206  Pulse 60 09/17/21 1228  Resp 15 09/17/21 1228  SpO2 96 % 09/17/21 1228  Vitals shown include unvalidated device data.  Last Pain:  Vitals:   09/17/21 1206  TempSrc: Temporal  PainSc: 0-No pain         Complications: There were no known notable events for this encounter.

## 2021-09-17 NOTE — Discharge Summary (Incomplete)
[place] VALVE TEAM  Discharge Summary    Patient ID: William [name] MRN: [id number]; DOB: 1945/04/01  Admit date: 09/17/2021 Discharge date: 09/18/2021  Primary Care Provider: [name], MD  Primary Cardiologist: [name], MD / Dr. Angelena Form & Dr. Randolm Idol (TAVR)  Discharge Diagnoses    Principal Problem:   S/P TAVR (transcatheter aortic valve replacement) Active Problems:   Hyperlipidemia   DM (diabetes mellitus) ([place])   HTN (hypertension)   Coronary artery disease   CKD stage 3 due to type 2 diabetes mellitus (HCC)   Severe aortic stenosis   NSTEMI (non-ST elevated myocardial infarction) ([place])   Allergies No Known Allergies  Diagnostic Studies/Procedures    TAVR OPERATIVE NOTE     Date of Procedure:                09/17/2021   Preoperative Diagnosis:      Severe Aortic Stenosis    Postoperative Diagnosis:    Same    Procedure:        Transcatheter Aortic Valve Replacement - Percutaneous Right Transfemoral Approach             Edwards Sapien 3 Ultra THV (size 23 mm, model # 9755RSL, serial # [id number])              Co-Surgeons:                        Coralie Common MD and   [name]    Anesthesiologist:                  Carmelina Noun MD   Pre-operative Echo Findings: Severe aortic stenosis Normal left ventricular systolic function   Post-operative Echo Findings: No paravalvular leak Normal left ventricular systolic function   _____________    Echo 09/18/21: completed but pending formal read at the time of discharge   History of Present Illness     William [name] is a 76 y.o. male with a history of CAD, anxiety, prostate cancer, diabetes mellitus, CKD stage 2, HTN, chart history of PAF but not recently, HLD, CAD s/p PCTA/DES x1 mLAD with intracoronary lithotripsy (07/15/21) and severe aortic stenosis who presented to [facility] on [facility]/17/2021 for planned TAVR.   Ms. Mendonsa has been followed by Dr.  Johnsie Cancel for his cardiology care. He has known CAD with DES placement to the distal RCA in 2015. Cardiac catheterization on 07/15/21 with new finding of total occlusion of the mid RCA with filling of the distal vessel from left to right collaterals, severe mid LAD stenosis treated with a DES/PCI x1 using ShockWave intracoronary lithotripsy. He was placed on ASA and Plavix x 6 months. Echocardiogram 06/19/21 showed a LVEF at 60-65%, moderate LVH, normal RV function, mild to moderate mitral regurgitation, moderate MAC, and severe aortic stenosis with mean gradient 41.5 mmHg, peak gradient 66.4 mmHg, AVA 0.54 cm2, DI 0.27, SVI 28.  He reported symptoms of progressive fatigue and dyspnea on exertion. Due to poor dentition, he underwent dental extractions 08/22/21. He was seen in the ER on 09/02/21 for feeling poorly with an elevated HR and troponin noted to be slightly elevated. It was felt safe to discharge him home with a heart monitor on.   He was evaluated by the multidisciplinary valve team and felt to have severe, symptomatic aortic stenosis and to be a suitable candidate for TAVR, which was set up for 09/17/21.     Hospital  Course     Severe AS: s/p successful TAVR with a 23 mm Edwards Sapien 3 THV via the TF approach on 09/17/21. Post operative echo completed but pending formal read. Groin sites are stable. ECG with sinus with new LBBB but no high grade heart block. Continue ASA and plavix. The patient has ambulated with CR without difficultly. Plan for discharge home with close follow up in the outpatient setting.   New LBBB: will place a Zio AT to rule out delayed HAVB. Mail previous order back as it should be almost complete.   CAD: recent with PCI/DES to the LAD with intracoronary lithotripsy prior to stent placement 07/15/21. Moderate mid Circumflex stenosis, moderate stenosis OM3, and large dominant RCA with total occlusion (CTO) of the mid RCA with distal RCA fills from left to right collaterals. Placed  on DAPT with ASA and Plavix x 6 months, then ASA monotherapy.   DMT2: treated with SSI while admitted. Resume home meds at discharge. Okay to resume Metformin after 48 hours after contrast dye exposure (9/15AM)   HTN: stable. Resume home meds.   Kidney stones: pre TAVR scans showed "a small stone of the left ureterovesicular junction measuring 4 mm, likely nonobstructive given lack of hydronephrosis. Recommend urologic consultation" This information was faxed to his urologist Dr. Alyson Ingles.   Thyroid nodule: pre TAVR scans showed "a right thyroid nodule measuring 1.1 cm. Recommend thyroid ultrasound for further evaluation." This will be discussed in the outpatient setting.   Consultants: None    The patient was seen and examined by Dr. Angelena Form who feels that he is stable and ready for discharge today, 09/18/21.  _____________  Discharge Vitals Blood pressure (!) 136/46, pulse 89, temperature 97.8 F (36.6 C), temperature source Oral, resp. rate (!) 21, height _0  (1.753 m), weight 102.6 kg, SpO2 94 %.  Filed Weights   09/17/21 0706 09/18/21 0441  Weight: 103 kg 102.6 kg    GEN: Well nourished, well developed, in no acute distress HEENT: normal Neck: no JVD or masses Cardiac: RRR; soft murmur. no rubs, or gallops,no edema  Respiratory:  clear to auscultation bilaterally, normal work of breathing GI: soft, nontender, nondistended, + BS MS: no deformity or atrophy Skin: warm and dry, no rash. Groin sites clear without hematoma. Ecchymosis noted bilaterally. Neuro:  Alert and Oriented x 3, Strength and sensation are intact Psych: euthymic mood, full affect   Labs & Radiologic Studies    CBC Recent Labs    09/17/21 1017 09/18/21 0409  WBC  --  10.2  HGB 13.6 11.5*  HCT 40.0 35.5*  MCV  --  91.3  PLT  --  433   Basic Metabolic Panel Recent Labs    09/17/21 1017 09/18/21 0409  NA 142 139  K 4.3 4.3  CL 107 108  CO2  --  21*  GLUCOSE 172* 181*  BUN 15 18   CREATININE 1.10 1.23  CALCIUM  --  9.2  MG  --  1.8   Liver Function Tests No results for input(s): "AST", "ALT", "ALKPHOS", "BILITOT", "PROT", "ALBUMIN" in the last 72 hours. No results for input(s): "LIPASE", "AMYLASE" in the last 72 hours. Cardiac Enzymes No results for input(s): "CKTOTAL", "CKMB", "CKMBINDEX", "TROPONINI" in the last 72 hours. BNP Invalid input(s): "POCBNP" D-Dimer No results for input(s): "DDIMER" in the last 72 hours. Hemoglobin A1C No results for input(s): "HGBA1C" in the last 72 hours. Fasting Lipid Panel No results for input(s): "CHOL", "HDL", "LDLCALC", "TRIG", "CHOLHDL", "LDLDIRECT" in  the last 72 hours. Thyroid Function Tests No results for input(s): "TSH", "T4TOTAL", "T3FREE", "THYROIDAB" in the last 72 hours.  Invalid input(s): "FREET3" _____________  ECHOCARDIOGRAM LIMITED  Result Date: 09/17/2021    ECHOCARDIOGRAM LIMITED REPORT   Patient Name:   William [name] Date of Exam: 09/17/2021 Medical Rec #:  [id number]     Height:       69.0 in Accession #:    [id number]    Weight:       227.0 lb Date of Birth:  1945/05/13     BSA:          2.180 m Patient Age:    69 years      BP:           140/72 mmHg Patient Gender: M             HR:           63 bpm. Exam Location:  Inpatient Procedure: Limited Echo, Cardiac Doppler and Color Doppler Indications:     I35.0 Nonrheumatic aortic (valve) stenosis  History:         Patient has prior history of Echocardiogram examinations, most                  recent 06/19/2021. CHF, CAD and Previous Myocardial Infarction,                  Aortic Valve Disease, Signs/Symptoms:Chest Pain; Risk                  Factors:Former Smoker. Severe aortic stenosis.                  Aortic Valve: 23 mm Sapien prosthetic, stented (TAVR) valve is                  present in the aortic position. Procedure Date: 09/17/2021.  Sonographer:     Roseanna Rainbow RDCS Referring Phys:  [name] Diagnosing Phys: Eleonore Chiquito MD IMPRESSIONS  1. Echo  guided TAVR. 23 mm S3. No regurgitation or paravalvular leak. Vmax 1.9 m/s, MG 8.0 mmHG, EOA 2.01 cm2, DI 0.48. No immediate complications. The aortic valve has been repaired/replaced. Aortic valve regurgitation is not visualized. There is a 23 mm Sapien prosthetic (TAVR) valve present in the aortic position. Procedure Date: 09/17/2021.  2. Left ventricular ejection fraction, by estimation, is 60 to 65%. The left ventricle has normal function. The left ventricle has no regional wall motion abnormalities.  3. Right ventricular systolic function is normal. The right ventricular size is normal.  4. The mitral valve is degenerative. Mild mitral valve regurgitation. No evidence of mitral stenosis. FINDINGS  Left Ventricle: Left ventricular ejection fraction, by estimation, is 60 to 65%. The left ventricle has normal function. The left ventricle has no regional wall motion abnormalities. Right Ventricle: The right ventricular size is normal. No increase in right ventricular wall thickness. Right ventricular systolic function is normal. Pericardium: There is no evidence of pericardial effusion. Mitral Valve: The mitral valve is degenerative in appearance. Mild to moderate mitral annular calcification. Mild mitral valve regurgitation. No evidence of mitral valve stenosis. Tricuspid Valve: The tricuspid valve is grossly normal. Tricuspid valve regurgitation is trivial. Aortic Valve: Echo guided TAVR. 23 mm S3. No regurgitation or paravalvular leak. Vmax 1.9 m/s, MG 8.0 mmHG, EOA 2.01 cm2, DI 0.48. No immediate complications. The aortic valve has been repaired/replaced. Aortic valve regurgitation is not visualized. Aortic valve mean gradient measures 8.0 mmHg.  Aortic valve peak gradient measures 13.7 mmHg. Aortic valve area, by VTI measures 2.01 cm. There is a 23 mm Sapien prosthetic, stented (TAVR) valve present in the aortic position. Procedure Date: 09/17/2021. Aorta: The aortic root and ascending aorta are structurally  normal, with no evidence of dilitation. LEFT VENTRICLE PLAX 2D LVOT diam:     2.30 cm LV SV:         92 LV SV Index:   42 LVOT Area:     4.15 cm  LV Volumes (MOD) LV vol d, MOD A2C: 105.0 ml LV vol d, MOD A4C: 100.0 ml LV vol s, MOD A2C: 29.3 ml LV vol s, MOD A4C: 39.1 ml LV SV MOD A2C:     75.8 ml LV SV MOD A4C:     100.0 ml LV SV MOD BP:      71.0 ml AORTIC VALVE AV Area (Vmax):    2.52 cm AV Area (Vmean):   1.19 cm AV Area (VTI):     2.01 cm AV Vmax:           185.00 cm/s AV Vmean:          240.500 cm/s AV VTI:            0.460 m AV Peak Grad:      13.7 mmHg AV Mean Grad:      8.0 mmHg LVOT Vmax:         112.00 cm/s LVOT Vmean:        69.150 cm/s LVOT VTI:          0.222 m LVOT/AV VTI ratio: 0.48  SHUNTS Systemic VTI:  0.22 m Systemic Diam: 2.30 cm Eleonore Chiquito MD Electronically signed by Eleonore Chiquito MD Signature Date/Time: 09/17/2021/12:20:43 PM    Final    Structural Heart Procedure  Result Date: 09/17/2021 See surgical note for result.  DG Chest 2 View per protocol  Result Date: 09/15/2021 CLINICAL DATA:  Preoperative chest radiograph prior to TAVR EXAM: CHEST - 2 VIEW COMPARISON:  09/02/2021 FINDINGS: The cardiomediastinal silhouette is unremarkable. Monitor device overlying the LEFT chest is noted. There is no evidence of focal airspace disease, pulmonary edema, suspicious pulmonary nodule/mass, pleural effusion, or pneumothorax. No acute bony abnormalities are identified. IMPRESSION: No active cardiopulmonary disease. Electronically Signed   By: Margarette Canada M.D.   On: 09/15/2021 09:10   DG Chest Port 1 View  Result Date: 09/02/2021 CLINICAL DATA:  Chest discomfort. EXAM: PORTABLE CHEST 1 VIEW COMPARISON:  CT 07/26/2021 FINDINGS: Normal cardiomediastinal contours. Decreased lung volumes. No pleural effusion or edema. No airspace opacities. The visualized osseous structures appear intact. IMPRESSION: 1. Low lung volumes. 2. No acute findings. Electronically Signed   By: Kerby Moors M.D.    On: 09/02/2021 07:19    Disposition   Pt is being discharged home today in good condition.  Follow-up Plans & Appointments     Follow-up Information     Eileen Stanford, PA-C Follow up on 09/25/2021.   Specialties: Cardiology, Radiology Why: @ 3:30pm. Please arrive by 3:15pm. Contact information: [place] [address] [phone]                  Discharge Medications   Allergies as of 09/18/2021   No Known Allergies      Medication List     TAKE these medications    acarbose 100 MG tablet Commonly known as: PRECOSE Take 100 mg by mouth 3 (three) times daily  with meals.   Accu-Chek Guide test strip Generic drug: glucose blood   Accu-Chek Softclix Lancets lancets 1 each 3 (three) times daily.   alendronate 70 MG tablet Commonly known as: FOSAMAX Take 1 tablet (70 mg total) by mouth every 7 (seven) days. Take with a full glass of water on an empty stomach.   alfuzosin 10 MG 24 hr tablet Commonly known as: UROXATRAL Take 1 tablet (10 mg total) by mouth daily with breakfast.   amLODipine 5 MG tablet Commonly known as: NORVASC TAKE 1 TABLET(5 MG) BY MOUTH DAILY   aspirin EC 81 MG tablet Take 81 mg by mouth daily.   Blue-Emu Hemp 10 % cream Generic drug: trolamine salicylate Apply 1 application topically as needed for muscle pain.   clopidogrel 75 MG tablet Commonly known as: Plavix Take 1 tablet (75 mg total) by mouth daily.   CoQ10 100 MG Caps Take 100 mg by mouth daily.   cyanocobalamin 1000 MCG tablet Commonly known as: VITAMIN B12 Take 1,000 mcg by mouth daily.   Farxiga 10 MG Tabs tablet Generic drug: dapagliflozin propanediol Take 10 mg by mouth daily.   ferrous sulfate 325 (65 FE) MG EC tablet Take 1 tablet (325 mg total) by mouth daily with breakfast.   furosemide 20 MG tablet Commonly known as: LASIX Take 20 mg by mouth 2 (two) times daily.   glipiZIDE 10 MG tablet Commonly known as:  GLUCOTROL Take 10 mg by mouth 2 (two) times daily.   HYDROcodone-acetaminophen 10-325 MG tablet Commonly known as: NORCO Take 1 tablet by mouth every 6 (six) hours as needed for moderate pain.   Levemir FlexTouch 100 UNIT/ML FlexPen Generic drug: insulin detemir Inject 90 Units into the skin at bedtime.   lisinopril 20 MG tablet Commonly known as: ZESTRIL Take 1 tablet (20 mg total) by mouth daily.   Magnesium 400 MG Tabs Take 400 mg by mouth daily.   metFORMIN 500 MG 24 hr tablet Commonly known as: GLUCOPHAGE-XR Take 500 mg by mouth every evening. Take with 1000 Metformin   metFORMIN 1000 MG tablet Commonly known as: GLUCOPHAGE Take 1,000 mg by mouth 2 (two) times daily.   metoprolol tartrate 25 MG tablet Commonly known as: LOPRESSOR Take 1 tablet (25 mg total) by mouth daily.   Nac 600 600 MG Caps Generic drug: Acetylcysteine Take 600 mg by mouth daily.   nitroGLYCERIN 0.4 MG SL tablet Commonly known as: NITROSTAT Place 1 tablet (0.4 mg total) under the tongue every 5 (five) minutes x 3 doses as needed for chest pain (if no relief after 2nd dose, proceed to the ED for an evalution or call 911).   pantoprazole 40 MG tablet Commonly known as: PROTONIX Take 40 mg by mouth every evening.   potassium citrate 5 MEQ (540 MG) SR tablet Commonly known as: UROCIT-K Take 5 mEq by mouth 3 (three) times daily with meals.   Quercetin 500 MG Caps Take 1,000 mg by mouth daily.   rosuvastatin 20 MG tablet Commonly known as: CRESTOR Take 20 mg by mouth every evening.   tamsulosin 0.4 MG Caps capsule Commonly known as: FLOMAX Take 0.4 mg by mouth daily after supper.   vitamin C 1000 MG tablet Take 1,000 mg by mouth every morning.   Vitamin D3 125 MCG (5000 UT) Tabs Take 5,000 Units by mouth daily.   Vitamin K2 100 MCG Tabs Take 100 mcg by mouth daily.   zinc gluconate 50 MG tablet Take 50 mg by mouth daily.  Outstanding Labs/Studies   none  Duration  of Discharge Encounter   Greater than 30 minutes including physician time.  Signed, Angelena Form, PA-C 09/18/2021, 10:14 AM [phone]   I have personally seen and examined this patient. I agree with the assessment and plan as outlined above.  He is doing well POD 1 post TAVR. Groins stable. BP stable. New LBBB. 14 day Zio Discharge home today  [name], MD, [facility] 09/18/2021 11:18 AM

## 2021-09-17 NOTE — Anesthesia Procedure Notes (Signed)
Arterial Line Insertion Start/End9/12/2021 9:21 AM, 09/17/2021 9:21 AM Performed by: CRNA  Patient location: Pre-op. Preanesthetic checklist: patient identified, IV checked, site marked, risks and benefits discussed, surgical consent, monitors and equipment checked, pre-op evaluation, timeout performed and anesthesia consent Lidocaine 1% used for infiltration Right, radial was placed Catheter size: 20 G Hand hygiene performed  and maximum sterile barriers used   Attempts: 2 Procedure performed without using ultrasound guided technique. Following insertion, dressing applied and Biopatch. Post procedure assessment: normal  Patient tolerated the procedure well with no immediate complications.

## 2021-09-18 ENCOUNTER — Observation Stay (HOSPITAL_COMMUNITY): Payer: Medicare HMO

## 2021-09-18 ENCOUNTER — Inpatient Hospital Stay (HOSPITAL_BASED_OUTPATIENT_CLINIC_OR_DEPARTMENT_OTHER)
Admit: 2021-09-18 | Discharge: 2021-09-18 | Disposition: A | Discharge status: Home / Self Care | Payer: Medicare HMO | Attending: Physician Assistant | Admitting: Physician Assistant

## 2021-09-18 ENCOUNTER — Ambulatory Visit: Payer: Medicare HMO | Admitting: Physician Assistant

## 2021-09-18 ENCOUNTER — Encounter (HOSPITAL_COMMUNITY): Payer: Self-pay | Admitting: Cardiovascular Disease

## 2021-09-18 DIAGNOSIS — I350 Nonrheumatic aortic (valve) stenosis: Secondary | ICD-10-CM | POA: Diagnosis not present

## 2021-09-18 DIAGNOSIS — I447 Left bundle-branch block, unspecified: Secondary | ICD-10-CM

## 2021-09-18 DIAGNOSIS — Z952 Presence of prosthetic heart valve: Secondary | ICD-10-CM

## 2021-09-18 LAB — BASIC METABOLIC PANEL
Anion gap: 10 (ref 5–15)
BUN: 18 mg/dL (ref 8–23)
CO2: 21 mmol/L — ABNORMAL LOW (ref 22–32)
Calcium: 9.2 mg/dL (ref 8.9–10.3)
Chloride: 108 mmol/L (ref 98–111)
Creatinine, Ser: 1.23 mg/dL (ref 0.61–1.24)
GFR, Estimated: >60 mL/min (ref >60)
Glucose, Bld: 181 mg/dL — ABNORMAL HIGH (ref 70–99)
Potassium: 4.3 mmol/L (ref 3.5–5.1)
Sodium: 139 mmol/L (ref 135–145)

## 2021-09-18 LAB — CBC
HCT: 35.5 % — ABNORMAL LOW (ref 39.0–52.0)
Hemoglobin: 11.5 g/dL — ABNORMAL LOW (ref 13.0–17.0)
MCH: 29.6 pg (ref 26.0–34.0)
MCHC: 32.4 g/dL (ref 30.0–36.0)
MCV: 91.3 fL (ref 80.0–100.0)
Platelets: 165 10*3/uL (ref 150–400)
RBC: 3.89 MIL/uL — ABNORMAL LOW (ref 4.22–5.81)
RDW: 15.9 % — ABNORMAL HIGH (ref 11.5–15.5)
WBC: 10.2 10*3/uL (ref 4.0–10.5)
nRBC: 0 % (ref 0.0–0.2)

## 2021-09-18 LAB — MAGNESIUM: Magnesium: 1.8 mg/dL (ref 1.7–2.4)

## 2021-09-18 LAB — GLUCOSE, CAPILLARY: Glucose-Capillary: 206 mg/dL — ABNORMAL HIGH (ref 70–99)

## 2021-09-18 NOTE — TOC Transition Note (Signed)
Transition of Care (TOC) - CM/SW Discharge Note Marvetta Gibbons RN, BSN Transitions of Care Unit 4E- RN Case Manager See Treatment Team for direct phone #    Patient Details  Name: William [name] MRN: [id number] Date of Birth: [birth year]  Transition of Care [facility]) CM/SW Contact:  Dawayne Patricia, RN Phone Number: 09/18/2021, 10:49 AM   Clinical Narrative:    Pt s/p TAVR, stable for transition home today.  Transition of Care Department [facility]) has reviewed patient and no TOC needs have been identified at this time.   Final next level of care: Home/Self Care Barriers to Discharge: No Barriers Identified   Patient Goals and CMS Choice     Choice offered to / list presented to : NA  Discharge Placement               Home        Discharge Plan and Services     Post Acute Care Choice: NA          DME Arranged: N/A DME Agency: NA       HH Arranged: NA HH Agency: NA        Social Determinants of Health (SDOH) Interventions     Readmission Risk Interventions    09/18/2021   10:49 AM  Readmission Risk Prevention Plan  Post Dischage Appt Complete  Medication Screening Complete  Transportation Screening Complete

## 2021-09-18 NOTE — Anesthesia Postprocedure Evaluation (Signed)
Anesthesia Post Note  Patient: William [name]  Procedure(s) Performed: Transcatheter Aortic Valve Replacement, Transfemoral INTRAOPERATIVE TRANSTHORACIC ECHOCARDIOGRAM     Patient location during evaluation: Cath Lab Anesthesia Type: MAC Level of consciousness: awake and alert Pain management: pain level controlled Vital Signs Assessment: post-procedure vital signs reviewed and stable Respiratory status: spontaneous breathing, nonlabored ventilation, respiratory function stable and patient connected to nasal cannula oxygen Cardiovascular status: stable and blood pressure returned to baseline Postop Assessment: no apparent nausea or vomiting Anesthetic complications: no   There were no known notable events for this encounter.  Last Vitals:  Vitals:   09/18/21 0438 09/18/21 0757  BP: (!) 119/50 (!) 136/46  Pulse: 82 89  Resp: 15 (!) 21  Temp: 36.7 C 36.6 C  SpO2: 90% 94%    Last Pain:  Vitals:   09/18/21 0757  TempSrc: Oral  PainSc: 0-No pain                 Anaka Beazer S

## 2021-09-18 NOTE — Progress Notes (Signed)
  Echocardiogram 2D Echocardiogram has been performed.  William [name] 09/18/2021, 8:38 AM

## 2021-09-18 NOTE — Progress Notes (Signed)
Mobility Specialist Progress Note:   09/18/21 0847  Mobility  Activity Ambulated with assistance in hallway  Level of Assistance Contact guard assist, steadying assist  Assistive Device None  Distance Ambulated (ft) 220 ft  Activity Response Tolerated well  $Mobility charge 1 Mobility   Pt received in bed willing to participate in mobility. Complaints of 4/10 pain at L groin site. Left in chair with call bell in reach and all needs met.   [facility] Surveyor, mining Chat only

## 2021-09-18 NOTE — Discharge Instructions (Signed)

## 2021-09-18 NOTE — Progress Notes (Signed)
Discharge:   Summary reviewed with patient.  Medications reviewed with no new changes noted. IV access discontinued x 2.  CCMD notified.   Assisted to private vehicle by staff

## 2021-09-18 NOTE — Progress Notes (Signed)
Patient previously walked with mobility team, patient educated on exercise guidelines, restrictions, nutrition, incision site care; will refer to cardiac rehab phase 2 at [facility]. [id number] Albertine Grates RN 9/13/202311:04 AM

## 2021-09-19 ENCOUNTER — Telehealth: Payer: Self-pay | Admitting: Physician Assistant

## 2021-09-19 DIAGNOSIS — Z952 Presence of prosthetic heart valve: Secondary | ICD-10-CM | POA: Diagnosis not present

## 2021-09-19 DIAGNOSIS — I447 Left bundle-branch block, unspecified: Secondary | ICD-10-CM | POA: Diagnosis not present

## 2021-09-19 LAB — ECHOCARDIOGRAM COMPLETE
AR max vel: 2.23 cm2
AV Area VTI: 1.94 cm2
AV Area mean vel: 2.12 cm2
AV Mean grad: 17 mmHg
AV Peak grad: 27.3 mmHg
Ao pk vel: 2.61 m/s
Area-P 1/2: 3.1 cm2
Calc EF: 63 %
Height: 69 in
MV VTI: 2.28 cm2
Radius: 0.4 cm
S' Lateral: 2.6 cm
Single Plane A2C EF: 62.1 %
Single Plane A4C EF: 63.2 %
Weight: 3619.07 oz

## 2021-09-19 NOTE — Telephone Encounter (Signed)
  [place] VALVE TEAM   Patient contacted regarding discharge from [facility] on 9/13  Patient understands to follow up with a structural heart APP on 9/20 at [place].  Patient understands discharge instructions? yes Patient understands medications and regimen? yes Patient understands to bring all medications to this visit? Yes  At home with his granddaughter, Anderson Malta. She says he is very fatigued and slurring his word. Has some tenderness in left groin. No aphasia, one sided weakness or facial droop. We went over ER precautions. She will call us back tomorrow if no improvement.   Angelena Form PA-C  [facility]

## 2021-09-22 ENCOUNTER — Other Ambulatory Visit: Payer: Self-pay

## 2021-09-22 ENCOUNTER — Encounter (HOSPITAL_COMMUNITY): Payer: Self-pay | Admitting: *Deleted

## 2021-09-22 ENCOUNTER — Emergency Department (HOSPITAL_COMMUNITY): Payer: Medicare HMO

## 2021-09-22 ENCOUNTER — Observation Stay (HOSPITAL_COMMUNITY)
Admission: EM | Admit: 2021-09-22 | Discharge: 2021-09-23 | Disposition: A | Discharge status: Home / Self Care | Payer: Medicare HMO | Attending: Internal Medicine | Admitting: Internal Medicine

## 2021-09-22 ENCOUNTER — Other Ambulatory Visit (HOSPITAL_COMMUNITY): Payer: Medicare HMO

## 2021-09-22 DIAGNOSIS — R778 Other specified abnormalities of plasma proteins: Secondary | ICD-10-CM | POA: Insufficient documentation

## 2021-09-22 DIAGNOSIS — E041 Nontoxic single thyroid nodule: Secondary | ICD-10-CM | POA: Diagnosis not present

## 2021-09-22 DIAGNOSIS — Z87891 Personal history of nicotine dependence: Secondary | ICD-10-CM | POA: Insufficient documentation

## 2021-09-22 DIAGNOSIS — Z79899 Other long term (current) drug therapy: Secondary | ICD-10-CM | POA: Diagnosis not present

## 2021-09-22 DIAGNOSIS — N183 Chronic kidney disease, stage 3 unspecified: Secondary | ICD-10-CM | POA: Diagnosis not present

## 2021-09-22 DIAGNOSIS — I447 Left bundle-branch block, unspecified: Secondary | ICD-10-CM | POA: Diagnosis not present

## 2021-09-22 DIAGNOSIS — I129 Hypertensive chronic kidney disease with stage 1 through stage 4 chronic kidney disease, or unspecified chronic kidney disease: Secondary | ICD-10-CM | POA: Diagnosis not present

## 2021-09-22 DIAGNOSIS — I499 Cardiac arrhythmia, unspecified: Secondary | ICD-10-CM | POA: Diagnosis not present

## 2021-09-22 DIAGNOSIS — Z800 Family history of malignant neoplasm of digestive organs: Secondary | ICD-10-CM | POA: Diagnosis not present

## 2021-09-22 DIAGNOSIS — R739 Hyperglycemia, unspecified: Secondary | ICD-10-CM | POA: Diagnosis not present

## 2021-09-22 DIAGNOSIS — E1122 Type 2 diabetes mellitus with diabetic chronic kidney disease: Secondary | ICD-10-CM | POA: Insufficient documentation

## 2021-09-22 DIAGNOSIS — N182 Chronic kidney disease, stage 2 (mild): Secondary | ICD-10-CM | POA: Diagnosis not present

## 2021-09-22 DIAGNOSIS — Z8546 Personal history of malignant neoplasm of prostate: Secondary | ICD-10-CM | POA: Diagnosis not present

## 2021-09-22 DIAGNOSIS — R7989 Other specified abnormal findings of blood chemistry: Secondary | ICD-10-CM | POA: Diagnosis not present

## 2021-09-22 DIAGNOSIS — I4891 Unspecified atrial fibrillation: Secondary | ICD-10-CM | POA: Diagnosis not present

## 2021-09-22 DIAGNOSIS — I10 Essential (primary) hypertension: Secondary | ICD-10-CM | POA: Diagnosis present

## 2021-09-22 DIAGNOSIS — Z7984 Long term (current) use of oral hypoglycemic drugs: Secondary | ICD-10-CM | POA: Diagnosis not present

## 2021-09-22 DIAGNOSIS — Z954 Presence of other heart-valve replacement: Secondary | ICD-10-CM | POA: Diagnosis not present

## 2021-09-22 DIAGNOSIS — Z9861 Coronary angioplasty status: Secondary | ICD-10-CM | POA: Diagnosis not present

## 2021-09-22 DIAGNOSIS — Z7901 Long term (current) use of anticoagulants: Secondary | ICD-10-CM | POA: Diagnosis not present

## 2021-09-22 DIAGNOSIS — E119 Type 2 diabetes mellitus without complications: Secondary | ICD-10-CM

## 2021-09-22 DIAGNOSIS — R2243 Localized swelling, mass and lump, lower limb, bilateral: Secondary | ICD-10-CM | POA: Diagnosis not present

## 2021-09-22 DIAGNOSIS — R0789 Other chest pain: Secondary | ICD-10-CM | POA: Diagnosis not present

## 2021-09-22 DIAGNOSIS — I350 Nonrheumatic aortic (valve) stenosis: Secondary | ICD-10-CM | POA: Diagnosis not present

## 2021-09-22 DIAGNOSIS — R079 Chest pain, unspecified: Secondary | ICD-10-CM | POA: Diagnosis not present

## 2021-09-22 DIAGNOSIS — Z952 Presence of prosthetic heart valve: Secondary | ICD-10-CM

## 2021-09-22 DIAGNOSIS — I2510 Atherosclerotic heart disease of native coronary artery without angina pectoris: Secondary | ICD-10-CM

## 2021-09-22 LAB — BASIC METABOLIC PANEL
Anion gap: 9 (ref 5–15)
BUN: 34 mg/dL — ABNORMAL HIGH (ref 8–23)
CO2: 22 mmol/L (ref 22–32)
Calcium: 10.3 mg/dL (ref 8.9–10.3)
Chloride: 110 mmol/L (ref 98–111)
Creatinine, Ser: 1.41 mg/dL — ABNORMAL HIGH (ref 0.61–1.24)
GFR, Estimated: 52 mL/min — ABNORMAL LOW (ref >60)
Glucose, Bld: 243 mg/dL — ABNORMAL HIGH (ref 70–99)
Potassium: 4.3 mmol/L (ref 3.5–5.1)
Sodium: 141 mmol/L (ref 135–145)

## 2021-09-22 LAB — CBC
HCT: 31.7 % — ABNORMAL LOW (ref 39.0–52.0)
Hemoglobin: 10.3 g/dL — ABNORMAL LOW (ref 13.0–17.0)
MCH: 29.9 pg (ref 26.0–34.0)
MCHC: 32.5 g/dL (ref 30.0–36.0)
MCV: 92.2 fL (ref 80.0–100.0)
Platelets: 145 10*3/uL — ABNORMAL LOW (ref 150–400)
RBC: 3.44 MIL/uL — ABNORMAL LOW (ref 4.22–5.81)
RDW: 16.1 % — ABNORMAL HIGH (ref 11.5–15.5)
WBC: 9.9 10*3/uL (ref 4.0–10.5)
nRBC: 0 % (ref 0.0–0.2)

## 2021-09-22 LAB — CBG MONITORING, ED
Glucose-Capillary: 170 mg/dL — ABNORMAL HIGH (ref 70–99)
Glucose-Capillary: 174 mg/dL — ABNORMAL HIGH (ref 70–99)
Glucose-Capillary: 180 mg/dL — ABNORMAL HIGH (ref 70–99)

## 2021-09-22 LAB — MAGNESIUM
Magnesium: 1.5 mg/dL — ABNORMAL LOW (ref 1.7–2.4)
Magnesium: 1.7 mg/dL (ref 1.7–2.4)

## 2021-09-22 LAB — TROPONIN I (HIGH SENSITIVITY)
Troponin I (High Sensitivity): 110 ng/L (ref <18)
Troponin I (High Sensitivity): 792 ng/L (ref <18)

## 2021-09-22 LAB — HEPARIN LEVEL (UNFRACTIONATED): Heparin Unfractionated: 0.33 IU/mL (ref 0.30–0.70)

## 2021-09-22 MED ORDER — ZINC SULFATE 220 (50 ZN) MG PO CAPS
220.0000 mg | ORAL_CAPSULE | Freq: Every day | ORAL | Status: DC
  Administered 2021-09-22 – 2021-09-23 (×2): 220 mg via ORAL
  Filled 2021-09-22 (×2): qty 1

## 2021-09-22 MED ORDER — CLOPIDOGREL BISULFATE 75 MG PO TABS
75.0000 mg | ORAL_TABLET | Freq: Every day | ORAL | Status: DC
  Administered 2021-09-22 – 2021-09-23 (×2): 75 mg via ORAL
  Filled 2021-09-22 (×2): qty 1

## 2021-09-22 MED ORDER — CHOLECALCIFEROL 10 MCG (400 UNIT) PO TABS
400.0000 [IU] | ORAL_TABLET | Freq: Every day | ORAL | Status: DC
  Administered 2021-09-22 – 2021-09-23 (×2): 400 [IU] via ORAL
  Filled 2021-09-22 (×2): qty 1

## 2021-09-22 MED ORDER — TAMSULOSIN HCL 0.4 MG PO CAPS
0.4000 mg | ORAL_CAPSULE | Freq: Every day | ORAL | Status: DC
  Administered 2021-09-22: 0.4 mg via ORAL
  Filled 2021-09-22: qty 1

## 2021-09-22 MED ORDER — ACETAMINOPHEN 325 MG PO TABS
650.0000 mg | ORAL_TABLET | Freq: Once | ORAL | Status: AC
  Administered 2021-09-22: 650 mg via ORAL
  Filled 2021-09-22: qty 2

## 2021-09-22 MED ORDER — HEPARIN (PORCINE) 25000 UT/250ML-% IV SOLN
1350.0000 [IU]/h | INTRAVENOUS | Status: DC
  Administered 2021-09-22: 1300 [IU]/h via INTRAVENOUS
  Filled 2021-09-22 (×2): qty 250

## 2021-09-22 MED ORDER — AMLODIPINE BESYLATE 5 MG PO TABS
5.0000 mg | ORAL_TABLET | Freq: Every day | ORAL | Status: DC
  Administered 2021-09-22 – 2021-09-23 (×2): 5 mg via ORAL
  Filled 2021-09-22 (×2): qty 1

## 2021-09-22 MED ORDER — COQ10 100 MG PO CAPS: 100.0000 mg | ORAL_CAPSULE | Freq: Every day | ORAL | Status: DC

## 2021-09-22 MED ORDER — FERROUS SULFATE 325 (65 FE) MG PO TABS
325.0000 mg | ORAL_TABLET | Freq: Every day | ORAL | Status: DC
  Administered 2021-09-22 – 2021-09-23 (×2): 325 mg via ORAL
  Filled 2021-09-22 (×3): qty 1

## 2021-09-22 MED ORDER — PANTOPRAZOLE SODIUM 40 MG PO TBEC
40.0000 mg | DELAYED_RELEASE_TABLET | Freq: Every evening | ORAL | Status: DC
  Administered 2021-09-22: 40 mg via ORAL
  Filled 2021-09-22: qty 1

## 2021-09-22 MED ORDER — ACETYLCYSTEINE 600 MG PO CAPS: 600.0000 mg | ORAL_CAPSULE | Freq: Every day | ORAL | Status: DC

## 2021-09-22 MED ORDER — VITAMIN B-12 1000 MCG PO TABS
1000.0000 ug | ORAL_TABLET | Freq: Every day | ORAL | Status: DC
  Administered 2021-09-22 – 2021-09-23 (×2): 1000 ug via ORAL
  Filled 2021-09-22 (×2): qty 1

## 2021-09-22 MED ORDER — INSULIN ASPART 100 UNIT/ML IJ SOLN: 0.0000 [IU] | Freq: Every day | INTRAMUSCULAR | Status: DC

## 2021-09-22 MED ORDER — MAGNESIUM SULFATE 2 GM/50ML IV SOLN
2.0000 g | Freq: Once | INTRAVENOUS | Status: AC
  Administered 2021-09-22: 2 g via INTRAVENOUS
  Filled 2021-09-22: qty 50

## 2021-09-22 MED ORDER — HEPARIN BOLUS VIA INFUSION
4000.0000 [IU] | Freq: Once | INTRAVENOUS | Status: AC
  Administered 2021-09-22: 4000 [IU] via INTRAVENOUS
  Filled 2021-09-22: qty 4000

## 2021-09-22 MED ORDER — ASPIRIN 81 MG PO TBEC
81.0000 mg | DELAYED_RELEASE_TABLET | Freq: Every day | ORAL | Status: DC
  Administered 2021-09-22 – 2021-09-23 (×2): 81 mg via ORAL
  Filled 2021-09-22 (×2): qty 1

## 2021-09-22 MED ORDER — MAGNESIUM CHLORIDE 64 MG PO TBEC
64.0000 mg | DELAYED_RELEASE_TABLET | Freq: Every day | ORAL | Status: DC
  Administered 2021-09-22 – 2021-09-23 (×2): 64 mg via ORAL
  Filled 2021-09-22 (×2): qty 1

## 2021-09-22 MED ORDER — ROSUVASTATIN CALCIUM 20 MG PO TABS
20.0000 mg | ORAL_TABLET | Freq: Every evening | ORAL | Status: DC
  Administered 2021-09-22: 20 mg via ORAL
  Filled 2021-09-22: qty 1

## 2021-09-22 MED ORDER — VITAMIN C 500 MG PO TABS
1000.0000 mg | ORAL_TABLET | Freq: Every morning | ORAL | Status: DC
  Administered 2021-09-22 – 2021-09-23 (×2): 1000 mg via ORAL
  Filled 2021-09-22 (×2): qty 2

## 2021-09-22 MED ORDER — INSULIN ASPART 100 UNIT/ML IJ SOLN
2.0000 [IU] | Freq: Three times a day (TID) | INTRAMUSCULAR | Status: DC
  Administered 2021-09-22 – 2021-09-23 (×4): 2 [IU] via SUBCUTANEOUS

## 2021-09-22 MED ORDER — FUROSEMIDE 20 MG PO TABS
20.0000 mg | ORAL_TABLET | Freq: Two times a day (BID) | ORAL | Status: DC
  Administered 2021-09-22 – 2021-09-23 (×3): 20 mg via ORAL
  Filled 2021-09-22 (×3): qty 1

## 2021-09-22 MED ORDER — ACETAMINOPHEN 325 MG PO TABS: 650.0000 mg | ORAL_TABLET | ORAL | Status: DC | PRN

## 2021-09-22 MED ORDER — METOPROLOL TARTRATE 25 MG PO TABS
25.0000 mg | ORAL_TABLET | Freq: Two times a day (BID) | ORAL | Status: DC
  Administered 2021-09-22 – 2021-09-23 (×3): 25 mg via ORAL
  Filled 2021-09-22 (×2): qty 1

## 2021-09-22 MED ORDER — LISINOPRIL 20 MG PO TABS
20.0000 mg | ORAL_TABLET | Freq: Every day | ORAL | Status: DC
  Administered 2021-09-22 – 2021-09-23 (×2): 20 mg via ORAL
  Filled 2021-09-22 (×2): qty 1

## 2021-09-22 MED ORDER — DILTIAZEM LOAD VIA INFUSION
10.0000 mg | Freq: Once | INTRAVENOUS | Status: AC
  Administered 2021-09-22: 10 mg via INTRAVENOUS
  Filled 2021-09-22: qty 10

## 2021-09-22 MED ORDER — METOPROLOL TARTRATE 25 MG PO TABS: 25.0000 mg | ORAL_TABLET | Freq: Every day | ORAL | Status: DC

## 2021-09-22 MED ORDER — POTASSIUM CITRATE ER 5 MEQ (540 MG) PO TBCR: 5.0000 meq | EXTENDED_RELEASE_TABLET | Freq: Three times a day (TID) | ORAL | Status: DC

## 2021-09-22 MED ORDER — ONDANSETRON HCL 4 MG/2ML IJ SOLN: 4.0000 mg | Freq: Four times a day (QID) | INTRAMUSCULAR | Status: DC | PRN

## 2021-09-22 MED ORDER — QUERCETIN 500 MG PO CAPS: 1000.0000 mg | ORAL_CAPSULE | Freq: Every day | ORAL | Status: DC

## 2021-09-22 MED ORDER — INSULIN ASPART 100 UNIT/ML IJ SOLN
0.0000 [IU] | Freq: Three times a day (TID) | INTRAMUSCULAR | Status: DC
  Administered 2021-09-22 – 2021-09-23 (×5): 4 [IU] via SUBCUTANEOUS

## 2021-09-22 MED ORDER — VITAMIN K2 100 MCG PO TABS: 100.0000 ug | ORAL_TABLET | Freq: Every day | ORAL | Status: DC

## 2021-09-22 MED ORDER — ALFUZOSIN HCL ER 10 MG PO TB24
10.0000 mg | ORAL_TABLET | Freq: Every evening | ORAL | Status: DC
  Administered 2021-09-22: 10 mg via ORAL
  Filled 2021-09-22 (×2): qty 1

## 2021-09-22 MED ORDER — DILTIAZEM HCL-DEXTROSE 125-5 MG/125ML-% IV SOLN (PREMIX)
5.0000 mg/h | INTRAVENOUS | Status: DC
  Administered 2021-09-22: 5 mg/h via INTRAVENOUS
  Filled 2021-09-22: qty 125

## 2021-09-22 MED ORDER — TROLAMINE SALICYLATE 10 % EX CREA: 1.0000 | TOPICAL_CREAM | CUTANEOUS | Status: DC | PRN

## 2021-09-22 NOTE — ED Notes (Signed)
Tylenol given for a headache

## 2021-09-22 NOTE — Progress Notes (Signed)
ANTICOAGULATION CONSULT NOTE   Pharmacy Consult for Heparin Indication: atrial fibrillation  No Known Allergies  Patient Measurements: Height: '5\' 9"'$  (175.3 cm) Weight: 104.3 kg (230 lb) IBW/kg (Calculated) : 70.7 Heparin Dosing Weight: 93.2 kg  Vital Signs: Temp: 97.9 F (36.6 C) (09/17 1610) Temp Source: Oral (09/17 1610) BP: 131/47 (09/17 1533) Pulse Rate: 73 (09/17 1533)  Labs: Recent Labs    09/22/21 0200 09/22/21 0353 09/22/21 1611  HGB 10.3*  --   --   HCT 31.7*  --   --   PLT 145*  --   --   HEPARINUNFRC  --   --  0.33  CREATININE 1.41*  --   --   TROPONINIHS 110* 792*  --      Estimated Creatinine Clearance: 53.8 mL/min (A) (by C-G formula based on SCr of 1.41 mg/dL (H)).   Medical History: Past Medical History:  Diagnosis Date   Anxiety    Aortic stenosis    Arthritis    CAD (coronary artery disease)    a. s/p DES to distal RCA in 08/2013, DES to LAD 07/2021   [facility])    prostate   CKD (chronic kidney disease)    Diabetes mellitus without complication (HCC)    Family history of colon cancer    GERD (gastroesophageal reflux disease)    Heart murmur    Hypercalcemia    Hypercholesteremia    Hypertension    Kidney stone    PAF (paroxysmal atrial fibrillation) (HCC)    Personal history of colonic polyps    Pneumonia    S/P TAVR (transcatheter aortic valve replacement) 09/17/2021   s/p TAVR with a 23 mm Edwards S3UR via the TF approach by Dr. Cleda Clarks     Assessment:  76 y.o. male is being seen 09/22/2021 for the evaluation of chest pain.  Patient with a history of atrial fibrillation only on plavix and aspirin at home, no other AC PTA. Pharmacy consulted for Heparin.  Initial heparin level therapeutic, low end, on 1300 units/hr  Goal of Therapy:  Heparin level 0.3-0.7 units/ml Monitor platelets by anticoagulation protocol: Yes   Plan:  Increase heparin gtt slightly to 1350 units/hr Daily heparin level, CBC, s/s bleeding F/u  long term AC plan and ability to transition to PO  Bertis Ruddy, PharmD Clinical Pharmacist ED Pharmacist Phone # [phone] 09/22/2021 4:57 PM

## 2021-09-22 NOTE — ED Provider Notes (Signed)
[facility] EMERGENCY DEPARTMENT Provider Note   CSN: [id number] Arrival date & time: 09/22/21  0105     History  Chief Complaint  Patient presents with   Chest Pain    William [name] is a 76 y.o. male.  HPI     This is a 76 year old male with a recent history of TAVR who presents with chest pain.  Patient reports he had acute onset of chest discomfort and pressure around 11 PM.  He does not feel palpitations.  He has had some lower extremity swelling.  He took several sublingual nitroglycerin and reports that his chest pain improved.  He does report an ongoing headache.  He is currently wearing an event monitor.  Home Medications Prior to Admission medications   Medication Sig Start Date End Date Taking? Authorizing Provider  acarbose (PRECOSE) 100 MG tablet Take 100 mg by mouth 3 (three) times daily with meals.    Yes [provider]  Acetylcysteine (NAC 600) 600 MG CAPS Take 600 mg by mouth daily.   Yes [provider]  alendronate (FOSAMAX) 70 MG tablet Take 1 tablet (70 mg total) by mouth every 7 (seven) days. Take with a full glass of water on an empty stomach. Patient taking differently: Take 70 mg by mouth every 7 (seven) days. Take with a full glass of water on an empty stomach. Taken Saturday/Sunday 04/15/21  Yes Nida, Marella Chimes, MD  alfuzosin (UROXATRAL) 10 MG 24 hr tablet Take 1 tablet (10 mg total) by mouth daily with breakfast. Patient taking differently: Take 10 mg by mouth every evening. 04/29/21  Yes McKenzie, Candee Furbish, MD  amLODipine (NORVASC) 5 MG tablet TAKE 1 TABLET(5 MG) BY MOUTH DAILY Patient taking differently: Take 5 mg by mouth daily. 11/30/17  Yes Herminio Commons, MD  Ascorbic Acid (VITAMIN C) 1000 MG tablet Take 1,000 mg by mouth every morning.   Yes [provider]  aspirin EC 81 MG tablet Take 81 mg by mouth daily.    Yes [provider]  Cholecalciferol (VITAMIN D3) 125 MCG (5000 UT)  TABS Take 5,000 Units by mouth daily.   Yes [provider]  clopidogrel (PLAVIX) 75 MG tablet Take 1 tablet (75 mg total) by mouth daily. 08/07/21 08/07/22 Yes [name], MD  Coenzyme Q10 (COQ10) 100 MG CAPS Take 100 mg by mouth daily.   Yes [provider]  FARXIGA 10 MG TABS tablet Take 10 mg by mouth daily.  12/01/17  Yes [provider]  ferrous sulfate 325 (65 FE) MG EC tablet Take 1 tablet (325 mg total) by mouth daily with breakfast. 08/13/21 08/13/22 Yes Jodi Mourning, Tivis Ringer, PA-C  furosemide (LASIX) 20 MG tablet Take 20 mg by mouth 2 (two) times daily.  10/11/14  Yes [provider]  glipiZIDE (GLUCOTROL) 10 MG tablet Take 10 mg by mouth 2 (two) times daily. 08/02/13  Yes [provider]  HYDROcodone-acetaminophen (NORCO) 10-325 MG tablet Take 1 tablet by mouth every 6 (six) hours as needed for moderate pain.   Yes [provider]  LEVEMIR FLEXTOUCH 100 UNIT/ML Pen Inject 90 Units into the skin at bedtime.  07/08/16  Yes [provider]  lisinopril (ZESTRIL) 20 MG tablet Take 1 tablet (20 mg total) by mouth daily. 03/28/19  Yes Herminio Commons, MD  Magnesium 400 MG TABS Take 400 mg by mouth daily.   Yes [provider]  Menatetrenone (VITAMIN K2) 100 MCG TABS Take 100 mcg  by mouth daily.   Yes [provider]  metFORMIN (GLUCOPHAGE) 1000 MG tablet Take 1,000 mg by mouth 2 (two) times daily. 09/14/14  Yes [provider]  metFORMIN (GLUCOPHAGE-XR) 500 MG 24 hr tablet Take 500 mg by mouth every evening. Take with 1000 Metformin   Yes [provider]  metoprolol tartrate (LOPRESSOR) 25 MG tablet Take 1 tablet (25 mg total) by mouth daily. 07/26/19  Yes Strader, [place] M, PA-C  nitroGLYCERIN (NITROSTAT) 0.4 MG SL tablet Place 1 tablet (0.4 mg total) under the tongue every 5 (five) minutes x 3 doses as needed for chest pain (if no relief after 2nd dose, proceed to the ED for an evalution or call  [phone]). 07/02/20  Yes Verta Ellen., NP  pantoprazole (PROTONIX) 40 MG tablet Take 40 mg by mouth every evening.  01/04/18  Yes [provider]  potassium citrate (UROCIT-K) 5 MEQ (540 MG) SR tablet Take 5 mEq by mouth 3 (three) times daily with meals. 03/28/21  Yes [provider]  Quercetin 500 MG CAPS Take 1,000 mg by mouth daily.   Yes [provider]  rosuvastatin (CRESTOR) 20 MG tablet Take 20 mg by mouth every evening.    Yes [provider]  tamsulosin (FLOMAX) 0.4 MG CAPS capsule Take 0.4 mg by mouth daily after supper. 08/02/13  Yes [provider]  trolamine salicylate (BLUE-EMU HEMP) 10 % cream Apply 1 application topically as needed for muscle pain.   Yes [provider]  vitamin B-12 (CYANOCOBALAMIN) 1000 MCG tablet Take 1,000 mcg by mouth daily.   Yes [provider]  zinc gluconate 50 MG tablet Take 50 mg by mouth daily.   Yes [provider]  ACCU-CHEK GUIDE test strip  05/21/21   [provider]  Accu-Chek Softclix Lancets lancets 1 each 3 (three) times daily. 05/20/21   [provider]      Allergies    Patient has no known allergies.    Review of Systems   Review of Systems  Constitutional:  Negative for fever.  Respiratory:  Positive for chest tightness and shortness of breath.   All other systems reviewed and are negative.   Physical Exam Updated Vital Signs BP (!) 130/50   Pulse 72   Temp 97.6 F (36.4 C)   Resp 15   Ht 1.753 m ('5\' 9"'$ )   Wt 104.3 kg   SpO2 98%   BMI 33.97 kg/m  Physical Exam Vitals and nursing note reviewed.  Constitutional:      Appearance: He is well-developed. He is not ill-appearing.  HENT:     Head: Normocephalic and atraumatic.  Eyes:     Pupils: Pupils are equal, round, and reactive to light.  Cardiovascular:     Rate and Rhythm: Tachycardia present. Rhythm irregular.     Heart sounds: Murmur heard.  Pulmonary:     Effort: Pulmonary  effort is normal. No respiratory distress.     Breath sounds: Normal breath sounds. No wheezing.  Abdominal:     General: Bowel sounds are normal.     Palpations: Abdomen is soft.     Tenderness: There is no abdominal tenderness. There is no rebound.  Musculoskeletal:     Cervical back: Neck supple.     Right lower leg: Edema present.     Left lower leg: Edema present.  Lymphadenopathy:     Cervical: No cervical adenopathy.  Skin:    General: Skin is warm and dry.  Neurological:     Mental Status: He is alert and oriented to person, place, and time.  Psychiatric:        Mood and Affect: Mood normal.     ED Results / Procedures / Treatments   Labs (all labs ordered are listed, but only abnormal results are displayed) Labs Reviewed  BASIC METABOLIC PANEL - Abnormal; Notable for the following components:      Result Value   Glucose, Bld 243 (*)    BUN 34 (*)    Creatinine, Ser 1.41 (*)    GFR, Estimated 52 (*)    All other components within normal limits  MAGNESIUM - Abnormal; Notable for the following components:   Magnesium 1.5 (*)    All other components within normal limits  CBC - Abnormal; Notable for the following components:   RBC 3.44 (*)    Hemoglobin 10.3 (*)    HCT 31.7 (*)    RDW 16.1 (*)    Platelets 145 (*)    All other components within normal limits  TROPONIN I (HIGH SENSITIVITY) - Abnormal; Notable for the following components:   Troponin I (High Sensitivity) 110 (*)    All other components within normal limits  TROPONIN I (HIGH SENSITIVITY)    EKG None 0103:  EKG with new atrial fibrillation, rate 119, LBBB  EKG Interpretation  Date/Time:  Sunday September 22 2021 05:00:14 EDT Ventricular Rate:  71 PR Interval:  184 QRS Duration: 156 QT Interval:  397 QTC Calculation: 432 R Axis:   20 Text Interpretation: Sinus rhythm Left bundle branch block Confirmed by Thayer Jew [phone]) on 09/22/2021 5:01:46 AM        Radiology DG Chest  Portable 1 View  Result Date: 09/22/2021 CLINICAL DATA:  Atrial fibrillation with chest pain. EXAM: PORTABLE CHEST 1 VIEW COMPARISON:  Chest x-ray 09/13/2021 FINDINGS: The heart is enlarged. There is no focal lung infiltrate, pleural effusion or pneumothorax. Generator overlies the left chest, unchanged. No acute fractures are seen. IMPRESSION: 1. Cardiomegaly. 2. No other acute cardiopulmonary process. Electronically Signed   By: Ronney Asters M.D.   On: 09/22/2021 01:58    Procedures .Critical Care  Performed by: Merryl Hacker, MD Authorized by: Merryl Hacker, MD   Critical care provider statement:    Critical care time (minutes):  45   Critical care was necessary to treat or prevent imminent or life-threatening deterioration of the following conditions: Arrhythmia.   Critical care was time spent personally by me on the following activities:  Development of treatment plan with patient or surrogate, discussions with consultants, evaluation of patient's response to treatment, examination of patient, ordering and review of laboratory studies, ordering and review of radiographic studies, ordering and performing treatments and interventions, pulse oximetry, re-evaluation of patient's condition and review of old charts     Medications Ordered in ED Medications  diltiazem (CARDIZEM) 1 mg/mL load via infusion 10 mg (10 mg Intravenous Bolus from Bag 09/22/21 0223)    And  diltiazem (CARDIZEM) 125 mg in dextrose 5% 125 mL (1 mg/mL) infusion (5 mg/hr Intravenous New Bag/Given 09/22/21 0240)  magnesium sulfate IVPB 2 g 50 mL (2 g Intravenous New Bag/Given 09/22/21 0451)  acetaminophen (TYLENOL) tablet 650 mg (650 mg Oral Given 09/22/21 0249)    ED Course/ Medical Decision Making/ A&P Clinical Course as of 09/22/21 0458  Sun Sep 22, 2021  0445 Patient on diltiazem drip.  At first glance, it appears that he has converted to sinus rhythm.  We will repeat EKG.  Cardiology reconsulted.  Troponin  110 which is slightly more elevated than his prior.  Magnesium low at 1.5.  Creatinine at baseline. [CH]    Clinical Course User Index [CH] Rhilee Currin, Barbette Hair, MD                           Medical Decision Making Amount and/or Complexity of Data Reviewed Labs: ordered. Radiology: ordered.  Risk OTC drugs. Prescription drug management. Decision regarding hospitalization.   This patient presents to the ED for concern of chest discomfort, this involves an extensive number of treatment options, and is a complaint that carries with it a high risk of complications and morbidity.  I considered the following differential and admission for this acute, potentially life threatening condition.  The differential diagnosis includes ACS, complication from TAVR, arrhythmia, PE, pneumonia  MDM:    This is a 76 year old male who presents with chest pain.  He is nontoxic-appearing.  EKG is consistent with atrial fibrillation with RVR.  This is a new diagnosis for this patient.  Feel this is likely what is causing the patient's symptoms.  Discussed briefly with cardiology.  Recommends treating as normal A-fib.  No further recommendations.  Labs obtained.  Magnesium low.  This was replaced.  Troponin slightly elevated.  Patient was started on a diltiazem drip.  On recheck it appears that he spontaneously converted.  Chest x-ray without pneumothorax or pneumonia.  Discussed with cardiology fellow.  We will plan for hobs admission to cardiology for conversion to oral rate control medication and NOAC.  (Labs, imaging, consults)  Labs: I Ordered, and personally interpreted labs.  The pertinent results include: CBC, BMP, troponin, magnesium  Imaging Studies ordered: I ordered imaging studies including chest x-ray I independently visualized and interpreted imaging. I agree with the radiologist interpretation  Additional history obtained from family at bedside and chart review.  External records from outside  source obtained and reviewed including recent TAVR  Cardiac Monitoring: The patient was maintained on a cardiac monitor.  I personally viewed and interpreted the cardiac monitored which showed an underlying rhythm of: Atrial fibrillation with RVR  Reevaluation: After the interventions noted above, I reevaluated the patient and found that they have :improved  Social Determinants of Health: Lives with family  Disposition: Admit  Co morbidities that complicate the patient evaluation  Past Medical History:  Diagnosis Date   Anxiety    Aortic stenosis    Arthritis    CAD (coronary artery disease)    a. s/p DES to distal RCA in 08/2013, DES to LAD 07/2021   Cancer [facility])    prostate   CKD (chronic kidney disease)    Diabetes mellitus without complication ([place])    Family history of colon cancer    GERD (gastroesophageal reflux disease)    Heart murmur    Hypercalcemia    Hypercholesteremia    Hypertension    Kidney stone    PAF (paroxysmal atrial fibrillation) ([place])    Personal history of colonic polyps    Pneumonia    S/P TAVR (transcatheter aortic valve replacement) 09/17/2021   s/p TAVR with a 23 mm Edwards S3UR via the TF approach by Dr. Angelena Form & Bartle     Medicines Meds ordered this encounter  Medications   AND Linked Order Group    diltiazem (CARDIZEM) 1 mg/mL load via infusion 10 mg    diltiazem (CARDIZEM) 125 mg in dextrose 5% 125  mL (1 mg/mL) infusion   acetaminophen (TYLENOL) tablet 650 mg   magnesium sulfate IVPB 2 g 50 mL    I have reviewed the patients home medicines and have made adjustments as needed  Problem List / ED Course: Problem List Items Addressed This Visit   None Visit Diagnoses     Atrial fibrillation with RVR (HCC)    -  Primary   Relevant Medications   diltiazem (CARDIZEM) 1 mg/mL load via infusion 10 mg (Completed)   diltiazem (CARDIZEM) 125 mg in dextrose 5% 125 mL (1 mg/mL) infusion   Hypomagnesemia                        Final Clinical Impression(s) / ED Diagnoses Final diagnoses:  Atrial fibrillation with RVR ([place])  Hypomagnesemia    Rx / DC Orders ED Discharge Orders          Ordered    Amb referral to AFIB Clinic        09/22/21 0133              Katalina Magri, Barbette Hair, MD 09/22/21 [phone]
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
## 2021-09-22 NOTE — Progress Notes (Signed)
Remains asymptomatic, remains in normal sinus rhythm. Discontinue diltiazem drip. Echo pending.  If this shows a new wall motion abnormality or if he has recurrent angina while in sinus rhythm, may need further evaluation for coronary insufficiency.  Otherwise, increase beta-blocker dose (as has been done) and add oral anticoagulant, Eliquis 5 mg twice daily.  It's been roughly 2 months since his LAD stent was placed and only a week since his TAVR procedure. It would not be surprising for him to develop angina during atrial fibrillation rapid ventricular response since he has chronic total occlusion of the right coronary artery, with left-to-right collaterals. Unless we identify a new coronary problem, I think it would be wisest to stop aspirin and continue Eliquis plus clopidogrel.

## 2021-09-22 NOTE — ED Triage Notes (Signed)
The pt arrived by [facility] from home chest pain since 2300  he has a valve placed in his heart last tuesdayhe took several sl nitro  his chest pain is better but he has a severe headache.   A and o x4  iv per ems

## 2021-09-22 NOTE — Progress Notes (Signed)
ANTICOAGULATION CONSULT NOTE - Initial Consult  Pharmacy Consult for Heparin Indication: atrial fibrillation  No Known Allergies  Patient Measurements: Height: '5\' 9"'$  (175.3 cm) Weight: 104.3 kg (230 lb) IBW/kg (Calculated) : 70.7 Heparin Dosing Weight: 93.2 kg  Vital Signs: Temp: 97.6 F (36.4 C) (09/17 0439) BP: 130/50 (09/17 0430) Pulse Rate: 72 (09/17 0430)  Labs: Recent Labs    09/22/21 0200 09/22/21 0353  HGB 10.3*  --   HCT 31.7*  --   PLT 145*  --   CREATININE 1.41*  --   TROPONINIHS 110* 792*    Estimated Creatinine Clearance: 53.8 mL/min (A) (by C-G formula based on SCr of 1.41 mg/dL (H)).   Medical History: Past Medical History:  Diagnosis Date   Anxiety    Aortic stenosis    Arthritis    CAD (coronary artery disease)    a. s/p DES to distal RCA in 08/2013, DES to LAD 07/2021   Cancer [facility])    prostate   CKD (chronic kidney disease)    Diabetes mellitus without complication ([facility])    Family history of colon cancer    GERD (gastroesophageal reflux disease)    Heart murmur    Hypercalcemia    Hypercholesteremia    Hypertension    Kidney stone    PAF (paroxysmal atrial fibrillation) ([facility])    Personal history of colonic polyps    Pneumonia    S/P TAVR (transcatheter aortic valve replacement) 09/17/2021   s/p TAVR with a 23 mm Edwards S3UR via the TF approach by Dr. Cleda Clarks     Assessment:  76 y.o. male is being seen 09/22/2021 for the evaluation of chest pain.  Patient with a history of atrial fibrillation only on plavix and aspirin at home, no other AC PTA. Pharmacy consulted for Heparin.  Goal of Therapy:  Heparin level 0.3-0.7 units/ml Monitor platelets by anticoagulation protocol: Yes   Plan:  Give 4000 units bolus x 1 Start heparin infusion at 1300 units/hr Check anti-Xa level in 8 hours and daily while on heparin Continue to monitor H&H and platelets  Alanda Slim, PharmD, [facility] Clinical Pharmacist Please see AMION for all  Pharmacists' Contact Phone Numbers 09/22/2021, 6:24 AM

## 2021-09-23 ENCOUNTER — Telehealth: Payer: Self-pay

## 2021-09-23 ENCOUNTER — Telehealth (HOSPITAL_COMMUNITY): Payer: Self-pay | Admitting: Pharmacy Technician

## 2021-09-23 ENCOUNTER — Observation Stay (HOSPITAL_BASED_OUTPATIENT_CLINIC_OR_DEPARTMENT_OTHER): Payer: Medicare HMO

## 2021-09-23 ENCOUNTER — Other Ambulatory Visit (HOSPITAL_COMMUNITY): Payer: Self-pay

## 2021-09-23 DIAGNOSIS — E1122 Type 2 diabetes mellitus with diabetic chronic kidney disease: Secondary | ICD-10-CM

## 2021-09-23 DIAGNOSIS — Z9861 Coronary angioplasty status: Secondary | ICD-10-CM

## 2021-09-23 DIAGNOSIS — R079 Chest pain, unspecified: Secondary | ICD-10-CM

## 2021-09-23 DIAGNOSIS — I4891 Unspecified atrial fibrillation: Secondary | ICD-10-CM | POA: Diagnosis not present

## 2021-09-23 DIAGNOSIS — N183 Chronic kidney disease, stage 3 unspecified: Secondary | ICD-10-CM

## 2021-09-23 DIAGNOSIS — I2510 Atherosclerotic heart disease of native coronary artery without angina pectoris: Secondary | ICD-10-CM | POA: Diagnosis not present

## 2021-09-23 DIAGNOSIS — I447 Left bundle-branch block, unspecified: Secondary | ICD-10-CM | POA: Diagnosis not present

## 2021-09-23 DIAGNOSIS — Z952 Presence of prosthetic heart valve: Secondary | ICD-10-CM

## 2021-09-23 LAB — ECHOCARDIOGRAM LIMITED
AR max vel: 1.69 cm2
AV Area VTI: 1.68 cm2
AV Area mean vel: 1.7 cm2
AV Mean grad: 13 mmHg
AV Peak grad: 22.3 mmHg
Ao pk vel: 2.36 m/s
Height: 69 in
Weight: 3680 oz

## 2021-09-23 LAB — CBC
HCT: 31.7 % — ABNORMAL LOW (ref 39.0–52.0)
Hemoglobin: 10.4 g/dL — ABNORMAL LOW (ref 13.0–17.0)
MCH: 29.5 pg (ref 26.0–34.0)
MCHC: 32.8 g/dL (ref 30.0–36.0)
MCV: 90.1 fL (ref 80.0–100.0)
Platelets: 148 10*3/uL — ABNORMAL LOW (ref 150–400)
RBC: 3.52 MIL/uL — ABNORMAL LOW (ref 4.22–5.81)
RDW: 15.9 % — ABNORMAL HIGH (ref 11.5–15.5)
WBC: 7.7 10*3/uL (ref 4.0–10.5)
nRBC: 0 % (ref 0.0–0.2)

## 2021-09-23 LAB — HEPARIN LEVEL (UNFRACTIONATED): Heparin Unfractionated: 0.34 IU/mL (ref 0.30–0.70)

## 2021-09-23 LAB — GLUCOSE, CAPILLARY
Glucose-Capillary: 187 mg/dL — ABNORMAL HIGH (ref 70–99)
Glucose-Capillary: 235 mg/dL — ABNORMAL HIGH (ref 70–99)

## 2021-09-23 MED ORDER — APIXABAN 5 MG PO TABS: 5.0000 mg | ORAL_TABLET | Freq: Two times a day (BID) | ORAL | 2 refills | Status: DC

## 2021-09-23 MED ORDER — METOPROLOL TARTRATE 25 MG PO TABS: 25.0000 mg | ORAL_TABLET | Freq: Two times a day (BID) | ORAL | 2 refills | Status: DC

## 2021-09-23 MED ORDER — APIXABAN 5 MG PO TABS: 5.0000 mg | ORAL_TABLET | Freq: Two times a day (BID) | ORAL | Status: DC

## 2021-09-23 NOTE — Discharge Instructions (Signed)

## 2021-09-23 NOTE — Progress Notes (Signed)
ANTICOAGULATION CONSULT NOTE - Follow Up Consult  Pharmacy Consult for Heparin Indication: atrial fibrillation  No Known Allergies  Patient Measurements: Height: '5\' 9"'$  (175.3 cm) Weight: 104.3 kg (230 lb) IBW/kg (Calculated) : 70.7 Heparin Dosing Weight: 93 kg  Vital Signs: Temp: 97.7 F (36.5 C) (09/18 0738) Temp Source: Oral (09/18 0738) BP: 141/54 (09/18 0738) Pulse Rate: 79 (09/18 0738)  Labs: Recent Labs    09/22/21 0200 09/22/21 0353 09/22/21 1611 09/23/21 0322  HGB 10.3*  --   --  10.4*  HCT 31.7*  --   --  31.7*  PLT 145*  --   --  148*  HEPARINUNFRC  --   --  0.33 0.34  CREATININE 1.41*  --   --   --   TROPONINIHS 110* 792*  --   --     Estimated Creatinine Clearance: 53.8 mL/min (A) (by C-G formula based on SCr of 1.41 mg/dL (H)).  Assessment:  76 y.o. male admitted 09/22/2021 for the evaluation of chest pain.  Patient with a history of atrial fibrillation only on plavix and aspirin at home, no other AC PTA. Pharmacy consulted for Heparin. Recent TAVR 09/17/21.   Heparin level remains low therapeutic (0.34) on heparin at 1350 units/hr.  CBC low stable.  No bleeding reported.  Noted plan for Eliquis and stop Aspirin.  Goal of Therapy:  Heparin level 0.3-0.7 units/ml Monitor platelets by anticoagulation protocol: Yes   Plan:  Continue heparin drip at 1350 units/hr Daily heparin level and CBC while on heparin. Follow up for transition to Eliquis.  Arty Baumgartner, RPh 09/23/2021,10:38 AM

## 2021-09-23 NOTE — TOC Benefit Eligibility Note (Signed)
Patient Research scientist (life sciences) completed.     The patient is currently admitted and upon discharge could be taking Eliquis '5mg'$ .   The current 30 day co-pay is, $75.02.   The patient is insured through [employer].

## 2021-09-23 NOTE — Progress Notes (Signed)
Mobility Specialist Progress Note:   09/23/21 1002  Mobility  Activity Ambulated with assistance in hallway  Level of Assistance Minimal assist, patient does 75% or more  Assistive Device Front wheel walker  Distance Ambulated (ft) 240 ft  Activity Response Tolerated well  $Mobility charge 1 Mobility   Pt received in bed willing to participate in mobility. No complaints of chest pain. MinA to stand then stand-by throughout ambulation. Left in bed with call bell in reach and all needs met.   Pre- Mobility:  74 HR During Mobility: 90 HR Post Mobility:   73 HR  William [name] Air traffic controller only

## 2021-09-23 NOTE — Progress Notes (Signed)
Echocardiogram 2D Echocardiogram has been performed.  Oneal Deputy Chukwuemeka Artola RDCS 09/23/2021, 9:58 AM

## 2021-09-23 NOTE — Telephone Encounter (Signed)
Pharmacy Patient Advocate Encounter  Insurance verification completed.    The patient is insured through [employer]   The patient is currently admitted and ran test claims for the following: Eliquis '5mg'$ .  Copays and coinsurance results were relayed to Inpatient clinical team.

## 2021-09-23 NOTE — Discharge Summary (Signed)
Discharge Summary    Patient ID: William [name] MRN: [id number]; DOB: 08/21/1945  Admit date: 09/22/2021 Discharge date: 09/23/2021  PCP:  [name], [place] [facility] Cardiologist:  [name], MD  Cardiology APP:  Imogene Burn, PA-C  {   Discharge Diagnoses    Principal Problem:   Atrial fibrillation with RVR ([place]) Active Problems:   DM (diabetes mellitus) ([place])   HTN (hypertension)   CKD stage 3 due to type 2 diabetes mellitus ([place])   S/P TAVR (transcatheter aortic valve replacement)   CAD S/P percutaneous coronary angioplasty   LBBB (left bundle branch block)    Diagnostic Studies/Procedures    Echo from today showed :   1. Left ventricular ejection fraction, by estimation, is 60 to 65%. The  left ventricle has normal function. The left ventricle has no regional  wall motion abnormalities.   2. Right ventricular systolic function is normal. The right ventricular  size is normal.   3. The mitral valve is degenerative. Mild mitral valve regurgitation. No  evidence of mitral stenosis. Moderate mitral annular calcification.   4. The aortic valve is normal in structure. Aortic valve regurgitation is  not visualized. No aortic stenosis is present. There is a 23 mm Edwards  Sapien prosthetic (TAVR) valve present in the aortic position. Procedure  Date: 09/17/21. Aortic valve area, by VTI measures 1.68 cm. Aortic valve mean gradient measures 13.0 mmHg. Aortic valve Vmax measures 2.36 m/s.   5. The inferior vena cava is normal in size with greater than 50%  respiratory variability, suggesting right atrial pressure of 3 mmHg.  _____________   History of Present Illness     Per admission H&P 09/22/21:  William [name] is a 76 y.o. male with a PMHx of  CAD s/p PCTA/DES x1 mLAD with intracoronary lithotripsy (07/15/21) and severe aortic stenosis s/p a 23 mm Edwards Sapien 3 THV  09/17/21, new LBBB after TAVR, anxiety, prostate cancer, diabetes  mellitus, CKD stage 2, HTN, chart history of PAF but not recently, HLD, who is being seen 09/22/2021 for the evaluation of chest pain.  [name] stated he has been eating and drinking well after being discharged on 09/18/21. He went to bed last night and suddenly developed mid-chest sharp pain without radiation around 11pm. The pain was constant. There was no aggravating factors he call. He took several SL nitro and reported his chest pain improved. His granddaughter noticed some swelling of his left lower extremity. Denied fever, chills, dizziness, syncope, lightheadedness, cough,  heart palpitations, dyspnea, nausea, vomiting, PND, abdominal fullness, dysuria, diarrhea, or any bleeding events.   He was found to be in atrial fibrillation with RVR upon arrival to our ED. He was started on IV diltiazem. At the time of evaluation he was sinus rhythm in the 70s and reported his chest pain completely resolved. He reported compliant with his medications.   Admission provider was notified by the device company that patient went to Afib with RVR around the same time when he presented to our ED.   On admission Sodium 141, potassium 4.3, glucose 243, creatinine 1.4, magnesium 1.5, WBC 9.9, hemoglobin 10.3    Hospital Course     Consultants: N/A   New onset paroxysmal atrial fibrillation with RVR LBBB - s/p TAVR 09/17/21, noted new onset LBBB post procedure, and was discharged on 09/18/21, returned to ER on 09/22/21 with chest pain, found in A fib RVR by Zio monitor and telemetry at  ED - converted to SR spontaneously, received cardizem gtt at ED which has been weaned off, metoprolol was increased from 25 daily to BID for rate control, remains in SR, continue Zio for A fib burden monitor as well as high grade AVB monitor  - CHA2DS2-VASc Score = 5 , started on Eliquis '5mg'$  BID, tolerated IV Heparin gtt  - Follow up with cardiology 10/14/21  - New script sent to his pharmacy today   Severe aortic stenosis  -  s/p successful TAVR with a 23 mm Edwards Sapien 3 THV via the TF approach on 09/17/21 - Echo repeated today showed LVEF 60-65%, no RWMA, normal RV, mild MR, no AR, no AS, 23 mm Edwards  Sapien prosthetic (TAVR) valve present in the aortic position, Aortic valve area, by VTI measures 1.68 cm. Aortic valve mean gradient measures 13.0 mmHg. Aortic valve Vmax measures 2.36 m/s. - Follow up with cardiology 10/14/21   CAD - Hx of DES placement to the distal RCA in 2015 - Hx of PCTA/DES x1 mLAD with intracoronary lithotripsy 07/15/21, moderate mid Circumflex stenosis, moderate stenosis OM3, and large dominant RCA with total occlusion (CTO) of the mid RCA with distal RCA fills from left to right collaterals.  - Hs trop 110 >792, likely demand due to recent TAVR + new onset A fib RVR - EKG not diagnostics  - suppose to be on DAPT with ASA and Plavix for 6 month from 07/15/21, now on Eliquis, thus stopping ASA, would continue Plavix + Elqiuis going forward  - continue metoprolol and crestor and lisinopril  - no further chest pain at this time  -  Follow up with cardiology 10/14/21   DMT2:  -  resume PTA meds Metformin and Faxiga and Levemir and glipizide    HTN:  - BP controlled on amlodipine, lisinopril, lasix, and metoprolol   CKD II - renal index near baseline    Kidney stones - pre TAVR scans showed "a small stone of the left ureterovesicular junction measuring 4 mm, likely nonobstructive given lack of hydronephrosis. Recommend urologic consultation" This information was faxed to his urologist Dr. Alyson Ingles from previous hospitalization     Thyroid nodule - Pre TAVR scans showed "a right thyroid nodule measuring 1.1 cm. Recommend thyroid ultrasound for further evaluation." This can be followed up outpatient with PCP    GERD - on protonix    Did the patient have an acute coronary syndrome (MI, NSTEMI, STEMI, etc) this admission?:  No                               Did the patient have a  percutaneous coronary intervention (stent / angioplasty)?:  No.        The patient will be scheduled for a TOC follow up appointment in 21 days.  A message has been sent to the [facility] and Scheduling Pool at the office where the patient should be seen for follow up.  _____________  Discharge Vitals Blood pressure 128/62, pulse 72, temperature 98.1 F (36.7 C), temperature source Oral, resp. rate 20, height '5\' 9"'$  (1.753 m), weight 104.3 kg, SpO2 98 %.  Filed Weights   09/22/21 0121  Weight: 104.3 kg   See MD round note today for physical exam   Labs & Radiologic Studies    CBC Recent Labs    09/22/21 0200 09/23/21 0322  WBC 9.9 7.7  HGB 10.3* 10.4*  HCT 31.7* 31.7*  MCV 92.2 90.1  PLT 145* 025*   Basic Metabolic Panel Recent Labs    09/22/21 0200 09/22/21 1611  NA 141  --   K 4.3  --   CL 110  --   CO2 22  --   GLUCOSE 243*  --   BUN 34*  --   CREATININE 1.41*  --   CALCIUM 10.3  --   MG 1.5* 1.7   Liver Function Tests No results for input(s): "AST", "ALT", "ALKPHOS", "BILITOT", "PROT", "ALBUMIN" in the last 72 hours. No results for input(s): "LIPASE", "AMYLASE" in the last 72 hours. High Sensitivity Troponin:   Recent Labs  Lab 09/02/21 0438 09/02/21 0632 09/02/21 0942 09/22/21 0200 09/22/21 0353  TROPONINIHS 37* 62* 99* 110* 792*    BNP Invalid input(s): "POCBNP" D-Dimer No results for input(s): "DDIMER" in the last 72 hours. Hemoglobin A1C No results for input(s): "HGBA1C" in the last 72 hours. Fasting Lipid Panel No results for input(s): "CHOL", "HDL", "LDLCALC", "TRIG", "CHOLHDL", "LDLDIRECT" in the last 72 hours. Thyroid Function Tests No results for input(s): "TSH", "T4TOTAL", "T3FREE", "THYROIDAB" in the last 72 hours.  Invalid input(s): "FREET3" _____________  ECHOCARDIOGRAM LIMITED  Result Date: 09/23/2021    ECHOCARDIOGRAM LIMITED REPORT   Patient Name:   William [name] Date of Exam: 09/23/2021 Medical Rec #:  [id number]     Height:        69.0 in Accession #:    [id number]    Weight:       230.0 lb Date of Birth:  October 03, 1945     BSA:          2.192 m Patient Age:    29 years      BP:           141/54 mmHg Patient Gender: M             HR:           72 bpm. Exam Location:  Inpatient Procedure: Limited Color Doppler, Limited Echo and Cardiac Doppler Indications:    R07.9* Chest pain, unspecified  History:        Patient has prior history of Echocardiogram examinations, most                 recent 09/19/2021. CAD, Arrythmias:Atrial Fibrillation; Risk                 Factors:Hypertension, Diabetes and Dyslipidemia.                 Aortic Valve: 23 mm Edwards Sapien prosthetic, stented (TAVR)                 valve is present in the aortic position. Procedure Date:                 09/17/21.  Sonographer:    Ronny Flurry Sonographer#2:  Raquel Sarna Senior Referring Phys: [id number] FAN YE  Sonographer Comments: Limited echo to assess for new WMAs or prosthetic dysfunction, full exam performed last 4 days prior. IMPRESSIONS  1. Left ventricular ejection fraction, by estimation, is 60 to 65%. The left ventricle has normal function. The left ventricle has no regional wall motion abnormalities.  2. Right ventricular systolic function is normal. The right ventricular size is normal.  3. The mitral valve is degenerative. Mild mitral valve regurgitation. No evidence of mitral stenosis. Moderate mitral annular calcification.  4. The aortic valve is normal in structure. Aortic valve regurgitation is not visualized. No aortic stenosis is  present. There is a 23 mm Edwards Sapien prosthetic (TAVR) valve present in the aortic position. Procedure Date: 09/17/21. Aortic valve area, by VTI measures 1.68 cm. Aortic valve mean gradient measures 13.0 mmHg. Aortic valve Vmax measures 2.36 m/s.  5. The inferior vena cava is normal in size with greater than 50% respiratory variability, suggesting right atrial pressure of 3 mmHg. FINDINGS  Left Ventricle: Left ventricular ejection  fraction, by estimation, is 60 to 65%. The left ventricle has normal function. The left ventricle has no regional wall motion abnormalities. The left ventricular internal cavity size was normal in size. There is  no left ventricular hypertrophy. Right Ventricle: The right ventricular size is normal. No increase in right ventricular wall thickness. Right ventricular systolic function is normal. Left Atrium: Left atrial size was normal in size. Right Atrium: Right atrial size was normal in size. Pericardium: There is no evidence of pericardial effusion. Mitral Valve: The mitral valve is degenerative in appearance. There is mild calcification of the mitral valve leaflet(s). Moderate mitral annular calcification. Mild mitral valve regurgitation. No evidence of mitral valve stenosis. Tricuspid Valve: The tricuspid valve is normal in structure. Tricuspid valve regurgitation is not demonstrated. No evidence of tricuspid stenosis. Aortic Valve: The aortic valve is normal in structure. Aortic valve regurgitation is not visualized. No aortic stenosis is present. Aortic valve mean gradient measures 13.0 mmHg. Aortic valve peak gradient measures 22.3 mmHg. Aortic valve area, by VTI measures 1.68 cm. There is a 23 mm Edwards Sapien prosthetic, stented (TAVR) valve present in the aortic position. Procedure Date: 09/17/21. Pulmonic Valve: The pulmonic valve was normal in structure. Pulmonic valve regurgitation is not visualized. No evidence of pulmonic stenosis. Aorta: The aortic root is normal in size and structure. Venous: The inferior vena cava is normal in size with greater than 50% respiratory variability, suggesting right atrial pressure of 3 mmHg. IAS/Shunts: No atrial level shunt detected by color flow Doppler. LEFT VENTRICLE PLAX 2D LVOT diam:     2.00 cm LV SV:         86 LV SV Index:   39 LVOT Area:     3.14 cm  AORTIC VALVE AV Area (Vmax):    1.69 cm AV Area (Vmean):   1.70 cm AV Area (VTI):     1.68 cm AV Vmax:            236.00 cm/s AV Vmean:          175.000 cm/s AV VTI:            0.509 m AV Peak Grad:      22.3 mmHg AV Mean Grad:      13.0 mmHg LVOT Vmax:         127.00 cm/s LVOT Vmean:        94.700 cm/s LVOT VTI:          0.273 m LVOT/AV VTI ratio: 0.54  SHUNTS Systemic VTI:  0.27 m Systemic Diam: 2.00 cm Fransico Him MD Electronically signed by Fransico Him MD Signature Date/Time: 09/23/2021/10:32:45 AM    Final    DG Chest Portable 1 View  Result Date: 09/22/2021 CLINICAL DATA:  Atrial fibrillation with chest pain. EXAM: PORTABLE CHEST 1 VIEW COMPARISON:  Chest x-ray 09/13/2021 FINDINGS: The heart is enlarged. There is no focal lung infiltrate, pleural effusion or pneumothorax. Generator overlies the left chest, unchanged. No acute fractures are seen. IMPRESSION: 1. Cardiomegaly. 2. No other acute cardiopulmonary process. Electronically Signed   By: Ronney Asters  M.D.   On: 09/22/2021 01:58   ECHOCARDIOGRAM COMPLETE  Result Date: 09/19/2021    ECHOCARDIOGRAM REPORT   Patient Name:   William [name] Date of Exam: 09/18/2021 Medical Rec #:  [id number]     Height:       69.0 in Accession #:    [id number]    Weight:       226.2 lb Date of Birth:  [birth year]     BSA:          2.177 m Patient Age:    66 years      BP:           119/50 mmHg Patient Gender: M             HR:           82 bpm. Exam Location:  Inpatient Procedure: 3D Echo, 2D Echo, Cardiac Doppler and Color Doppler Indications:    I35.0 Nonrheumatic aortic (valve) stenosis  History:        Patient has prior history of Echocardiogram examinations, most                 recent 09/17/2021. CHF, CAD and Previous Myocardial Infarction,                 Aortic Valve Disease, Signs/Symptoms:Chest Pain; Risk                 Factors:Former Smoker.                 Aortic Valve: 23 mm Sapien prosthetic, stented (TAVR) valve is                 present in the aortic position. Procedure Date: 09/17/2021.  Sonographer:    Roseanna Rainbow RDCS Referring Phys: [id number] [place]  Sonographer Comments: Post TAVR IMPRESSIONS  1. 23 mm S3 TAVR. Vmax 2.6 m/s, MG 17 mmHG, EOA 1.94 cm2, DI 0.47. No regurgitation or paravalvular leak. Gradient higher tha postop but within limits. The aortic valve has been repaired/replaced. Aortic valve regurgitation is not visualized. There is a  23 mm Sapien prosthetic (TAVR) valve present in the aortic position. Procedure Date: 09/17/2021.  2. Left ventricular ejection fraction, by estimation, is 65 to 70%. The left ventricle has normal function. The left ventricle has no regional wall motion abnormalities. There is moderate concentric left ventricular hypertrophy. Left ventricular diastolic parameters are consistent with Grade I diastolic dysfunction (impaired relaxation).  3. Right ventricular systolic function is normal. The right ventricular size is normal. Tricuspid regurgitation signal is inadequate for assessing PA pressure.  4. The mitral valve is degenerative. Moderate mitral valve regurgitation. Mild mitral stenosis. The mean mitral valve gradient is 4.0 mmHg with average heart rate of 79 bpm.  5. The inferior vena cava is normal in size with greater than 50% respiratory variability, suggesting right atrial pressure of 3 mmHg. FINDINGS  Left Ventricle: Left ventricular ejection fraction, by estimation, is 65 to 70%. The left ventricle has normal function. The left ventricle has no regional wall motion abnormalities. The left ventricular internal cavity size was normal in size. There is  moderate concentric left ventricular hypertrophy. Left ventricular diastolic parameters are consistent with Grade I diastolic dysfunction (impaired relaxation). Right Ventricle: The right ventricular size is normal. No increase in right ventricular wall thickness. Right ventricular systolic function is normal. Tricuspid regurgitation signal is inadequate for assessing PA pressure. Left Atrium: Left atrial size was normal in size. Right Atrium: Right  atrial size  was normal in size. Pericardium: Trivial pericardial effusion is present. Presence of epicardial fat layer. Mitral Valve: The mitral valve is degenerative in appearance. Mild to moderate mitral annular calcification. Moderate mitral valve regurgitation. Mild mitral valve stenosis. MV peak gradient, 10.8 mmHg. The mean mitral valve gradient is 4.0 mmHg with average heart rate of 79 bpm. Tricuspid Valve: The tricuspid valve is grossly normal. Tricuspid valve regurgitation is not demonstrated. No evidence of tricuspid stenosis. Aortic Valve: 23 mm S3 TAVR. Vmax 2.6 m/s, MG 17 mmHG, EOA 1.94 cm2, DI 0.47. No regurgitation or paravalvular leak. Gradient higher tha postop but within limits. The aortic valve has been repaired/replaced. Aortic valve regurgitation is not visualized. Aortic valve mean gradient measures 17.0 mmHg. Aortic valve peak gradient measures 27.3 mmHg. Aortic valve area, by VTI measures 1.94 cm. There is a 23 mm Sapien prosthetic, stented (TAVR) valve present in the aortic position. Procedure Date: 09/17/2021. Pulmonic Valve: The pulmonic valve was grossly normal. Pulmonic valve regurgitation is not visualized. No evidence of pulmonic stenosis. Aorta: The aortic root and ascending aorta are structurally normal, with no evidence of dilitation. Venous: The inferior vena cava is normal in size with greater than 50% respiratory variability, suggesting right atrial pressure of 3 mmHg. IAS/Shunts: The atrial septum is grossly normal.  LEFT VENTRICLE PLAX 2D LVIDd:         3.80 cm     Diastology LVIDs:         2.60 cm     LV e' medial:    4.90 cm/s LV PW:         1.90 cm     LV E/e' medial:  23.1 LV IVS:        1.60 cm     LV e' lateral:   4.90 cm/s LVOT diam:     2.30 cm     LV E/e' lateral: 23.1 LV SV:         96 LV SV Index:   44 LVOT Area:     4.15 cm                             3D Volume EF: LV Volumes (MOD)           3D EF:        62 % LV vol d, MOD A2C: 80.8 ml LV EDV:       202 ml LV vol d, MOD A4C:  88.7 ml LV ESV:       76 ml LV vol s, MOD A2C: 30.6 ml LV SV:        126 ml LV vol s, MOD A4C: 32.6 ml LV SV MOD A2C:     50.2 ml LV SV MOD A4C:     88.7 ml LV SV MOD BP:      53.7 ml RIGHT VENTRICLE             IVC RV S prime:     11.30 cm/s  IVC diam: 1.80 cm TAPSE (M-mode): 2.3 cm LEFT ATRIUM             Index        RIGHT ATRIUM           Index LA diam:        3.70 cm 1.70 cm/m   RA Area:     11.20 cm LA Vol (A2C):   47.3 ml 21.73 ml/m  RA  Volume:   22.60 ml  10.38 ml/m LA Vol (A4C):   73.0 ml 33.53 ml/m LA Biplane Vol: 61.1 ml 28.07 ml/m  AORTIC VALVE AV Area (Vmax):    2.23 cm AV Area (Vmean):   2.12 cm AV Area (VTI):     1.94 cm AV Vmax:           261.33 cm/s AV Vmean:          175.667 cm/s AV VTI:            0.496 m AV Peak Grad:      27.3 mmHg AV Mean Grad:      17.0 mmHg LVOT Vmax:         140.00 cm/s LVOT Vmean:        89.700 cm/s LVOT VTI:          0.232 m LVOT/AV VTI ratio: 0.47  AORTA Ao Root diam: 3.80 cm Ao Asc diam:  3.20 cm MITRAL VALVE MV Area (PHT): 3.10 cm     SHUNTS MV Area VTI:   2.28 cm     Systemic VTI:  0.23 m MV Peak grad:  10.8 mmHg    Systemic Diam: 2.30 cm MV Mean grad:  4.0 mmHg MV Vmax:       1.64 m/s MV Vmean:      102.0 cm/s MV Decel Time: 245 msec MR PISA:        1.01 cm MR PISA Radius: 0.40 cm MV E velocity: 113.00 cm/s MV A velocity: 144.50 cm/s MV E/A ratio:  0.78 Eleonore Chiquito MD Electronically signed by Eleonore Chiquito MD Signature Date/Time: 09/19/2021/10:10:36 AM    Final    ECHOCARDIOGRAM LIMITED  Result Date: 09/17/2021    ECHOCARDIOGRAM LIMITED REPORT   Patient Name:   William [name] Date of Exam: 09/17/2021 Medical Rec #:  [id number]     Height:       69.0 in Accession #:    [id number]    Weight:       227.0 lb Date of Birth:  13-Oct-1945     BSA:          2.180 m Patient Age:    27 years      BP:           140/72 mmHg Patient Gender: M             HR:           63 bpm. Exam Location:  Inpatient Procedure: Limited Echo, Cardiac Doppler and Color Doppler  Indications:     I35.0 Nonrheumatic aortic (valve) stenosis  History:         Patient has prior history of Echocardiogram examinations, most                  recent 06/19/2021. CHF, CAD and Previous Myocardial Infarction,                  Aortic Valve Disease, Signs/Symptoms:Chest Pain; Risk                  Factors:Former Smoker. Severe aortic stenosis.                  Aortic Valve: 23 mm Sapien prosthetic, stented (TAVR) valve is                  present in the aortic position. Procedure Date: 09/17/2021.  Sonographer:     Roseanna Rainbow RDCS Referring Phys:  Annita Brod  [facility] Diagnosing Phys: Eleonore Chiquito MD IMPRESSIONS  1. Echo guided TAVR. 23 mm S3. No regurgitation or paravalvular leak. Vmax 1.9 m/s, MG 8.0 mmHG, EOA 2.01 cm2, DI 0.48. No immediate complications. The aortic valve has been repaired/replaced. Aortic valve regurgitation is not visualized. There is a 23 mm Sapien prosthetic (TAVR) valve present in the aortic position. Procedure Date: 09/17/2021.  2. Left ventricular ejection fraction, by estimation, is 60 to 65%. The left ventricle has normal function. The left ventricle has no regional wall motion abnormalities.  3. Right ventricular systolic function is normal. The right ventricular size is normal.  4. The mitral valve is degenerative. Mild mitral valve regurgitation. No evidence of mitral stenosis. FINDINGS  Left Ventricle: Left ventricular ejection fraction, by estimation, is 60 to 65%. The left ventricle has normal function. The left ventricle has no regional wall motion abnormalities. Right Ventricle: The right ventricular size is normal. No increase in right ventricular wall thickness. Right ventricular systolic function is normal. Pericardium: There is no evidence of pericardial effusion. Mitral Valve: The mitral valve is degenerative in appearance. Mild to moderate mitral annular calcification. Mild mitral valve regurgitation. No evidence of mitral valve stenosis. Tricuspid Valve: The  tricuspid valve is grossly normal. Tricuspid valve regurgitation is trivial. Aortic Valve: Echo guided TAVR. 23 mm S3. No regurgitation or paravalvular leak. Vmax 1.9 m/s, MG 8.0 mmHG, EOA 2.01 cm2, DI 0.48. No immediate complications. The aortic valve has been repaired/replaced. Aortic valve regurgitation is not visualized. Aortic valve mean gradient measures 8.0 mmHg. Aortic valve peak gradient measures 13.7 mmHg. Aortic valve area, by VTI measures 2.01 cm. There is a 23 mm Sapien prosthetic, stented (TAVR) valve present in the aortic position. Procedure Date: 09/17/2021. Aorta: The aortic root and ascending aorta are structurally normal, with no evidence of dilitation. LEFT VENTRICLE PLAX 2D LVOT diam:     2.30 cm LV SV:         92 LV SV Index:   42 LVOT Area:     4.15 cm  LV Volumes (MOD) LV vol d, MOD A2C: 105.0 ml LV vol d, MOD A4C: 100.0 ml LV vol s, MOD A2C: 29.3 ml LV vol s, MOD A4C: 39.1 ml LV SV MOD A2C:     75.8 ml LV SV MOD A4C:     100.0 ml LV SV MOD BP:      71.0 ml AORTIC VALVE AV Area (Vmax):    2.52 cm AV Area (Vmean):   1.19 cm AV Area (VTI):     2.01 cm AV Vmax:           185.00 cm/s AV Vmean:          240.500 cm/s AV VTI:            0.460 m AV Peak Grad:      13.7 mmHg AV Mean Grad:      8.0 mmHg LVOT Vmax:         112.00 cm/s LVOT Vmean:        69.150 cm/s LVOT VTI:          0.222 m LVOT/AV VTI ratio: 0.48  SHUNTS Systemic VTI:  0.22 m Systemic Diam: 2.30 cm Eleonore Chiquito MD Electronically signed by Eleonore Chiquito MD Signature Date/Time: 09/17/2021/12:20:43 PM    Final    Structural Heart Procedure  Result Date: 09/17/2021 See surgical note for result.  DG Chest 2 View per protocol  Result Date: 09/15/2021 CLINICAL DATA:  Preoperative chest radiograph prior  to TAVR EXAM: CHEST - 2 VIEW COMPARISON:  09/02/2021 FINDINGS: The cardiomediastinal silhouette is unremarkable. Monitor device overlying the LEFT chest is noted. There is no evidence of focal airspace disease, pulmonary edema,  suspicious pulmonary nodule/mass, pleural effusion, or pneumothorax. No acute bony abnormalities are identified. IMPRESSION: No active cardiopulmonary disease. Electronically Signed   By: Margarette Canada M.D.   On: 09/15/2021 09:10   DG Chest Port 1 View  Result Date: 09/02/2021 CLINICAL DATA:  Chest discomfort. EXAM: PORTABLE CHEST 1 VIEW COMPARISON:  CT 07/26/2021 FINDINGS: Normal cardiomediastinal contours. Decreased lung volumes. No pleural effusion or edema. No airspace opacities. The visualized osseous structures appear intact. IMPRESSION: 1. Low lung volumes. 2. No acute findings. Electronically Signed   By: Kerby Moors M.D.   On: 09/02/2021 07:19    Disposition   Patient was seen by Dr Debara Pickett today, deemed stable for discharge. New medication changes reviewed with the patient over the phone. Follow up arranged on 10/14/21. All question answered. Pt is being discharged home today in good condition.  Follow-up Plans & Appointments     Follow-up Information     Josue Hector, MD Follow up on 10/14/2021.   Specialty: Cardiology Why: at 1:15 PM for your cardiology appointment Contact information: [address] [place] [address] [phone]                Discharge Instructions     Amb referral to [facility]   Complete by: As directed    Diet - low sodium heart healthy   Complete by: As directed    Discharge instructions   Complete by: As directed    New medication: Eliquis  Stop aspirin  Increase Metoprolol to '25mg'$  twice daily   Follow up with cardiology on 10/14/21   Please monitor yourself for bleeding  Please monitor your heart rate and blood pressure, bring log to next appointment for review   Increase activity slowly   Complete by: As directed        Discharge Medications   Allergies as of 09/23/2021   No Known Allergies      Medication List     STOP taking these medications    aspirin EC 81 MG tablet   tamsulosin 0.4  MG Caps capsule Commonly known as: FLOMAX       TAKE these medications    acarbose 100 MG tablet Commonly known as: PRECOSE Take 100 mg by mouth 3 (three) times daily with meals.   Accu-Chek Guide test strip Generic drug: glucose blood   Accu-Chek Softclix Lancets lancets 1 each 3 (three) times daily.   alendronate 70 MG tablet Commonly known as: FOSAMAX Take 1 tablet (70 mg total) by mouth every 7 (seven) days. Take with a full glass of water on an empty stomach. What changed: additional instructions   alfuzosin 10 MG 24 hr tablet Commonly known as: UROXATRAL Take 1 tablet (10 mg total) by mouth daily with breakfast. What changed: when to take this   amLODipine 5 MG tablet Commonly known as: NORVASC TAKE 1 TABLET(5 MG) BY MOUTH DAILY What changed: See the new instructions.   apixaban 5 MG Tabs tablet Commonly known as: ELIQUIS Take 1 tablet (5 mg total) by mouth 2 (two) times daily.   Blue-Emu Hemp 10 % cream Generic drug: trolamine salicylate Apply 1 application topically as needed for muscle pain.   clopidogrel 75 MG tablet Commonly known as: Plavix Take 1 tablet (75 mg total) by  mouth daily.   CoQ10 100 MG Caps Take 100 mg by mouth daily.   cyanocobalamin 1000 MCG tablet Commonly known as: VITAMIN B12 Take 1,000 mcg by mouth daily.   Farxiga 10 MG Tabs tablet Generic drug: dapagliflozin propanediol Take 10 mg by mouth daily.   ferrous sulfate 325 (65 FE) MG EC tablet Take 1 tablet (325 mg total) by mouth daily with breakfast.   furosemide 20 MG tablet Commonly known as: LASIX Take 20 mg by mouth 2 (two) times daily.   glipiZIDE 10 MG tablet Commonly known as: GLUCOTROL Take 10 mg by mouth 2 (two) times daily.   HYDROcodone-acetaminophen 10-325 MG tablet Commonly known as: NORCO Take 1 tablet by mouth every 6 (six) hours as needed for moderate pain.   Levemir FlexTouch 100 UNIT/ML FlexPen Generic drug: insulin detemir Inject 90 Units into  the skin at bedtime.   lisinopril 20 MG tablet Commonly known as: ZESTRIL Take 1 tablet (20 mg total) by mouth daily.   Magnesium 400 MG Tabs Take 400 mg by mouth daily.   metFORMIN 500 MG 24 hr tablet Commonly known as: GLUCOPHAGE-XR Take 500 mg by mouth every evening. Take with 1000 Metformin   metFORMIN 1000 MG tablet Commonly known as: GLUCOPHAGE Take 1,000 mg by mouth 2 (two) times daily.   metoprolol tartrate 25 MG tablet Commonly known as: LOPRESSOR Take 1 tablet (25 mg total) by mouth 2 (two) times daily. What changed: when to take this   Nac 600 600 MG Caps Generic drug: Acetylcysteine Take 600 mg by mouth daily.   nitroGLYCERIN 0.4 MG SL tablet Commonly known as: NITROSTAT Place 1 tablet (0.4 mg total) under the tongue every 5 (five) minutes x 3 doses as needed for chest pain (if no relief after 2nd dose, proceed to the ED for an evalution or call 911).   pantoprazole 40 MG tablet Commonly known as: PROTONIX Take 40 mg by mouth every evening.   potassium citrate 5 MEQ (540 MG) SR tablet Commonly known as: UROCIT-K Take 5 mEq by mouth 3 (three) times daily with meals.   Quercetin 500 MG Caps Take 1,000 mg by mouth daily.   rosuvastatin 20 MG tablet Commonly known as: CRESTOR Take 20 mg by mouth every evening.   vitamin C 1000 MG tablet Take 1,000 mg by mouth every morning.   Vitamin D3 125 MCG (5000 UT) Tabs Take 5,000 Units by mouth daily.   Vitamin K2 100 MCG Tabs Take 100 mcg by mouth daily.   zinc gluconate 50 MG tablet Take 50 mg by mouth daily.           Outstanding Labs/Studies   N/A  Duration of Discharge Encounter   Greater than 30 minutes including physician time.  Signed, Margie Billet, NP 09/23/2021, 3:14 PM

## 2021-09-23 NOTE — Progress Notes (Signed)
DAILY PROGRESS NOTE   Patient Name: William [name] Date of Encounter: 09/23/2021 Cardiologist: [name], MD  Chief Complaint   No chest pain  Patient Profile   William [name] is a 76 y.o. 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  Subjective   No further chest pain. Maintaining sinus rhythm.  Echo performed today- briefly evaluated at bedside, appears unchanged compared to prior echo. Ambulating with cardiac rehab now.  Objective   Vitals:   09/22/21 1917 09/22/21 2329 09/23/21 0432 09/23/21 0738  BP: (!) 137/58 (!) 145/66 (!) 120/50 (!) 141/54  Pulse: 78 79 73 79  Resp: '18 17 16 17  '$ Temp: 98.1 F (36.7 C) 98 F (36.7 C) 98 F (36.7 C) 97.7 F (36.5 C)  TempSrc: Oral Oral Oral Oral  SpO2: 99% 98% 95% 97%  Weight:      Height:        Intake/Output Summary (Last 24 hours) at 09/23/2021 0940 Last data filed at 09/23/2021 0814 Gross per 24 hour  Intake 250 ml  Output 1600 ml  Net -1350 ml   Filed Weights   09/22/21 0121  Weight: 104.3 kg    Physical Exam   General appearance: alert and no distress Lungs: clear to auscultation bilaterally Heart: regular rate and rhythm, S1, S2 normal, and systolic murmur: systolic ejection 2/6, crescendo at 2nd right intercostal space Extremities: extremities normal, atraumatic, no cyanosis or edema Neurologic: Grossly normal  Inpatient Medications    Scheduled Meds:  alfuzosin  10 mg Oral QPM   amLODipine  5 mg Oral Daily   vitamin C  1,000 mg Oral q morning   aspirin EC  81 mg Oral Daily   cholecalciferol  400 Units Oral Daily   clopidogrel  75 mg Oral Daily   cyanocobalamin  1,000 mcg Oral Daily   ferrous sulfate   325 mg Oral Q breakfast   furosemide  20 mg Oral BID   insulin aspart  0-20 Units Subcutaneous TID WC   insulin aspart  0-5 Units Subcutaneous QHS   insulin aspart  2 Units Subcutaneous TID WC   lisinopril  20 mg Oral Daily   magnesium chloride  64 mg Oral Daily   metoprolol tartrate  25 mg Oral BID   pantoprazole  40 mg Oral QPM   rosuvastatin  20 mg Oral QPM   tamsulosin  0.4 mg Oral QPC supper   zinc sulfate  220 mg Oral Daily    Continuous Infusions:  heparin 1,350 Units/hr (09/22/21 1724)    PRN Meds: acetaminophen, ondansetron (ZOFRAN) IV, trolamine salicylate   Labs   Results for orders placed or performed during the hospital encounter of 09/22/21 (from the past 48 hour(s))  Basic metabolic panel     Status: Abnormal   Collection Time: 09/22/21  2:00 AM  Result Value Ref Range   Sodium 141 135 - 145 mmol/L   Potassium 4.3 3.5 - 5.1 mmol/L   Chloride 110 98 - 111 mmol/L   CO2 22 22 - 32 mmol/L   Glucose, Bld 243 (H) 70 - 99 mg/dL    Comment: Glucose reference range applies only to samples taken after  fasting for at least 8 hours.   BUN 34 (H) 8 - 23 mg/dL   Creatinine, Ser 1.41 (H) 0.61 - 1.24 mg/dL   Calcium 10.3 8.9 - 10.3 mg/dL   GFR, Estimated 52 (L) >60 mL/min    Comment: (NOTE) Calculated using the CKD-EPI Creatinine Equation (2021)    Anion gap 9 5 - 15    Comment: Performed at [facility] [address] [place], [place] [address]  Magnesium     Status: Abnormal   Collection Time: 09/22/21  2:00 AM  Result Value Ref Range   Magnesium 1.5 (L) 1.7 - 2.4 mg/dL    Comment: Performed at [facility] [address] [place], [place] [address]  CBC     Status: Abnormal   Collection Time: 09/22/21  2:00 AM  Result Value Ref Range   WBC 9.9 4.0 - 10.5 K/uL   RBC 3.44 (L) 4.22 - 5.81 MIL/uL   Hemoglobin 10.3 (L) 13.0 - 17.0 g/dL   HCT 31.7 (L) 39.0 - 52.0 %   MCV 92.2 80.0 - 100.0 fL   MCH 29.9 26.0 - 34.0 pg   MCHC 32.5 30.0 - 36.0 g/dL    RDW 16.1 (H) 11.5 - 15.5 %   Platelets 145 (L) 150 - 400 K/uL   nRBC 0.0 0.0 - 0.2 %    Comment: Performed at [facility] [address] [place], [place] [address]  Troponin I (High Sensitivity)     Status: Abnormal   Collection Time: 09/22/21  2:00 AM  Result Value Ref Range   Troponin I (High Sensitivity) 110 (HH) <18 ng/L    Comment: CRITICAL RESULT CALLED TO, READ BACK BY AND VERIFIED WITH C.CHRISCO, RN [phone] 09.17.23 MRIVET (NOTE) Elevated high sensitivity troponin I (hsTnI) values and significant  changes across serial measurements may suggest ACS but many other  chronic and acute conditions are known to elevate hsTnI results.  Refer to the "Links" section for chest pain algorithms and additional  guidance. Performed at [facility], [place] [address], [place], [place] [address]   Troponin I (High Sensitivity)     Status: Abnormal   Collection Time: 09/22/21  3:53 AM  Result Value Ref Range   Troponin I (High Sensitivity) 792 (HH) <18 ng/L    Comment: CRITICAL VALUE NOTED. VALUE IS CONSISTENT WITH PREVIOUSLY REPORTED/CALLED VALUE (NOTE) Elevated high sensitivity troponin I (hsTnI) values and significant  changes across serial measurements may suggest ACS but many other  chronic and acute conditions are known to elevate hsTnI results.  Refer to the "Links" section for chest pain algorithms and additional  guidance. Performed at [facility], [place] [address] [place],  [address]   CBG monitoring, ED     Status: Abnormal   Collection Time: 09/22/21  7:46 AM  Result Value Ref Range   Glucose-Capillary 170 (H) 70 - 99 mg/dL    Comment: Glucose reference range applies only to samples taken after fasting for at least 8 hours.  CBG monitoring, ED     Status: Abnormal   Collection Time: 09/22/21 11:53 AM  Result Value Ref Range   Glucose-Capillary 174 (H) 70 - 99 mg/dL    Comment: Glucose reference range applies only to samples taken after fasting for at  least 8 hours.  Magnesium     Status: None   Collection Time: 09/22/21  4:11 PM  Result Value Ref Range   Magnesium 1.7 1.7 -  2.4 mg/dL    Comment: Performed at [facility], [place] [address] [place], [place] [address]  Heparin level (unfractionated)     Status: None   Collection Time: 09/22/21  4:11 PM  Result Value Ref Range   Heparin Unfractionated 0.33 0.30 - 0.70 IU/mL    Comment: (NOTE) The clinical reportable range upper limit is being lowered to >1.10 to align with the FDA approved guidance for the current laboratory assay.  If heparin results are below expected values, and patient dosage has  been confirmed, suggest follow up testing of antithrombin III levels. Performed at [facility], [place] [address], [place], [place] [address]   CBG monitoring, ED     Status: Abnormal   Collection Time: 09/22/21  4:35 PM  Result Value Ref Range   Glucose-Capillary 180 (H) 70 - 99 mg/dL    Comment: Glucose reference range applies only to samples taken after fasting for at least 8 hours.  Heparin level (unfractionated)     Status: None   Collection Time: 09/23/21  3:22 AM  Result Value Ref Range   Heparin Unfractionated 0.34 0.30 - 0.70 IU/mL    Comment: (NOTE) The clinical reportable range upper limit is being lowered to >1.10 to align with the FDA approved guidance for the current laboratory assay.  If heparin results are below expected values, and patient dosage has  been confirmed, suggest follow up testing of antithrombin III levels. Performed at [facility], [place] [address], [place], [place] [address]   CBC     Status: Abnormal   Collection Time: 09/23/21  3:22 AM  Result Value Ref Range   WBC 7.7 4.0 - 10.5 K/uL   RBC 3.52 (L) 4.22 - 5.81 MIL/uL   Hemoglobin 10.4 (L) 13.0 - 17.0 g/dL   HCT 31.7 (L) 39.0 - 52.0 %   MCV 90.1 80.0 - 100.0 fL   MCH 29.5 26.0 - 34.0 pg   MCHC 32.8 30.0 - 36.0 g/dL   RDW 15.9 (H) 11.5 - 15.5 %   Platelets 148 (L) 150 - 400  K/uL   nRBC 0.0 0.0 - 0.2 %    Comment: Performed at [facility], [place] [address], [place], [place] [address]  Glucose, capillary     Status: Abnormal   Collection Time: 09/23/21  8:13 AM  Result Value Ref Range   Glucose-Capillary 187 (H) 70 - 99 mg/dL    Comment: Glucose reference range applies only to samples taken after fasting for at least 8 hours.    ECG   N/A  Telemetry   Sinus rhythm - Personally Reviewed  Radiology    DG Chest Portable 1 View  Result Date: 09/22/2021 CLINICAL DATA:  Atrial fibrillation with chest pain. EXAM: PORTABLE CHEST 1 VIEW COMPARISON:  Chest x-ray 09/13/2021 FINDINGS: The heart is enlarged. There is no focal lung infiltrate, pleural effusion or pneumothorax. Generator overlies the left chest, unchanged. No acute fractures are seen. IMPRESSION: 1. Cardiomegaly. 2. No other acute cardiopulmonary process. Electronically Signed   By: Ronney Asters M.D.   On: 09/22/2021 01:58    Cardiac Studies   Echo pending  Assessment   Principal Problem:   Atrial fibrillation ([place]) Active Problems:   DM (diabetes mellitus) ([place])   HTN (hypertension)   CKD stage 3 due to type 2 diabetes mellitus ([place])   S/P TAVR (transcatheter aortic valve replacement)   CAD S/P percutaneous coronary angioplasty   LBBB (left bundle branch  block)   Plan   No further chest pain or afib. Formal echo read pending. He is ambulating now. Would anticipate switch from IV heparin to Eliquis + Plavix, but no aspirin. Possible d/c later today. He is wearing a Zio patch.   Time Spent Directly with Patient:  I have spent a total of 25 minutes with the patient reviewing hospital notes, telemetry, EKGs, labs and examining the patient as well as establishing an assessment and plan that was discussed personally with the patient.  > 50% of time was spent in direct patient care.  Length of Stay:  LOS: 0 days   William Casino, MD, [facility], [place] Director of the Advanced Lipid Disorders &  Cardiovascular Risk Reduction Clinic Diplomate of the American Board of Clinical Lipidology Attending Cardiologist  Direct Dial: [phone]  Fax: [phone]  Website:  [email or web address] 09/23/2021, 9:40 AM

## 2021-09-23 NOTE — Telephone Encounter (Signed)
   Cardiac Monitor Alert  Date of alert:  09/23/2021   Patient Name: William [name]  DOB: [birth year]  MRN: [id number]   [facility] Cardiologist: [name], MD  [facility] EP:  None    Monitor Information: Long Term Monitor-Live Telemetry [ZioAT]  Reason:  S/P TAVR (transcatheter aortic valve replacement, LBBB Ordering provider:  Angelena Form, PA {  Alert Atrial Fibrillation/Flutter This is the 1st alert for this rhythm.  The patient has a hx of Atrial Fibrillation/Flutter.   Next Cardiology Appointment   Date:  09/25/21  Provider:  Structural Heart team  Patient is currently admitted to the hospital   Tor Netters, RN  09/23/2021 10:32 AM

## 2021-09-25 ENCOUNTER — Ambulatory Visit: Payer: Medicare HMO

## 2021-09-27 DIAGNOSIS — I480 Paroxysmal atrial fibrillation: Secondary | ICD-10-CM | POA: Diagnosis not present

## 2021-09-27 DIAGNOSIS — Z6835 Body mass index (BMI) 35.0-35.9, adult: Secondary | ICD-10-CM | POA: Diagnosis not present

## 2021-09-27 DIAGNOSIS — M1991 Primary osteoarthritis, unspecified site: Secondary | ICD-10-CM | POA: Diagnosis not present

## 2021-09-27 DIAGNOSIS — N183 Chronic kidney disease, stage 3 unspecified: Secondary | ICD-10-CM | POA: Diagnosis not present

## 2021-09-27 DIAGNOSIS — E1322 Other specified diabetes mellitus with diabetic chronic kidney disease: Secondary | ICD-10-CM | POA: Diagnosis not present

## 2021-09-27 DIAGNOSIS — Z23 Encounter for immunization: Secondary | ICD-10-CM | POA: Diagnosis not present

## 2021-09-30 ENCOUNTER — Telehealth: Payer: Self-pay | Admitting: Cardiovascular Disease

## 2021-09-30 NOTE — Telephone Encounter (Signed)
Spoke to pt who stated that he talked to [facility] who will mail him a new monitor to wear. Informed pt to send current monitor in when he receives new monitor. Pt verbalized understanding.

## 2021-09-30 NOTE — Telephone Encounter (Signed)
Patient stated the battery in his heart monitor will need to be replaced.  Patient stated he needs to know if he needs to put on the new monitor before he comes in for his appointment on 10/9.

## 2021-10-04 NOTE — Progress Notes (Signed)
CARDIOLOGY CONSULT NOTE       Patient ID: William [name] MRN: [id number] DOB/AGE: July 18, 1945 76 y.o.  Primary Physician: [name], MD Primary Cardiologist: Johnsie Cancel   HPI:  76 y.o. first seen by me 01/04/21 for aortic stenosis. History of HTN, CAD with DES to RCA in 2015. TTE 06/19/21 progression to severe AS with mean gradient 41 peak 66 mmHg AVA 0.62. 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 s/p DES to distal RCA in 08/2013, DES to LAD 07/2021   Cancer [facility])    prostate   CKD (chronic kidney disease)    Diabetes mellitus without complication ([place])    Family history of colon cancer    GERD (gastroesophageal reflux disease)    Heart murmur    Hypercalcemia    Hypercholesteremia    Hypertension    Kidney stone    PAF (paroxysmal atrial fibrillation) (HCC)    Personal history of colonic polyps    Pneumonia    S/P TAVR (transcatheter aortic valve replacement) 09/17/2021   s/p TAVR with a 23 mm Edwards S3UR via the TF approach by Dr. Angelena Form & Bartle    Family History  Problem Relation Age of Onset   Diabetes  Mother    Heart attack Mother 49   Pulmonary embolism Father    Colon cancer Brother 54       Passed age 44 from colon ca   Gastric cancer Neg Hx    Esophageal cancer Neg Hx     Social History   Socioeconomic History   Marital status: Widowed    Spouse name: Not on file   Number of children: 5   Years of education: Not on file   Highest education level: Not on file  Occupational History   Occupation: Retired Archivist  Tobacco Use   Smoking status: Former    Packs/day: 3.00    Years: 45.00    Total pack years: 135.00    Types: Cigarettes    Start date: 01/06/1962    Quit date: 10/24/2004    Years since quitting: 16.9   Smokeless tobacco: Former    Quit date: 08/10/2005  Vaping Use   Vaping Use: Never used  Substance and Sexual Activity   Alcohol use: Not Currently    Comment: occasional   Drug use: No   Sexual activity: Not on file  Other Topics Concern   Not on file  Social History Narrative   Not on file   Social Determinants of Health  Financial Resource Strain: Not on file  Food Insecurity: Not on file  Transportation Needs: Not on file  Physical Activity: Not on file  Stress: Not on file  Social Connections: Not on file  Intimate Partner Violence: Not on file    Past Surgical History:  Procedure Laterality Date   APPENDECTOMY     BIOPSY  11/03/2019   Benign gastric mucosa with reactive changes and focal inflammation   BIOPSY  03/11/2021   Procedure: BIOPSY;  Surgeon: [name], DO;  [facility];  Service: Endoscopy;;   COLONOSCOPY WITH PROPOFOL N/A 02/15/2018   12 polyps ranging in 5 to 20 mm in size were tubular adenoma and recommended repeat exam in 2023   COLONOSCOPY WITH PROPOFOL N/A 03/11/2021   Procedure: COLONOSCOPY WITH PROPOFOL;  Surgeon: [name], DO;  [facility];  Service: Endoscopy;  Laterality: N/A;  9:15am   CORONARY STENT INTERVENTION N/A 07/15/2021   Procedure: CORONARY STENT INTERVENTION;   Surgeon: [name], MD;  Location: [place] [facility];  Service: Cardiovascular;  Laterality: N/A;   CORONARY STENT PLACEMENT  08/10/2013   ESOPHAGOGASTRODUODENOSCOPY (EGD) WITH PROPOFOL N/A 11/03/2019   normal esophagus, small hiatal hernia, diffuse erythematous mucosa in the entire stomach with scattered erosions.  Status post gastric biopsies for histology.  Surgical pathology found the biopsies to be benign gastric mucosa with reactive changes and focal inflammation, negative for H. Pylori.   FLEXIBLE SIGMOIDOSCOPY N/A 11/03/2019   attempted colonoscopy but inadequate prep   gsw to abd     INTRAOPERATIVE TRANSTHORACIC ECHOCARDIOGRAM N/A 09/17/2021   Procedure: INTRAOPERATIVE TRANSTHORACIC ECHOCARDIOGRAM;  Surgeon: [name], MD;  Location: [place] [facility];  Service: Open Heart Surgery;  Laterality: N/A;   INTRAVASCULAR LITHOTRIPSY  07/15/2021   Procedure: INTRAVASCULAR LITHOTRIPSY;  Surgeon: [name], MD;  Location: [place] [facility];  Service: Cardiovascular;;   LEFT HEART CATHETERIZATION WITH CORONARY ANGIOGRAM N/A 08/10/2013   Procedure: LEFT HEART CATHETERIZATION WITH CORONARY ANGIOGRAM;  Surgeon: [name], MD;  Location: [place] [facility];  Service: Cardiovascular;  Laterality: N/A;   MULTIPLE EXTRACTIONS WITH ALVEOLOPLASTY N/A 08/22/2021   Procedure: MULTIPLE EXTRACTION WITH ALVEOLOPLASTY;  Surgeon: [name], [name];  Location: [place];  Service: Dentistry;  Laterality: N/A;   POLYPECTOMY  02/15/2018   Procedure: POLYPECTOMY;  Surgeon: [name], MD;  [facility];  Service: Endoscopy;;  colon   POLYPECTOMY  03/11/2021   Procedure: POLYPECTOMY;  Surgeon: [name], DO;  [facility];  Service: Endoscopy;;   RIGHT/LEFT HEART CATH AND CORONARY ANGIOGRAPHY N/A 07/15/2021   Procedure: RIGHT/LEFT HEART CATH AND CORONARY ANGIOGRAPHY;  Surgeon: [name], MD;  Location: [place] [facility];   Service: Cardiovascular;  Laterality: N/A;   TRANSCATHETER AORTIC VALVE REPLACEMENT, TRANSFEMORAL N/A 09/17/2021   Procedure: Transcatheter Aortic Valve Replacement, Transfemoral;  Surgeon: [name], MD;  Location: [place] [facility];  Service: Open Heart Surgery;  Laterality: N/A;      Current Outpatient Medications:    acarbose (PRECOSE) 100 MG tablet, Take 100 mg by mouth 3 (three) times daily with meals. , Disp: , Rfl:    ACCU-CHEK GUIDE test strip, , Disp: , Rfl:    Accu-Chek Softclix Lancets lancets, 1 each 3 (three) times daily., Disp: , Rfl:    Acetylcysteine (NAC 600) 600 MG CAPS, Take 600 mg by mouth daily., Disp: , Rfl:    alendronate (FOSAMAX) 70 MG  tablet, Take 1 tablet (70 mg total) by mouth every 7 (seven) days. Take with a full glass of water on an empty stomach. (Patient taking differently: Take 70 mg by mouth every 7 (seven) days. Take with a full glass of water on an empty stomach. Taken [employer]), Disp: 4 tablet, Rfl: 11   alfuzosin (UROXATRAL) 10 MG 24 hr tablet, Take 1 tablet (10 mg total) by mouth daily with breakfast. (Patient taking differently: Take 10 mg by mouth every evening.), Disp: 30 tablet, Rfl: 11   amLODipine (NORVASC) 5 MG tablet, TAKE 1 TABLET(5 MG) BY MOUTH DAILY (Patient taking differently: Take 5 mg by mouth daily.), Disp: 90 tablet, Rfl: 0   apixaban (ELIQUIS) 5 MG TABS tablet, Take 1 tablet (5 mg total) by mouth 2 (two) times daily., Disp: 60 tablet, Rfl: 2   Ascorbic Acid (VITAMIN C) 1000 MG tablet, Take 1,000 mg by mouth every morning., Disp: , Rfl:    Cholecalciferol (VITAMIN D3) 125 MCG (5000 UT) TABS, Take 5,000 Units by mouth daily., Disp: , Rfl:    clopidogrel (PLAVIX) 75 MG tablet, Take 1 tablet (75 mg total) by mouth daily., Disp: 90 tablet, Rfl: 3   Coenzyme Q10 (COQ10) 100 MG CAPS, Take 100 mg by mouth daily., Disp: , Rfl:    FARXIGA 10 MG TABS tablet, Take 10 mg by mouth daily. , Disp: , Rfl:    ferrous sulfate 325 (65 FE)  MG EC tablet, Take 1 tablet (325 mg total) by mouth daily with breakfast., Disp: 30 tablet, Rfl: 3   furosemide (LASIX) 20 MG tablet, Take 20 mg by mouth 2 (two) times daily. , Disp: , Rfl: 0   glipiZIDE (GLUCOTROL) 10 MG tablet, Take 10 mg by mouth 2 (two) times daily., Disp: , Rfl:    HYDROcodone-acetaminophen (NORCO) 10-325 MG tablet, Take 1 tablet by mouth every 6 (six) hours as needed for moderate pain., Disp: , Rfl:    LEVEMIR FLEXTOUCH 100 UNIT/ML Pen, Inject 90 Units into the skin at bedtime. , Disp: , Rfl:    lisinopril (ZESTRIL) 20 MG tablet, Take 1 tablet (20 mg total) by mouth daily., Disp: 90 tablet, Rfl: 1   Magnesium 400 MG TABS, Take 400 mg by mouth daily., Disp: , Rfl:    Menatetrenone (VITAMIN K2) 100 MCG TABS, Take 100 mcg by mouth daily., Disp: , Rfl:    metFORMIN (GLUCOPHAGE) 1000 MG tablet, Take 1,000 mg by mouth 2 (two) times daily., Disp: , Rfl: 3   metFORMIN (GLUCOPHAGE-XR) 500 MG 24 hr tablet, Take 500 mg by mouth every evening. Take with 1000 Metformin, Disp: , Rfl:    metoprolol tartrate (LOPRESSOR) 25 MG tablet, Take 1 tablet (25 mg total) by mouth 2 (two) times daily., Disp: 60 tablet, Rfl: 2   nitroGLYCERIN (NITROSTAT) 0.4 MG SL tablet, Place 1 tablet (0.4 mg total) under the tongue every 5 (five) minutes x 3 doses as needed for chest pain (if no relief after 2nd dose, proceed to the ED for an evalution or call 911)., Disp: 75 tablet, Rfl: 2   pantoprazole (PROTONIX) 40 MG tablet, Take 40 mg by mouth every evening. , Disp: , Rfl:    potassium citrate (UROCIT-K) 5 MEQ (540 MG) SR tablet, Take 5 mEq by mouth 3 (three) times daily with meals., Disp: , Rfl:    Quercetin 500 MG CAPS, Take 1,000 mg by mouth daily., Disp: , Rfl:    rosuvastatin (CRESTOR) 20 MG tablet, Take 20 mg by mouth every  evening. , Disp: , Rfl:    trolamine salicylate (BLUE-EMU HEMP) 10 % cream, Apply 1 application topically as needed for muscle pain., Disp: , Rfl:    vitamin B-12 (CYANOCOBALAMIN) 1000  MCG tablet, Take 1,000 mcg by mouth daily., Disp: , Rfl:    zinc gluconate 50 MG tablet, Take 50 mg by mouth daily., Disp: , Rfl:     Physical Exam: There were no vitals taken for this visit.    Affect appropriate Elderly male  HEENT: normal Neck supple with no adenopathy JVP normal no bruits no thyromegaly Lungs clear with no wheezing and good diaphragmatic motion Heart:  S1/S2 SEM through TAVR valve  murmur, no rub, gallop or click PMI normal Abdomen: benighn, BS positve, no tenderness, no AAA no bruit.  No HSM or HJR Distal pulses intact with no bruits Trace edema some varicose veins  Neuro non-focal Skin warm and dry No muscular weakness   Labs:   Lab Results  Component Value Date   WBC 7.7 09/23/2021   HGB 10.4 (L) 09/23/2021   HCT 31.7 (L) 09/23/2021   MCV 90.1 09/23/2021   PLT 148 (L) 09/23/2021   No results for input(s): "NA", "K", "CL", "CO2", "BUN", "CREATININE", "CALCIUM", "PROT", "BILITOT", "ALKPHOS", "ALT", "AST", "GLUCOSE" in the last 168 hours.  Invalid input(s): "LABALBU" Lab Results  Component Value Date   CKTOTAL 182 10/27/2007   CKMB 2.8 10/27/2007   TROPONINI <0.03 08/18/2015    Lab Results  Component Value Date   CHOL  10/27/2007    122        ATP III CLASSIFICATION:  <200     mg/dL   Desirable  200-239  mg/dL   Borderline High  >=240    mg/dL   High   Lab Results  Component Value Date   HDL 39 (L) 10/27/2007   Lab Results  Component Value Date   LDLCALC  10/27/2007    68        Total Cholesterol/HDL:CHD Risk Coronary Heart Disease Risk Table                     Men   Women  1/2 Average Risk   3.4   3.3   Lab Results  Component Value Date   TRIG 76 10/27/2007   Lab Results  Component Value Date   CHOLHDL 3.1 10/27/2007   No results found for: "LDLDIRECT"    Radiology: LONG TERM MONITOR-LIVE TELEMETRY (3-14 DAYS)  Result Date: 09/25/2021 Patch Wear Time:  12 days and 15 hours (2023-08-31T18:13:36-0400 to  2023-09-13T10:13:28-0400) Patient had a min HR of 31 bpm, max HR of 214 bpm, and avg HR of 74 bpm. Predominant underlying rhythm was Sinus Rhythm. Intermittent Bundle Branch Block was present. QRS morphology changes were present throughout recording. 4 Ventricular Tachycardia runs occurred, the run with the fastest interval lasting 15.6 secs with a max rate of 214 bpm (avg 188 bpm); the run with the fastest interval was also the longest. 8 Supraventricular Tachycardia runs occurred, the run with the fastest interval lasting 4  beats with a max rate of 176 bpm, the longest lasting 10 beats with an avg rate of 135 bpm. Second Degree AV Block-Mobitz I (Wenckebach) was present. Isolated SVEs were rare (<1.0%), SVE Couplets were rare (<1.0%), and SVE Triplets were rare (<1.0%). Isolated VEs were rare (<1.0%, 6708), VE Couplets were rare (<1.0%, 38), and VE Triplets were rare (<1.0%, 8). Ventricular Bigeminy and Trigeminy were present. Previously Notified: MD  notification criteria for Ventricular Tachycardia met - multiple notification attempts made and left messages on 17 Sep 2021 (KT). [name] MD [facility]   ECHOCARDIOGRAM LIMITED  Result Date: 09/23/2021    ECHOCARDIOGRAM LIMITED REPORT   Patient Name:   William [name] Date of Exam: 09/23/2021 Medical Rec #:  [id number]     Height:       69.0 in Accession #:    [id number]    Weight:       230.0 lb Date of Birth:  08-May-1945     BSA:          2.192 m Patient Age:    61 years      BP:           141/54 mmHg Patient Gender: M             HR:           72 bpm. Exam Location:  Inpatient Procedure: Limited Color Doppler, Limited Echo and Cardiac Doppler Indications:    R07.9* Chest pain, unspecified  History:        Patient has prior history of Echocardiogram examinations, most                 recent 09/19/2021. CAD, Arrythmias:Atrial Fibrillation; Risk                 Factors:Hypertension, Diabetes and Dyslipidemia.                 Aortic Valve: 23 mm Edwards Sapien  prosthetic, stented (TAVR)                 valve is present in the aortic position. Procedure Date:                 09/17/21.  Sonographer:    Ronny Flurry Sonographer#2:  Raquel Sarna Senior Referring Phys: [id number] FAN YE  Sonographer Comments: Limited echo to assess for new WMAs or prosthetic dysfunction, full exam performed last 4 days prior. IMPRESSIONS  1. Left ventricular ejection fraction, by estimation, is 60 to 65%. The left ventricle has normal function. The left ventricle has no regional wall motion abnormalities.  2. Right ventricular systolic function is normal. The right ventricular size is normal.  3. The mitral valve is degenerative. Mild mitral valve regurgitation. No evidence of mitral stenosis. Moderate mitral annular calcification.  4. The aortic valve is normal in structure. Aortic valve regurgitation is not visualized. No aortic stenosis is present. There is a 23 mm Edwards Sapien prosthetic (TAVR) valve present in the aortic position. Procedure Date: 09/17/21. Aortic valve area, by VTI measures 1.68 cm. Aortic valve mean gradient measures 13.0 mmHg. Aortic valve Vmax measures 2.36 m/s.  5. The inferior vena cava is normal in size with greater than 50% respiratory variability, suggesting right atrial pressure of 3 mmHg. FINDINGS  Left Ventricle: Left ventricular ejection fraction, by estimation, is 60 to 65%. The left ventricle has normal function. The left ventricle has no regional wall motion abnormalities. The left ventricular internal cavity size was normal in size. There is  no left ventricular hypertrophy. Right Ventricle: The right ventricular size is normal. No increase in right ventricular wall thickness. Right ventricular systolic function is normal. Left Atrium: Left atrial size was normal in size. Right Atrium: Right atrial size was normal in size. Pericardium: There is no evidence of pericardial effusion. Mitral Valve: The mitral valve is degenerative in appearance. There is mild  calcification of the mitral valve leaflet(s).  Moderate mitral annular calcification. Mild mitral valve regurgitation. No evidence of mitral valve stenosis. Tricuspid Valve: The tricuspid valve is normal in structure. Tricuspid valve regurgitation is not demonstrated. No evidence of tricuspid stenosis. Aortic Valve: The aortic valve is normal in structure. Aortic valve regurgitation is not visualized. No aortic stenosis is present. Aortic valve mean gradient measures 13.0 mmHg. Aortic valve peak gradient measures 22.3 mmHg. Aortic valve area, by VTI measures 1.68 cm. There is a 23 mm Edwards Sapien prosthetic, stented (TAVR) valve present in the aortic position. Procedure Date: 09/17/21. Pulmonic Valve: The pulmonic valve was normal in structure. Pulmonic valve regurgitation is not visualized. No evidence of pulmonic stenosis. Aorta: The aortic root is normal in size and structure. Venous: The inferior vena cava is normal in size with greater than 50% respiratory variability, suggesting right atrial pressure of 3 mmHg. IAS/Shunts: No atrial level shunt detected by color flow Doppler. LEFT VENTRICLE PLAX 2D LVOT diam:     2.00 cm LV SV:         86 LV SV Index:   39 LVOT Area:     3.14 cm  AORTIC VALVE AV Area (Vmax):    1.69 cm AV Area (Vmean):   1.70 cm AV Area (VTI):     1.68 cm AV Vmax:           236.00 cm/s AV Vmean:          175.000 cm/s AV VTI:            0.509 m AV Peak Grad:      22.3 mmHg AV Mean Grad:      13.0 mmHg LVOT Vmax:         127.00 cm/s LVOT Vmean:        94.700 cm/s LVOT VTI:          0.273 m LVOT/AV VTI ratio: 0.54  SHUNTS Systemic VTI:  0.27 m Systemic Diam: 2.00 cm Fransico Him MD Electronically signed by Fransico Him MD Signature Date/Time: 09/23/2021/10:32:45 AM    Final    DG Chest Portable 1 View  Result Date: 09/22/2021 CLINICAL DATA:  Atrial fibrillation with chest pain. EXAM: PORTABLE CHEST 1 VIEW COMPARISON:  Chest x-ray 09/13/2021 FINDINGS: The heart is enlarged. There is no  focal lung infiltrate, pleural effusion or pneumothorax. Generator overlies the left chest, unchanged. No acute fractures are seen. IMPRESSION: 1. Cardiomegaly. 2. No other acute cardiopulmonary process. Electronically Signed   By: Ronney Asters M.D.   On: 09/22/2021 01:58   ECHOCARDIOGRAM COMPLETE  Result Date: 09/19/2021    ECHOCARDIOGRAM REPORT   Patient Name:   William [name] Date of Exam: 09/18/2021 Medical Rec #:  [id number]     Height:       69.0 in Accession #:    [id number]    Weight:       226.2 lb Date of Birth:  28-Oct-1945     BSA:          2.177 m Patient Age:    4 years      BP:           119/50 mmHg Patient Gender: M             HR:           82 bpm. Exam Location:  Inpatient Procedure: 3D Echo, 2D Echo, Cardiac Doppler and Color Doppler Indications:    I35.0 Nonrheumatic aortic (valve) stenosis  History:        Patient  has prior history of Echocardiogram examinations, most                 recent 09/17/2021. CHF, CAD and Previous Myocardial Infarction,                 Aortic Valve Disease, Signs/Symptoms:Chest Pain; Risk                 Factors:Former Smoker.                 Aortic Valve: 23 mm Sapien prosthetic, stented (TAVR) valve is                 present in the aortic position. Procedure Date: 09/17/2021.  Sonographer:    Roseanna Rainbow RDCS Referring Phys: [id number] [place]  Sonographer Comments: Post TAVR IMPRESSIONS  1. 23 mm S3 TAVR. Vmax 2.6 m/s, MG 17 mmHG, EOA 1.94 cm2, DI 0.47. No regurgitation or paravalvular leak. Gradient higher tha postop but within limits. The aortic valve has been repaired/replaced. Aortic valve regurgitation is not visualized. There is a  23 mm Sapien prosthetic (TAVR) valve present in the aortic position. Procedure Date: 09/17/2021.  2. Left ventricular ejection fraction, by estimation, is 65 to 70%. The left ventricle has normal function. The left ventricle has no regional wall motion abnormalities. There is moderate concentric left ventricular hypertrophy.  Left ventricular diastolic parameters are consistent with Grade I diastolic dysfunction (impaired relaxation).  3. Right ventricular systolic function is normal. The right ventricular size is normal. Tricuspid regurgitation signal is inadequate for assessing PA pressure.  4. The mitral valve is degenerative. Moderate mitral valve regurgitation. Mild mitral stenosis. The mean mitral valve gradient is 4.0 mmHg with average heart rate of 79 bpm.  5. The inferior vena cava is normal in size with greater than 50% respiratory variability, suggesting right atrial pressure of 3 mmHg. FINDINGS  Left Ventricle: Left ventricular ejection fraction, by estimation, is 65 to 70%. The left ventricle has normal function. The left ventricle has no regional wall motion abnormalities. The left ventricular internal cavity size was normal in size. There is  moderate concentric left ventricular hypertrophy. Left ventricular diastolic parameters are consistent with Grade I diastolic dysfunction (impaired relaxation). Right Ventricle: The right ventricular size is normal. No increase in right ventricular wall thickness. Right ventricular systolic function is normal. Tricuspid regurgitation signal is inadequate for assessing PA pressure. Left Atrium: Left atrial size was normal in size. Right Atrium: Right atrial size was normal in size. Pericardium: Trivial pericardial effusion is present. Presence of epicardial fat layer. Mitral Valve: The mitral valve is degenerative in appearance. Mild to moderate mitral annular calcification. Moderate mitral valve regurgitation. Mild mitral valve stenosis. MV peak gradient, 10.8 mmHg. The mean mitral valve gradient is 4.0 mmHg with average heart rate of 79 bpm. Tricuspid Valve: The tricuspid valve is grossly normal. Tricuspid valve regurgitation is not demonstrated. No evidence of tricuspid stenosis. Aortic Valve: 23 mm S3 TAVR. Vmax 2.6 m/s, MG 17 mmHG, EOA 1.94 cm2, DI 0.47. No regurgitation or  paravalvular leak. Gradient higher tha postop but within limits. The aortic valve has been repaired/replaced. Aortic valve regurgitation is not visualized. Aortic valve mean gradient measures 17.0 mmHg. Aortic valve peak gradient measures 27.3 mmHg. Aortic valve area, by VTI measures 1.94 cm. There is a 23 mm Sapien prosthetic, stented (TAVR) valve present in the aortic position. Procedure Date: 09/17/2021. Pulmonic Valve: The pulmonic valve was grossly normal. Pulmonic valve regurgitation is not visualized.  No evidence of pulmonic stenosis. Aorta: The aortic root and ascending aorta are structurally normal, with no evidence of dilitation. Venous: The inferior vena cava is normal in size with greater than 50% respiratory variability, suggesting right atrial pressure of 3 mmHg. IAS/Shunts: The atrial septum is grossly normal.  LEFT VENTRICLE PLAX 2D LVIDd:         3.80 cm     Diastology LVIDs:         2.60 cm     LV e' medial:    4.90 cm/s LV PW:         1.90 cm     LV E/e' medial:  23.1 LV IVS:        1.60 cm     LV e' lateral:   4.90 cm/s LVOT diam:     2.30 cm     LV E/e' lateral: 23.1 LV SV:         96 LV SV Index:   44 LVOT Area:     4.15 cm                             3D Volume EF: LV Volumes (MOD)           3D EF:        62 % LV vol d, MOD A2C: 80.8 ml LV EDV:       202 ml LV vol d, MOD A4C: 88.7 ml LV ESV:       76 ml LV vol s, MOD A2C: 30.6 ml LV SV:        126 ml LV vol s, MOD A4C: 32.6 ml LV SV MOD A2C:     50.2 ml LV SV MOD A4C:     88.7 ml LV SV MOD BP:      53.7 ml RIGHT VENTRICLE             IVC RV S prime:     11.30 cm/s  IVC diam: 1.80 cm TAPSE (M-mode): 2.3 cm LEFT ATRIUM             Index        RIGHT ATRIUM           Index LA diam:        3.70 cm 1.70 cm/m   RA Area:     11.20 cm LA Vol (A2C):   47.3 ml 21.73 ml/m  RA Volume:   22.60 ml  10.38 ml/m LA Vol (A4C):   73.0 ml 33.53 ml/m LA Biplane Vol: 61.1 ml 28.07 ml/m  AORTIC VALVE AV Area (Vmax):    2.23 cm AV Area (Vmean):   2.12 cm AV  Area (VTI):     1.94 cm AV Vmax:           261.33 cm/s AV Vmean:          175.667 cm/s AV VTI:            0.496 m AV Peak Grad:      27.3 mmHg AV Mean Grad:      17.0 mmHg LVOT Vmax:         140.00 cm/s LVOT Vmean:        89.700 cm/s LVOT VTI:          0.232 m LVOT/AV VTI ratio: 0.47  AORTA Ao Root diam: 3.80 cm Ao Asc diam:  3.20 cm MITRAL VALVE MV Area (PHT): 3.10 cm  SHUNTS MV Area VTI:   2.28 cm     Systemic VTI:  0.23 m MV Peak grad:  10.8 mmHg    Systemic Diam: 2.30 cm MV Mean grad:  4.0 mmHg MV Vmax:       1.64 m/s MV Vmean:      102.0 cm/s MV Decel Time: 245 msec MR PISA:        1.01 cm MR PISA Radius: 0.40 cm MV E velocity: 113.00 cm/s MV A velocity: 144.50 cm/s MV E/A ratio:  0.78 Eleonore Chiquito MD Electronically signed by Eleonore Chiquito MD Signature Date/Time: 09/19/2021/10:10:36 AM    Final    ECHOCARDIOGRAM LIMITED  Result Date: 09/17/2021    ECHOCARDIOGRAM LIMITED REPORT   Patient Name:   William [name] Date of Exam: 09/17/2021 Medical Rec #:  [id number]     Height:       69.0 in Accession #:    [id number]    Weight:       227.0 lb Date of Birth:  1945/09/06     BSA:          2.180 m Patient Age:    17 years      BP:           140/72 mmHg Patient Gender: M             HR:           63 bpm. Exam Location:  Inpatient Procedure: Limited Echo, Cardiac Doppler and Color Doppler Indications:     I35.0 Nonrheumatic aortic (valve) stenosis  History:         Patient has prior history of Echocardiogram examinations, most                  recent 06/19/2021. CHF, CAD and Previous Myocardial Infarction,                  Aortic Valve Disease, Signs/Symptoms:Chest Pain; Risk                  Factors:Former Smoker. Severe aortic stenosis.                  Aortic Valve: 23 mm Sapien prosthetic, stented (TAVR) valve is                  present in the aortic position. Procedure Date: 09/17/2021.  Sonographer:     Roseanna Rainbow RDCS Referring Phys:  [name] Diagnosing Phys: Eleonore Chiquito MD IMPRESSIONS  1.  Echo guided TAVR. 23 mm S3. No regurgitation or paravalvular leak. Vmax 1.9 m/s, MG 8.0 mmHG, EOA 2.01 cm2, DI 0.48. No immediate complications. The aortic valve has been repaired/replaced. Aortic valve regurgitation is not visualized. There is a 23 mm Sapien prosthetic (TAVR) valve present in the aortic position. Procedure Date: 09/17/2021.  2. Left ventricular ejection fraction, by estimation, is 60 to 65%. The left ventricle has normal function. The left ventricle has no regional wall motion abnormalities.  3. Right ventricular systolic function is normal. The right ventricular size is normal.  4. The mitral valve is degenerative. Mild mitral valve regurgitation. No evidence of mitral stenosis. FINDINGS  Left Ventricle: Left ventricular ejection fraction, by estimation, is 60 to 65%. The left ventricle has normal function. The left ventricle has no regional wall motion abnormalities. Right Ventricle: The right ventricular size is normal. No increase in right ventricular wall thickness. Right ventricular systolic function is normal. Pericardium: There is no evidence of pericardial  effusion. Mitral Valve: The mitral valve is degenerative in appearance. Mild to moderate mitral annular calcification. Mild mitral valve regurgitation. No evidence of mitral valve stenosis. Tricuspid Valve: The tricuspid valve is grossly normal. Tricuspid valve regurgitation is trivial. Aortic Valve: Echo guided TAVR. 23 mm S3. No regurgitation or paravalvular leak. Vmax 1.9 m/s, MG 8.0 mmHG, EOA 2.01 cm2, DI 0.48. No immediate complications. The aortic valve has been repaired/replaced. Aortic valve regurgitation is not visualized. Aortic valve mean gradient measures 8.0 mmHg. Aortic valve peak gradient measures 13.7 mmHg. Aortic valve area, by VTI measures 2.01 cm. There is a 23 mm Sapien prosthetic, stented (TAVR) valve present in the aortic position. Procedure Date: 09/17/2021. Aorta: The aortic root and ascending aorta are  structurally normal, with no evidence of dilitation. LEFT VENTRICLE PLAX 2D LVOT diam:     2.30 cm LV SV:         92 LV SV Index:   42 LVOT Area:     4.15 cm  LV Volumes (MOD) LV vol d, MOD A2C: 105.0 ml LV vol d, MOD A4C: 100.0 ml LV vol s, MOD A2C: 29.3 ml LV vol s, MOD A4C: 39.1 ml LV SV MOD A2C:     75.8 ml LV SV MOD A4C:     100.0 ml LV SV MOD BP:      71.0 ml AORTIC VALVE AV Area (Vmax):    2.52 cm AV Area (Vmean):   1.19 cm AV Area (VTI):     2.01 cm AV Vmax:           185.00 cm/s AV Vmean:          240.500 cm/s AV VTI:            0.460 m AV Peak Grad:      13.7 mmHg AV Mean Grad:      8.0 mmHg LVOT Vmax:         112.00 cm/s LVOT Vmean:        69.150 cm/s LVOT VTI:          0.222 m LVOT/AV VTI ratio: 0.48  SHUNTS Systemic VTI:  0.22 m Systemic Diam: 2.30 cm Eleonore Chiquito MD Electronically signed by Eleonore Chiquito MD Signature Date/Time: 09/17/2021/12:20:43 PM    Final    Structural Heart Procedure  Result Date: 09/17/2021 See surgical note for result.   EKG: Afib rate 118 LBBB 09/23/21    ASSESSMENT AND PLAN:   CAD: Old stent to distal RCA. Pre TAVR cath had new stent to mid LAD on plavix only due to need for anticoagulation stable TAVR:  23 mm Sapien 3 valve implant 09/17/21 post implant TTE with mean gradient 13 mmHg no PVL. Will need 1 month echo rescheduled for 10/17. He has NYHA class II symptoms.  LBBB:  Zio with some Wenkebach no high grade AV block or bradycardia monitor closely as he is onlow dose beta blocker for his PAF PAF:  start low dose amiodarone 200 mg daily on low dose lopressor continue eliquis  HLD:  continue crestor DM:  Discussed low carb diet.  Target hemoglobin A1c is 6.5 or less.  Continue current medications. Thyroid nodule: noted on pre TAVR CTs. Nell Range discussed with pt and ordered a thyroid US.  Dyspnea:  etiology not clear check D dimer and LE duplex f/u DVT.  Check labs including Hct and BNP.  CXR today See above regarding f/u post TAVR echo   TTE LE  venous duplex CXR Labs  F/U with me in 3 months   Signed: [name] 10/14/2021, 1:14 PM

## 2021-10-07 ENCOUNTER — Telehealth: Payer: Self-pay | Admitting: Cardiovascular Disease

## 2021-10-07 NOTE — Telephone Encounter (Signed)
Called patient to inform him that he does not need another echo, per [name]. Canceled appointment for patient. Patient verbalized understanding.

## 2021-10-07 NOTE — Telephone Encounter (Signed)
patient calling to see if she stills need to have his echo on 10/5, even though he had one done in hospital couple weeks ago.Please advise

## 2021-10-10 ENCOUNTER — Other Ambulatory Visit (HOSPITAL_COMMUNITY): Payer: Medicare HMO

## 2021-10-14 ENCOUNTER — Ambulatory Visit
Admission: RE | Admit: 2021-10-14 | Discharge: 2021-10-14 | Disposition: A | Discharge status: Home / Self Care | Payer: Medicare HMO | Source: Ambulatory Visit | Attending: Cardiovascular Disease | Admitting: Cardiovascular Disease

## 2021-10-14 ENCOUNTER — Telehealth: Payer: Self-pay

## 2021-10-14 ENCOUNTER — Encounter: Payer: Self-pay | Admitting: Cardiovascular Disease

## 2021-10-14 ENCOUNTER — Other Ambulatory Visit (HOSPITAL_COMMUNITY)
Admission: RE | Admit: 2021-10-14 | Discharge: 2021-10-14 | Disposition: A | Discharge status: Home / Self Care | Payer: Medicare HMO | Source: Ambulatory Visit | Attending: Cardiovascular Disease | Admitting: Cardiovascular Disease

## 2021-10-14 ENCOUNTER — Ambulatory Visit: Payer: Medicare HMO | Admitting: Cardiovascular Disease

## 2021-10-14 ENCOUNTER — Other Ambulatory Visit: Payer: Self-pay | Admitting: Physician Assistant

## 2021-10-14 VITALS — BP 115/56 | HR 70 | Ht 69.0 in | Wt 234.0 lb

## 2021-10-14 DIAGNOSIS — E785 Hyperlipidemia, unspecified: Secondary | ICD-10-CM | POA: Insufficient documentation

## 2021-10-14 DIAGNOSIS — R0602 Shortness of breath: Secondary | ICD-10-CM | POA: Diagnosis not present

## 2021-10-14 DIAGNOSIS — I447 Left bundle-branch block, unspecified: Secondary | ICD-10-CM

## 2021-10-14 DIAGNOSIS — Z952 Presence of prosthetic heart valve: Secondary | ICD-10-CM | POA: Insufficient documentation

## 2021-10-14 DIAGNOSIS — I480 Paroxysmal atrial fibrillation: Secondary | ICD-10-CM

## 2021-10-14 DIAGNOSIS — E041 Nontoxic single thyroid nodule: Secondary | ICD-10-CM

## 2021-10-14 DIAGNOSIS — Z955 Presence of coronary angioplasty implant and graft: Secondary | ICD-10-CM | POA: Diagnosis not present

## 2021-10-14 DIAGNOSIS — I82409 Acute embolism and thrombosis of unspecified deep veins of unspecified lower extremity: Secondary | ICD-10-CM | POA: Insufficient documentation

## 2021-10-14 LAB — CBC
HCT: 38.2 % — ABNORMAL LOW (ref 39.0–52.0)
Hemoglobin: 12.1 g/dL — ABNORMAL LOW (ref 13.0–17.0)
MCH: 30.1 pg (ref 26.0–34.0)
MCHC: 31.7 g/dL (ref 30.0–36.0)
MCV: 95 fL (ref 80.0–100.0)
Platelets: 167 10*3/uL (ref 150–400)
RBC: 4.02 MIL/uL — ABNORMAL LOW (ref 4.22–5.81)
RDW: 16.3 % — ABNORMAL HIGH (ref 11.5–15.5)
WBC: 7.7 10*3/uL (ref 4.0–10.5)
nRBC: 0 % (ref 0.0–0.2)

## 2021-10-14 LAB — BASIC METABOLIC PANEL
Anion gap: 6 (ref 5–15)
BUN: 15 mg/dL (ref 8–23)
CO2: 26 mmol/L (ref 22–32)
Calcium: 9.8 mg/dL (ref 8.9–10.3)
Chloride: 110 mmol/L (ref 98–111)
Creatinine, Ser: 1.28 mg/dL — ABNORMAL HIGH (ref 0.61–1.24)
GFR, Estimated: 58 mL/min — ABNORMAL LOW (ref >60)
Glucose, Bld: 109 mg/dL — ABNORMAL HIGH (ref 70–99)
Potassium: 4.3 mmol/L (ref 3.5–5.1)
Sodium: 142 mmol/L (ref 135–145)

## 2021-10-14 LAB — D-DIMER, QUANTITATIVE: D-Dimer, Quant: 0.35 ug/mL-FEU (ref 0.00–0.50)

## 2021-10-14 LAB — BRAIN NATRIURETIC PEPTIDE: B Natriuretic Peptide: 180 pg/mL — ABNORMAL HIGH (ref 0.0–100.0)

## 2021-10-14 NOTE — Addendum Note (Signed)
Addended by: Levonne Hubert on: 10/14/2021 03:08 PM   Modules accepted: Orders

## 2021-10-14 NOTE — Patient Instructions (Signed)
Medication Instructions:  Your physician recommends that you continue on your current medications as directed. Please refer to the Current Medication list given to you today.  *If you need a refill on your cardiac medications before your next appointment, please call your pharmacy*   Lab Work: Your physician recommends that you return for lab work in: Today   If you have labs (blood work) drawn today and your tests are completely normal, you will receive your results only by: MyChart Message (if you have MyChart) OR A paper copy in the mail If you have any lab test that is abnormal or we need to change your treatment, we will call you to review the results.   Testing/Procedures: Your physician has requested that you have an echocardiogram. Echocardiography is a painless test that uses sound waves to create images of your heart. It provides your doctor with information about the size and shape of your heart and how well your heart's chambers and valves are working. This procedure takes approximately one hour. There are no restrictions for this procedure.  A chest x-ray takes a picture of the organs and structures inside the chest, including the heart, lungs, and blood vessels. This test can show several things, including, whether the heart is enlarges; whether fluid is building up in the lungs; and whether pacemaker / defibrillator leads are still in place.    Follow-Up: At [facility], you and your health needs are our priority.  As part of our continuing mission to provide you with exceptional heart care, we have created designated Provider Care Teams.  These Care Teams include your primary Cardiologist (physician) and Advanced Practice Providers (APPs -  Physician Assistants and Nurse Practitioners) who all work together to provide you with the care you need, when you need it.  We recommend signing up for the patient portal called "MyChart".  Sign up information is provided on  this After Visit Summary.  MyChart is used to connect with patients for Virtual Visits (Telemedicine).  Patients are able to view lab/test results, encounter notes, upcoming appointments, etc.  Non-urgent messages can be sent to your provider as well.   To learn more about what you can do with MyChart, go to [email or web address].    Your next appointment:   3 month(s)  The format for your next appointment:   In Person  Provider:   [name], MD    Other Instructions Thank you for choosing [facility]!    Important Information About Sugar

## 2021-10-14 NOTE — Telephone Encounter (Signed)
Patient notified and verbalized understanding. Patient had no questions or concerns at this time. PCP copied 

## 2021-10-14 NOTE — Telephone Encounter (Signed)
-----   Message from Josue Hector, MD sent at 10/14/2021  3:20 PM EDT ----- Labs ok including D dimer

## 2021-10-15 ENCOUNTER — Telehealth: Payer: Self-pay | Admitting: Cardiovascular Disease

## 2021-10-15 NOTE — Telephone Encounter (Signed)
Results discussed with patient,copied pcp 

## 2021-10-15 NOTE — Telephone Encounter (Signed)
Pt is returning call in regards to labs. Transferred to Bernita Raisin, RN

## 2021-10-16 ENCOUNTER — Ambulatory Visit: Payer: Medicare HMO

## 2021-10-17 NOTE — Addendum Note (Signed)
Encounter addended by: Markus Daft A on: 10/17/2021 12:28 PM  Actions taken: Imaging Exam ended

## 2021-10-21 ENCOUNTER — Ambulatory Visit (HOSPITAL_COMMUNITY)
Admission: RE | Admit: 2021-10-21 | Discharge: 2021-10-21 | Disposition: A | Discharge status: Home / Self Care | Payer: Medicare HMO | Source: Ambulatory Visit | Attending: Cardiovascular Disease | Admitting: Cardiovascular Disease

## 2021-10-21 ENCOUNTER — Ambulatory Visit (HOSPITAL_COMMUNITY)
Admission: RE | Admit: 2021-10-21 | Discharge: 2021-10-21 | Disposition: A | Discharge status: Home / Self Care | Payer: Medicare HMO | Source: Ambulatory Visit | Attending: Physician Assistant | Admitting: Physician Assistant

## 2021-10-21 ENCOUNTER — Telehealth: Payer: Self-pay | Admitting: Cardiovascular Disease

## 2021-10-21 DIAGNOSIS — M79604 Pain in right leg: Secondary | ICD-10-CM | POA: Diagnosis not present

## 2021-10-21 DIAGNOSIS — I82409 Acute embolism and thrombosis of unspecified deep veins of unspecified lower extremity: Secondary | ICD-10-CM | POA: Diagnosis not present

## 2021-10-21 DIAGNOSIS — I447 Left bundle-branch block, unspecified: Secondary | ICD-10-CM

## 2021-10-21 DIAGNOSIS — E041 Nontoxic single thyroid nodule: Secondary | ICD-10-CM | POA: Diagnosis not present

## 2021-10-21 DIAGNOSIS — R1900 Intra-abdominal and pelvic swelling, mass and lump, unspecified site: Secondary | ICD-10-CM

## 2021-10-21 DIAGNOSIS — Z952 Presence of prosthetic heart valve: Secondary | ICD-10-CM

## 2021-10-21 DIAGNOSIS — E042 Nontoxic multinodular goiter: Secondary | ICD-10-CM | POA: Diagnosis not present

## 2021-10-21 DIAGNOSIS — M7989 Other specified soft tissue disorders: Secondary | ICD-10-CM | POA: Diagnosis not present

## 2021-10-21 NOTE — Telephone Encounter (Signed)
William Hector, MD  10/21/2021  2:22 PM EDT     Can order CTA abdomen pelvis to further evaluate left inguinal area since he is post TAVR No DVT good      Please schedule CTA this week per Dr.Nishan. I spoke with patient and discussed results of lower extremity doppler, negative for DVT. Patient will await call from scheduling for CTA.

## 2021-10-21 NOTE — Telephone Encounter (Signed)
STAT results.

## 2021-10-21 NOTE — Telephone Encounter (Signed)
STAT call from Opal Sidles at [facility] [place] lower extremity done today:  IMPRESSION: 1. No lower extremity DVT. 2. Hypoechoic rounded structure noted in the left inguinal region, adjacent to the proximal superficial femoral artery, measuring 2.2 x 1.6 cm. There is questionable minimal flow within this structure. Differential diagnosis includes nearly completely thrombosed pseudoaneurysm, hematoma, or pathologically enlarged lymph node. Further evaluation with CTA should be considered.   These results will be called to the ordering clinician or representative by the Radiologist Assistant, and communication documented in the PACS or [employer].      I will send Dr.Nishan a secure chat and forward this phone message.

## 2021-10-22 ENCOUNTER — Ambulatory Visit (HOSPITAL_COMMUNITY)
Admission: RE | Admit: 2021-10-22 | Discharge: 2021-10-22 | Disposition: A | Discharge status: Home / Self Care | Payer: Medicare HMO | Source: Ambulatory Visit | Attending: Physician Assistant | Admitting: Physician Assistant

## 2021-10-22 DIAGNOSIS — Z952 Presence of prosthetic heart valve: Secondary | ICD-10-CM | POA: Diagnosis not present

## 2021-10-22 LAB — ECHOCARDIOGRAM COMPLETE
AR max vel: 2.2 cm2
AV Area VTI: 2.23 cm2
AV Area mean vel: 2.24 cm2
AV Mean grad: 15 mmHg
AV Peak grad: 29 mmHg
Ao pk vel: 2.69 m/s
Area-P 1/2: 2.68 cm2
MV M vel: 5.51 m/s
MV Peak grad: 121.4 mmHg
MV VTI: 2.77 cm2
Radius: 0.6 cm
S' Lateral: 3 cm

## 2021-10-22 NOTE — Progress Notes (Signed)
*  PRELIMINARY RESULTS* Echocardiogram 2D Echocardiogram has been performed.  William [name] 10/22/2021, 10:33 AM

## 2021-10-24 ENCOUNTER — Ambulatory Visit (HOSPITAL_COMMUNITY)
Admission: RE | Admit: 2021-10-24 | Discharge: 2021-10-24 | Disposition: A | Discharge status: Home / Self Care | Payer: Medicare HMO | Source: Ambulatory Visit | Attending: Cardiovascular Disease | Admitting: Cardiovascular Disease

## 2021-10-24 ENCOUNTER — Encounter (HOSPITAL_COMMUNITY): Payer: Self-pay

## 2021-10-24 ENCOUNTER — Encounter (HOSPITAL_COMMUNITY)
Admission: RE | Admit: 2021-10-24 | Discharge: 2021-10-24 | Disposition: A | Discharge status: Home / Self Care | Payer: Medicare HMO | Source: Ambulatory Visit | Attending: Cardiovascular Disease | Admitting: Cardiovascular Disease

## 2021-10-24 VITALS — BP 116/54 | HR 66 | Ht 69.0 in | Wt 233.7 lb

## 2021-10-24 DIAGNOSIS — R1900 Intra-abdominal and pelvic swelling, mass and lump, unspecified site: Secondary | ICD-10-CM | POA: Insufficient documentation

## 2021-10-24 DIAGNOSIS — Z952 Presence of prosthetic heart valve: Secondary | ICD-10-CM | POA: Insufficient documentation

## 2021-10-24 DIAGNOSIS — I724 Aneurysm of artery of lower extremity: Secondary | ICD-10-CM | POA: Diagnosis not present

## 2021-10-24 DIAGNOSIS — K439 Ventral hernia without obstruction or gangrene: Secondary | ICD-10-CM | POA: Diagnosis not present

## 2021-10-24 DIAGNOSIS — Q438 Other specified congenital malformations of intestine: Secondary | ICD-10-CM | POA: Diagnosis not present

## 2021-10-24 DIAGNOSIS — Q272 Other congenital malformations of renal artery: Secondary | ICD-10-CM | POA: Diagnosis not present

## 2021-10-24 MED ORDER — IOHEXOL 350 MG/ML SOLN
100.0000 mL | Freq: Once | INTRAVENOUS | Status: AC | PRN
  Administered 2021-10-24: 100 mL via INTRAVENOUS

## 2021-10-24 NOTE — Progress Notes (Signed)
Cardiac Individual Treatment Plan  Patient Details  Name: William [name] MRN: [id number] Date of Birth: 1945-11-09 Referring Provider:   Flowsheet Row CARDIAC REHAB PHASE II ORIENTATION from 10/24/2021 in [place]  Referring Provider Dr. Angelena Form       Initial Encounter Date:  Flowsheet Row CARDIAC REHAB PHASE II ORIENTATION from 10/24/2021 in [place]  Date 10/24/21       Visit Diagnosis: S/P TAVR (transcatheter aortic valve replacement)  Patient's Home Medications on Admission:  Current Outpatient Medications:    acarbose (PRECOSE) 100 MG tablet, Take 100 mg by mouth 3 (three) times daily with meals. , Disp: , Rfl:    Acetylcysteine (NAC 600) 600 MG CAPS, Take 1,200 mg by mouth in the morning., Disp: , Rfl:    alendronate (FOSAMAX) 70 MG tablet, Take 1 tablet (70 mg total) by mouth every 7 (seven) days. Take with a full glass of water on an empty stomach. (Patient taking differently: Take 70 mg by mouth every 7 (seven) days. Take with a full glass of water on an empty stomach. Taken [employer]), Disp: 4 tablet, Rfl: 11   alfuzosin (UROXATRAL) 10 MG 24 hr tablet, Take 1 tablet (10 mg total) by mouth daily with breakfast., Disp: 30 tablet, Rfl: 11   amLODipine (NORVASC) 5 MG tablet, TAKE 1 TABLET(5 MG) BY MOUTH DAILY (Patient taking differently: Take 5 mg by mouth every evening.), Disp: 90 tablet, Rfl: 0   apixaban (ELIQUIS) 5 MG TABS tablet, Take 1 tablet (5 mg total) by mouth 2 (two) times daily., Disp: 60 tablet, Rfl: 2   Ascorbic Acid (VITAMIN C) 1000 MG tablet, Take 1,000 mg by mouth every morning., Disp: , Rfl:    aspirin EC 81 MG tablet, Take 81 mg by mouth in the morning. Swallow whole., Disp: , Rfl:    Cholecalciferol (VITAMIN D3) 125 MCG (5000 UT) TABS, Take 5,000 Units by mouth in the morning., Disp: , Rfl:    clopidogrel (PLAVIX) 75 MG tablet, Take 1 tablet (75 mg total) by mouth daily., Disp: 90 tablet, Rfl: 3    Coenzyme Q10 (COQ10) 100 MG CAPS, Take 100 mg by mouth in the morning., Disp: , Rfl:    FARXIGA 10 MG TABS tablet, Take 10 mg by mouth in the morning., Disp: , Rfl:    ferrous sulfate 325 (65 FE) MG EC tablet, Take 1 tablet (325 mg total) by mouth daily with breakfast., Disp: 30 tablet, Rfl: 3   furosemide (LASIX) 20 MG tablet, Take 20 mg by mouth 2 (two) times daily. , Disp: , Rfl: 0   glipiZIDE (GLUCOTROL) 10 MG tablet, Take 10 mg by mouth 2 (two) times daily., Disp: , Rfl:    Glucosamine-Chondroitin (MOVE FREE PO), Take 1 tablet by mouth in the morning and at bedtime., Disp: , Rfl:    HYDROcodone-acetaminophen (NORCO) 10-325 MG tablet, Take 1 tablet by mouth every 6 (six) hours as needed for moderate pain., Disp: , Rfl:    LEVEMIR FLEXTOUCH 100 UNIT/ML Pen, Inject 90 Units into the skin at bedtime. , Disp: , Rfl:    lisinopril (ZESTRIL) 20 MG tablet, Take 1 tablet (20 mg total) by mouth daily. (Patient taking differently: Take 20 mg by mouth every evening.), Disp: 90 tablet, Rfl: 1   Magnesium 400 MG TABS, Take 400 mg by mouth in the morning., Disp: , Rfl:    Menatetrenone (VITAMIN K2) 100 MCG TABS, Take 100 mcg by mouth in the morning., Disp: , Rfl:  metFORMIN (GLUCOPHAGE) 1000 MG tablet, Take 1,000 mg by mouth 2 (two) times daily., Disp: , Rfl: 3   metFORMIN (GLUCOPHAGE-XR) 500 MG 24 hr tablet, Take 500 mg by mouth every evening. Take with 1000 Metformin, Disp: , Rfl:    metoprolol tartrate (LOPRESSOR) 25 MG tablet, Take 1 tablet (25 mg total) by mouth 2 (two) times daily., Disp: 60 tablet, Rfl: 2   nitroGLYCERIN (NITROSTAT) 0.4 MG SL tablet, Place 1 tablet (0.4 mg total) under the tongue every 5 (five) minutes x 3 doses as needed for chest pain (if no relief after 2nd dose, proceed to the ED for an evalution or call 911)., Disp: 75 tablet, Rfl: 2   pantoprazole (PROTONIX) 40 MG tablet, Take 40 mg by mouth every evening. , Disp: , Rfl:    potassium citrate (UROCIT-K) 5 MEQ (540 MG) SR tablet,  Take 5 mEq by mouth 3 (three) times daily with meals., Disp: , Rfl:    QUERCETIN PO, Take 1,000 mg by mouth in the morning., Disp: , Rfl:    rosuvastatin (CRESTOR) 20 MG tablet, Take 20 mg by mouth every evening. , Disp: , Rfl:    tamsulosin (FLOMAX) 0.4 MG CAPS capsule, Take 0.4 mg by mouth in the morning and at bedtime., Disp: , Rfl:    trolamine salicylate (BLUE-EMU HEMP) 10 % cream, Apply 1 application topically as needed for muscle pain., Disp: , Rfl:    vitamin B-12 (CYANOCOBALAMIN) 1000 MCG tablet, Take 1,000 mcg by mouth in the morning., Disp: , Rfl:    zinc gluconate 50 MG tablet, Take 50 mg by mouth in the morning., Disp: , Rfl:    ACCU-CHEK GUIDE test strip, , Disp: , Rfl:    Accu-Chek Softclix Lancets lancets, 1 each 3 (three) times daily., Disp: , Rfl:   Past Medical History: Past Medical History:  Diagnosis Date   Anxiety    Aortic stenosis    Arthritis    CAD (coronary artery disease)    a. s/p DES to distal RCA in 08/2013, DES to LAD 07/2021   Cancer [facility])    prostate   CKD (chronic kidney disease)    Diabetes mellitus without complication (HCC)    Family history of colon cancer    GERD (gastroesophageal reflux disease)    Heart murmur    Hypercalcemia    Hypercholesteremia    Hypertension    Kidney stone    PAF (paroxysmal atrial fibrillation) (HCC)    Personal history of colonic polyps    Pneumonia    S/P TAVR (transcatheter aortic valve replacement) 09/17/2021   s/p TAVR with a 23 mm Edwards S3UR via the TF approach by Dr. Angelena Form & Bartle    Tobacco Use: Social History   Tobacco Use  Smoking Status Former   Packs/day: 3.00   Years: 45.00   Total pack years: 135.00   Types: Cigarettes   Start date: 01/06/1962   Quit date: 10/24/2004   Years since quitting: 17.0  Smokeless Tobacco Former   Quit date: 08/10/2005    Labs: Review Flowsheet  More data exists      Latest Ref Rng & Units 08/06/2009 10/21/2014 07/15/2021 09/13/2021 09/17/2021  Labs for ITP  Cardiac and Pulmonary Rehab  Hemoglobin A1c 4.8 - 5.6 % - - - 7.0  -  PH, Arterial 7.35 - 7.45 - - 7.367  7.372  - -  PCO2 arterial 32 - 48 mmHg - - 36.8  35.9  - -  Bicarbonate 20.0 - 28.0  mmol/L - - 24.2  21.1  20.9  - -  TCO2 22 - 32 mmol/L '26  25  26  22  22  '$ - 23   Acid-base deficit 0.0 - 2.0 mmol/L - - 2.0  4.0  4.0  - -  O2 Saturation % - - 60  97  98  - -    Capillary Blood Glucose: Lab Results  Component Value Date   GLUCAP 235 (H) 09/23/2021   GLUCAP 187 (H) 09/23/2021   GLUCAP 180 (H) 09/22/2021   GLUCAP 174 (H) 09/22/2021   GLUCAP 170 (H) 09/22/2021    POCT Glucose     Row Name 10/24/21 0912             POCT Blood Glucose   Pre-Exercise 116 mg/dL                Exercise Target Goals: Exercise Program Goal: Individual exercise prescription set using results from initial 6 min walk test and THRR while considering  patient's activity barriers and safety.   Exercise Prescription Goal: Starting with aerobic activity 30 plus minutes a day, 3 days per week for initial exercise prescription. Provide home exercise prescription and guidelines that participant acknowledges understanding prior to discharge.  Activity Barriers & Risk Stratification:  Activity Barriers & Cardiac Risk Stratification - 10/24/21 0839       Activity Barriers & Cardiac Risk Stratification   Activity Barriers Arthritis;Back Problems;Deconditioning;Shortness of Breath;Chest Pain/Angina;Balance Concerns    Cardiac Risk Stratification High             6 Minute Walk:  6 Minute Walk     Row Name 10/24/21 1003         6 Minute Walk   Phase Initial     Distance 1100 feet     Walk Time 6 minutes     # of Rest Breaks 0     MPH 2.03     METS 1.75     RPE 13     VO2 Peak 6.15     Symptoms No     Resting HR 66 bpm     Resting BP 116/54     Resting Oxygen Saturation  97 %     Exercise Oxygen Saturation  during 6 min walk 98 %     Max Ex. HR 84 bpm     Max Ex. BP 136/54     2  Minute Post BP 120/54              Oxygen Initial Assessment:   Oxygen Re-Evaluation:   Oxygen Discharge (Final Oxygen Re-Evaluation):   Initial Exercise Prescription:  Initial Exercise Prescription - 10/24/21 1000       Date of Initial Exercise RX and Referring Provider   Date 10/24/21    Referring Provider Dr. Angelena Form    Expected Discharge Date 01/17/22      NuStep   Level 1    SPM 60    Minutes 17      Arm Ergometer   Level 1    RPM 45    Minutes 22      Prescription Details   Frequency (times per week) 3    Duration Progress to 30 minutes of continuous aerobic without signs/symptoms of physical distress      Intensity   THRR 40-80% of Max Heartrate 58-116    Ratings of Perceived Exertion 11-13    Perceived Dyspnea 0-4      Resistance Training  Training Prescription Yes    Weight 3    Reps 10-15             Perform Capillary Blood Glucose checks as needed.  Exercise Prescription Changes:   Exercise Comments:   Exercise Goals and Review:   Exercise Goals     Row Name 10/24/21 1008             Exercise Goals   Increase Physical Activity Yes       Intervention Provide advice, education, support and counseling about physical activity/exercise needs.;Develop an individualized exercise prescription for aerobic and resistive training based on initial evaluation findings, risk stratification, comorbidities and participant's personal goals.       Expected Outcomes Short Term: Attend rehab on a regular basis to increase amount of physical activity.;Long Term: Add in home exercise to make exercise part of routine and to increase amount of physical activity.;Long Term: Exercising regularly at least 3-5 days a week.       Increase Strength and Stamina Yes       Intervention Provide advice, education, support and counseling about physical activity/exercise needs.;Develop an individualized exercise prescription for aerobic and resistive training  based on initial evaluation findings, risk stratification, comorbidities and participant's personal goals.       Expected Outcomes Short Term: Increase workloads from initial exercise prescription for resistance, speed, and METs.;Short Term: Perform resistance training exercises routinely during rehab and add in resistance training at home;Long Term: Improve cardiorespiratory fitness, muscular endurance and strength as measured by increased METs and functional capacity (6MWT)       Able to understand and use rate of perceived exertion (RPE) scale Yes       Intervention Provide education and explanation on how to use RPE scale       Expected Outcomes Short Term: Able to use RPE daily in rehab to express subjective intensity level;Long Term:  Able to use RPE to guide intensity level when exercising independently       Knowledge and understanding of Target Heart Rate Range (THRR) Yes       Intervention Provide education and explanation of THRR including how the numbers were predicted and where they are located for reference       Expected Outcomes Short Term: Able to state/look up THRR;Short Term: Able to use daily as guideline for intensity in rehab;Long Term: Able to use THRR to govern intensity when exercising independently       Able to check pulse independently Yes       Intervention Provide education and demonstration on how to check pulse in carotid and radial arteries.;Review the importance of being able to check your own pulse for safety during independent exercise       Expected Outcomes Short Term: Able to explain why pulse checking is important during independent exercise;Long Term: Able to check pulse independently and accurately       Understanding of Exercise Prescription Yes       Intervention Provide education, explanation, and written materials on patient's individual exercise prescription       Expected Outcomes Short Term: Able to explain program exercise prescription;Long Term: Able to  explain home exercise prescription to exercise independently                Exercise Goals Re-Evaluation :    Discharge Exercise Prescription (Final Exercise Prescription Changes):   Nutrition:  Target Goals: Understanding of nutrition guidelines, daily intake of sodium '1500mg'$ , cholesterol '200mg'$ , calories 30% from fat  and 7% or less from saturated fats, daily to have 5 or more servings of fruits and vegetables.  Biometrics:  Pre Biometrics - 10/24/21 1009       Pre Biometrics   Height '5\' 9"'$  (1.753 m)    Weight 233 lb 11 oz (106 kg)    Waist Circumference 50 inches    Hip Circumference 41 inches    Waist to Hip Ratio 1.22 %    BMI (Calculated) 34.49    Triceps Skinfold 10 mm    % Body Fat 33.3 %    Grip Strength 28.4 kg    Flexibility 0 in    Single Leg Stand 2 seconds              Nutrition Therapy Plan and Nutrition Goals:  Nutrition Therapy & Goals - 10/24/21 0853       Intervention Plan   Intervention Nutrition handout(s) given to patient.    Expected Outcomes Short Term Goal: Understand basic principles of dietary content, such as calories, fat, sodium, cholesterol and nutrients.             Nutrition Assessments:  Nutrition Assessments - 10/24/21 0855       MEDFICTS Scores   Pre Score 45            MEDIFICTS Score Key: ?70 Need to make dietary changes  40-70 Heart Healthy Diet ? 40 Therapeutic Level Cholesterol Diet   Picture Your Plate Scores: <75 Unhealthy dietary pattern with much room for improvement. 41-50 Dietary pattern unlikely to meet recommendations for good health and room for improvement. 51-60 More healthful dietary pattern, with some room for improvement.  >60 Healthy dietary pattern, although there may be some specific behaviors that could be improved.    Nutrition Goals Re-Evaluation:   Nutrition Goals Discharge (Final Nutrition Goals Re-Evaluation):   Psychosocial: Target Goals: Acknowledge presence or  absence of significant depression and/or stress, maximize coping skills, provide positive support system. Participant is able to verbalize types and ability to use techniques and skills needed for reducing stress and depression.  Initial Review & Psychosocial Screening:  Initial Psych Review & Screening - 10/24/21 0846       Initial Review   Current issues with None Identified      Family Dynamics   Good Support System? Yes    Comments His sons are his support system.      Barriers   Psychosocial barriers to participate in program There are no identifiable barriers or psychosocial needs.      Screening Interventions   Interventions Encouraged to exercise    Expected Outcomes Long Term goal: The participant improves quality of Life and PHQ9 Scores as seen by post scores and/or verbalization of changes;Short Term goal: Identification and review with participant of any Quality of Life or Depression concerns found by scoring the questionnaire.             Quality of Life Scores:  Quality of Life - 10/24/21 1010       Quality of Life   Select Quality of Life      Quality of Life Scores   Health/Function Pre 20.04 %    Socioeconomic Pre 23.93 %    Psych/Spiritual Pre 18.93 %    Family Pre 22.5 %    GLOBAL Pre 21 %            Scores of 19 and below usually indicate a poorer quality of life in these areas.  A difference  of  2-3 points is a clinically meaningful difference.  A difference of 2-3 points in the total score of the Quality of Life Index has been associated with significant improvement in overall quality of life, self-image, physical symptoms, and general health in studies assessing change in quality of life.  PHQ-9: Review Flowsheet       10/24/2021 09/27/2013  Depression screen PHQ 2/9  Decreased Interest 1 0  Down, Depressed, Hopeless 0 0  PHQ - 2 Score 1 0  Altered sleeping 0 -  Tired, decreased energy 2 -  Change in appetite 0 -  Feeling bad or failure  about yourself  0 -  Trouble concentrating 0 -  Moving slowly or fidgety/restless 0 -  Suicidal thoughts 0 -  PHQ-9 Score 3 -  Difficult doing work/chores Somewhat difficult -   Interpretation of Total Score  Total Score Depression Severity:  1-4 = Minimal depression, 5-9 = Mild depression, 10-14 = Moderate depression, 15-19 = Moderately severe depression, 20-27 = Severe depression   Psychosocial Evaluation and Intervention:  Psychosocial Evaluation - 10/24/21 1004       Psychosocial Evaluation & Interventions   Interventions Encouraged to exercise with the program and follow exercise prescription    Comments Pt has no barriers to participating in CR. He has no identifiable psychosocial issues. He scored a 3 on his PHQ-9, and he relates this to his lack of energy since before his TAVR and his lack of pleasure in doing activities due to his fatigue. He reports that he has a good support system with his sons, and he also mentioned his grandsons and great grandsons. He reports that his goals while in the program are to lose weight and to improve his energy levels. He previously participated in CR about 8 years ago, and he is eager to begin the program again.    Expected Outcomes Pt will continue to have no identifiable psychosocial issues.    Continue Psychosocial Services  No Follow up required             Psychosocial Re-Evaluation:   Psychosocial Discharge (Final Psychosocial Re-Evaluation):   Vocational Rehabilitation: Provide vocational rehab assistance to qualifying candidates.   Vocational Rehab Evaluation & Intervention:  Vocational Rehab - 10/24/21 0909       Initial Vocational Rehab Evaluation & Intervention   Assessment shows need for Vocational Rehabilitation No      Vocational Rehab Re-Evaulation   Comments retired             Education: Education Goals: Education classes will be provided on a weekly basis, covering required topics. Participant will  state understanding/return demonstration of topics presented.  Learning Barriers/Preferences:  Learning Barriers/Preferences - 10/24/21 0901       Learning Barriers/Preferences   Learning Barriers None    Learning Preferences Skilled Demonstration;Individual Instruction             Education Topics: Hypertension, Hypertension Reduction -Define heart disease and high blood pressure. Discus how high blood pressure affects the body and ways to reduce high blood pressure.   Exercise and Your Heart -Discuss why it is important to exercise, the FITT principles of exercise, normal and abnormal responses to exercise, and how to exercise safely.   Angina -Discuss definition of angina, causes of angina, treatment of angina, and how to decrease risk of having angina.   Cardiac Medications -Review what the following cardiac medications are used for, how they affect the body, and side effects that may occur  when taking the medications.  Medications include Aspirin, Beta blockers, calcium channel blockers, ACE Inhibitors, angiotensin receptor blockers, diuretics, digoxin, and antihyperlipidemics.   Congestive Heart Failure -Discuss the definition of CHF, how to live with CHF, the signs and symptoms of CHF, and how keep track of weight and sodium intake.   Heart Disease and Intimacy -Discus the effect sexual activity has on the heart, how changes occur during intimacy as we age, and safety during sexual activity.   Smoking Cessation / COPD -Discuss different methods to quit smoking, the health benefits of quitting smoking, and the definition of COPD.   Nutrition I: Fats -Discuss the types of cholesterol, what cholesterol does to the heart, and how cholesterol levels can be controlled.   Nutrition II: Labels -Discuss the different components of food labels and how to read food label   Heart Parts/Heart Disease and PAD -Discuss the anatomy of the heart, the pathway of blood  circulation through the heart, and these are affected by heart disease.   Stress I: Signs and Symptoms -Discuss the causes of stress, how stress may lead to anxiety and depression, and ways to limit stress.   Stress II: Relaxation -Discuss different types of relaxation techniques to limit stress.   Warning Signs of Stroke / TIA -Discuss definition of a stroke, what the signs and symptoms are of a stroke, and how to identify when someone is having stroke.   Knowledge Questionnaire Score:  Knowledge Questionnaire Score - 10/24/21 0902       Knowledge Questionnaire Score   Pre Score 22/24             Core Components/Risk Factors/Patient Goals at Admission:  Personal Goals and Risk Factors at Admission - 10/24/21 0909       Core Components/Risk Factors/Patient Goals on Admission    Weight Management Yes;Obesity;Weight Loss    Intervention Weight Management: Develop a combined nutrition and exercise program designed to reach desired caloric intake, while maintaining appropriate intake of nutrient and fiber, sodium and fats, and appropriate energy expenditure required for the weight goal.;Weight Management: Provide education and appropriate resources to help participant work on and attain dietary goals.;Weight Management/Obesity: Establish reasonable short term and long term weight goals.;Obesity: Provide education and appropriate resources to help participant work on and attain dietary goals.    Admit Weight 234 lb (106.1 kg)    Goal Weight: Short Term 220 lb (99.8 kg)    Expected Outcomes Short Term: Continue to assess and modify interventions until short term weight is achieved;Long Term: Adherence to nutrition and physical activity/exercise program aimed toward attainment of established weight goal;Weight Maintenance: Understanding of the daily nutrition guidelines, which includes 25-35% calories from fat, 7% or less cal from saturated fats, less than '200mg'$  cholesterol, less than  1.5gm of sodium, & 5 or more servings of fruits and vegetables daily;Weight Loss: Understanding of general recommendations for a balanced deficit meal plan, which promotes 1-2 lb weight loss per week and includes a negative energy balance of [phone] kcal/d;Understanding recommendations for meals to include 15-35% energy as protein, 25-35% energy from fat, 35-60% energy from carbohydrates, less than '200mg'$  of dietary cholesterol, 20-35 gm of total fiber daily;Understanding of distribution of calorie intake throughout the day with the consumption of 4-5 meals/snacks    Improve shortness of breath with ADL's Yes    Intervention Provide education, individualized exercise plan and daily activity instruction to help decrease symptoms of SOB with activities of daily living.    Expected Outcomes Short  Term: Improve cardiorespiratory fitness to achieve a reduction of symptoms when performing ADLs;Long Term: Be able to perform more ADLs without symptoms or delay the onset of symptoms    Diabetes Yes    Intervention Provide education about signs/symptoms and action to take for hypo/hyperglycemia.;Provide education about proper nutrition, including hydration, and aerobic/resistive exercise prescription along with prescribed medications to achieve blood glucose in normal ranges: Fasting glucose 65-99 mg/dL    Expected Outcomes Short Term: Participant verbalizes understanding of the signs/symptoms and immediate care of hyper/hypoglycemia, proper foot care and importance of medication, aerobic/resistive exercise and nutrition plan for blood glucose control.;Long Term: Attainment of HbA1C < 7%.    Hypertension Yes    Intervention Provide education on lifestyle modifcations including regular physical activity/exercise, weight management, moderate sodium restriction and increased consumption of fresh fruit, vegetables, and low fat dairy, alcohol moderation, and smoking cessation.;Monitor prescription use compliance.     Expected Outcomes Short Term: Continued assessment and intervention until BP is < 140/33m HG in hypertensive participants. < 130/874mHG in hypertensive participants with diabetes, heart failure or chronic kidney disease.;Long Term: Maintenance of blood pressure at goal levels.    Lipids Yes    Intervention Provide education and support for participant on nutrition & aerobic/resistive exercise along with prescribed medications to achieve LDL '70mg'$ , HDL >'40mg'$ .    Expected Outcomes Long Term: Cholesterol controlled with medications as prescribed, with individualized exercise RX and with personalized nutrition plan. Value goals: LDL < '70mg'$ , HDL > 40 mg.;Short Term: Participant states understanding of desired cholesterol values and is compliant with medications prescribed. Participant is following exercise prescription and nutrition guidelines.             Core Components/Risk Factors/Patient Goals Review:    Core Components/Risk Factors/Patient Goals at Discharge (Final Review):    ITP Comments:   Comments: Patient arrived for 1st visit/orientation/education at 0800. Patient was referred to CR by Dr. McAngelena Formue to S/P TAVR (Z95.2). During orientation advised patient on arrival and appointment times what to wear, what to do before, during and after exercise. Reviewed attendance and class policy.  Pt is scheduled to return Cardiac Rehab on 10/28/2021 at 0815. Pt was advised to come to class 15 minutes before class starts.  Discussed RPE/Dpysnea scales. Patient participated in warm up stretches. Patient was able to complete 6 minute walk test.  Telemetry: sinus rhythm with LBBB. Patient was measured for the equipment. Discussed equipment safety with patient. Took patient pre-anthropometric measurements. Patient finished visit at 0930.

## 2021-10-28 ENCOUNTER — Encounter (HOSPITAL_COMMUNITY)
Admission: RE | Admit: 2021-10-28 | Discharge: 2021-10-28 | Disposition: A | Discharge status: Home / Self Care | Payer: Medicare HMO | Source: Ambulatory Visit | Attending: Cardiovascular Disease | Admitting: Cardiovascular Disease

## 2021-10-28 VITALS — Wt 232.4 lb

## 2021-10-28 DIAGNOSIS — Z952 Presence of prosthetic heart valve: Secondary | ICD-10-CM

## 2021-10-28 DIAGNOSIS — R1900 Intra-abdominal and pelvic swelling, mass and lump, unspecified site: Secondary | ICD-10-CM | POA: Diagnosis not present

## 2021-10-28 NOTE — Progress Notes (Signed)
Daily Session Note  Patient Details  Name: William [name] MRN: [id number] Date of Birth: 05/02/1945 Referring Provider:   Flowsheet Row CARDIAC REHAB PHASE II ORIENTATION from 10/24/2021 in [place]  Referring Provider Dr. Angelena Form       Encounter Date: 10/28/2021  Check In:  Session Check In - 10/28/21 0930       Check-In   Supervising physician immediately available to respond to emergencies [facility] MD immediately available    Physician(s) Dr. Harl Bowie    Location [facility]    Staff Present Hoy Register, MS, ACSM-CEP, Exercise Physiologist;Daphyne Hassell Done, RN, BSN;Heather Otho Ket, BS, Exercise Physiologist    Virtual Visit No    Medication changes reported     No    Fall or balance concerns reported    Yes    Comments He has not fallen but he does lose his balance often. His arthritis in his knees also causes his knees to feel weak at times.    Tobacco Cessation No Change    Warm-up and Cool-down Performed as group-led instruction    Resistance Training Performed Yes    VAD Patient? No    PAD/SET Patient? No      Pain Assessment   Currently in Pain? Yes    Pain Score 7     Pain Location Back    Pain Orientation Lower    Pain Descriptors / Indicators Throbbing    Pain Type Chronic pain    Pain Onset More than a month ago    Pain Frequency Intermittent    Multiple Pain Sites No             Capillary Blood Glucose: No results found for this or any previous visit (from the past 24 hour(s)).    Social History   Tobacco Use  Smoking Status Former   Packs/day: 3.00   Years: 45.00   Total pack years: 135.00   Types: Cigarettes   Start date: 01/06/1962   Quit date: 10/24/2004   Years since quitting: 17.0  Smokeless Tobacco Former   Quit date: 08/10/2005    Goals Met:  Independence with exercise equipment Exercise tolerated well No report of concerns or symptoms today Strength training completed today  Goals  Unmet:  Not Applicable  Comments: checkout time Is 1030   Dr. Carlyle Dolly is Medical Director for [facility]

## 2021-10-29 ENCOUNTER — Ambulatory Visit: Payer: Medicare HMO | Admitting: Urology

## 2021-10-29 ENCOUNTER — Ambulatory Visit (HOSPITAL_COMMUNITY)
Admission: RE | Admit: 2021-10-29 | Discharge: 2021-10-29 | Disposition: A | Discharge status: Home / Self Care | Payer: Medicare HMO | Source: Ambulatory Visit | Attending: Urology | Admitting: Urology

## 2021-10-29 ENCOUNTER — Encounter: Payer: Self-pay | Admitting: Urology

## 2021-10-29 VITALS — BP 127/54 | HR 68

## 2021-10-29 DIAGNOSIS — R3912 Poor urinary stream: Secondary | ICD-10-CM | POA: Diagnosis not present

## 2021-10-29 DIAGNOSIS — Z87442 Personal history of urinary calculi: Secondary | ICD-10-CM

## 2021-10-29 DIAGNOSIS — N200 Calculus of kidney: Secondary | ICD-10-CM | POA: Insufficient documentation

## 2021-10-29 DIAGNOSIS — M160 Bilateral primary osteoarthritis of hip: Secondary | ICD-10-CM | POA: Diagnosis not present

## 2021-10-29 DIAGNOSIS — N401 Enlarged prostate with lower urinary tract symptoms: Secondary | ICD-10-CM

## 2021-10-29 DIAGNOSIS — N400 Enlarged prostate without lower urinary tract symptoms: Secondary | ICD-10-CM

## 2021-10-29 LAB — URINALYSIS, ROUTINE W REFLEX MICROSCOPIC
Bilirubin, UA: NEGATIVE
Ketones, UA: NEGATIVE
Leukocytes,UA: NEGATIVE
Nitrite, UA: NEGATIVE
Protein,UA: NEGATIVE
RBC, UA: NEGATIVE
Specific Gravity, UA: 1.01 (ref 1.005–1.030)
Urobilinogen, Ur: 0.2 mg/dL (ref 0.2–1.0)
pH, UA: 6 (ref 5.0–7.5)

## 2021-10-29 LAB — MICROSCOPIC EXAMINATION
Bacteria, UA: NONE SEEN
Epithelial Cells (non renal): NONE SEEN /hpf (ref 0–10)
WBC, UA: NONE SEEN /hpf (ref 0–5)

## 2021-10-29 MED ORDER — SILODOSIN 8 MG PO CAPS: 8.0000 mg | ORAL_CAPSULE | Freq: Every day | ORAL | 3 refills | Status: DC

## 2021-10-29 NOTE — Progress Notes (Signed)
10/29/2021 9:01 AM   William [name] [birth year] [id number]  Referring provider: [name], MD [address] [place],   [address]  Followup nephrolithiasis and weak urinary stream.   HPI: [name] is a 76yo here for followup for BPH and nephrolithiasis. No stone event since last visit. CT 10/19 shows a 59m left lower pole calculus which is not seen well on KUB today. IPSS 19 QOL 4 on uroxatral. He did better on silodosin which insurance would not cover. Nocturia 4-5x. Urine stream is week.    PMH: Past Medical History:  Diagnosis Date   Anxiety    Aortic stenosis    Arthritis    CAD (coronary artery disease)    a. 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 MAngelena Form& Bartle    Surgical History: Past Surgical History:  Procedure Laterality Date   APPENDECTOMY     BIOPSY  11/03/2019   Benign gastric mucosa with reactive changes and focal inflammation   BIOPSY  03/11/2021   Procedure: BIOPSY;  Surgeon: CEloise Harman DO;  [facility];  Service: Endoscopy;;   COLONOSCOPY WITH PROPOFOL N/A 02/15/2018   12 polyps ranging in 5 to 20 mm in size were tubular adenoma and recommended repeat exam in 2023   COLONOSCOPY WITH PROPOFOL N/A 03/11/2021   Procedure: COLONOSCOPY WITH PROPOFOL;  Surgeon: CEloise Harman DO;  [facility];  Service: Endoscopy;  Laterality: N/A;  9:15am   CORONARY STENT INTERVENTION N/A 07/15/2021   Procedure:  CORONARY STENT INTERVENTION;  Surgeon: MBurnell Blanks MD;  [facility];  Service: Cardiovascular;  Laterality: N/A;   CORONARY STENT PLACEMENT  08/10/2013   ESOPHAGOGASTRODUODENOSCOPY (EGD) WITH PROPOFOL N/A 11/03/2019   normal esophagus, small hiatal hernia, diffuse erythematous mucosa in the entire stomach with scattered erosions.  Status post gastric biopsies for histology.  Surgical pathology found the biopsies to be benign gastric mucosa with reactive changes and focal inflammation, negative for H. Pylori.   FLEXIBLE SIGMOIDOSCOPY N/A 11/03/2019   attempted colonoscopy but inadequate prep   gsw to abd     INTRAOPERATIVE TRANSTHORACIC ECHOCARDIOGRAM N/A 09/17/2021   Procedure: INTRAOPERATIVE TRANSTHORACIC ECHOCARDIOGRAM;  Surgeon: MBurnell Blanks MD;  [facility];  Service: Open Heart Surgery;  Laterality: N/A;   INTRAVASCULAR LITHOTRIPSY  07/15/2021   Procedure: INTRAVASCULAR LITHOTRIPSY;  Surgeon: MBurnell Blanks MD;  [facility];  Service: Cardiovascular;;   LEFT HEART CATHETERIZATION WITH CORONARY ANGIOGRAM N/A 08/10/2013   Procedure: LEFT HEART CATHETERIZATION WITH CORONARY ANGIOGRAM;  Surgeon: MWellington Hampshire MD;  [facility];  Service: Cardiovascular;  Laterality: N/A;   MULTIPLE EXTRACTIONS WITH ALVEOLOPLASTY N/A 08/22/2021   Procedure: MULTIPLE EXTRACTION WITH ALVEOLOPLASTY;  Surgeon: OCharlaine Dalton [name];  [facility]  Service: Dentistry;  Laterality: N/A;   POLYPECTOMY  02/15/2018   Procedure: POLYPECTOMY;  Surgeon: [name], MD;  [facility];  Service: Endoscopy;;  colon   POLYPECTOMY  03/11/2021   Procedure: POLYPECTOMY;  Surgeon: [name], DO;  [facility];  Service: Endoscopy;;   RIGHT/LEFT HEART CATH AND CORONARY ANGIOGRAPHY N/A 07/15/2021   Procedure: RIGHT/LEFT HEART CATH AND CORONARY ANGIOGRAPHY;  Surgeon: [name], MD;   Location: [place] [facility];  Service: Cardiovascular;  Laterality: N/A;   TRANSCATHETER AORTIC VALVE REPLACEMENT, TRANSFEMORAL N/A 09/17/2021   Procedure: Transcatheter Aortic Valve Replacement, Transfemoral;  Surgeon: [name], MD;  Location: [place] [facility];  Service: Open Heart Surgery;  Laterality: N/A;    Home Medications:  Allergies as of 10/29/2021   No Known Allergies      Medication List        Accurate as of October 29, 2021  9:01 AM. If you have any questions, ask your nurse or doctor.          acarbose 100 MG tablet Commonly known as: PRECOSE Take 100 mg by mouth 3 (three) times daily with meals.   Accu-Chek Guide test strip Generic drug: glucose blood   Accu-Chek Softclix Lancets lancets 1 each 3 (three) times daily.   alendronate 70 MG tablet Commonly known as: FOSAMAX Take 1 tablet (70 mg total) by mouth every 7 (seven) days. Take with a full glass of water on an empty stomach. What changed: additional instructions   alfuzosin 10 MG 24 hr tablet Commonly known as: UROXATRAL Take 1 tablet (10 mg total) by mouth daily with breakfast.   amLODipine 5 MG tablet Commonly known as: NORVASC TAKE 1 TABLET(5 MG) BY MOUTH DAILY What changed: See the new instructions.   apixaban 5 MG Tabs tablet Commonly known as: ELIQUIS Take 1 tablet (5 mg total) by mouth 2 (two) times daily.   aspirin EC 81 MG tablet Take 81 mg by mouth in the morning. Swallow whole.   Blue-Emu Hemp 10 % cream Generic drug: trolamine salicylate Apply 1 application topically as needed for muscle pain.   clopidogrel 75 MG tablet Commonly known as: Plavix Take 1 tablet (75 mg total) by mouth daily.   CoQ10 100 MG Caps Take 100 mg by mouth in the morning.   cyanocobalamin 1000 MCG tablet Commonly known as: VITAMIN B12 Take 1,000 mcg by mouth in the morning.   Farxiga 10 MG Tabs tablet Generic drug: dapagliflozin propanediol Take 10 mg by mouth in the morning.    ferrous sulfate 325 (65 FE) MG EC tablet Take 1 tablet (325 mg total) by mouth daily with breakfast.   furosemide 20 MG tablet Commonly known as: LASIX Take 20 mg by mouth 2 (two) times daily.   glipiZIDE 10 MG tablet Commonly known as: GLUCOTROL Take 10 mg by mouth 2 (two) times daily.   HYDROcodone-acetaminophen 10-325 MG tablet Commonly known as: NORCO Take 1 tablet by mouth every 6 (six) hours as needed for moderate pain.   Levemir FlexTouch 100 UNIT/ML FlexPen Generic drug: insulin detemir Inject 90 Units into the skin at bedtime.   lisinopril 20 MG tablet Commonly known as: ZESTRIL Take 1 tablet (20 mg total) by mouth daily. What changed: when to take this   Magnesium 400 MG Tabs Take 400 mg by mouth in the morning.   metFORMIN 500 MG 24 hr tablet Commonly known as: GLUCOPHAGE-XR Take 500 mg by mouth every evening. Take with 1000 Metformin   metFORMIN 1000  MG tablet Commonly known as: GLUCOPHAGE Take 1,000 mg by mouth 2 (two) times daily.   metoprolol tartrate 25 MG tablet Commonly known as: LOPRESSOR Take 1 tablet (25 mg total) by mouth 2 (two) times daily.   MOVE FREE PO Take 1 tablet by mouth in the morning and at bedtime.   Nac 600 600 MG Caps Generic drug: Acetylcysteine Take 1,200 mg by mouth in the morning.   nitroGLYCERIN 0.4 MG SL tablet Commonly known as: NITROSTAT Place 1 tablet (0.4 mg total) under the tongue every 5 (five) minutes x 3 doses as needed for chest pain (if no relief after 2nd dose, proceed to the ED for an evalution or call 911).   pantoprazole 40 MG tablet Commonly known as: PROTONIX Take 40 mg by mouth every evening.   potassium citrate 5 MEQ (540 MG) SR tablet Commonly known as: UROCIT-K Take 5 mEq by mouth 3 (three) times daily with meals.   QUERCETIN PO Take 1,000 mg by mouth in the morning.   rosuvastatin 20 MG tablet Commonly known as: CRESTOR Take 20 mg by mouth every evening.   tamsulosin 0.4 MG Caps  capsule Commonly known as: FLOMAX Take 0.4 mg by mouth in the morning and at bedtime.   vitamin C 1000 MG tablet Take 1,000 mg by mouth every morning.   Vitamin D3 125 MCG (5000 UT) Tabs Take 5,000 Units by mouth in the morning.   Vitamin K2 100 MCG Tabs Take 100 mcg by mouth in the morning.   zinc gluconate 50 MG tablet Take 50 mg by mouth in the morning.        Allergies: No Known Allergies  Family History: Family History  Problem Relation Age of Onset   Diabetes Mother    Heart attack Mother 49   Pulmonary embolism Father    Colon cancer Brother 94       Passed age 77 from colon ca   Gastric cancer Neg Hx    Esophageal cancer Neg Hx     Social History:  reports that he quit smoking about 17 years ago. His smoking use included cigarettes. He started smoking about 59 years ago. He has a 135.00 pack-year smoking history. He quit smokeless tobacco use about 16 years ago. He reports that he does not currently use alcohol. He reports that he does not use drugs.  ROS: All other review of systems were reviewed and are negative except what is noted above in HPI  Physical Exam: BP (!) 127/54   Pulse 68   Constitutional:  Alert and oriented, No acute distress. HEENT: [place] AT, moist mucus membranes.  Trachea midline, no masses. Cardiovascular: No clubbing, cyanosis, or edema. Respiratory: Normal respiratory effort, no increased work of breathing. GI: Abdomen is soft, nontender, nondistended, no abdominal masses GU: No CVA tenderness.  Lymph: No cervical or inguinal lymphadenopathy. Skin: No rashes, bruises or suspicious lesions. Neurologic: Grossly intact, no focal deficits, moving all 4 extremities. Psychiatric: Normal mood and affect.  Laboratory Data: Lab Results  Component Value Date   WBC 7.7 10/14/2021   HGB 12.1 (L) 10/14/2021   HCT 38.2 (L) 10/14/2021   MCV 95.0 10/14/2021   PLT 167 10/14/2021    Lab Results  Component Value Date   CREATININE 1.28 (H)  10/14/2021    Lab Results  Component Value Date   PSA 0.6 05/21/2016    No results found for: "TESTOSTERONE"  Lab Results  Component Value Date   HGBA1C 7.0 (H) 09/13/2021  Urinalysis    Component Value Date/Time   COLORURINE YELLOW 09/13/2021 0915   APPEARANCEUR CLEAR 09/13/2021 0915   APPEARANCEUR Clear 04/29/2021 1055   LABSPEC 1.012 09/13/2021 0915   PHURINE 5.0 09/13/2021 0915   GLUCOSEU >=500 (A) 09/13/2021 0915   HGBUR NEGATIVE 09/13/2021 0915   BILIRUBINUR NEGATIVE 09/13/2021 0915   BILIRUBINUR Negative 04/29/2021 1055   KETONESUR NEGATIVE 09/13/2021 0915   PROTEINUR NEGATIVE 09/13/2021 0915   UROBILINOGEN 0.2 10/21/2014 2350   NITRITE NEGATIVE 09/13/2021 0915   LEUKOCYTESUR NEGATIVE 09/13/2021 0915    Lab Results  Component Value Date   LABMICR See below: 04/29/2021   WBCUA None seen 04/29/2021   LABEPIT None seen 04/29/2021   MUCUS Present 04/29/2021   BACTERIA NONE SEEN 09/13/2021    Pertinent Imaging: CT 10/24/2021 and KUb today: Images reviewed and discussed with the patient  Results for orders placed during the hospital encounter of 04/24/21  Abdomen 1 view (KUB)  Narrative CLINICAL DATA:  Nephrolithiasis.  Left-sided kidney stone pain.  EXAM: ABDOMEN - 1 VIEW  COMPARISON:  October 10, 2020 KUB.  FINDINGS: Both kidneys are partially obscured by bowel contents. No renal stones are identified. No ureteral stones identified. Phleboliths are identified in the pelvis. No other significant bony or soft tissue abnormalities are identified.  IMPRESSION: 1. No renal or ureteral stones identified. The kidneys are partially obscured by bowel contents. 2. No other significant abnormalities.   Electronically Signed By: Dorise Bullion III M.D. On: 04/26/2021 12:59  Results for orders placed during the hospital encounter of 10/21/21  US Venous Img Lower Bilateral (DVT)  Narrative CLINICAL DATA:  Pain and swelling  EXAM: Bilateral lower  Extremity Venous Doppler Ultrasound  TECHNIQUE: Gray-scale sonography with compression, as well as color and duplex ultrasound, were performed to evaluate the deep venous system(s) from the level of the common femoral vein through the popliteal and proximal calf veins.  COMPARISON:  None available  FINDINGS: VENOUS  Normal compressibility of the common femoral, superficial femoral, and popliteal veins, as well as the visualized calf veins. Visualized portions of profunda femoral vein and great saphenous vein unremarkable. No filling defects to suggest DVT on grayscale or color Doppler imaging. Doppler waveforms show normal direction of venous flow, normal respiratory plasticity and response to augmentation.  OTHER  Hypoechoic rounded structure noted in the left inguinal region measuring 2.2 x 1.6 cm. There is questionable minimal flow within this structure.  Limitations: none  IMPRESSION: 1. No lower extremity DVT. 2. Hypoechoic rounded structure noted in the left inguinal region, adjacent to the proximal superficial femoral artery, measuring 2.2 x 1.6 cm. There is questionable minimal flow within this structure. Differential diagnosis includes nearly completely thrombosed pseudoaneurysm, hematoma, or pathologically enlarged lymph node. Further evaluation with CTA should be considered.  These results will be called to the ordering clinician or representative by the Radiologist Assistant, and communication documented in the PACS or [employer].   Electronically Signed By: Miachel Roux M.D. On: 10/21/2021 13:50  No results found for this or any previous visit.  No results found for this or any previous visit.  Results for orders placed during the hospital encounter of 04/11/19  US RENAL  Narrative CLINICAL DATA:  Chronic kidney disease, stage IIIB.  EXAM: RENAL / URINARY TRACT ULTRASOUND COMPLETE  COMPARISON:  CT scan of the abdomen and pelvis dated  06/08/2016  FINDINGS: Right Kidney:  Renal measurements: 13 x 7.6 x 7.1 cm = volume: 366 mL . Echogenicity within  normal limits. No mass or hydronephrosis visualized.  Left Kidney:  Renal measurements: 13.3 x 6.7 x 5.4 cm = volume: 247 mL. Echogenicity within normal limits. No mass or hydronephrosis visualized. 11 mm stone in the lower pole of the left kidney.  Bladder:  Appears normal for degree of bladder distention. Bilateral ureteral jets identified.  Other:  None.  IMPRESSION: Stone in the lower pole of the left kidney.  Otherwise normal exam.   Electronically Signed By: Lorriane Shire M.D. On: 04/11/2019 15:47  No valid procedures specified. No results found for this or any previous visit.  Results for orders placed during the hospital encounter of 12/07/13  CT RENAL STONE STUDY  Narrative CLINICAL DATA:  Acute onset of right flank pain.  Initial encounter.  EXAM: CT ABDOMEN AND PELVIS WITHOUT CONTRAST  TECHNIQUE: Multidetector CT imaging of the abdomen and pelvis was performed following the standard protocol without IV contrast.  COMPARISON:  None.  FINDINGS: The visualized lung bases are clear. Diffuse coronary artery calcifications are seen.  The liver and spleen are unremarkable in appearance. The gallbladder is within normal limits. The pancreas and adrenal glands are unremarkable.  Minimal right-sided hydronephrosis is noted, with two obstructing stones noted just below the right renal pelvis, in the proximal right ureter. These measure 6 x 5 mm more proximally, and 4 x 3 mm more distally.  A few nonobstructing stones are noted at the lower pole of the left kidney, with mild associated scarring. These measure up to 5 mm in size. Mild perinephric stranding is noted bilaterally, more prominent on the right.  No free fluid is identified. The small bowel is unremarkable in appearance. The stomach is within normal limits. No acute  vascular abnormalities are seen.  The patient is status post appendectomy. Scattered diverticulosis is noted along the entirety of the colon, without evidence of diverticulitis. Note is made of a small anterior abdominal wall hernia just to the left of midline, containing a short segment of transverse colon. There is no evidence of obstruction. The colon is otherwise unremarkable.  Postoperative change is noted along the anterior midline abdomen. A tiny umbilical hernia is also seen, containing only fat.  The bladder is mildly distended and grossly unremarkable. The prostate is mildly enlarged, measuring 5.2 cm in transverse dimension, with minimal calcification. No inguinal lymphadenopathy is seen.  No acute osseous abnormalities are identified.  IMPRESSION: 1. Minimal right-sided hydronephrosis, with two obstructing stones noted just below the right renal pelvis, in the proximal right ureter. These measure 6 x 5 mm more proximally, and 4 x 3 mm more distally. 2. Few nonobstructing stones at the lower pole of the left kidney, with mild associated scarring. 3. Diffuse coronary artery calcifications seen. 4. Scattered diverticulosis along the entirety of the colon, without evidence of diverticulitis. 5. Short segment of transverse colon herniating into a small anterior abdominal wall hernia just to the left of midline, without evidence of obstruction. 6. Tiny umbilical hernia, containing only fat. 7. Mildly enlarged prostate noted.   Electronically Signed By: Garald Balding M.D. On: 12/07/2013 06:05   Assessment & Plan:    1. Nephrolithiasis -COntinue observation -Followup 6 months with KUB - Urinalysis, Routine w reflex microscopic  2. Benign prostatic hyperplasia, unspecified whether lower urinary tract symptoms present -Raqpaflo '8mg'$  daily  3. Weak urinary stream Rapaflo '8mg'$  aily   No follow-ups on file.  Nicolette Bang, MD  [facility]  [employer]

## 2021-10-29 NOTE — Patient Instructions (Signed)

## 2021-10-30 ENCOUNTER — Encounter (HOSPITAL_COMMUNITY)
Admission: RE | Admit: 2021-10-30 | Discharge: 2021-10-30 | Disposition: A | Discharge status: Home / Self Care | Payer: Medicare HMO | Source: Ambulatory Visit | Attending: Cardiovascular Disease | Admitting: Cardiovascular Disease

## 2021-10-30 DIAGNOSIS — R1900 Intra-abdominal and pelvic swelling, mass and lump, unspecified site: Secondary | ICD-10-CM | POA: Diagnosis not present

## 2021-10-30 DIAGNOSIS — Z952 Presence of prosthetic heart valve: Secondary | ICD-10-CM | POA: Diagnosis not present

## 2021-10-30 NOTE — Progress Notes (Signed)
Daily Session Note  Patient Details  Name: William [name] MRN: [id number] Date of Birth: Jan 25, 1945 Referring Provider:   Flowsheet Row CARDIAC REHAB PHASE II ORIENTATION from 10/24/2021 in [place]  Referring Provider Dr. Angelena Form       Encounter Date: 10/30/2021  Check In:  Session Check In - 10/30/21 0815       Check-In   Supervising physician immediately available to respond to emergencies [facility] MD immediately available    Physician(s) Dr. Marlou Porch    Location [facility]    Staff Present Hoy Register MHA, MS, ACSM-CEP;Madelyn Flavors, RN, BSN;Heather Otho Ket, BS, Exercise Physiologist    Virtual Visit No    Medication changes reported     No    Fall or balance concerns reported    Yes    Comments He has not fallen but he does lose his balance often. His arthritis in his knees also causes his knees to feel weak at times.    Tobacco Cessation No Change    Warm-up and Cool-down Performed as group-led instruction    Resistance Training Performed Yes    VAD Patient? No    PAD/SET Patient? No      Pain Assessment   Currently in Pain? No/denies    Multiple Pain Sites No             Capillary Blood Glucose: Results for orders placed or performed in visit on 10/29/21 (from the past 24 hour(s))  Urinalysis, Routine w reflex microscopic     Status: Abnormal   Collection Time: 10/29/21  8:36 AM  Result Value Ref Range   Specific Gravity, UA 1.010 1.005 - 1.030   pH, UA 6.0 5.0 - 7.5   Color, UA Yellow Yellow   Appearance Ur Clear Clear   Leukocytes,UA Negative Negative   Protein,UA Negative Negative/Trace   Glucose, UA 3+ (A) Negative   Ketones, UA Negative Negative   RBC, UA Negative Negative   Bilirubin, UA Negative Negative   Urobilinogen, Ur 0.2 0.2 - 1.0 mg/dL   Nitrite, UA Negative Negative   Microscopic Examination See below:    Narrative   Performed at:  [facility] [address],  [place], [place]  [id number] Lab Director: Mina Marble MT, Phone:  [phone]  Microscopic Examination     Status: None   Collection Time: 10/29/21  8:36 AM   Urine  Result Value Ref Range   WBC, UA None seen 0 - 5 /hpf   RBC, Urine 0-2 0 - 2 /hpf   Epithelial Cells (non renal) None seen 0 - 10 /hpf   Bacteria, UA None seen None seen/Few   Narrative   Performed at:  [address] [facility] [place] [address] [place], [place]  [id number] Lab Director: [place], Phone:  [phone]      Social History   Tobacco Use  Smoking Status Former   Packs/day: 3.00   Years: 45.00   Total pack years: 135.00   Types: Cigarettes   Start date: 01/06/1962   Quit date: 10/24/2004   Years since quitting: 17.0  Smokeless Tobacco Former   Quit date: 08/10/2005    Goals Met:  Independence with exercise equipment Exercise tolerated well No report of concerns or symptoms today Strength training completed today  Goals Unmet:  Not Applicable  Comments: checkout time is 0915   Dr. Carlyle Dolly is Medical Director for [facility]

## 2021-11-01 ENCOUNTER — Encounter (HOSPITAL_COMMUNITY)
Admission: RE | Admit: 2021-11-01 | Discharge: 2021-11-01 | Disposition: A | Discharge status: Home / Self Care | Payer: Medicare HMO | Source: Ambulatory Visit | Attending: Cardiovascular Disease | Admitting: Cardiovascular Disease

## 2021-11-01 DIAGNOSIS — Z952 Presence of prosthetic heart valve: Secondary | ICD-10-CM | POA: Diagnosis not present

## 2021-11-01 DIAGNOSIS — R1900 Intra-abdominal and pelvic swelling, mass and lump, unspecified site: Secondary | ICD-10-CM | POA: Diagnosis not present

## 2021-11-01 NOTE — Progress Notes (Signed)
Daily Session Note  Patient Details  Name: William [name] MRN: [id number] Date of Birth: 22-Jul-1945 Referring Provider:   Flowsheet Row CARDIAC REHAB PHASE II ORIENTATION from 10/24/2021 in [name]  Referring Provider Dr. Angelena Form       Encounter Date: 11/01/2021  Check In:  Session Check In - 11/01/21 0815       Check-In   Supervising physician immediately available to respond to emergencies [facility] MD immediately available    Physician(s) Domenic Polite    Location [facility]    Staff Present Hoy Register MHA, MS, ACSM-CEP;Leana Roe, BS, Exercise Physiologist;Arkeem Harts Wynetta Emery, RN, BSN    Virtual Visit No    Medication changes reported     No    Fall or balance concerns reported    Yes    Comments He has not fallen but he does lose his balance often. His arthritis in his knees also causes his knees to feel weak at times.    Tobacco Cessation No Change    Warm-up and Cool-down Performed as group-led instruction    Resistance Training Performed Yes    VAD Patient? No    PAD/SET Patient? No      Pain Assessment   Currently in Pain? No/denies    Pain Score 0-No pain    Multiple Pain Sites No             Capillary Blood Glucose: No results found for this or any previous visit (from the past 24 hour(s)).    Social History   Tobacco Use  Smoking Status Former   Packs/day: 3.00   Years: 45.00   Total pack years: 135.00   Types: Cigarettes   Start date: 01/06/1962   Quit date: 10/24/2004   Years since quitting: 17.0  Smokeless Tobacco Former   Quit date: 08/10/2005    Goals Met:  Independence with exercise equipment Exercise tolerated well No report of concerns or symptoms today Strength training completed today  Goals Unmet:  Not Applicable  Comments: Check out 915.   Dr. Carlyle Dolly is Medical Director for [facility]

## 2021-11-04 ENCOUNTER — Encounter (HOSPITAL_COMMUNITY)
Admission: RE | Admit: 2021-11-04 | Discharge: 2021-11-04 | Disposition: A | Discharge status: Home / Self Care | Payer: Medicare HMO | Source: Ambulatory Visit | Attending: Cardiovascular Disease | Admitting: Cardiovascular Disease

## 2021-11-04 ENCOUNTER — Ambulatory Visit: Payer: Medicare HMO

## 2021-11-04 DIAGNOSIS — Z952 Presence of prosthetic heart valve: Secondary | ICD-10-CM

## 2021-11-04 DIAGNOSIS — R1900 Intra-abdominal and pelvic swelling, mass and lump, unspecified site: Secondary | ICD-10-CM | POA: Diagnosis not present

## 2021-11-04 NOTE — Progress Notes (Signed)
Daily Session Note  Patient Details  Name: William [name] MRN: [id number] Date of Birth: 13-Jul-1945 Referring Provider:   Flowsheet Row CARDIAC REHAB PHASE II ORIENTATION from 10/24/2021 in [place]  Referring Provider Dr. Angelena Form       Encounter Date: 11/04/2021  Check In:  Session Check In - 11/04/21 0846       Check-In   Supervising physician immediately available to respond to emergencies [facility] MD immediately available    Physician(s) Dr. Domenic Polite    Location [facility]    Staff Present Hoy Register MHA, MS, ACSM-CEP;Leana Roe, BS, Exercise Physiologist;Debra Wynetta Emery, RN, BSN;Mardelle Pandolfi, RN    Virtual Visit No    Medication changes reported     No    Fall or balance concerns reported    Yes    Comments He has not fallen but he does lose his balance often. His arthritis in his knees also causes his knees to feel weak at times.    Tobacco Cessation No Change    Warm-up and Cool-down Performed as group-led instruction    Resistance Training Performed Yes    VAD Patient? No    PAD/SET Patient? No      Pain Assessment   Currently in Pain? No/denies    Pain Score 0-No pain    Multiple Pain Sites No             Capillary Blood Glucose: No results found for this or any previous visit (from the past 24 hour(s)).    Social History   Tobacco Use  Smoking Status Former   Packs/day: 3.00   Years: 45.00   Total pack years: 135.00   Types: Cigarettes   Start date: 01/06/1962   Quit date: 10/24/2004   Years since quitting: 17.0  Smokeless Tobacco Former   Quit date: 08/10/2005    Goals Met:  Independence with exercise equipment Exercise tolerated well No report of concerns or symptoms today Strength training completed today  Goals Unmet:  Not Applicable  Comments: check out @ 9:15am   Dr. Carlyle Dolly is Medical Director for [facility]

## 2021-11-06 ENCOUNTER — Telehealth: Payer: Self-pay | Admitting: *Deleted

## 2021-11-06 ENCOUNTER — Other Ambulatory Visit: Payer: Self-pay | Admitting: *Deleted

## 2021-11-06 ENCOUNTER — Encounter (HOSPITAL_COMMUNITY)
Admission: RE | Admit: 2021-11-06 | Discharge: 2021-11-06 | Disposition: A | Discharge status: Home / Self Care | Payer: Medicare HMO | Source: Ambulatory Visit | Attending: Cardiovascular Disease | Admitting: Cardiovascular Disease

## 2021-11-06 DIAGNOSIS — D509 Iron deficiency anemia, unspecified: Secondary | ICD-10-CM

## 2021-11-06 DIAGNOSIS — Z952 Presence of prosthetic heart valve: Secondary | ICD-10-CM | POA: Diagnosis not present

## 2021-11-06 NOTE — Progress Notes (Signed)
Daily Session Note  Patient Details  Name: MUAD NOGA MRN: [id number] Date of Birth: 11-27-1945 Referring Provider:   Flowsheet Row CARDIAC REHAB PHASE II ORIENTATION from 10/24/2021 in [place]  Referring Provider Dr. Angelena Form       Encounter Date: 11/06/2021  Check In:  Session Check In - 11/06/21 0815       Check-In   Supervising physician immediately available to respond to emergencies [facility] MD immediately available    Physician(s) Dr. Domenic Polite    Location [facility]    Staff Present Hoy Register MHA, MS, ACSM-CEP;Leana Roe, BS, Exercise Physiologist    Virtual Visit No    Medication changes reported     No    Fall or balance concerns reported    Yes    Comments He has not fallen but he does lose his balance often. His arthritis in his knees also causes his knees to feel weak at times.    Tobacco Cessation No Change    Warm-up and Cool-down Performed as group-led instruction    Resistance Training Performed Yes    VAD Patient? No    PAD/SET Patient? No      Pain Assessment   Currently in Pain? No/denies    Pain Score 0-No pain    Multiple Pain Sites No             Capillary Blood Glucose: No results found for this or any previous visit (from the past 24 hour(s)).    Social History   Tobacco Use  Smoking Status Former   Packs/day: 3.00   Years: 45.00   Total pack years: 135.00   Types: Cigarettes   Start date: 01/06/1962   Quit date: 10/24/2004   Years since quitting: 17.0  Smokeless Tobacco Former   Quit date: 08/10/2005    Goals Met:  Independence with exercise equipment Exercise tolerated well No report of concerns or symptoms today Strength training completed today  Goals Unmet:  Not Applicable  Comments: check out 0915   Dr. Carlyle Dolly is Medical Director for [facility]

## 2021-11-06 NOTE — Telephone Encounter (Signed)
Mailed lab requisitions

## 2021-11-06 NOTE — Telephone Encounter (Signed)
Noted  

## 2021-11-08 ENCOUNTER — Encounter (HOSPITAL_COMMUNITY): Payer: Medicare HMO

## 2021-11-08 DIAGNOSIS — N189 Chronic kidney disease, unspecified: Secondary | ICD-10-CM | POA: Diagnosis not present

## 2021-11-08 DIAGNOSIS — R809 Proteinuria, unspecified: Secondary | ICD-10-CM | POA: Diagnosis not present

## 2021-11-08 DIAGNOSIS — M1991 Primary osteoarthritis, unspecified site: Secondary | ICD-10-CM | POA: Diagnosis not present

## 2021-11-08 DIAGNOSIS — M150 Primary generalized (osteo)arthritis: Secondary | ICD-10-CM | POA: Diagnosis not present

## 2021-11-08 DIAGNOSIS — N1831 Chronic kidney disease, stage 3a: Secondary | ICD-10-CM | POA: Diagnosis not present

## 2021-11-08 DIAGNOSIS — I480 Paroxysmal atrial fibrillation: Secondary | ICD-10-CM | POA: Diagnosis not present

## 2021-11-08 DIAGNOSIS — E1122 Type 2 diabetes mellitus with diabetic chronic kidney disease: Secondary | ICD-10-CM | POA: Diagnosis not present

## 2021-11-08 DIAGNOSIS — Z6834 Body mass index (BMI) 34.0-34.9, adult: Secondary | ICD-10-CM | POA: Diagnosis not present

## 2021-11-08 DIAGNOSIS — E119 Type 2 diabetes mellitus without complications: Secondary | ICD-10-CM | POA: Diagnosis not present

## 2021-11-08 DIAGNOSIS — N200 Calculus of kidney: Secondary | ICD-10-CM | POA: Diagnosis not present

## 2021-11-08 DIAGNOSIS — N183 Chronic kidney disease, stage 3 unspecified: Secondary | ICD-10-CM | POA: Diagnosis not present

## 2021-11-08 DIAGNOSIS — E1129 Type 2 diabetes mellitus with other diabetic kidney complication: Secondary | ICD-10-CM | POA: Diagnosis not present

## 2021-11-11 ENCOUNTER — Encounter (HOSPITAL_COMMUNITY)
Admission: RE | Admit: 2021-11-11 | Discharge: 2021-11-11 | Disposition: A | Discharge status: Home / Self Care | Payer: Medicare HMO | Source: Ambulatory Visit | Attending: Cardiovascular Disease | Admitting: Cardiovascular Disease

## 2021-11-11 VITALS — Wt 227.7 lb

## 2021-11-11 DIAGNOSIS — Z952 Presence of prosthetic heart valve: Secondary | ICD-10-CM

## 2021-11-11 NOTE — Progress Notes (Signed)
Daily Session Note  Patient Details  Name: William [name] MRN: [id number] Date of Birth: [birth year] Referring Provider:   Flowsheet Row CARDIAC REHAB PHASE II ORIENTATION from 10/24/2021 in [place]  Referring Provider Dr. Angelena Form       Encounter Date: 11/11/2021  Check In:  Session Check In - 11/11/21 0815       Check-In   Supervising physician immediately available to respond to emergencies [facility] MD immediately available    Physician(s) Dr. Dellia Cloud    Location [facility]    Staff Present Daphyne Hassell Done, RN, BSN;Chrystopher Stangl Sherrie George, MS, ACSM-CEP;Leana Roe, BS, Exercise Physiologist    Virtual Visit No    Medication changes reported     No    Fall or balance concerns reported    Yes    Comments He has not fallen but he does lose his balance often. His arthritis in his knees also causes his knees to feel weak at times.    Tobacco Cessation No Change    Warm-up and Cool-down Performed as group-led instruction    Resistance Training Performed Yes    VAD Patient? No    PAD/SET Patient? No      Pain Assessment   Currently in Pain? No/denies    Pain Score 0-No pain    Multiple Pain Sites No             Capillary Blood Glucose: No results found for this or any previous visit (from the past 24 hour(s)).    Social History   Tobacco Use  Smoking Status Former   Packs/day: 3.00   Years: 45.00   Total pack years: 135.00   Types: Cigarettes   Start date: 01/06/1962   Quit date: 10/24/2004   Years since quitting: 17.0  Smokeless Tobacco Former   Quit date: 08/10/2005    Goals Met:  Independence with exercise equipment Exercise tolerated well No report of concerns or symptoms today Strength training completed today  Goals Unmet:  Not Applicable  Comments: checkout time is 0915   Dr. Carlyle Dolly is Medical Director for [facility]

## 2021-11-13 ENCOUNTER — Encounter (HOSPITAL_COMMUNITY)
Admission: RE | Admit: 2021-11-13 | Discharge: 2021-11-13 | Disposition: A | Discharge status: Home / Self Care | Payer: Medicare HMO | Source: Ambulatory Visit | Attending: Cardiovascular Disease | Admitting: Cardiovascular Disease

## 2021-11-13 DIAGNOSIS — Z952 Presence of prosthetic heart valve: Secondary | ICD-10-CM

## 2021-11-13 NOTE — Progress Notes (Signed)
Daily Session Note  Patient Details  Name: William [name] MRN: [id number] Date of Birth: 1945/02/11 Referring Provider:   Flowsheet Row CARDIAC REHAB PHASE II ORIENTATION from 10/24/2021 in [place]  Referring Provider Dr. Angelena Form       Encounter Date: 11/13/2021  Check In:  Session Check In - 11/13/21 0815       Check-In   Supervising physician immediately available to respond to emergencies [facility] MD immediately available    Physician(s) Dr. Dellia Cloud    Location [facility]    Staff Present Leana Roe, BS, Exercise Physiologist;Amrutha [facility] Sherrie George, MS, ACSM-CEP    Virtual Visit No    Medication changes reported     No    Fall or balance concerns reported    Yes    Comments He has not fallen but he does lose his balance often. His arthritis in his knees also causes his knees to feel weak at times.    Tobacco Cessation No Change    Warm-up and Cool-down Performed as group-led instruction    Resistance Training Performed Yes    VAD Patient? No    PAD/SET Patient? No      Pain Assessment   Currently in Pain? No/denies    Pain Score 0-No pain    Multiple Pain Sites No             Capillary Blood Glucose: No results found for this or any previous visit (from the past 24 hour(s)).    Social History   Tobacco Use  Smoking Status Former   Packs/day: 3.00   Years: 45.00   Total pack years: 135.00   Types: Cigarettes   Start date: 01/06/1962   Quit date: 10/24/2004   Years since quitting: 17.0  Smokeless Tobacco Former   Quit date: 08/10/2005    Goals Met:  Independence with exercise equipment Exercise tolerated well No report of concerns or symptoms today Strength training completed today  Goals Unmet:  Not Applicable  Comments: checkout time is 0915   Dr. Carlyle Dolly is Medical Director for [facility]

## 2021-11-13 NOTE — Progress Notes (Signed)
Cardiac Individual Treatment Plan  Patient Details  Name: William [name] MRN: [id number] Date of Birth: [birth year] Referring Provider:   Flowsheet Row CARDIAC REHAB PHASE II ORIENTATION from 10/24/2021 in [place]  Referring Provider Dr. Angelena Form       Initial Encounter Date:  Flowsheet Row CARDIAC REHAB PHASE II ORIENTATION from 10/24/2021 in [place]  Date 10/24/21       Visit Diagnosis: S/P TAVR (transcatheter aortic valve replacement)  Patient's Home Medications on Admission:  Current Outpatient Medications:    acarbose (PRECOSE) 100 MG tablet, Take 100 mg by mouth 3 (three) times daily with meals. , Disp: , Rfl:    ACCU-CHEK GUIDE test strip, , Disp: , Rfl:    Accu-Chek Softclix Lancets lancets, 1 each 3 (three) times daily., Disp: , Rfl:    Acetylcysteine (NAC 600) 600 MG CAPS, Take 1,200 mg by mouth in the morning., Disp: , Rfl:    alendronate (FOSAMAX) 70 MG tablet, Take 1 tablet (70 mg total) by mouth every 7 (seven) days. Take with a full glass of water on an empty stomach. (Patient taking differently: Take 70 mg by mouth every 7 (seven) days. Take with a full glass of water on an empty stomach. Taken [employer]), Disp: 4 tablet, Rfl: 11   amLODipine (NORVASC) 5 MG tablet, TAKE 1 TABLET(5 MG) BY MOUTH DAILY (Patient taking differently: Take 5 mg by mouth every evening.), Disp: 90 tablet, Rfl: 0   apixaban (ELIQUIS) 5 MG TABS tablet, Take 1 tablet (5 mg total) by mouth 2 (two) times daily., Disp: 60 tablet, Rfl: 2   Ascorbic Acid (VITAMIN C) 1000 MG tablet, Take 1,000 mg by mouth every morning., Disp: , Rfl:    aspirin EC 81 MG tablet, Take 81 mg by mouth in the morning. Swallow whole., Disp: , Rfl:    Cholecalciferol (VITAMIN D3) 125 MCG (5000 UT) TABS, Take 5,000 Units by mouth in the morning., Disp: , Rfl:    clopidogrel (PLAVIX) 75 MG tablet, Take 1 tablet (75 mg total) by mouth daily., Disp: 90 tablet, Rfl: 3    Coenzyme Q10 (COQ10) 100 MG CAPS, Take 100 mg by mouth in the morning., Disp: , Rfl:    FARXIGA 10 MG TABS tablet, Take 10 mg by mouth in the morning., Disp: , Rfl:    ferrous sulfate 325 (65 FE) MG EC tablet, Take 1 tablet (325 mg total) by mouth daily with breakfast., Disp: 30 tablet, Rfl: 3   furosemide (LASIX) 20 MG tablet, Take 20 mg by mouth 2 (two) times daily. , Disp: , Rfl: 0   glipiZIDE (GLUCOTROL) 10 MG tablet, Take 10 mg by mouth 2 (two) times daily., Disp: , Rfl:    Glucosamine-Chondroitin (MOVE FREE PO), Take 1 tablet by mouth in the morning and at bedtime., Disp: , Rfl:    HYDROcodone-acetaminophen (NORCO) 10-325 MG tablet, Take 1 tablet by mouth every 6 (six) hours as needed for moderate pain., Disp: , Rfl:    LEVEMIR FLEXTOUCH 100 UNIT/ML Pen, Inject 90 Units into the skin at bedtime. , Disp: , Rfl:    lisinopril (ZESTRIL) 20 MG tablet, Take 1 tablet (20 mg total) by mouth daily. (Patient taking differently: Take 20 mg by mouth every evening.), Disp: 90 tablet, Rfl: 1   Magnesium 400 MG TABS, Take 400 mg by mouth in the morning., Disp: , Rfl:    Menatetrenone (VITAMIN K2) 100 MCG TABS, Take 100 mcg by mouth in the morning., Disp: ,  Rfl:    metFORMIN (GLUCOPHAGE) 1000 MG tablet, Take 1,000 mg by mouth 2 (two) times daily., Disp: , Rfl: 3   metFORMIN (GLUCOPHAGE-XR) 500 MG 24 hr tablet, Take 500 mg by mouth every evening. Take with 1000 Metformin, Disp: , Rfl:    metoprolol tartrate (LOPRESSOR) 25 MG tablet, Take 1 tablet (25 mg total) by mouth 2 (two) times daily., Disp: 60 tablet, Rfl: 2   nitroGLYCERIN (NITROSTAT) 0.4 MG SL tablet, Place 1 tablet (0.4 mg total) under the tongue every 5 (five) minutes x 3 doses as needed for chest pain (if no relief after 2nd dose, proceed to the ED for an evalution or call 911)., Disp: 75 tablet, Rfl: 2   pantoprazole (PROTONIX) 40 MG tablet, Take 40 mg by mouth every evening. , Disp: , Rfl:    potassium citrate (UROCIT-K) 5 MEQ (540 MG) SR tablet,  Take 5 mEq by mouth 3 (three) times daily with meals., Disp: , Rfl:    QUERCETIN PO, Take 1,000 mg by mouth in the morning., Disp: , Rfl:    rosuvastatin (CRESTOR) 20 MG tablet, Take 20 mg by mouth every evening. , Disp: , Rfl:    silodosin (RAPAFLO) 8 MG CAPS capsule, Take 1 capsule (8 mg total) by mouth daily with breakfast., Disp: 90 capsule, Rfl: 3   trolamine salicylate (BLUE-EMU HEMP) 10 % cream, Apply 1 application topically as needed for muscle pain., Disp: , Rfl:    vitamin B-12 (CYANOCOBALAMIN) 1000 MCG tablet, Take 1,000 mcg by mouth in the morning., Disp: , Rfl:    zinc gluconate 50 MG tablet, Take 50 mg by mouth in the morning., Disp: , Rfl:   Past Medical History: Past Medical History:  Diagnosis Date   Anxiety    Aortic stenosis    Arthritis    CAD (coronary artery disease)    a. s/p DES to distal RCA in 08/2013, DES to LAD 07/2021   Cancer [facility])    prostate   CKD (chronic kidney disease)    Diabetes mellitus without complication (HCC)    Family history of colon cancer    GERD (gastroesophageal reflux disease)    Heart murmur    Hypercalcemia    Hypercholesteremia    Hypertension    Kidney stone    PAF (paroxysmal atrial fibrillation) (HCC)    Personal history of colonic polyps    Pneumonia    S/P TAVR (transcatheter aortic valve replacement) 09/17/2021   s/p TAVR with a 23 mm Edwards S3UR via the TF approach by Dr. Angelena Form & Bartle    Tobacco Use: Social History   Tobacco Use  Smoking Status Former   Packs/day: 3.00   Years: 45.00   Total pack years: 135.00   Types: Cigarettes   Start date: 01/06/1962   Quit date: 10/24/2004   Years since quitting: 17.0  Smokeless Tobacco Former   Quit date: 08/10/2005    Labs: Review Flowsheet  More data exists      Latest Ref Rng & Units 08/06/2009 10/21/2014 07/15/2021 09/13/2021 09/17/2021  Labs for ITP Cardiac and Pulmonary Rehab  Hemoglobin A1c 4.8 - 5.6 % - - - 7.0  -  PH, Arterial 7.35 - 7.45 - - 7.367  7.372  -  -  PCO2 arterial 32 - 48 mmHg - - 36.8  35.9  - -  Bicarbonate 20.0 - 28.0 mmol/L - - 24.2  21.1  20.9  - -  TCO2 22 - 32 mmol/L 26  25  26  22  22  - 23   Acid-base deficit 0.0 - 2.0 mmol/L - - 2.0  4.0  4.0  - -  O2 Saturation % - - 60  97  98  - -    Capillary Blood Glucose: Lab Results  Component Value Date   GLUCAP 235 (H) 09/23/2021   GLUCAP 187 (H) 09/23/2021   GLUCAP 180 (H) 09/22/2021   GLUCAP 174 (H) 09/22/2021   GLUCAP 170 (H) 09/22/2021    POCT Glucose     Row Name 10/24/21 0912             POCT Blood Glucose   Pre-Exercise 116 mg/dL                Exercise Target Goals: Exercise Program Goal: Individual exercise prescription set using results from initial 6 min walk test and THRR while considering  patient's activity barriers and safety.   Exercise Prescription Goal: Starting with aerobic activity 30 plus minutes a day, 3 days per week for initial exercise prescription. Provide home exercise prescription and guidelines that participant acknowledges understanding prior to discharge.  Activity Barriers & Risk Stratification:  Activity Barriers & Cardiac Risk Stratification - 10/24/21 0839       Activity Barriers & Cardiac Risk Stratification   Activity Barriers Arthritis;Back Problems;Deconditioning;Shortness of Breath;Chest Pain/Angina;Balance Concerns    Cardiac Risk Stratification High             6 Minute Walk:  6 Minute Walk     Row Name 10/24/21 1003         6 Minute Walk   Phase Initial     Distance 1100 feet     Walk Time 6 minutes     # of Rest Breaks 0     MPH 2.03     METS 1.75     RPE 13     VO2 Peak 6.15     Symptoms No     Resting HR 66 bpm     Resting BP 116/54     Resting Oxygen Saturation  97 %     Exercise Oxygen Saturation  during 6 min walk 98 %     Max Ex. HR 84 bpm     Max Ex. BP 136/54     2 Minute Post BP 120/54              Oxygen Initial Assessment:   Oxygen Re-Evaluation:   Oxygen  Discharge (Final Oxygen Re-Evaluation):   Initial Exercise Prescription:  Initial Exercise Prescription - 10/24/21 1000       Date of Initial Exercise RX and Referring Provider   Date 10/24/21    Referring Provider Dr. Angelena Form    Expected Discharge Date 01/17/22      NuStep   Level 1    SPM 60    Minutes 17      Arm Ergometer   Level 1    RPM 45    Minutes 22      Prescription Details   Frequency (times per week) 3    Duration Progress to 30 minutes of continuous aerobic without signs/symptoms of physical distress      Intensity   THRR 40-80% of Max Heartrate 58-116    Ratings of Perceived Exertion 11-13    Perceived Dyspnea 0-4      Resistance Training   Training Prescription Yes    Weight 3    Reps 10-15  Perform Capillary Blood Glucose checks as needed.  Exercise Prescription Changes:   Exercise Prescription Changes     Row Name 10/28/21 1200 11/11/21 1300           Response to Exercise   Blood Pressure (Admit) 120/62 102/52      Blood Pressure (Exercise) 144/56 110/60      Blood Pressure (Exit) 126/62 100/50      Heart Rate (Admit) 67 bpm 65 bpm      Heart Rate (Exercise) 93 bpm 92 bpm      Heart Rate (Exit) 76 bpm 74 bpm      Rating of Perceived Exertion (Exercise) 13 12      Duration Continue with 30 min of aerobic exercise without signs/symptoms of physical distress. Continue with 30 min of aerobic exercise without signs/symptoms of physical distress.      Intensity THRR unchanged THRR unchanged        Progression   Progression Continue to progress workloads to maintain intensity without signs/symptoms of physical distress. Continue to progress workloads to maintain intensity without signs/symptoms of physical distress.        Resistance Training   Training Prescription Yes Yes      Weight 2 2      Reps 10-15 10-15      Time 10 Minutes 10 Minutes        NuStep   Level 1 2      SPM 66 123      Minutes 17 22      METs 1.75  2.83        Arm Ergometer   Level 1 2      RPM 120 56      Minutes 22 17      METs 2.98 2.06               Exercise Comments:   Exercise Goals and Review:   Exercise Goals     Row Name 10/24/21 1008 11/11/21 1321           Exercise Goals   Increase Physical Activity Yes Yes      Intervention Provide advice, education, support and counseling about physical activity/exercise needs.;Develop an individualized exercise prescription for aerobic and resistive training based on initial evaluation findings, risk stratification, comorbidities and participant's personal goals. Provide advice, education, support and counseling about physical activity/exercise needs.;Develop an individualized exercise prescription for aerobic and resistive training based on initial evaluation findings, risk stratification, comorbidities and participant's personal goals.      Expected Outcomes Short Term: Attend rehab on a regular basis to increase amount of physical activity.;Long Term: Add in home exercise to make exercise part of routine and to increase amount of physical activity.;Long Term: Exercising regularly at least 3-5 days a week. Short Term: Attend rehab on a regular basis to increase amount of physical activity.;Long Term: Add in home exercise to make exercise part of routine and to increase amount of physical activity.;Long Term: Exercising regularly at least 3-5 days a week.      Increase Strength and Stamina Yes Yes      Intervention Provide advice, education, support and counseling about physical activity/exercise needs.;Develop an individualized exercise prescription for aerobic and resistive training based on initial evaluation findings, risk stratification, comorbidities and participant's personal goals. Provide advice, education, support and counseling about physical activity/exercise needs.;Develop an individualized exercise prescription for aerobic and resistive training based on initial  evaluation findings, risk stratification, comorbidities and participant's personal goals.  Expected Outcomes Short Term: Increase workloads from initial exercise prescription for resistance, speed, and METs.;Short Term: Perform resistance training exercises routinely during rehab and add in resistance training at home;Long Term: Improve cardiorespiratory fitness, muscular endurance and strength as measured by increased METs and functional capacity (6MWT) Short Term: Increase workloads from initial exercise prescription for resistance, speed, and METs.;Short Term: Perform resistance training exercises routinely during rehab and add in resistance training at home;Long Term: Improve cardiorespiratory fitness, muscular endurance and strength as measured by increased METs and functional capacity (6MWT)      Able to understand and use rate of perceived exertion (RPE) scale Yes Yes      Intervention Provide education and explanation on how to use RPE scale Provide education and explanation on how to use RPE scale      Expected Outcomes Short Term: Able to use RPE daily in rehab to express subjective intensity level;Long Term:  Able to use RPE to guide intensity level when exercising independently Short Term: Able to use RPE daily in rehab to express subjective intensity level;Long Term:  Able to use RPE to guide intensity level when exercising independently      Knowledge and understanding of Target Heart Rate Range (THRR) Yes Yes      Intervention Provide education and explanation of THRR including how the numbers were predicted and where they are located for reference Provide education and explanation of THRR including how the numbers were predicted and where they are located for reference      Expected Outcomes Short Term: Able to state/look up THRR;Short Term: Able to use daily as guideline for intensity in rehab;Long Term: Able to use THRR to govern intensity when exercising independently Short Term: Able  to state/look up THRR;Short Term: Able to use daily as guideline for intensity in rehab;Long Term: Able to use THRR to govern intensity when exercising independently      Able to check pulse independently Yes Yes      Intervention Provide education and demonstration on how to check pulse in carotid and radial arteries.;Review the importance of being able to check your own pulse for safety during independent exercise Provide education and demonstration on how to check pulse in carotid and radial arteries.;Review the importance of being able to check your own pulse for safety during independent exercise      Expected Outcomes Short Term: Able to explain why pulse checking is important during independent exercise;Long Term: Able to check pulse independently and accurately Short Term: Able to explain why pulse checking is important during independent exercise;Long Term: Able to check pulse independently and accurately      Understanding of Exercise Prescription Yes Yes      Intervention Provide education, explanation, and written materials on patient's individual exercise prescription Provide education, explanation, and written materials on patient's individual exercise prescription      Expected Outcomes Short Term: Able to explain program exercise prescription;Long Term: Able to explain home exercise prescription to exercise independently Short Term: Able to explain program exercise prescription;Long Term: Able to explain home exercise prescription to exercise independently               Exercise Goals Re-Evaluation :  Exercise Goals Re-Evaluation     Row Name 11/11/21 1321             Exercise Goal Re-Evaluation   Exercise Goals Review Increase Physical Activity;Increase Strength and Stamina;Able to understand and use rate of perceived exertion (RPE) scale;Knowledge and understanding of Target  Heart Rate Range (THRR);Able to check pulse independently;Understanding of Exercise Prescription        Comments Pt has completed 7 sessions of cardiac rehab. He is progressing during class by increasing his workload for both pieces of equipment. He is currently exercising at 2.83 METs on the stepper. Will continue to monitor and progress as able.       Expected Outcomes Through exercise at rehab and home, the patient will meet their stated goals.                 Discharge Exercise Prescription (Final Exercise Prescription Changes):  Exercise Prescription Changes - 11/11/21 1300       Response to Exercise   Blood Pressure (Admit) 102/52    Blood Pressure (Exercise) 110/60    Blood Pressure (Exit) 100/50    Heart Rate (Admit) 65 bpm    Heart Rate (Exercise) 92 bpm    Heart Rate (Exit) 74 bpm    Rating of Perceived Exertion (Exercise) 12    Duration Continue with 30 min of aerobic exercise without signs/symptoms of physical distress.    Intensity THRR unchanged      Progression   Progression Continue to progress workloads to maintain intensity without signs/symptoms of physical distress.      Resistance Training   Training Prescription Yes    Weight 2    Reps 10-15    Time 10 Minutes      NuStep   Level 2    SPM 123    Minutes 22    METs 2.83      Arm Ergometer   Level 2    RPM 56    Minutes 17    METs 2.06             Nutrition:  Target Goals: Understanding of nutrition guidelines, daily intake of sodium '1500mg'$ , cholesterol '200mg'$ , calories 30% from fat and 7% or less from saturated fats, daily to have 5 or more servings of fruits and vegetables.  Biometrics:  Pre Biometrics - 10/24/21 1009       Pre Biometrics   Height '5\' 9"'$  (1.753 m)    Weight 106 kg    Waist Circumference 50 inches    Hip Circumference 41 inches    Waist to Hip Ratio 1.22 %    BMI (Calculated) 34.49    Triceps Skinfold 10 mm    % Body Fat 33.3 %    Grip Strength 28.4 kg    Flexibility 0 in    Single Leg Stand 2 seconds              Nutrition Therapy Plan and Nutrition  Goals:  Nutrition Therapy & Goals - 10/28/21 1049       Personal Nutrition Goals   Comments Patient scored 45 on his diet assessment. We offer 2 educational sessions on heart hearthy nutrition with handouts and assistance with RD referral if patient is interested.      Intervention Plan   Intervention Nutrition handout(s) given to patient.    Expected Outcomes Short Term Goal: Understand basic principles of dietary content, such as calories, fat, sodium, cholesterol and nutrients.             Nutrition Assessments:  Nutrition Assessments - 10/24/21 0855       MEDFICTS Scores   Pre Score 45            MEDIFICTS Score Key: ?70 Need to make dietary changes  40-70 Heart Healthy Diet ?  40 Therapeutic Level Cholesterol Diet   Picture Your Plate Scores: <06 Unhealthy dietary pattern with much room for improvement. 41-50 Dietary pattern unlikely to meet recommendations for good health and room for improvement. 51-60 More healthful dietary pattern, with some room for improvement.  >60 Healthy dietary pattern, although there may be some specific behaviors that could be improved.    Nutrition Goals Re-Evaluation:   Nutrition Goals Discharge (Final Nutrition Goals Re-Evaluation):   Psychosocial: Target Goals: Acknowledge presence or absence of significant depression and/or stress, maximize coping skills, provide positive support system. Participant is able to verbalize types and ability to use techniques and skills needed for reducing stress and depression.  Initial Review & Psychosocial Screening:  Initial Psych Review & Screening - 10/24/21 0846       Initial Review   Current issues with None Identified      Family Dynamics   Good Support System? Yes    Comments His sons are his support system.      Barriers   Psychosocial barriers to participate in program There are no identifiable barriers or psychosocial needs.      Screening Interventions   Interventions  Encouraged to exercise    Expected Outcomes Long Term goal: The participant improves quality of Life and PHQ9 Scores as seen by post scores and/or verbalization of changes;Short Term goal: Identification and review with participant of any Quality of Life or Depression concerns found by scoring the questionnaire.             Quality of Life Scores:  Quality of Life - 10/24/21 1010       Quality of Life   Select Quality of Life      Quality of Life Scores   Health/Function Pre 20.04 %    Socioeconomic Pre 23.93 %    Psych/Spiritual Pre 18.93 %    Family Pre 22.5 %    GLOBAL Pre 21 %            Scores of 19 and below usually indicate a poorer quality of life in these areas.  A difference of  2-3 points is a clinically meaningful difference.  A difference of 2-3 points in the total score of the Quality of Life Index has been associated with significant improvement in overall quality of life, self-image, physical symptoms, and general health in studies assessing change in quality of life.  PHQ-9: Review Flowsheet       10/24/2021 09/27/2013  Depression screen PHQ 2/9  Decreased Interest 1 0  Down, Depressed, Hopeless 0 0  PHQ - 2 Score 1 0  Altered sleeping 0 -  Tired, decreased energy 2 -  Change in appetite 0 -  Feeling bad or failure about yourself  0 -  Trouble concentrating 0 -  Moving slowly or fidgety/restless 0 -  Suicidal thoughts 0 -  PHQ-9 Score 3 -  Difficult doing work/chores Somewhat difficult -   Interpretation of Total Score  Total Score Depression Severity:  1-4 = Minimal depression, 5-9 = Mild depression, 10-14 = Moderate depression, 15-19 = Moderately severe depression, 20-27 = Severe depression   Psychosocial Evaluation and Intervention:  Psychosocial Evaluation - 10/24/21 1004       Psychosocial Evaluation & Interventions   Interventions Encouraged to exercise with the program and follow exercise prescription    Comments Pt has no barriers to  participating in CR. He has no identifiable psychosocial issues. He scored a 3 on his PHQ-9, and he relates this to  his lack of energy since before his TAVR and his lack of pleasure in doing activities due to his fatigue. He reports that he has a good support system with his sons, and he also mentioned his grandsons and great grandsons. He reports that his goals while in the program are to lose weight and to improve his energy levels. He previously participated in CR about 8 years ago, and he is eager to begin the program again.    Expected Outcomes Pt will continue to have no identifiable psychosocial issues.    Continue Psychosocial Services  No Follow up required             Psychosocial Re-Evaluation:  Psychosocial Re-Evaluation     [place] Name 11/06/21 0806             Psychosocial Re-Evaluation   Current issues with None Identified       Comments Patient is new to the program completing 4 sessions. He continues to have no psychosocial barriers identified. He seems to enjoy the sessions and demonstrates an interest in improving his health. We will continue to monitor his progress.       Expected Outcomes Patient will continue to have no psychosocial barriers identified.       Interventions Stress management education;Encouraged to attend Cardiac Rehabilitation for the exercise;Relaxation education       Continue Psychosocial Services  No Follow up required                Psychosocial Discharge (Final Psychosocial Re-Evaluation):  Psychosocial Re-Evaluation - 11/06/21 0806       Psychosocial Re-Evaluation   Current issues with None Identified    Comments Patient is new to the program completing 4 sessions. He continues to have no psychosocial barriers identified. He seems to enjoy the sessions and demonstrates an interest in improving his health. We will continue to monitor his progress.    Expected Outcomes Patient will continue to have no psychosocial barriers identified.     Interventions Stress management education;Encouraged to attend Cardiac Rehabilitation for the exercise;Relaxation education    Continue Psychosocial Services  No Follow up required             Vocational Rehabilitation: Provide vocational rehab assistance to qualifying candidates.   Vocational Rehab Evaluation & Intervention:  Vocational Rehab - 10/24/21 0909       Initial Vocational Rehab Evaluation & Intervention   Assessment shows need for Vocational Rehabilitation No      Vocational Rehab Re-Evaulation   Comments retired             Education: Education Goals: Education classes will be provided on a weekly basis, covering required topics. Participant will state understanding/return demonstration of topics presented.  Learning Barriers/Preferences:  Learning Barriers/Preferences - 10/24/21 0901       Learning Barriers/Preferences   Learning Barriers None    Learning Preferences Skilled Demonstration;Individual Instruction             Education Topics: Hypertension, Hypertension Reduction -Define heart disease and high blood pressure. Discus how high blood pressure affects the body and ways to reduce high blood pressure.   Exercise and Your Heart -Discuss why it is important to exercise, the FITT principles of exercise, normal and abnormal responses to exercise, and how to exercise safely.   Angina -Discuss definition of angina, causes of angina, treatment of angina, and how to decrease risk of having angina.   Cardiac Medications -Review what the following cardiac medications are used  for, how they affect the body, and side effects that may occur when taking the medications.  Medications include Aspirin, Beta blockers, calcium channel blockers, ACE Inhibitors, angiotensin receptor blockers, diuretics, digoxin, and antihyperlipidemics. Flowsheet Row CARDIAC REHAB PHASE II EXERCISE from 11/06/2021 in [place]  Date 10/30/21   Educator DF  Instruction Review Code 2- Demonstrated Understanding       Congestive Heart Failure -Discuss the definition of CHF, how to live with CHF, the signs and symptoms of CHF, and how keep track of weight and sodium intake. Flowsheet Row CARDIAC REHAB PHASE II EXERCISE from 11/06/2021 in [place]  Date 11/06/21  Educator HB  Instruction Review Code 1- Verbalizes Understanding       Heart Disease and Intimacy -Discus the effect sexual activity has on the heart, how changes occur during intimacy as we age, and safety during sexual activity.   Smoking Cessation / COPD -Discuss different methods to quit smoking, the health benefits of quitting smoking, and the definition of COPD.   Nutrition I: Fats -Discuss the types of cholesterol, what cholesterol does to the heart, and how cholesterol levels can be controlled.   Nutrition II: Labels -Discuss the different components of food labels and how to read food label   Heart Parts/Heart Disease and PAD -Discuss the anatomy of the heart, the pathway of blood circulation through the heart, and these are affected by heart disease.   Stress I: Signs and Symptoms -Discuss the causes of stress, how stress may lead to anxiety and depression, and ways to limit stress.   Stress II: Relaxation -Discuss different types of relaxation techniques to limit stress.   Warning Signs of Stroke / TIA -Discuss definition of a stroke, what the signs and symptoms are of a stroke, and how to identify when someone is having stroke.   Knowledge Questionnaire Score:  Knowledge Questionnaire Score - 10/24/21 0902       Knowledge Questionnaire Score   Pre Score 22/24             Core Components/Risk Factors/Patient Goals at Admission:  Personal Goals and Risk Factors at Admission - 10/24/21 0909       Core Components/Risk Factors/Patient Goals on Admission    Weight Management Yes;Obesity;Weight Loss     Intervention Weight Management: Develop a combined nutrition and exercise program designed to reach desired caloric intake, while maintaining appropriate intake of nutrient and fiber, sodium and fats, and appropriate energy expenditure required for the weight goal.;Weight Management: Provide education and appropriate resources to help participant work on and attain dietary goals.;Weight Management/Obesity: Establish reasonable short term and long term weight goals.;Obesity: Provide education and appropriate resources to help participant work on and attain dietary goals.    Admit Weight 234 lb (106.1 kg)    Goal Weight: Short Term 220 lb (99.8 kg)    Expected Outcomes Short Term: Continue to assess and modify interventions until short term weight is achieved;Long Term: Adherence to nutrition and physical activity/exercise program aimed toward attainment of established weight goal;Weight Maintenance: Understanding of the daily nutrition guidelines, which includes 25-35% calories from fat, 7% or less cal from saturated fats, less than '200mg'$  cholesterol, less than 1.5gm of sodium, & 5 or more servings of fruits and vegetables daily;Weight Loss: Understanding of general recommendations for a balanced deficit meal plan, which promotes 1-2 lb weight loss per week and includes a negative energy balance of [phone] kcal/d;Understanding recommendations for meals to include 15-35% energy as protein,  25-35% energy from fat, 35-60% energy from carbohydrates, less than '200mg'$  of dietary cholesterol, 20-35 gm of total fiber daily;Understanding of distribution of calorie intake throughout the day with the consumption of 4-5 meals/snacks    Improve shortness of breath with ADL's Yes    Intervention Provide education, individualized exercise plan and daily activity instruction to help decrease symptoms of SOB with activities of daily living.    Expected Outcomes Short Term: Improve cardiorespiratory fitness to achieve a  reduction of symptoms when performing ADLs;Long Term: Be able to perform more ADLs without symptoms or delay the onset of symptoms    Diabetes Yes    Intervention Provide education about signs/symptoms and action to take for hypo/hyperglycemia.;Provide education about proper nutrition, including hydration, and aerobic/resistive exercise prescription along with prescribed medications to achieve blood glucose in normal ranges: Fasting glucose 65-99 mg/dL    Expected Outcomes Short Term: Participant verbalizes understanding of the signs/symptoms and immediate care of hyper/hypoglycemia, proper foot care and importance of medication, aerobic/resistive exercise and nutrition plan for blood glucose control.;Long Term: Attainment of HbA1C < 7%.    Hypertension Yes    Intervention Provide education on lifestyle modifcations including regular physical activity/exercise, weight management, moderate sodium restriction and increased consumption of fresh fruit, vegetables, and low fat dairy, alcohol moderation, and smoking cessation.;Monitor prescription use compliance.    Expected Outcomes Short Term: Continued assessment and intervention until BP is < 140/75m HG in hypertensive participants. < 130/826mHG in hypertensive participants with diabetes, heart failure or chronic kidney disease.;Long Term: Maintenance of blood pressure at goal levels.    Lipids Yes    Intervention Provide education and support for participant on nutrition & aerobic/resistive exercise along with prescribed medications to achieve LDL '70mg'$ , HDL >'40mg'$ .    Expected Outcomes Long Term: Cholesterol controlled with medications as prescribed, with individualized exercise RX and with personalized nutrition plan. Value goals: LDL < '70mg'$ , HDL > 40 mg.;Short Term: Participant states understanding of desired cholesterol values and is compliant with medications prescribed. Participant is following exercise prescription and nutrition guidelines.              Core Components/Risk Factors/Patient Goals Review:   Goals and Risk Factor Review     Row Name 11/06/21 0808             Core Components/Risk Factors/Patient Goals Review   Personal Goals Review Weight Management/Obesity;Improve shortness of breath with ADL's;Diabetes;Lipids;Hypertension;Other       Review Patient was referred to CR with [facility]. He has multiple risk factors for CAD and is participating in the program for risk modification. He has completed 4 sessions. His current weight is 232.6 down 1.4 lbs from his initial visit. His most recent A1C was 09/13/21 at 7.8%. He is on Metformin, Farxiga, Glipizide and Levemir for DM control. His blood pressure has at goal. We will continue to monitor. His personal goals for the program are to lose weight and improve his energy. We will continue to monitor his progress as he works towards meeting these goals.       Expected Outcomes Patient will complete the program meeting both personal and program goals.                Core Components/Risk Factors/Patient Goals at Discharge (Final Review):   Goals and Risk Factor Review - 11/06/21 0808       Core Components/Risk Factors/Patient Goals Review   Personal Goals Review Weight Management/Obesity;Improve shortness of breath with ADL's;Diabetes;Lipids;Hypertension;Other    Review  Patient was referred to CR with [facility]. He has multiple risk factors for CAD and is participating in the program for risk modification. He has completed 4 sessions. His current weight is 232.6 down 1.4 lbs from his initial visit. His most recent A1C was 09/13/21 at 7.8%. He is on Metformin, Farxiga, Glipizide and Levemir for DM control. His blood pressure has at goal. We will continue to monitor. His personal goals for the program are to lose weight and improve his energy. We will continue to monitor his progress as he works towards meeting these goals.    Expected Outcomes Patient will complete the program meeting both  personal and program goals.             ITP Comments:   Comments: ITP REVIEW Pt is making expected progress toward Cardiac Rehab goals after completing 7 sessions. Recommend continued exercise, life style modification, education, and increased stamina and strength.

## 2021-11-15 ENCOUNTER — Encounter (HOSPITAL_COMMUNITY)
Admission: RE | Admit: 2021-11-15 | Discharge: 2021-11-15 | Disposition: A | Discharge status: Home / Self Care | Payer: Medicare HMO | Source: Ambulatory Visit | Attending: Cardiovascular Disease | Admitting: Cardiovascular Disease

## 2021-11-15 DIAGNOSIS — Z952 Presence of prosthetic heart valve: Secondary | ICD-10-CM | POA: Diagnosis not present

## 2021-11-15 DIAGNOSIS — D509 Iron deficiency anemia, unspecified: Secondary | ICD-10-CM | POA: Diagnosis not present

## 2021-11-15 NOTE — Progress Notes (Signed)
Daily Session Note  Patient Details  Name: MANSON LUCKADOO MRN: [id number] Date of Birth: 01-14-1945 Referring Provider:   Flowsheet Row CARDIAC REHAB PHASE II ORIENTATION from 10/24/2021 in [place]  Referring Provider Dr. Angelena Form       Encounter Date: 11/15/2021  Check In:  Session Check In - 11/15/21 0815       Check-In   Supervising physician immediately available to respond to emergencies [facility] MD immediately available    Physician(s) Dr. Dellia Cloud    Location [facility]    Staff Present Hoy Register MHA, MS, ACSM-CEP;Timber Marshman Wynetta Emery, RN, Joanette Gula, RN, BSN    Virtual Visit No    Medication changes reported     No    Fall or balance concerns reported    Yes    Comments He has not fallen but he does lose his balance often. His arthritis in his knees also causes his knees to feel weak at times.    Tobacco Cessation No Change    Warm-up and Cool-down Performed as group-led instruction    Resistance Training Performed Yes    VAD Patient? No    PAD/SET Patient? No      Pain Assessment   Currently in Pain? No/denies    Pain Score 0-No pain    Multiple Pain Sites No             Capillary Blood Glucose: No results found for this or any previous visit (from the past 24 hour(s)).    Social History   Tobacco Use  Smoking Status Former   Packs/day: 3.00   Years: 45.00   Total pack years: 135.00   Types: Cigarettes   Start date: 01/06/1962   Quit date: 10/24/2004   Years since quitting: 17.0  Smokeless Tobacco Former   Quit date: 08/10/2005    Goals Met:  Independence with exercise equipment Exercise tolerated well No report of concerns or symptoms today Strength training completed today  Goals Unmet:  Not Applicable  Comments: Check out 915.   Dr. Carlyle Dolly is Medical Director for [facility]

## 2021-11-16 LAB — CBC WITH DIFFERENTIAL/PLATELET
Basophils Absolute: 0 10*3/uL (ref 0.0–0.2)
Basos: 0 %
EOS (ABSOLUTE): 0.3 10*3/uL (ref 0.0–0.4)
Eos: 3 %
Hematocrit: 41.8 % (ref 37.5–51.0)
Hemoglobin: 13.4 g/dL (ref 13.0–17.7)
Immature Grans (Abs): 0 10*3/uL (ref 0.0–0.1)
Immature Granulocytes: 0 %
Lymphocytes Absolute: 1.2 10*3/uL (ref 0.7–3.1)
Lymphs: 11 %
MCH: 29.8 pg (ref 26.6–33.0)
MCHC: 32.1 g/dL (ref 31.5–35.7)
MCV: 93 fL (ref 79–97)
Monocytes Absolute: 0.8 10*3/uL (ref 0.1–0.9)
Monocytes: 8 %
Neutrophils Absolute: 8.3 10*3/uL — ABNORMAL HIGH (ref 1.4–7.0)
Neutrophils: 78 %
Platelets: 182 10*3/uL (ref 150–450)
RBC: 4.5 x10E6/uL (ref 4.14–5.80)
RDW: 14.7 % (ref 11.6–15.4)
WBC: 10.7 10*3/uL (ref 3.4–10.8)

## 2021-11-16 LAB — IRON,TIBC AND FERRITIN PANEL
Ferritin: 41 ng/mL (ref 30–400)
Iron Saturation: 27 % (ref 15–55)
Iron: 95 ug/dL (ref 38–169)
Total Iron Binding Capacity: 350 ug/dL (ref 250–450)
UIBC: 255 ug/dL (ref 111–343)

## 2021-11-18 ENCOUNTER — Encounter (HOSPITAL_COMMUNITY)
Admission: RE | Admit: 2021-11-18 | Discharge: 2021-11-18 | Disposition: A | Discharge status: Home / Self Care | Payer: Medicare HMO | Source: Ambulatory Visit | Attending: Cardiovascular Disease | Admitting: Cardiovascular Disease

## 2021-11-18 DIAGNOSIS — Z952 Presence of prosthetic heart valve: Secondary | ICD-10-CM

## 2021-11-18 NOTE — Progress Notes (Signed)
Daily Session Note  Patient Details  Name: William [name] MRN: [id number] Date of Birth: 1945/03/05 Referring Provider:   Flowsheet Row CARDIAC REHAB PHASE II ORIENTATION from 10/24/2021 in [place]  Referring Provider Dr. Angelena Form       Encounter Date: 11/18/2021  Check In:  Session Check In - 11/18/21 0815       Check-In   Supervising physician immediately available to respond to emergencies [facility] MD immediately available    Physician(s) Dr. Harl Bowie    Location [facility]    Staff Present Leana Roe, BS, Exercise Physiologist;Daphyne Hassell Done, RN, BSN    Virtual Visit No    Medication changes reported     No    Fall or balance concerns reported    Yes    Comments He has not fallen but he does lose his balance often. His arthritis in his knees also causes his knees to feel weak at times.    Tobacco Cessation No Change    Warm-up and Cool-down Performed as group-led instruction    Resistance Training Performed Yes    VAD Patient? No    PAD/SET Patient? No      Pain Assessment   Currently in Pain? No/denies    Pain Score 0-No pain    Multiple Pain Sites No             Capillary Blood Glucose: No results found for this or any previous visit (from the past 24 hour(s)).    Social History   Tobacco Use  Smoking Status Former   Packs/day: 3.00   Years: 45.00   Total pack years: 135.00   Types: Cigarettes   Start date: 01/06/1962   Quit date: 10/24/2004   Years since quitting: 17.0  Smokeless Tobacco Former   Quit date: 08/10/2005    Goals Met:  Independence with exercise equipment Exercise tolerated well No report of concerns or symptoms today Strength training completed today  Goals Unmet:  Not Applicable  Comments: check out 0915   Dr. Carlyle Dolly is Medical Director for [facility]

## 2021-11-20 ENCOUNTER — Encounter (HOSPITAL_COMMUNITY)
Admission: RE | Admit: 2021-11-20 | Discharge: 2021-11-20 | Disposition: A | Discharge status: Home / Self Care | Payer: Medicare HMO | Source: Ambulatory Visit | Attending: Cardiovascular Disease | Admitting: Cardiovascular Disease

## 2021-11-20 DIAGNOSIS — Z952 Presence of prosthetic heart valve: Secondary | ICD-10-CM

## 2021-11-20 NOTE — Progress Notes (Signed)
Daily Session Note  Patient Details  Name: GWYN MEHRING MRN: [id number] Date of Birth: 01-Jan-1946 Referring Provider:   Flowsheet Row CARDIAC REHAB PHASE II ORIENTATION from 10/24/2021 in [place]  Referring Provider Dr. Angelena Form       Encounter Date: 11/20/2021  Check In:  Session Check In - 11/20/21 0815       Check-In   Supervising physician immediately available to respond to emergencies [facility] MD immediately available    Physician(s) Dr. Harl Bowie    Location [facility]    Staff Present Leana Roe, BS, Exercise Physiologist;Dalton Sherrie George, MS, ACSM-CEP    Virtual Visit No    Medication changes reported     No    Fall or balance concerns reported    Yes    Comments He has not fallen but he does lose his balance often. His arthritis in his knees also causes his knees to feel weak at times.    Tobacco Cessation No Change    Warm-up and Cool-down Performed as group-led instruction    Resistance Training Performed Yes    VAD Patient? No    PAD/SET Patient? No      Pain Assessment   Currently in Pain? No/denies    Pain Score 0-No pain    Multiple Pain Sites No             Capillary Blood Glucose: No results found for this or any previous visit (from the past 24 hour(s)).    Social History   Tobacco Use  Smoking Status Former   Packs/day: 3.00   Years: 45.00   Total pack years: 135.00   Types: Cigarettes   Start date: 01/06/1962   Quit date: 10/24/2004   Years since quitting: 17.0  Smokeless Tobacco Former   Quit date: 08/10/2005    Goals Met:  Independence with exercise equipment Exercise tolerated well No report of concerns or symptoms today Strength training completed today  Goals Unmet:  Not Applicable  Comments: check out 0915   Dr. Carlyle Dolly is Medical Director for [facility]

## 2021-11-22 ENCOUNTER — Encounter (HOSPITAL_COMMUNITY)
Admission: RE | Admit: 2021-11-22 | Discharge: 2021-11-22 | Disposition: A | Discharge status: Home / Self Care | Payer: Medicare HMO | Source: Ambulatory Visit | Attending: Cardiovascular Disease | Admitting: Cardiovascular Disease

## 2021-11-22 DIAGNOSIS — Z952 Presence of prosthetic heart valve: Secondary | ICD-10-CM

## 2021-11-22 NOTE — Progress Notes (Signed)
Daily Session Note  Patient Details  Name: William [name] MRN: [id number] Date of Birth: [birth year] Referring Provider:   Flowsheet Row CARDIAC REHAB PHASE II ORIENTATION from 10/24/2021 in [place]  Referring Provider Dr. Angelena Form       Encounter Date: 11/22/2021  Check In:  Session Check In - 11/22/21 0812       Check-In   Supervising physician immediately available to respond to emergencies [facility] MD immediately available    Physician(s) Dr. Harl Bowie    Location [facility]    Staff Present Leana Roe, BS, Exercise Physiologist;Leonor Darnell Wynetta Emery, RN, Joanette Gula, RN, BSN    Virtual Visit No    Medication changes reported     No    Fall or balance concerns reported    Yes    Comments He has not fallen but he does lose his balance often. His arthritis in his knees also causes his knees to feel weak at times.    Tobacco Cessation No Change    Warm-up and Cool-down Performed as group-led instruction    Resistance Training Performed Yes    VAD Patient? No    PAD/SET Patient? No      Pain Assessment   Currently in Pain? No/denies    Pain Score 0-No pain    Multiple Pain Sites No             Capillary Blood Glucose: No results found for this or any previous visit (from the past 24 hour(s)).    Social History   Tobacco Use  Smoking Status Former   Packs/day: 3.00   Years: 45.00   Total pack years: 135.00   Types: Cigarettes   Start date: 01/06/1962   Quit date: 10/24/2004   Years since quitting: 17.0  Smokeless Tobacco Former   Quit date: 08/10/2005    Goals Met:  Independence with exercise equipment Exercise tolerated well No report of concerns or symptoms today Strength training completed today  Goals Unmet:  Not Applicable  Comments: Check out 915.   Dr. Carlyle Dolly is Medical Director for [facility]

## 2021-11-25 ENCOUNTER — Encounter (HOSPITAL_COMMUNITY)
Admission: RE | Admit: 2021-11-25 | Discharge: 2021-11-25 | Disposition: A | Discharge status: Home / Self Care | Payer: Medicare HMO | Source: Ambulatory Visit | Attending: Cardiovascular Disease | Admitting: Cardiovascular Disease

## 2021-11-25 VITALS — Wt 228.0 lb

## 2021-11-25 DIAGNOSIS — Z952 Presence of prosthetic heart valve: Secondary | ICD-10-CM

## 2021-11-25 NOTE — Progress Notes (Signed)
Daily Session Note  Patient Details  Name: POPE BRUNTY MRN: [id number] Date of Birth: [birth year] Referring Provider:   Flowsheet Row CARDIAC REHAB PHASE II ORIENTATION from 10/24/2021 in [place]  Referring Provider Dr. Angelena Form       Encounter Date: 11/25/2021  Check In:  Session Check In - 11/25/21 0815       Check-In   Supervising physician immediately available to respond to emergencies [facility] immediately available    Physician(s) Dr. Domenic Polite    Location [facility]    Staff Present Leana Roe, BS, Exercise Physiologist;Daphyne Hassell Done, RN, BSN    Virtual Visit No    Medication changes reported     No    Fall or balance concerns reported    Yes    Comments He has not fallen but he does lose his balance often. His arthritis in his knees also causes his knees to feel weak at times.    Tobacco Cessation No Change    Warm-up and Cool-down Performed as group-led instruction    Resistance Training Performed Yes    VAD Patient? No    PAD/SET Patient? No      Pain Assessment   Currently in Pain? No/denies    Pain Score 0-No pain    Multiple Pain Sites No             Capillary Blood Glucose: No results found for this or any previous visit (from the past 24 hour(s)).    Social History   Tobacco Use  Smoking Status Former   Packs/day: 3.00   Years: 45.00   Total pack years: 135.00   Types: Cigarettes   Start date: 01/06/1962   Quit date: 10/24/2004   Years since quitting: 17.0  Smokeless Tobacco Former   Quit date: 08/10/2005    Goals Met:  Independence with exercise equipment Exercise tolerated well No report of concerns or symptoms today Strength training completed today  Goals Unmet:  Not Applicable  Comments: check out 0915   Dr. Carlyle Dolly is Medical Director for [facility]

## 2021-11-27 ENCOUNTER — Encounter (HOSPITAL_COMMUNITY)
Admission: RE | Admit: 2021-11-27 | Discharge: 2021-11-27 | Disposition: A | Discharge status: Home / Self Care | Payer: Medicare HMO | Source: Ambulatory Visit | Attending: Cardiovascular Disease | Admitting: Cardiovascular Disease

## 2021-11-27 DIAGNOSIS — Z6834 Body mass index (BMI) 34.0-34.9, adult: Secondary | ICD-10-CM | POA: Diagnosis not present

## 2021-11-27 DIAGNOSIS — M150 Primary generalized (osteo)arthritis: Secondary | ICD-10-CM | POA: Diagnosis not present

## 2021-11-27 DIAGNOSIS — E1022 Type 1 diabetes mellitus with diabetic chronic kidney disease: Secondary | ICD-10-CM | POA: Diagnosis not present

## 2021-11-27 DIAGNOSIS — Z952 Presence of prosthetic heart valve: Secondary | ICD-10-CM

## 2021-11-27 DIAGNOSIS — N183 Chronic kidney disease, stage 3 unspecified: Secondary | ICD-10-CM | POA: Diagnosis not present

## 2021-11-27 DIAGNOSIS — I480 Paroxysmal atrial fibrillation: Secondary | ICD-10-CM | POA: Diagnosis not present

## 2021-11-27 DIAGNOSIS — M1991 Primary osteoarthritis, unspecified site: Secondary | ICD-10-CM | POA: Diagnosis not present

## 2021-11-27 NOTE — Progress Notes (Signed)
Daily Session Note  Patient Details  Name: William [name] MRN: [id number] Date of Birth: 12/13/1945 Referring Provider:   Flowsheet Row CARDIAC REHAB PHASE II ORIENTATION from 10/24/2021 in [facility]  Referring Provider Dr. Angelena Form       Encounter Date: 11/27/2021  Check In:  Session Check In - 11/27/21 0815       Check-In   Supervising physician immediately available to respond to emergencies [facility] MD immediately available    Physician(s) Dr. Domenic Polite    Location [facility]    Staff Present Leana Roe, BS, Exercise Physiologist;Dalton Sherrie George, MS, ACSM-CEP    Virtual Visit No    Medication changes reported     No    Fall or balance concerns reported    Yes    Comments He has not fallen but he does lose his balance often. His arthritis in his knees also causes his knees to feel weak at times.    Tobacco Cessation No Change    Warm-up and Cool-down Performed as group-led instruction    Resistance Training Performed Yes    VAD Patient? No    PAD/SET Patient? No      Pain Assessment   Currently in Pain? No/denies    Pain Score 0-No pain    Multiple Pain Sites No             Capillary Blood Glucose: No results found for this or any previous visit (from the past 24 hour(s)).    Social History   Tobacco Use  Smoking Status Former   Packs/day: 3.00   Years: 45.00   Total pack years: 135.00   Types: Cigarettes   Start date: 01/06/1962   Quit date: 10/24/2004   Years since quitting: 17.1  Smokeless Tobacco Former   Quit date: 08/10/2005    Goals Met:  Independence with exercise equipment Exercise tolerated well No report of concerns or symptoms today Strength training completed today  Goals Unmet:  Not Applicable  Comments: check out 0915   Dr. Carlyle Dolly is Medical Director for [facility]

## 2021-11-29 ENCOUNTER — Encounter (HOSPITAL_COMMUNITY): Payer: Medicare HMO

## 2021-12-02 ENCOUNTER — Encounter (HOSPITAL_COMMUNITY)
Admission: RE | Admit: 2021-12-02 | Discharge: 2021-12-02 | Disposition: A | Discharge status: Home / Self Care | Payer: Medicare HMO | Source: Ambulatory Visit | Attending: Cardiovascular Disease | Admitting: Cardiovascular Disease

## 2021-12-02 DIAGNOSIS — Z952 Presence of prosthetic heart valve: Secondary | ICD-10-CM

## 2021-12-02 NOTE — Progress Notes (Signed)
Daily Session Note  Patient Details  Name: JVION TURGEON MRN: [id number] Date of Birth: [birth year] Referring Provider:   Flowsheet Row CARDIAC REHAB PHASE II ORIENTATION from 10/24/2021 in [place]  Referring Provider Dr. Angelena Form       Encounter Date: 12/02/2021  Check In:  Session Check In - 12/02/21 0815       Check-In   Supervising physician immediately available to respond to emergencies [facility] MD immediately available    Physician(s) Dr.Mallipeddi    Location [facility]    Staff Present Leana Roe, BS, Exercise Physiologist;Dalton Sherrie George, MS, ACSM-CEP;Madelyn Flavors, RN, BSN    Virtual Visit No    Fall or balance concerns reported    Yes    Comments He has not fallen but he does lose his balance often. His arthritis in his knees also causes his knees to feel weak at times.    Tobacco Cessation No Change    Warm-up and Cool-down Performed as group-led instruction    Resistance Training Performed Yes    VAD Patient? No    PAD/SET Patient? No      Pain Assessment   Currently in Pain? No/denies    Pain Score 0-No pain    Multiple Pain Sites No             Capillary Blood Glucose: No results found for this or any previous visit (from the past 24 hour(s)).    Social History   Tobacco Use  Smoking Status Former   Packs/day: 3.00   Years: 45.00   Total pack years: 135.00   Types: Cigarettes   Start date: 01/06/1962   Quit date: 10/24/2004   Years since quitting: 17.1  Smokeless Tobacco Former   Quit date: 08/10/2005    Goals Met:  Independence with exercise equipment Exercise tolerated well No report of concerns or symptoms today Strength training completed today  Goals Unmet:  Not Applicable  Comments: checkout 0915   Dr. Carlyle Dolly is Medical Director for [facility]

## 2021-12-04 ENCOUNTER — Encounter (HOSPITAL_COMMUNITY)
Admission: RE | Admit: 2021-12-04 | Discharge: 2021-12-04 | Disposition: A | Discharge status: Home / Self Care | Payer: Medicare HMO | Source: Ambulatory Visit | Attending: Cardiovascular Disease | Admitting: Cardiovascular Disease

## 2021-12-04 DIAGNOSIS — Z952 Presence of prosthetic heart valve: Secondary | ICD-10-CM

## 2021-12-04 NOTE — Progress Notes (Signed)
Daily Session Note  Patient Details  Name: William [name] MRN: [id number] Date of Birth: 03-07-1945 Referring Provider:   Flowsheet Row CARDIAC REHAB PHASE II ORIENTATION from 10/24/2021 in [place]  Referring Provider Dr. Angelena Form       Encounter Date: 12/04/2021  Check In:  Session Check In - 12/04/21 0813       Check-In   Supervising physician immediately available to respond to emergencies [facility] MD immediately available    Physician(s) Dr.Mallipeddi    Location [facility]    Staff Present Leana Roe, BS, Exercise Physiologist;Dalton Sherrie George, MS, ACSM-CEP;Melven Sartorius BSN, RN    Virtual Visit No    Medication changes reported     No    Fall or balance concerns reported    Yes    Comments He has not fallen but he does lose his balance often. His arthritis in his knees also causes his knees to feel weak at times.    Tobacco Cessation No Change    Warm-up and Cool-down Performed as group-led instruction    Resistance Training Performed Yes    VAD Patient? No    PAD/SET Patient? No      Pain Assessment   Currently in Pain? No/denies    Pain Score 0-No pain    Multiple Pain Sites No             Capillary Blood Glucose: No results found for this or any previous visit (from the past 24 hour(s)).    Social History   Tobacco Use  Smoking Status Former   Packs/day: 3.00   Years: 45.00   Total pack years: 135.00   Types: Cigarettes   Start date: 01/06/1962   Quit date: 10/24/2004   Years since quitting: 17.1  Smokeless Tobacco Former   Quit date: 08/10/2005    Goals Met:  Independence with exercise equipment Exercise tolerated well No report of concerns or symptoms today Strength training completed today  Goals Unmet:  Not Applicable  Comments: check out at 9:15   Dr. Carlyle Dolly is Medical Director for [facility]

## 2021-12-06 ENCOUNTER — Encounter (HOSPITAL_COMMUNITY)
Admission: RE | Admit: 2021-12-06 | Discharge: 2021-12-06 | Disposition: A | Discharge status: Home / Self Care | Payer: Medicare HMO | Source: Ambulatory Visit | Attending: Cardiovascular Disease | Admitting: Cardiovascular Disease

## 2021-12-06 DIAGNOSIS — Z952 Presence of prosthetic heart valve: Secondary | ICD-10-CM | POA: Insufficient documentation

## 2021-12-06 NOTE — Progress Notes (Signed)
Daily Session Note  Patient Details  Name: William [name] MRN: [id number] Date of Birth: 1945-10-31 Referring Provider:   Flowsheet Row CARDIAC REHAB PHASE II ORIENTATION from 10/24/2021 in [place]  Referring Provider Dr. Angelena Form       Encounter Date: 12/06/2021  Check In:  Session Check In - 12/06/21 0815       Check-In   Supervising physician immediately available to respond to emergencies [facility] MD immediately available    Physician(s) Dr.[email or web address]    Location [facility]    Staff Present Leana Roe, BS, Exercise Physiologist;Dalton Sherrie George, MS, ACSM-CEP;Madelyn Flavors, RN, BSN    Virtual Visit No    Medication changes reported     No    Fall or balance concerns reported    Yes    Comments He has not fallen but he does lose his balance often. His arthritis in his knees also causes his knees to feel weak at times.    Tobacco Cessation No Change    Warm-up and Cool-down Performed as group-led instruction    Resistance Training Performed Yes    VAD Patient? No    PAD/SET Patient? No      Pain Assessment   Currently in Pain? No/denies    Pain Score 0-No pain             Capillary Blood Glucose: No results found for this or any previous visit (from the past 24 hour(s)).    Social History   Tobacco Use  Smoking Status Former   Packs/day: 3.00   Years: 45.00   Total pack years: 135.00   Types: Cigarettes   Start date: 01/06/1962   Quit date: 10/24/2004   Years since quitting: 17.1  Smokeless Tobacco Former   Quit date: 08/10/2005    Goals Met:  Independence with exercise equipment Exercise tolerated well No report of concerns or symptoms today Strength training completed today  Goals Unmet:  Not Applicable  Comments: check out 0915   Dr. Carlyle Dolly is Medical Director for [place]

## 2021-12-09 ENCOUNTER — Encounter (HOSPITAL_COMMUNITY)
Admission: RE | Admit: 2021-12-09 | Discharge: 2021-12-09 | Disposition: A | Discharge status: Home / Self Care | Payer: Medicare HMO | Source: Ambulatory Visit | Attending: Cardiovascular Disease | Admitting: Cardiovascular Disease

## 2021-12-09 VITALS — Wt 226.2 lb

## 2021-12-09 DIAGNOSIS — Z952 Presence of prosthetic heart valve: Secondary | ICD-10-CM

## 2021-12-09 NOTE — Progress Notes (Signed)
Daily Session Note  Patient Details  Name: William [name] MRN: [id number] Date of Birth: 1945-10-09 Referring Provider:   Flowsheet Row CARDIAC REHAB PHASE II ORIENTATION from 10/24/2021 in [place]  Referring Provider Dr. Angelena Form       Encounter Date: 12/09/2021  Check In:  Session Check In - 12/09/21 0815       Check-In   Supervising physician immediately available to respond to emergencies [facility] MD immediately available    Physician(s) Dr. Harl Bowie    Location [facility]    Staff Present Leana Roe, BS, Exercise Physiologist;Dalton Sherrie George, MS, ACSM-CEP;Madelyn Flavors, RN, BSN    Virtual Visit No    Medication changes reported     No    Fall or balance concerns reported    Yes    Comments He has not fallen but he does lose his balance often. His arthritis in his knees also causes his knees to feel weak at times.    Tobacco Cessation No Change    Warm-up and Cool-down Performed as group-led instruction    Resistance Training Performed Yes    VAD Patient? No    PAD/SET Patient? No      Pain Assessment   Currently in Pain? No/denies    Pain Score 0-No pain    Multiple Pain Sites No             Capillary Blood Glucose: No results found for this or any previous visit (from the past 24 hour(s)).    Social History   Tobacco Use  Smoking Status Former   Packs/day: 3.00   Years: 45.00   Total pack years: 135.00   Types: Cigarettes   Start date: 01/06/1962   Quit date: 10/24/2004   Years since quitting: 17.1  Smokeless Tobacco Former   Quit date: 08/10/2005    Goals Met:  Independence with exercise equipment Exercise tolerated well No report of concerns or symptoms today Strength training completed today  Goals Unmet:  Not Applicable  Comments: check out 0915   Dr. Carlyle Dolly is Medical Director for [place]

## 2021-12-11 ENCOUNTER — Encounter (HOSPITAL_COMMUNITY)
Admission: RE | Admit: 2021-12-11 | Discharge: 2021-12-11 | Disposition: A | Discharge status: Home / Self Care | Payer: Medicare HMO | Source: Ambulatory Visit | Attending: Cardiovascular Disease | Admitting: Cardiovascular Disease

## 2021-12-11 DIAGNOSIS — Z952 Presence of prosthetic heart valve: Secondary | ICD-10-CM

## 2021-12-11 NOTE — Progress Notes (Signed)
Daily Session Note  Patient Details  Name: William [name] MRN: [id number] Date of Birth: [birth year] Referring Provider:   Flowsheet Row CARDIAC REHAB PHASE II ORIENTATION from 10/24/2021 in [place]  Referring Provider Dr. Angelena Form       Encounter Date: 12/11/2021  Check In:  Session Check In - 12/11/21 0815       Check-In   Supervising physician immediately available to respond to emergencies [facility] MD immediately available    Physician(s) Dr. Dellia Cloud    Location [facility]    Staff Present Leana Roe, BS, Exercise Physiologist;Tyyonna Soucy BSN, RN;Debra Wynetta Emery, RN, BSN    Virtual Visit No    Medication changes reported     No    Fall or balance concerns reported    Yes    Comments He has not fallen but he does lose his balance often. His arthritis in his knees also causes his knees to feel weak at times.    Tobacco Cessation No Change    Warm-up and Cool-down Performed as group-led instruction    Resistance Training Performed Yes    VAD Patient? No    PAD/SET Patient? No      Pain Assessment   Currently in Pain? No/denies    Multiple Pain Sites No             Capillary Blood Glucose: No results found for this or any previous visit (from the past 24 hour(s)).    Social History   Tobacco Use  Smoking Status Former   Packs/day: 3.00   Years: 45.00   Total pack years: 135.00   Types: Cigarettes   Start date: 01/06/1962   Quit date: 10/24/2004   Years since quitting: 17.1  Smokeless Tobacco Former   Quit date: 08/10/2005    Goals Met:  Independence with exercise equipment Exercise tolerated well No report of concerns or symptoms today Strength training completed today  Goals Unmet:  Not Applicable  Comments: check out at 9:15   Dr. Carlyle Dolly is Medical Director for [facility]

## 2021-12-11 NOTE — Progress Notes (Signed)
Cardiac Individual Treatment Plan  Patient Details  Name: William [name] MRN: [id number] Date of Birth: 09-28-1945 Referring Provider:   Flowsheet Row CARDIAC REHAB PHASE II ORIENTATION from 10/24/2021 in [place]  Referring Provider Dr. Angelena Form       Initial Encounter Date:  Flowsheet Row CARDIAC REHAB PHASE II ORIENTATION from 10/24/2021 in [place]  Date 10/24/21       Visit Diagnosis: S/P TAVR (transcatheter aortic valve replacement)  Patient's Home Medications on Admission:  Current Outpatient Medications:    acarbose (PRECOSE) 100 MG tablet, Take 100 mg by mouth 3 (three) times daily with meals. , Disp: , Rfl:    ACCU-CHEK GUIDE test strip, , Disp: , Rfl:    Accu-Chek Softclix Lancets lancets, 1 each 3 (three) times daily., Disp: , Rfl:    Acetylcysteine (NAC 600) 600 MG CAPS, Take 1,200 mg by mouth in the morning., Disp: , Rfl:    alendronate (FOSAMAX) 70 MG tablet, Take 1 tablet (70 mg total) by mouth every 7 (seven) days. Take with a full glass of water on an empty stomach. (Patient taking differently: Take 70 mg by mouth every 7 (seven) days. Take with a full glass of water on an empty stomach. Taken [employer]), Disp: 4 tablet, Rfl: 11   amLODipine (NORVASC) 5 MG tablet, TAKE 1 TABLET(5 MG) BY MOUTH DAILY (Patient taking differently: Take 5 mg by mouth every evening.), Disp: 90 tablet, Rfl: 0   apixaban (ELIQUIS) 5 MG TABS tablet, Take 1 tablet (5 mg total) by mouth 2 (two) times daily., Disp: 60 tablet, Rfl: 2   Ascorbic Acid (VITAMIN C) 1000 MG tablet, Take 1,000 mg by mouth every morning., Disp: , Rfl:    aspirin EC 81 MG tablet, Take 81 mg by mouth in the morning. Swallow whole., Disp: , Rfl:    Cholecalciferol (VITAMIN D3) 125 MCG (5000 UT) TABS, Take 5,000 Units by mouth in the morning., Disp: , Rfl:    clopidogrel (PLAVIX) 75 MG tablet, Take 1 tablet (75 mg total) by mouth daily., Disp: 90 tablet, Rfl: 3    Coenzyme Q10 (COQ10) 100 MG CAPS, Take 100 mg by mouth in the morning., Disp: , Rfl:    FARXIGA 10 MG TABS tablet, Take 10 mg by mouth in the morning., Disp: , Rfl:    ferrous sulfate 325 (65 FE) MG EC tablet, Take 1 tablet (325 mg total) by mouth daily with breakfast., Disp: 30 tablet, Rfl: 3   furosemide (LASIX) 20 MG tablet, Take 20 mg by mouth 2 (two) times daily. , Disp: , Rfl: 0   glipiZIDE (GLUCOTROL) 10 MG tablet, Take 10 mg by mouth 2 (two) times daily., Disp: , Rfl:    Glucosamine-Chondroitin (MOVE FREE PO), Take 1 tablet by mouth in the morning and at bedtime., Disp: , Rfl:    HYDROcodone-acetaminophen (NORCO) 10-325 MG tablet, Take 1 tablet by mouth every 6 (six) hours as needed for moderate pain., Disp: , Rfl:    LEVEMIR FLEXTOUCH 100 UNIT/ML Pen, Inject 90 Units into the skin at bedtime. , Disp: , Rfl:    lisinopril (ZESTRIL) 20 MG tablet, Take 1 tablet (20 mg total) by mouth daily. (Patient taking differently: Take 20 mg by mouth every evening.), Disp: 90 tablet, Rfl: 1   Magnesium 400 MG TABS, Take 400 mg by mouth in the morning., Disp: , Rfl:    Menatetrenone (VITAMIN K2) 100 MCG TABS, Take 100 mcg by mouth in the morning., Disp: ,  Rfl:    metFORMIN (GLUCOPHAGE) 1000 MG tablet, Take 1,000 mg by mouth 2 (two) times daily., Disp: , Rfl: 3   metFORMIN (GLUCOPHAGE-XR) 500 MG 24 hr tablet, Take 500 mg by mouth every evening. Take with 1000 Metformin, Disp: , Rfl:    metoprolol tartrate (LOPRESSOR) 25 MG tablet, Take 1 tablet (25 mg total) by mouth 2 (two) times daily., Disp: 60 tablet, Rfl: 2   nitroGLYCERIN (NITROSTAT) 0.4 MG SL tablet, Place 1 tablet (0.4 mg total) under the tongue every 5 (five) minutes x 3 doses as needed for chest pain (if no relief after 2nd dose, proceed to the ED for an evalution or call 911)., Disp: 75 tablet, Rfl: 2   pantoprazole (PROTONIX) 40 MG tablet, Take 40 mg by mouth every evening. , Disp: , Rfl:    potassium citrate (UROCIT-K) 5 MEQ (540 MG) SR tablet,  Take 5 mEq by mouth 3 (three) times daily with meals., Disp: , Rfl:    QUERCETIN PO, Take 1,000 mg by mouth in the morning., Disp: , Rfl:    rosuvastatin (CRESTOR) 20 MG tablet, Take 20 mg by mouth every evening. , Disp: , Rfl:    silodosin (RAPAFLO) 8 MG CAPS capsule, Take 1 capsule (8 mg total) by mouth daily with breakfast., Disp: 90 capsule, Rfl: 3   trolamine salicylate (BLUE-EMU HEMP) 10 % cream, Apply 1 application topically as needed for muscle pain., Disp: , Rfl:    vitamin B-12 (CYANOCOBALAMIN) 1000 MCG tablet, Take 1,000 mcg by mouth in the morning., Disp: , Rfl:    zinc gluconate 50 MG tablet, Take 50 mg by mouth in the morning., Disp: , Rfl:   Past Medical History: Past Medical History:  Diagnosis Date   Anxiety    Aortic stenosis    Arthritis    CAD (coronary artery disease)    a. s/p DES to distal RCA in 08/2013, DES to LAD 07/2021   Cancer [facility])    prostate   CKD (chronic kidney disease)    Diabetes mellitus without complication (HCC)    Family history of colon cancer    GERD (gastroesophageal reflux disease)    Heart murmur    Hypercalcemia    Hypercholesteremia    Hypertension    Kidney stone    PAF (paroxysmal atrial fibrillation) (HCC)    Personal history of colonic polyps    Pneumonia    S/P TAVR (transcatheter aortic valve replacement) 09/17/2021   s/p TAVR with a 23 mm Edwards S3UR via the TF approach by Dr. Angelena Form & Bartle    Tobacco Use: Social History   Tobacco Use  Smoking Status Former   Packs/day: 3.00   Years: 45.00   Total pack years: 135.00   Types: Cigarettes   Start date: 01/06/1962   Quit date: 10/24/2004   Years since quitting: 17.1  Smokeless Tobacco Former   Quit date: 08/10/2005    Labs: Review Flowsheet  More data exists      Latest Ref Rng & Units 08/06/2009 10/21/2014 07/15/2021 09/13/2021 09/17/2021  Labs for ITP Cardiac and Pulmonary Rehab  Hemoglobin A1c 4.8 - 5.6 % - - - 7.0  -  PH, Arterial 7.35 - 7.45 - - 7.367  7.372  -  -  PCO2 arterial 32 - 48 mmHg - - 36.8  35.9  - -  Bicarbonate 20.0 - 28.0 mmol/L - - 24.2  21.1  20.9  - -  TCO2 22 - 32 mmol/L 26  25  26  22  22  - 23   Acid-base deficit 0.0 - 2.0 mmol/L - - 2.0  4.0  4.0  - -  O2 Saturation % - - 60  97  98  - -    Capillary Blood Glucose: Lab Results  Component Value Date   GLUCAP 235 (H) 09/23/2021   GLUCAP 187 (H) 09/23/2021   GLUCAP 180 (H) 09/22/2021   GLUCAP 174 (H) 09/22/2021   GLUCAP 170 (H) 09/22/2021    POCT Glucose     Row Name 10/24/21 0912             POCT Blood Glucose   Pre-Exercise 116 mg/dL                Exercise Target Goals: Exercise Program Goal: Individual exercise prescription set using results from initial 6 min walk test and THRR while considering  patient's activity barriers and safety.   Exercise Prescription Goal: Starting with aerobic activity 30 plus minutes a day, 3 days per week for initial exercise prescription. Provide home exercise prescription and guidelines that participant acknowledges understanding prior to discharge.  Activity Barriers & Risk Stratification:  Activity Barriers & Cardiac Risk Stratification - 10/24/21 0839       Activity Barriers & Cardiac Risk Stratification   Activity Barriers Arthritis;Back Problems;Deconditioning;Shortness of Breath;Chest Pain/Angina;Balance Concerns    Cardiac Risk Stratification High             6 Minute Walk:  6 Minute Walk     Row Name 10/24/21 1003         6 Minute Walk   Phase Initial     Distance 1100 feet     Walk Time 6 minutes     # of Rest Breaks 0     MPH 2.03     METS 1.75     RPE 13     VO2 Peak 6.15     Symptoms No     Resting HR 66 bpm     Resting BP 116/54     Resting Oxygen Saturation  97 %     Exercise Oxygen Saturation  during 6 min walk 98 %     Max Ex. HR 84 bpm     Max Ex. BP 136/54     2 Minute Post BP 120/54              Oxygen Initial Assessment:   Oxygen Re-Evaluation:   Oxygen  Discharge (Final Oxygen Re-Evaluation):   Initial Exercise Prescription:  Initial Exercise Prescription - 10/24/21 1000       Date of Initial Exercise RX and Referring Provider   Date 10/24/21    Referring Provider Dr. Angelena Form    Expected Discharge Date 01/17/22      NuStep   Level 1    SPM 60    Minutes 17      Arm Ergometer   Level 1    RPM 45    Minutes 22      Prescription Details   Frequency (times per week) 3    Duration Progress to 30 minutes of continuous aerobic without signs/symptoms of physical distress      Intensity   THRR 40-80% of Max Heartrate 58-116    Ratings of Perceived Exertion 11-13    Perceived Dyspnea 0-4      Resistance Training   Training Prescription Yes    Weight 3    Reps 10-15  Perform Capillary Blood Glucose checks as needed.  Exercise Prescription Changes:   Exercise Prescription Changes     Row Name 10/28/21 1200 11/11/21 1300 11/25/21 1000 12/09/21 1300       Response to Exercise   Blood Pressure (Admit) 120/62 102/52 120/58 132/62    Blood Pressure (Exercise) 144/56 110/60 122/58 116/60    Blood Pressure (Exit) 126/62 100/50 110/60 112/52    Heart Rate (Admit) 67 bpm 65 bpm 64 bpm 61 bpm    Heart Rate (Exercise) 93 bpm 92 bpm 85 bpm 97 bpm    Heart Rate (Exit) 76 bpm 74 bpm 73 bpm 71 bpm    Rating of Perceived Exertion (Exercise) '13 12 13 12    '$ Duration Continue with 30 min of aerobic exercise without signs/symptoms of physical distress. Continue with 30 min of aerobic exercise without signs/symptoms of physical distress. Continue with 30 min of aerobic exercise without signs/symptoms of physical distress. Continue with 30 min of aerobic exercise without signs/symptoms of physical distress.    Intensity THRR unchanged THRR unchanged THRR unchanged THRR unchanged      Progression   Progression Continue to progress workloads to maintain intensity without signs/symptoms of physical distress. Continue to  progress workloads to maintain intensity without signs/symptoms of physical distress. Continue to progress workloads to maintain intensity without signs/symptoms of physical distress. Continue to progress workloads to maintain intensity without signs/symptoms of physical distress.      Resistance Training   Training Prescription Yes Yes Yes Yes    Weight '2 2 5 5    '$ Reps 10-15 10-15 10-15 10-15    Time 10 Minutes 10 Minutes 10 Minutes 10 Minutes      NuStep   Level '1 2 3 3    '$ SPM 66 123 98 105    Minutes '17 22 22 22    '$ METs 1.75 2.83 2.77 2.36      Arm Ergometer   Level '1 2 2 3    '$ RPM 120 56 54 52    Minutes '22 17 17 17    '$ METs 2.98 2.06 2.01 2.17             Exercise Comments:   Exercise Goals and Review:   Exercise Goals     Row Name 10/24/21 1008 11/11/21 1321 12/09/21 1307         Exercise Goals   Increase Physical Activity Yes Yes Yes     Intervention Provide advice, education, support and counseling about physical activity/exercise needs.;Develop an individualized exercise prescription for aerobic and resistive training based on initial evaluation findings, risk stratification, comorbidities and participant's personal goals. Provide advice, education, support and counseling about physical activity/exercise needs.;Develop an individualized exercise prescription for aerobic and resistive training based on initial evaluation findings, risk stratification, comorbidities and participant's personal goals. Provide advice, education, support and counseling about physical activity/exercise needs.;Develop an individualized exercise prescription for aerobic and resistive training based on initial evaluation findings, risk stratification, comorbidities and participant's personal goals.     Expected Outcomes Short Term: Attend rehab on a regular basis to increase amount of physical activity.;Long Term: Add in home exercise to make exercise part of routine and to increase amount of  physical activity.;Long Term: Exercising regularly at least 3-5 days a week. Short Term: Attend rehab on a regular basis to increase amount of physical activity.;Long Term: Add in home exercise to make exercise part of routine and to increase amount of physical activity.;Long Term: Exercising regularly at least 3-5  days a week. Short Term: Attend rehab on a regular basis to increase amount of physical activity.;Long Term: Add in home exercise to make exercise part of routine and to increase amount of physical activity.;Long Term: Exercising regularly at least 3-5 days a week.     Increase Strength and Stamina Yes Yes Yes     Intervention Provide advice, education, support and counseling about physical activity/exercise needs.;Develop an individualized exercise prescription for aerobic and resistive training based on initial evaluation findings, risk stratification, comorbidities and participant's personal goals. Provide advice, education, support and counseling about physical activity/exercise needs.;Develop an individualized exercise prescription for aerobic and resistive training based on initial evaluation findings, risk stratification, comorbidities and participant's personal goals. Provide advice, education, support and counseling about physical activity/exercise needs.;Develop an individualized exercise prescription for aerobic and resistive training based on initial evaluation findings, risk stratification, comorbidities and participant's personal goals.     Expected Outcomes Short Term: Increase workloads from initial exercise prescription for resistance, speed, and METs.;Short Term: Perform resistance training exercises routinely during rehab and add in resistance training at home;Long Term: Improve cardiorespiratory fitness, muscular endurance and strength as measured by increased METs and functional capacity (6MWT) Short Term: Increase workloads from initial exercise prescription for resistance, speed,  and METs.;Short Term: Perform resistance training exercises routinely during rehab and add in resistance training at home;Long Term: Improve cardiorespiratory fitness, muscular endurance and strength as measured by increased METs and functional capacity (6MWT) Short Term: Increase workloads from initial exercise prescription for resistance, speed, and METs.;Short Term: Perform resistance training exercises routinely during rehab and add in resistance training at home;Long Term: Improve cardiorespiratory fitness, muscular endurance and strength as measured by increased METs and functional capacity (6MWT)     Able to understand and use rate of perceived exertion (RPE) scale Yes Yes Yes     Intervention Provide education and explanation on how to use RPE scale Provide education and explanation on how to use RPE scale Provide education and explanation on how to use RPE scale     Expected Outcomes Short Term: Able to use RPE daily in rehab to express subjective intensity level;Long Term:  Able to use RPE to guide intensity level when exercising independently Short Term: Able to use RPE daily in rehab to express subjective intensity level;Long Term:  Able to use RPE to guide intensity level when exercising independently Short Term: Able to use RPE daily in rehab to express subjective intensity level;Long Term:  Able to use RPE to guide intensity level when exercising independently     Knowledge and understanding of Target Heart Rate Range (THRR) Yes Yes Yes     Intervention Provide education and explanation of THRR including how the numbers were predicted and where they are located for reference Provide education and explanation of THRR including how the numbers were predicted and where they are located for reference Provide education and explanation of THRR including how the numbers were predicted and where they are located for reference     Expected Outcomes Short Term: Able to state/look up THRR;Short Term: Able  to use daily as guideline for intensity in rehab;Long Term: Able to use THRR to govern intensity when exercising independently Short Term: Able to state/look up THRR;Short Term: Able to use daily as guideline for intensity in rehab;Long Term: Able to use THRR to govern intensity when exercising independently Short Term: Able to state/look up THRR;Short Term: Able to use daily as guideline for intensity in rehab;Long Term: Able to use  THRR to govern intensity when exercising independently     Able to check pulse independently Yes Yes Yes     Intervention Provide education and demonstration on how to check pulse in carotid and radial arteries.;Review the importance of being able to check your own pulse for safety during independent exercise Provide education and demonstration on how to check pulse in carotid and radial arteries.;Review the importance of being able to check your own pulse for safety during independent exercise Provide education and demonstration on how to check pulse in carotid and radial arteries.;Review the importance of being able to check your own pulse for safety during independent exercise     Expected Outcomes Short Term: Able to explain why pulse checking is important during independent exercise;Long Term: Able to check pulse independently and accurately Short Term: Able to explain why pulse checking is important during independent exercise;Long Term: Able to check pulse independently and accurately Short Term: Able to explain why pulse checking is important during independent exercise;Long Term: Able to check pulse independently and accurately     Understanding of Exercise Prescription Yes Yes Yes     Intervention Provide education, explanation, and written materials on patient's individual exercise prescription Provide education, explanation, and written materials on patient's individual exercise prescription Provide education, explanation, and written materials on patient's individual  exercise prescription     Expected Outcomes Short Term: Able to explain program exercise prescription;Long Term: Able to explain home exercise prescription to exercise independently Short Term: Able to explain program exercise prescription;Long Term: Able to explain home exercise prescription to exercise independently Short Term: Able to explain program exercise prescription;Long Term: Able to explain home exercise prescription to exercise independently              Exercise Goals Re-Evaluation :  Exercise Goals Re-Evaluation     Row Name 11/11/21 1321 12/09/21 1308           Exercise Goal Re-Evaluation   Exercise Goals Review Increase Physical Activity;Increase Strength and Stamina;Able to understand and use rate of perceived exertion (RPE) scale;Knowledge and understanding of Target Heart Rate Range (THRR);Able to check pulse independently;Understanding of Exercise Prescription Increase Physical Activity;Increase Strength and Stamina;Able to understand and use rate of perceived exertion (RPE) scale;Knowledge and understanding of Target Heart Rate Range (THRR);Able to check pulse independently;Understanding of Exercise Prescription      Comments Pt has completed 7 sessions of cardiac rehab. He is progressing during class by increasing his workload for both pieces of equipment. He is currently exercising at 2.83 METs on the stepper. Will continue to monitor and progress as able. Pt has completed 18 sessions of cardiac rehab. He is increasing his level on both pieces of equipment recently. He is currently exercising at 2.36 METs on the stepper. Will continue to montior and progress as able.      Expected Outcomes Through exercise at rehab and home, the patient will meet their stated goals. Through exercise at rehab and home, the patient will meet their stated goals.                Discharge Exercise Prescription (Final Exercise Prescription Changes):  Exercise Prescription Changes -  12/09/21 1300       Response to Exercise   Blood Pressure (Admit) 132/62    Blood Pressure (Exercise) 116/60    Blood Pressure (Exit) 112/52    Heart Rate (Admit) 61 bpm    Heart Rate (Exercise) 97 bpm    Heart Rate (Exit) 71 bpm  Rating of Perceived Exertion (Exercise) 12    Duration Continue with 30 min of aerobic exercise without signs/symptoms of physical distress.    Intensity THRR unchanged      Progression   Progression Continue to progress workloads to maintain intensity without signs/symptoms of physical distress.      Resistance Training   Training Prescription Yes    Weight 5    Reps 10-15    Time 10 Minutes      NuStep   Level 3    SPM 105    Minutes 22    METs 2.36      Arm Ergometer   Level 3    RPM 52    Minutes 17    METs 2.17             Nutrition:  Target Goals: Understanding of nutrition guidelines, daily intake of sodium '1500mg'$ , cholesterol '200mg'$ , calories 30% from fat and 7% or less from saturated fats, daily to have 5 or more servings of fruits and vegetables.  Biometrics:  Pre Biometrics - 10/24/21 1009       Pre Biometrics   Height '5\' 9"'$  (1.753 m)    Weight 106 kg    Waist Circumference 50 inches    Hip Circumference 41 inches    Waist to Hip Ratio 1.22 %    BMI (Calculated) 34.49    Triceps Skinfold 10 mm    % Body Fat 33.3 %    Grip Strength 28.4 kg    Flexibility 0 in    Single Leg Stand 2 seconds              Nutrition Therapy Plan and Nutrition Goals:  Nutrition Therapy & Goals - 10/28/21 1049       Personal Nutrition Goals   Comments Patient scored 45 on his diet assessment. We offer 2 educational sessions on heart hearthy nutrition with handouts and assistance with RD referral if patient is interested.      Intervention Plan   Intervention Nutrition handout(s) given to patient.    Expected Outcomes Short Term Goal: Understand basic principles of dietary content, such as calories, fat, sodium, cholesterol  and nutrients.             Nutrition Assessments:  Nutrition Assessments - 10/24/21 0855       MEDFICTS Scores   Pre Score 45            MEDIFICTS Score Key: ?70 Need to make dietary changes  40-70 Heart Healthy Diet ? 40 Therapeutic Level Cholesterol Diet   Picture Your Plate Scores: <21 Unhealthy dietary pattern with much room for improvement. 41-50 Dietary pattern unlikely to meet recommendations for good health and room for improvement. 51-60 More healthful dietary pattern, with some room for improvement.  >60 Healthy dietary pattern, although there may be some specific behaviors that could be improved.    Nutrition Goals Re-Evaluation:   Nutrition Goals Discharge (Final Nutrition Goals Re-Evaluation):   Psychosocial: Target Goals: Acknowledge presence or absence of significant depression and/or stress, maximize coping skills, provide positive support system. Participant is able to verbalize types and ability to use techniques and skills needed for reducing stress and depression.  Initial Review & Psychosocial Screening:  Initial Psych Review & Screening - 10/24/21 0846       Initial Review   Current issues with None Identified      Family Dynamics   Good Support System? Yes    Comments His sons are his  support system.      Barriers   Psychosocial barriers to participate in program There are no identifiable barriers or psychosocial needs.      Screening Interventions   Interventions Encouraged to exercise    Expected Outcomes Long Term goal: The participant improves quality of Life and PHQ9 Scores as seen by post scores and/or verbalization of changes;Short Term goal: Identification and review with participant of any Quality of Life or Depression concerns found by scoring the questionnaire.             Quality of Life Scores:  Quality of Life - 10/24/21 1010       Quality of Life   Select Quality of Life      Quality of Life Scores    Health/Function Pre 20.04 %    Socioeconomic Pre 23.93 %    Psych/Spiritual Pre 18.93 %    Family Pre 22.5 %    GLOBAL Pre 21 %            Scores of 19 and below usually indicate a poorer quality of life in these areas.  A difference of  2-3 points is a clinically meaningful difference.  A difference of 2-3 points in the total score of the Quality of Life Index has been associated with significant improvement in overall quality of life, self-image, physical symptoms, and general health in studies assessing change in quality of life.  PHQ-9: Review Flowsheet       10/24/2021 09/27/2013  Depression screen PHQ 2/9  Decreased Interest 1 0  Down, Depressed, Hopeless 0 0  PHQ - 2 Score 1 0  Altered sleeping 0 -  Tired, decreased energy 2 -  Change in appetite 0 -  Feeling bad or failure about yourself  0 -  Trouble concentrating 0 -  Moving slowly or fidgety/restless 0 -  Suicidal thoughts 0 -  PHQ-9 Score 3 -  Difficult doing work/chores Somewhat difficult -   Interpretation of Total Score  Total Score Depression Severity:  1-4 = Minimal depression, 5-9 = Mild depression, 10-14 = Moderate depression, 15-19 = Moderately severe depression, 20-27 = Severe depression   Psychosocial Evaluation and Intervention:  Psychosocial Evaluation - 10/24/21 1004       Psychosocial Evaluation & Interventions   Interventions Encouraged to exercise with the program and follow exercise prescription    Comments Pt has no barriers to participating in CR. He has no identifiable psychosocial issues. He scored a 3 on his PHQ-9, and he relates this to his lack of energy since before his TAVR and his lack of pleasure in doing activities due to his fatigue. He reports that he has a good support system with his sons, and he also mentioned his grandsons and great grandsons. He reports that his goals while in the program are to lose weight and to improve his energy levels. He previously participated in CR about  8 years ago, and he is eager to begin the program again.    Expected Outcomes Pt will continue to have no identifiable psychosocial issues.    Continue Psychosocial Services  No Follow up required             Psychosocial Re-Evaluation:  Psychosocial Re-Evaluation     [place] Name 11/06/21 0806 12/02/21 1342           Psychosocial Re-Evaluation   Current issues with None Identified None Identified      Comments Patient is new to the program completing 4 sessions.  He continues to have no psychosocial barriers identified. He seems to enjoy the sessions and demonstrates an interest in improving his health. We will continue to monitor his progress. Patient has completed 14 sessions. He continues to have no psychosocial barriers identified. He continues to enjoy the sessions and continues to demonstrate an interest in improving his health. We will continue to monitor his progress.      Expected Outcomes Patient will continue to have no psychosocial barriers identified. Patient will continue to have no psychosocial barriers identified.      Interventions Stress management education;Encouraged to attend Cardiac Rehabilitation for the exercise;Relaxation education Stress management education;Encouraged to attend Cardiac Rehabilitation for the exercise;Relaxation education      Continue Psychosocial Services  No Follow up required No Follow up required               Psychosocial Discharge (Final Psychosocial Re-Evaluation):  Psychosocial Re-Evaluation - 12/02/21 1342       Psychosocial Re-Evaluation   Current issues with None Identified    Comments Patient has completed 14 sessions. He continues to have no psychosocial barriers identified. He continues to enjoy the sessions and continues to demonstrate an interest in improving his health. We will continue to monitor his progress.    Expected Outcomes Patient will continue to have no psychosocial barriers identified.    Interventions Stress  management education;Encouraged to attend Cardiac Rehabilitation for the exercise;Relaxation education    Continue Psychosocial Services  No Follow up required             Vocational Rehabilitation: Provide vocational rehab assistance to qualifying candidates.   Vocational Rehab Evaluation & Intervention:  Vocational Rehab - 10/24/21 0909       Initial Vocational Rehab Evaluation & Intervention   Assessment shows need for Vocational Rehabilitation No      Vocational Rehab Re-Eva[facility]   Comments retired             Education: Education Goals: Education classes will be provided on a weekly basis, covering required topics. Participant will state understanding/return demonstration of topics presented.  Learning Barriers/Preferences:  Learning Barriers/Preferences - 10/24/21 0901       Learning Barriers/Preferences   Learning Barriers None    Learning Preferences Skilled Demonstration;Individual Instruction             Education Topics: Hypertension, Hypertension Reduction -Define heart disease and high blood pressure. Discus how high blood pressure affects the body and ways to reduce high blood pressure.   Exercise and Your Heart -Discuss why it is important to exercise, the FITT principles of exercise, normal and abnormal responses to exercise, and how to exercise safely.   Angina -Discuss definition of angina, causes of angina, treatment of angina, and how to decrease risk of having angina.   Cardiac Medications -Review what the following cardiac medications are used for, how they affect the body, and side effects that may occur when taking the medications.  Medications include Aspirin, Beta blockers, calcium channel blockers, ACE Inhibitors, angiotensin receptor blockers, diuretics, digoxin, and antihyperlipidemics. Flowsheet Row CARDIAC REHAB PHASE II EXERCISE from 12/11/2021 in [facility]  Date 10/30/21  Educator DF  Instruction  Review Code 2- Demonstrated Understanding       Congestive Heart Failure -Discuss the definition of CHF, how to live with CHF, the signs and symptoms of CHF, and how keep track of weight and sodium intake. Flowsheet Row CARDIAC REHAB PHASE II EXERCISE from 12/11/2021 in [facility]  Date 11/06/21  Educator HB  Instruction Review Code 1- Verbalizes Understanding       Heart Disease and Intimacy -Discus the effect sexual activity has on the heart, how changes occur during intimacy as we age, and safety during sexual activity. Flowsheet Row CARDIAC REHAB PHASE II EXERCISE from 12/11/2021 in [facility]  Date 11/13/21  Educator DF  Instruction Review Code 2- Demonstrated Understanding       Smoking Cessation / COPD -Discuss different methods to quit smoking, the health benefits of quitting smoking, and the definition of COPD.   Nutrition I: Fats -Discuss the types of cholesterol, what cholesterol does to the heart, and how cholesterol levels can be controlled. Flowsheet Row CARDIAC REHAB PHASE II EXERCISE from 12/11/2021 in [facility]  Date 11/27/21  Educator HB  Instruction Review Code 1- Verbalizes Understanding       Nutrition II: Labels -Discuss the different components of food labels and how to read food label [place] from 12/11/2021 in [facility]  Date 12/04/21  Educator HB  Instruction Review Code 1- Verbalizes Understanding       Heart Parts/Heart Disease and PAD -Discuss the anatomy of the heart, the pathway of blood circulation through the heart, and these are affected by heart disease.   Stress I: Signs and Symptoms -Discuss the causes of stress, how stress may lead to anxiety and depression, and ways to limit stress.   Stress II: Relaxation -Discuss different types of relaxation techniques to limit stress.   Warning Signs of  Stroke / TIA -Discuss definition of a stroke, what the signs and symptoms are of a stroke, and how to identify when someone is having stroke.   Knowledge Questionnaire Score:  Knowledge Questionnaire Score - 10/24/21 0902       Knowledge Questionnaire Score   Pre Score 22/24             Core Components/Risk Factors/Patient Goals at Admission:  Personal Goals and Risk Factors at Admission - 10/24/21 0909       Core Components/Risk Factors/Patient Goals on Admission    Weight Management Yes;Obesity;Weight Loss    Intervention Weight Management: Develop a combined nutrition and exercise program designed to reach desired caloric intake, while maintaining appropriate intake of nutrient and fiber, sodium and fats, and appropriate energy expenditure required for the weight goal.;Weight Management: Provide education and appropriate resources to help participant work on and attain dietary goals.;Weight Management/Obesity: Establish reasonable short term and long term weight goals.;Obesity: Provide education and appropriate resources to help participant work on and attain dietary goals.    Admit Weight 234 lb (106.1 kg)    Goal Weight: Short Term 220 lb (99.8 kg)    Expected Outcomes Short Term: Continue to assess and modify interventions until short term weight is achieved;Long Term: Adherence to nutrition and physical activity/exercise program aimed toward attainment of established weight goal;Weight Maintenance: Understanding of the daily nutrition guidelines, which includes 25-35% calories from fat, 7% or less cal from saturated fats, less than '200mg'$  cholesterol, less than 1.5gm of sodium, & 5 or more servings of fruits and vegetables daily;Weight Loss: Understanding of general recommendations for a balanced deficit meal plan, which promotes 1-2 lb weight loss per week and includes a negative energy balance of [phone] kcal/d;Understanding recommendations for meals to include 15-35% energy as  protein, 25-35% energy from fat, 35-60% energy from carbohydrates, less than '200mg'$  of dietary cholesterol, 20-35 gm of  total fiber daily;Understanding of distribution of calorie intake throughout the day with the consumption of 4-5 meals/snacks    Improve shortness of breath with ADL's Yes    Intervention Provide education, individualized exercise plan and daily activity instruction to help decrease symptoms of SOB with activities of daily living.    Expected Outcomes Short Term: Improve cardiorespiratory fitness to achieve a reduction of symptoms when performing ADLs;Long Term: Be able to perform more ADLs without symptoms or delay the onset of symptoms    Diabetes Yes    Intervention Provide education about signs/symptoms and action to take for hypo/hyperglycemia.;Provide education about proper nutrition, including hydration, and aerobic/resistive exercise prescription along with prescribed medications to achieve blood glucose in normal ranges: Fasting glucose 65-99 mg/dL    Expected Outcomes Short Term: Participant verbalizes understanding of the signs/symptoms and immediate care of hyper/hypoglycemia, proper foot care and importance of medication, aerobic/resistive exercise and nutrition plan for blood glucose control.;Long Term: Attainment of HbA1C < 7%.    Hypertension Yes    Intervention Provide education on lifestyle modifcations including regular physical activity/exercise, weight management, moderate sodium restriction and increased consumption of fresh fruit, vegetables, and low fat dairy, alcohol moderation, and smoking cessation.;Monitor prescription use compliance.    Expected Outcomes Short Term: Continued assessment and intervention until BP is < 140/29m HG in hypertensive participants. < 130/830mHG in hypertensive participants with diabetes, heart failure or chronic kidney disease.;Long Term: Maintenance of blood pressure at goal levels.    Lipids Yes    Intervention Provide education  and support for participant on nutrition & aerobic/resistive exercise along with prescribed medications to achieve LDL '70mg'$ , HDL >'40mg'$ .    Expected Outcomes Long Term: Cholesterol controlled with medications as prescribed, with individualized exercise RX and with personalized nutrition plan. Value goals: LDL < '70mg'$ , HDL > 40 mg.;Short Term: Participant states understanding of desired cholesterol values and is compliant with medications prescribed. Participant is following exercise prescription and nutrition guidelines.             Core Components/Risk Factors/Patient Goals Review:   Goals and Risk Factor Review     Row Name 11/06/21 0808 12/02/21 1343           Core Components/Risk Factors/Patient Goals Review   Personal Goals Review Weight Management/Obesity;Improve shortness of breath with ADL's;Diabetes;Lipids;Hypertension;Other Weight Management/Obesity;Improve shortness of breath with ADL's;Diabetes;Lipids;Hypertension;Other      Review Patient was referred to CR with TAVR. He has multiple risk factors for CAD and is participating in the program for risk modification. He has completed 4 sessions. His current weight is 232.6 down 1.4 lbs from his initial visit. His most recent A1C was 09/13/21 at 7.8%. He is on Metformin, Farxiga, Glipizide and Levemir for DM control. His blood pressure has at goal. We will continue to monitor. His personal goals for the program are to lose weight and improve his energy. We will continue to monitor his progress as he works towards meeting these goals. Patient completed 14 sessions. His current weight is 228.0 down 4.6 lbs from his initial visit. His most recent A1C was 09/13/21 at 7.8%. He is on Metformin, Farxiga, Glipizide and Levemir for DM control. He is doing well in the program with consistent attendance and progressions. His blood pressure continues to be at goal. His personal goals for the program continue to be to lose weight and improve his energy. We  will continue to monitor his progress as he works towards meeting these goals.  Expected Outcomes Patient will complete the program meeting both personal and program goals. Patient will complete the program meeting both personal and program goals.               Core Components/Risk Factors/Patient Goals at Discharge (Final Review):   Goals and Risk Factor Review - 12/02/21 1343       Core Components/Risk Factors/Patient Goals Review   Personal Goals Review Weight Management/Obesity;Improve shortness of breath with ADL's;Diabetes;Lipids;Hypertension;Other    Review Patient completed 14 sessions. His current weight is 228.0 down 4.6 lbs from his initial visit. His most recent A1C was 09/13/21 at 7.8%. He is on Metformin, Farxiga, Glipizide and Levemir for DM control. He is doing well in the program with consistent attendance and progressions. His blood pressure continues to be at goal. His personal goals for the program continue to be to lose weight and improve his energy. We will continue to monitor his progress as he works towards meeting these goals.    Expected Outcomes Patient will complete the program meeting both personal and program goals.             ITP Comments:   Comments: ITP REVIEW Pt is making expected progress toward Cardiac Rehab goals after completing 19 sessions. Recommend continued exercise, life style modification, education, and increased stamina and strength.

## 2021-12-13 ENCOUNTER — Encounter (HOSPITAL_COMMUNITY): Payer: Medicare HMO

## 2021-12-16 ENCOUNTER — Encounter (HOSPITAL_COMMUNITY)
Admission: RE | Admit: 2021-12-16 | Discharge: 2021-12-16 | Disposition: A | Discharge status: Home / Self Care | Payer: Medicare HMO | Source: Ambulatory Visit | Attending: Cardiovascular Disease | Admitting: Cardiovascular Disease

## 2021-12-16 DIAGNOSIS — F4320 Adjustment disorder, unspecified: Secondary | ICD-10-CM | POA: Diagnosis not present

## 2021-12-16 DIAGNOSIS — R809 Proteinuria, unspecified: Secondary | ICD-10-CM | POA: Diagnosis not present

## 2021-12-16 DIAGNOSIS — Z952 Presence of prosthetic heart valve: Secondary | ICD-10-CM | POA: Diagnosis not present

## 2021-12-16 DIAGNOSIS — E870 Hyperosmolality and hypernatremia: Secondary | ICD-10-CM | POA: Diagnosis not present

## 2021-12-16 DIAGNOSIS — N200 Calculus of kidney: Secondary | ICD-10-CM | POA: Diagnosis not present

## 2021-12-16 DIAGNOSIS — E210 Primary hyperparathyroidism: Secondary | ICD-10-CM | POA: Diagnosis not present

## 2021-12-16 DIAGNOSIS — I129 Hypertensive chronic kidney disease with stage 1 through stage 4 chronic kidney disease, or unspecified chronic kidney disease: Secondary | ICD-10-CM | POA: Diagnosis not present

## 2021-12-16 DIAGNOSIS — N189 Chronic kidney disease, unspecified: Secondary | ICD-10-CM | POA: Diagnosis not present

## 2021-12-16 NOTE — Progress Notes (Signed)
Daily Session Note  Patient Details  Name: William [name] MRN: [id number] Date of Birth: 05-Sep-1945 Referring Provider:   Flowsheet Row CARDIAC REHAB PHASE II ORIENTATION from 10/24/2021 in [facility]  Referring Provider Dr. Angelena Form       Encounter Date: 12/16/2021  Check In:  Session Check In - 12/16/21 0815       Check-In   Supervising physician immediately available to respond to emergencies [facility] MD immediately available    Physician(s) Dr. Domenic Polite    Location [facility]    Staff Present Leana Roe, BS, Exercise Physiologist;Dalton Sherrie George, MS, ACSM-CEP;Madelyn Flavors, RN, BSN    Virtual Visit No    Medication changes reported     No    Fall or balance concerns reported    Yes    Comments He has not fallen but he does lose his balance often. His arthritis in his knees also causes his knees to feel weak at times.    Tobacco Cessation No Change    Warm-up and Cool-down Performed as group-led instruction    Resistance Training Performed Yes    VAD Patient? No    PAD/SET Patient? No      Pain Assessment   Currently in Pain? No/denies    Pain Score 0-No pain    Multiple Pain Sites No             Capillary Blood Glucose: No results found for this or any previous visit (from the past 24 hour(s)).    Social History   Tobacco Use  Smoking Status Former   Packs/day: 3.00   Years: 45.00   Total pack years: 135.00   Types: Cigarettes   Start date: 01/06/1962   Quit date: 10/24/2004   Years since quitting: 17.1  Smokeless Tobacco Former   Quit date: 08/10/2005    Goals Met:  Independence with exercise equipment Exercise tolerated well No report of concerns or symptoms today Strength training completed today  Goals Unmet:  Not Applicable  Comments: check out 0915   Dr. Carlyle Dolly is Medical Director for [facility]

## 2021-12-18 ENCOUNTER — Encounter (HOSPITAL_COMMUNITY)
Admission: RE | Admit: 2021-12-18 | Discharge: 2021-12-18 | Disposition: A | Discharge status: Home / Self Care | Payer: Medicare HMO | Source: Ambulatory Visit | Attending: Cardiovascular Disease | Admitting: Cardiovascular Disease

## 2021-12-18 DIAGNOSIS — Z952 Presence of prosthetic heart valve: Secondary | ICD-10-CM

## 2021-12-18 NOTE — Progress Notes (Signed)
Daily Session Note  Patient Details  Name: William [name] MRN: [id number] Date of Birth: 06/21/1945 Referring Provider:   Flowsheet Row CARDIAC REHAB PHASE II ORIENTATION from 10/24/2021 in [facility]  Referring Provider Dr. McAlhany       Encounter Date: 12/18/2021  Check In:  Session Check In - 12/18/21 0813       Check-In   Supervising physician immediately available to respond to emergencies [facility] MD immediately available    Physician(s) Dr. McDowell    Location [facility]    Staff Present Heather Bailey, BS, Exercise Physiologist;Dalton Fletcher MHA, MS, ACSM-CEP;Daphyne Martin, RN, BSN;Hillary Troutman BSN, RN    Virtual Visit No    Medication changes reported     No    Fall or balance concerns reported    Yes    Comments He has not fallen but he does lose his balance often. His arthritis in his knees also causes his knees to feel weak at times.    Tobacco Cessation No Change    Warm-up and Cool-down Performed as group-led instruction    Resistance Training Performed Yes    VAD Patient? No    PAD/SET Patient? No      Pain Assessment   Currently in Pain? No/denies    Pain Score 0-No pain    Multiple Pain Sites No             Capillary Blood Glucose: No results found for this or any previous visit (from the past 24 hour(s)).    Social History   Tobacco Use  Smoking Status Former   Packs/day: 3.00   Years: 45.00   Total pack years: 135.00   Types: Cigarettes   Start date: 01/06/1962   Quit date: 10/24/2004   Years since quitting: 17.1  Smokeless Tobacco Former   Quit date: 08/10/2005    Goals Met:  Independence with exercise equipment Exercise tolerated well No report of concerns or symptoms today Strength training completed today  Goals Unmet:  Not Applicable  Comments: Checkout at 0915.   Dr. Jonathan Branch is Medical Director for [facility] 

## 2021-12-20 ENCOUNTER — Encounter (HOSPITAL_COMMUNITY)
Admission: RE | Admit: 2021-12-20 | Discharge: 2021-12-20 | Disposition: A | Discharge status: Home / Self Care | Payer: Medicare HMO | Source: Ambulatory Visit | Attending: Cardiovascular Disease | Admitting: Cardiovascular Disease

## 2021-12-20 DIAGNOSIS — Z952 Presence of prosthetic heart valve: Secondary | ICD-10-CM | POA: Diagnosis not present

## 2021-12-20 NOTE — Progress Notes (Signed)
Daily Session Note  Patient Details  Name: William [name] MRN: [id number] Date of Birth: 1945-03-28 Referring Provider:   Flowsheet Row CARDIAC REHAB PHASE II ORIENTATION from 10/24/2021 in [facility]  Referring Provider Dr. Angelena Form       Encounter Date: 12/20/2021  Check In:  Session Check In - 12/20/21 0815       Check-In   Supervising physician immediately available to respond to emergencies [facility] MD immediately available    Physician(s) Dr. Carlyle Dolly    Location [facility]    Staff Present Leana Roe, BS, Exercise Physiologist;Dalton Sherrie George, MS, ACSM-CEP;Laguana Desautel Wynetta Emery, RN, BSN    Virtual Visit No    Medication changes reported     No    Fall or balance concerns reported    Yes    Comments He has not fallen but he does lose his balance often. His arthritis in his knees also causes his knees to feel weak at times.    Tobacco Cessation No Change    Warm-up and Cool-down Performed as group-led instruction    Resistance Training Performed Yes    VAD Patient? No    PAD/SET Patient? No      Pain Assessment   Currently in Pain? No/denies    Pain Score 0-No pain    Multiple Pain Sites No             Capillary Blood Glucose: No results found for this or any previous visit (from the past 24 hour(s)).    Social History   Tobacco Use  Smoking Status Former   Packs/day: 3.00   Years: 45.00   Total pack years: 135.00   Types: Cigarettes   Start date: 01/06/1962   Quit date: 10/24/2004   Years since quitting: 17.1  Smokeless Tobacco Former   Quit date: 08/10/2005    Goals Met:  Independence with exercise equipment Exercise tolerated well No report of concerns or symptoms today Strength training completed today  Goals Unmet:  Not Applicable  Comments: Check out 915.   Dr. Carlyle Dolly is Medical Director for [facility]

## 2021-12-23 ENCOUNTER — Encounter (HOSPITAL_COMMUNITY)
Admission: RE | Admit: 2021-12-23 | Discharge: 2021-12-23 | Disposition: A | Discharge status: Home / Self Care | Payer: Medicare HMO | Source: Ambulatory Visit | Attending: Cardiovascular Disease | Admitting: Cardiovascular Disease

## 2021-12-23 VITALS — Wt 224.6 lb

## 2021-12-23 DIAGNOSIS — Z952 Presence of prosthetic heart valve: Secondary | ICD-10-CM | POA: Diagnosis not present

## 2021-12-23 NOTE — Progress Notes (Signed)
Daily Session Note  Patient Details  Name: William [name] MRN: [id number] Date of Birth: Jul 30, 1945 Referring Provider:   Flowsheet Row CARDIAC REHAB PHASE II ORIENTATION from 10/24/2021 in [facility]  Referring Provider Dr. Angelena Form       Encounter Date: 12/23/2021  Check In:  Session Check In - 12/23/21 0758       Check-In   Supervising physician immediately available to respond to emergencies [facility] MD immediately available    Physician(s) Dr Dellia Cloud    Location [facility]    Staff Present Leana Roe, BS, Exercise Physiologist;Bradan Congrove Hassell Done, RN, BSN    Virtual Visit No    Medication changes reported     No    Fall or balance concerns reported    Yes    Comments He has not fallen but he does lose his balance often. His arthritis in his knees also causes his knees to feel weak at times.    Tobacco Cessation No Change    Warm-up and Cool-down Performed as group-led instruction    Resistance Training Performed Yes    VAD Patient? No    PAD/SET Patient? No      Pain Assessment   Currently in Pain? No/denies    Pain Score 0-No pain    Multiple Pain Sites No             Capillary Blood Glucose: No results found for this or any previous visit (from the past 24 hour(s)).    Social History   Tobacco Use  Smoking Status Former   Packs/day: 3.00   Years: 45.00   Total pack years: 135.00   Types: Cigarettes   Start date: 01/06/1962   Quit date: 10/24/2004   Years since quitting: 17.1  Smokeless Tobacco Former   Quit date: 08/10/2005    Goals Met:  Independence with exercise equipment Exercise tolerated well No report of concerns or symptoms today Strength training completed today  Goals Unmet:  Not Applicable  Comments: Checkout at [place].   Dr. Carlyle Dolly is Medical Director for [facility]

## 2021-12-25 ENCOUNTER — Encounter (HOSPITAL_COMMUNITY)
Admission: RE | Admit: 2021-12-25 | Discharge: 2021-12-25 | Disposition: A | Discharge status: Home / Self Care | Payer: Medicare HMO | Source: Ambulatory Visit | Attending: Cardiovascular Disease | Admitting: Cardiovascular Disease

## 2021-12-25 DIAGNOSIS — Z952 Presence of prosthetic heart valve: Secondary | ICD-10-CM

## 2021-12-25 NOTE — Progress Notes (Signed)
Daily Session Note  Patient Details  Name: William [name] MRN: [id number] Date of Birth: 1945/10/13 Referring Provider:   Flowsheet Row CARDIAC REHAB PHASE II ORIENTATION from 10/24/2021 in [place]  Referring Provider Dr. Angelena Form       Encounter Date: 12/25/2021  Check In:  Session Check In - 12/25/21 0815       Check-In   Supervising physician immediately available to respond to emergencies [facility] MD immediately available    Physician(s) Dr Dellia Cloud    Location [facility]    Staff Present Leana Roe, BS, Exercise Physiologist;Shalon Councilman Sherrie George, MS, ACSM-CEP;Melven Sartorius BSN, RN    Virtual Visit No    Medication changes reported     No    Fall or balance concerns reported    Yes    Comments He has not fallen but he does lose his balance often. His arthritis in his knees also causes his knees to feel weak at times.    Tobacco Cessation No Change    Warm-up and Cool-down Performed as group-led instruction    Resistance Training Performed Yes    VAD Patient? No    PAD/SET Patient? No      Pain Assessment   Currently in Pain? No/denies    Pain Score 0-No pain    Multiple Pain Sites No             Capillary Blood Glucose: No results found for this or any previous visit (from the past 24 hour(s)).    Social History   Tobacco Use  Smoking Status Former   Packs/day: 3.00   Years: 45.00   Total pack years: 135.00   Types: Cigarettes   Start date: 01/06/1962   Quit date: 10/24/2004   Years since quitting: 17.1  Smokeless Tobacco Former   Quit date: 08/10/2005    Goals Met:  Independence with exercise equipment Exercise tolerated well No report of concerns or symptoms today Strength training completed today  Goals Unmet:  Not Applicable  Comments: checkout time is 0915   Dr. Carlyle Dolly is Medical Director for [facility]

## 2021-12-27 ENCOUNTER — Encounter (HOSPITAL_COMMUNITY)
Admission: RE | Admit: 2021-12-27 | Discharge: 2021-12-27 | Disposition: A | Discharge status: Home / Self Care | Payer: Medicare HMO | Source: Ambulatory Visit | Attending: Cardiovascular Disease | Admitting: Cardiovascular Disease

## 2021-12-27 DIAGNOSIS — Z952 Presence of prosthetic heart valve: Secondary | ICD-10-CM

## 2021-12-27 NOTE — Progress Notes (Signed)
Daily Session Note  Patient Details  Name: William [name] MRN: [id number] Date of Birth: May 03, 1945 Referring Provider:   Flowsheet Row CARDIAC REHAB PHASE II ORIENTATION from 10/24/2021 in [place]  Referring Provider Dr. Angelena Form       Encounter Date: 12/27/2021  Check In:  Session Check In - 12/27/21 0815       Check-In   Supervising physician immediately available to respond to emergencies [facility] MD immediately available    Physician(s) Dr Dellia Cloud    Location [facility]    Staff Present Hoy Register MHA, MS, ACSM-CEP;Leana Roe, BS, Exercise Physiologist;Daphyne Hassell Done, RN, BSN    Virtual Visit No    Medication changes reported     No    Fall or balance concerns reported    Yes    Comments He has not fallen but he does lose his balance often. His arthritis in his knees also causes his knees to feel weak at times.    Tobacco Cessation No Change    Warm-up and Cool-down Performed as group-led instruction    Resistance Training Performed Yes    VAD Patient? No    PAD/SET Patient? No      Pain Assessment   Currently in Pain? No/denies    Pain Score 0-No pain    Multiple Pain Sites No             Capillary Blood Glucose: No results found for this or any previous visit (from the past 24 hour(s)).    Social History   Tobacco Use  Smoking Status Former   Packs/day: 3.00   Years: 45.00   Total pack years: 135.00   Types: Cigarettes   Start date: 01/06/1962   Quit date: 10/24/2004   Years since quitting: 17.1  Smokeless Tobacco Former   Quit date: 08/10/2005    Goals Met:  Independence with exercise equipment Exercise tolerated well No report of concerns or symptoms today Strength training completed today  Goals Unmet:  Not Applicable  Comments: checkout time is 0915   Dr. Carlyle Dolly is Medical Director for [facility]

## 2021-12-30 ENCOUNTER — Encounter (HOSPITAL_COMMUNITY): Payer: Medicare HMO

## 2022-01-01 ENCOUNTER — Encounter (HOSPITAL_COMMUNITY)
Admission: RE | Admit: 2022-01-01 | Discharge: 2022-01-01 | Disposition: A | Discharge status: Home / Self Care | Payer: Medicare HMO | Source: Ambulatory Visit | Attending: Cardiovascular Disease | Admitting: Cardiovascular Disease

## 2022-01-01 DIAGNOSIS — Z952 Presence of prosthetic heart valve: Secondary | ICD-10-CM | POA: Diagnosis not present

## 2022-01-01 NOTE — Progress Notes (Signed)
Daily Session Note  Patient Details  Name: William [name] MRN: [id number] Date of Birth: 07/24/1945 Referring Provider:   Flowsheet Row CARDIAC REHAB PHASE II ORIENTATION from 10/24/2021 in [place] [facility]  Referring Provider Dr. Angelena Form       Encounter Date: 01/01/2022  Check In:  Session Check In - 01/01/22 0815       Check-In   Supervising physician immediately available to respond to emergencies [facility] MD immediately available    Physician(s) [name]    Location [facility]    Staff Present Melven Sartorius BSN, RN;Jammy Stlouis Sherrie George, MS, ACSM-CEP;Leana Roe, BS, Exercise Physiologist    Virtual Visit No    Medication changes reported     No    Fall or balance concerns reported    Yes    Comments He has not fallen but he does lose his balance often. His arthritis in his knees also causes his knees to feel weak at times.    Tobacco Cessation No Change    Warm-up and Cool-down Performed as group-led instruction    Resistance Training Performed Yes    VAD Patient? No    PAD/SET Patient? No      Pain Assessment   Currently in Pain? No/denies    Pain Score 0-No pain    Multiple Pain Sites No             Capillary Blood Glucose: No results found for this or any previous visit (from the past 24 hour(s)).    Social History   Tobacco Use  Smoking Status Former   Packs/day: 3.00   Years: 45.00   Total pack years: 135.00   Types: Cigarettes   Start date: 01/06/1962   Quit date: 10/24/2004   Years since quitting: 17.2  Smokeless Tobacco Former   Quit date: 08/10/2005    Goals Met:  Independence with exercise equipment Exercise tolerated well No report of concerns or symptoms today Strength training completed today  Goals Unmet:  Not Applicable  Comments: checkout time is 0915   Dr. Carlyle Dolly is Medical Director for [facility]

## 2022-01-03 ENCOUNTER — Encounter (HOSPITAL_COMMUNITY)
Admission: RE | Admit: 2022-01-03 | Discharge: 2022-01-03 | Disposition: A | Discharge status: Home / Self Care | Payer: Medicare HMO | Source: Ambulatory Visit | Attending: Cardiovascular Disease | Admitting: Cardiovascular Disease

## 2022-01-03 DIAGNOSIS — Z952 Presence of prosthetic heart valve: Secondary | ICD-10-CM

## 2022-01-03 NOTE — Progress Notes (Signed)
Daily Session Note  Patient Details  Name: LORIK GUO MRN: [id number] Date of Birth: 02-08-1945 Referring Provider:   Flowsheet Row CARDIAC REHAB PHASE II ORIENTATION from 10/24/2021 in [place]  Referring Provider Dr. Angelena Form       Encounter Date: 01/03/2022  Check In:  Session Check In - 01/03/22 0815       Check-In   Supervising physician immediately available to respond to emergencies [facility] MD immediately available    Physician(s) [name]    Location [facility]    Staff Present Leana Roe, BS, Exercise Physiologist;[place] Sherrie George, MS, ACSM-CEP    Virtual Visit No    Medication changes reported     No    Fall or balance concerns reported    Yes    Comments He has not fallen but he does lose his balance often. His arthritis in his knees also causes his knees to feel weak at times.    Tobacco Cessation No Change    Warm-up and Cool-down Performed as group-led instruction    Resistance Training Performed Yes    VAD Patient? No    PAD/SET Patient? No      Pain Assessment   Currently in Pain? No/denies    Pain Score 0-No pain             Capillary Blood Glucose: No results found for this or any previous visit (from the past 24 hour(s)).    Social History   Tobacco Use  Smoking Status Former   Packs/day: 3.00   Years: 45.00   Total pack years: 135.00   Types: Cigarettes   Start date: 01/06/1962   Quit date: 10/24/2004   Years since quitting: 17.2  Smokeless Tobacco Former   Quit date: 08/10/2005    Goals Met:  Independence with exercise equipment Exercise tolerated well No report of concerns or symptoms today Strength training completed today  Goals Unmet:  Not Applicable  Comments: check out 0915   Dr. Carlyle Dolly is Medical Director for [facility]

## 2022-01-06 ENCOUNTER — Encounter (HOSPITAL_COMMUNITY): Payer: PPO

## 2022-01-08 ENCOUNTER — Encounter (HOSPITAL_COMMUNITY)
Admission: RE | Admit: 2022-01-08 | Discharge: 2022-01-08 | Disposition: A | Discharge status: Home / Self Care | Payer: PPO | Source: Ambulatory Visit | Attending: Cardiovascular Disease | Admitting: Cardiovascular Disease

## 2022-01-08 DIAGNOSIS — Z952 Presence of prosthetic heart valve: Secondary | ICD-10-CM | POA: Insufficient documentation

## 2022-01-08 NOTE — Progress Notes (Signed)
Daily Session Note  Patient Details  Name: William [name] MRN: [id number] Date of Birth: 28-Jul-1945 Referring Provider:   Flowsheet Row CARDIAC REHAB PHASE II ORIENTATION from 10/24/2021 in [facility] [facility]  Referring Provider Dr. Angelena Form       Encounter Date: 01/08/2022  Check In:  Session Check In - 01/08/22 0815       Check-In   Supervising physician immediately available to respond to emergencies [facility] MD immediately available    Physician(s) Dr. Domenic Polite    Location [facility]    Staff Present Hoy Register MHA, MS, ACSM-CEP;[employer] BSN, RN;Heather Mel [place], [place], Exercise Physiologist    Virtual Visit No    Medication changes reported     No    Fall or balance concerns reported    Yes    Comments He has not fallen but he does lose his balance often. His arthritis in his knees also causes his knees to feel weak at times.    Tobacco Cessation No Change    Warm-up and Cool-down Performed as group-led instruction    Resistance Training Performed Yes    VAD Patient? No    PAD/SET Patient? No      Pain Assessment   Currently in Pain? No/denies    Pain Score 0-No pain    Multiple Pain Sites No             Capillary Blood Glucose: No results found for this or any previous visit (from the past 24 hour(s)).    Social History   Tobacco Use  Smoking Status Former   Packs/day: 3.00   Years: 45.00   Total pack years: 135.00   Types: Cigarettes   Start date: 01/06/1962   Quit date: 10/24/2004   Years since quitting: 17.2  Smokeless Tobacco Former   Quit date: 08/10/2005    Goals Met:  Independence with exercise equipment Exercise tolerated well No report of concerns or symptoms today Strength training completed today  Goals Unmet:  Not Applicable  Comments: checkout time is 1445   Dr. Carlyle Dolly is Medical Director for [facility]

## 2022-01-08 NOTE — Progress Notes (Signed)
I have reviewed a Home Exercise Prescription with [name] . William [name] is currently exercising at home by walking about a mile.  The patient was advised to walk 3 days a week for 30-45 minutes.  Naithen and I discussed how to progress their exercise prescription.  The patient stated that their goals were to lose weight and gain endurance.  The patient stated that they understand the exercise prescription.  We reviewed exercise guidelines, target heart rate during exercise, RPE Scale, weather conditions, NTG use, endpoints for exercise, warmup and cool down.  Patient is encouraged to come to me with any questions. I will continue to follow up with the patient to assist them with progression and safety.

## 2022-01-08 NOTE — Progress Notes (Signed)
Cardiac Individual Treatment Plan  Patient Details  Name: William [name] MRN: [id number] Date of Birth: 30-Nov-1945 Referring Provider:   Flowsheet Row CARDIAC REHAB PHASE II ORIENTATION from 10/24/2021 in [place]  Referring Provider Dr. Angelena Form       Initial Encounter Date:  Flowsheet Row CARDIAC REHAB PHASE II ORIENTATION from 10/24/2021 in [place]  Date 10/24/21       Visit Diagnosis: S/P TAVR (transcatheter aortic valve replacement)  Patient's Home Medications on Admission:  Current Outpatient Medications:    acarbose (PRECOSE) 100 MG tablet, Take 100 mg by mouth 3 (three) times daily with meals. , Disp: , Rfl:    ACCU-CHEK GUIDE test strip, , Disp: , Rfl:    Accu-Chek Softclix Lancets lancets, 1 each 3 (three) times daily., Disp: , Rfl:    Acetylcysteine (NAC 600) 600 MG CAPS, Take 1,200 mg by mouth in the morning., Disp: , Rfl:    alendronate (FOSAMAX) 70 MG tablet, Take 1 tablet (70 mg total) by mouth every 7 (seven) days. Take with a full glass of water on an empty stomach. (Patient taking differently: Take 70 mg by mouth every 7 (seven) days. Take with a full glass of water on an empty stomach. Taken [employer]), Disp: 4 tablet, Rfl: 11   amLODipine (NORVASC) 5 MG tablet, TAKE 1 TABLET(5 MG) BY MOUTH DAILY (Patient taking differently: Take 5 mg by mouth every evening.), Disp: 90 tablet, Rfl: 0   apixaban (ELIQUIS) 5 MG TABS tablet, Take 1 tablet (5 mg total) by mouth 2 (two) times daily., Disp: 60 tablet, Rfl: 2   Ascorbic Acid (VITAMIN C) 1000 MG tablet, Take 1,000 mg by mouth every morning., Disp: , Rfl:    aspirin EC 81 MG tablet, Take 81 mg by mouth in the morning. Swallow whole., Disp: , Rfl:    Cholecalciferol (VITAMIN D3) 125 MCG (5000 UT) TABS, Take 5,000 Units by mouth in the morning., Disp: , Rfl:    clopidogrel (PLAVIX) 75 MG tablet, Take 1 tablet (75 mg total) by mouth daily., Disp: 90 tablet, Rfl: 3    Coenzyme Q10 (COQ10) 100 MG CAPS, Take 100 mg by mouth in the morning., Disp: , Rfl:    FARXIGA 10 MG TABS tablet, Take 10 mg by mouth in the morning., Disp: , Rfl:    ferrous sulfate 325 (65 FE) MG EC tablet, Take 1 tablet (325 mg total) by mouth daily with breakfast., Disp: 30 tablet, Rfl: 3   furosemide (LASIX) 20 MG tablet, Take 20 mg by mouth 2 (two) times daily. , Disp: , Rfl: 0   glipiZIDE (GLUCOTROL) 10 MG tablet, Take 10 mg by mouth 2 (two) times daily., Disp: , Rfl:    Glucosamine-Chondroitin (MOVE FREE PO), Take 1 tablet by mouth in the morning and at bedtime., Disp: , Rfl:    HYDROcodone-acetaminophen (NORCO) 10-325 MG tablet, Take 1 tablet by mouth every 6 (six) hours as needed for moderate pain., Disp: , Rfl:    LEVEMIR FLEXTOUCH 100 UNIT/ML Pen, Inject 90 Units into the skin at bedtime. , Disp: , Rfl:    lisinopril (ZESTRIL) 20 MG tablet, Take 1 tablet (20 mg total) by mouth daily. (Patient taking differently: Take 20 mg by mouth every evening.), Disp: 90 tablet, Rfl: 1   Magnesium 400 MG TABS, Take 400 mg by mouth in the morning., Disp: , Rfl:    Menatetrenone (VITAMIN K2) 100 MCG TABS, Take 100 mcg by mouth in the morning., Disp: ,  Rfl:    metFORMIN (GLUCOPHAGE) 1000 MG tablet, Take 1,000 mg by mouth 2 (two) times daily., Disp: , Rfl: 3   metFORMIN (GLUCOPHAGE-XR) 500 MG 24 hr tablet, Take 500 mg by mouth every evening. Take with 1000 Metformin, Disp: , Rfl:    metoprolol tartrate (LOPRESSOR) 25 MG tablet, Take 1 tablet (25 mg total) by mouth 2 (two) times daily., Disp: 60 tablet, Rfl: 2   nitroGLYCERIN (NITROSTAT) 0.4 MG SL tablet, Place 1 tablet (0.4 mg total) under the tongue every 5 (five) minutes x 3 doses as needed for chest pain (if no relief after 2nd dose, proceed to the ED for an evalution or call 911)., Disp: 75 tablet, Rfl: 2   pantoprazole (PROTONIX) 40 MG tablet, Take 40 mg by mouth every evening. , Disp: , Rfl:    potassium citrate (UROCIT-K) 5 MEQ (540 MG) SR tablet,  Take 5 mEq by mouth 3 (three) times daily with meals., Disp: , Rfl:    QUERCETIN PO, Take 1,000 mg by mouth in the morning., Disp: , Rfl:    rosuvastatin (CRESTOR) 20 MG tablet, Take 20 mg by mouth every evening. , Disp: , Rfl:    silodosin (RAPAFLO) 8 MG CAPS capsule, Take 1 capsule (8 mg total) by mouth daily with breakfast., Disp: 90 capsule, Rfl: 3   trolamine salicylate (BLUE-EMU HEMP) 10 % cream, Apply 1 application topically as needed for muscle pain., Disp: , Rfl:    vitamin B-12 (CYANOCOBALAMIN) 1000 MCG tablet, Take 1,000 mcg by mouth in the morning., Disp: , Rfl:    zinc gluconate 50 MG tablet, Take 50 mg by mouth in the morning., Disp: , Rfl:   Past Medical History: Past Medical History:  Diagnosis Date   Anxiety    Aortic stenosis    Arthritis    CAD (coronary artery disease)    a. s/p DES to distal RCA in 08/2013, DES to LAD 07/2021   Cancer [facility])    prostate   CKD (chronic kidney disease)    Diabetes mellitus without complication (HCC)    Family history of colon cancer    GERD (gastroesophageal reflux disease)    Heart murmur    Hypercalcemia    Hypercholesteremia    Hypertension    Kidney stone    PAF (paroxysmal atrial fibrillation) (HCC)    Personal history of colonic polyps    Pneumonia    S/P TAVR (transcatheter aortic valve replacement) 09/17/2021   s/p TAVR with a 23 mm Edwards S3UR via the TF approach by Dr. Angelena Form & Bartle    Tobacco Use: Social History   Tobacco Use  Smoking Status Former   Packs/day: 3.00   Years: 45.00   Total pack years: 135.00   Types: Cigarettes   Start date: 01/06/1962   Quit date: 10/24/2004   Years since quitting: 17.2  Smokeless Tobacco Former   Quit date: 08/10/2005    Labs: Review Flowsheet  More data exists      Latest Ref Rng & Units 08/06/2009 10/21/2014 07/15/2021 09/13/2021 09/17/2021  Labs for ITP Cardiac and Pulmonary Rehab  Hemoglobin A1c 4.8 - 5.6 % - - - 7.0  -  PH, Arterial 7.35 - 7.45 - - 7.367  7.372  -  -  PCO2 arterial 32 - 48 mmHg - - 36.8  35.9  - -  Bicarbonate 20.0 - 28.0 mmol/L - - 24.2  21.1  20.9  - -  TCO2 22 - 32 mmol/L 26  25  26  22  22  - 23   Acid-base deficit 0.0 - 2.0 mmol/L - - 2.0  4.0  4.0  - -  O2 Saturation % - - 60  97  98  - -    Capillary Blood Glucose: Lab Results  Component Value Date   GLUCAP 235 (H) 09/23/2021   GLUCAP 187 (H) 09/23/2021   GLUCAP 180 (H) 09/22/2021   GLUCAP 174 (H) 09/22/2021   GLUCAP 170 (H) 09/22/2021    POCT Glucose     Row Name 10/24/21 0912             POCT Blood Glucose   Pre-Exercise 116 mg/dL                Exercise Target Goals: Exercise Program Goal: Individual exercise prescription set using results from initial 6 min walk test and THRR while considering  patient's activity barriers and safety.   Exercise Prescription Goal: Starting with aerobic activity 30 plus minutes a day, 3 days per week for initial exercise prescription. Provide home exercise prescription and guidelines that participant acknowledges understanding prior to discharge.  Activity Barriers & Risk Stratification:  Activity Barriers & Cardiac Risk Stratification - 10/24/21 0839       Activity Barriers & Cardiac Risk Stratification   Activity Barriers Arthritis;Back Problems;Deconditioning;Shortness of Breath;Chest Pain/Angina;Balance Concerns    Cardiac Risk Stratification High             6 Minute Walk:  6 Minute Walk     Row Name 10/24/21 1003         6 Minute Walk   Phase Initial     Distance 1100 feet     Walk Time 6 minutes     # of Rest Breaks 0     MPH 2.03     METS 1.75     RPE 13     VO2 Peak 6.15     Symptoms No     Resting HR 66 bpm     Resting BP 116/54     Resting Oxygen Saturation  97 %     Exercise Oxygen Saturation  during 6 min walk 98 %     Max Ex. HR 84 bpm     Max Ex. BP 136/54     2 Minute Post BP 120/54              Oxygen Initial Assessment:   Oxygen Re-Evaluation:   Oxygen  Discharge (Final Oxygen Re-Evaluation):   Initial Exercise Prescription:  Initial Exercise Prescription - 10/24/21 1000       Date of Initial Exercise RX and Referring Provider   Date 10/24/21    Referring Provider Dr. Angelena Form    Expected Discharge Date 01/17/22      NuStep   Level 1    SPM 60    Minutes 17      Arm Ergometer   Level 1    RPM 45    Minutes 22      Prescription Details   Frequency (times per week) 3    Duration Progress to 30 minutes of continuous aerobic without signs/symptoms of physical distress      Intensity   THRR 40-80% of Max Heartrate 58-116    Ratings of Perceived Exertion 11-13    Perceived Dyspnea 0-4      Resistance Training   Training Prescription Yes    Weight 3    Reps 10-15  Perform Capillary Blood Glucose checks as needed.  Exercise Prescription Changes:   Exercise Prescription Changes     Row Name 10/28/21 1200 11/11/21 1300 11/25/21 1000 12/09/21 1300 12/23/21 1000     Response to Exercise   Blood Pressure (Admit) 120/62 102/52 120/58 132/62 118/52   Blood Pressure (Exercise) 144/56 110/60 122/58 116/60 140/60   Blood Pressure (Exit) 126/62 100/50 110/60 112/52 118/60   Heart Rate (Admit) 67 bpm 65 bpm 64 bpm 61 bpm 70 bpm   Heart Rate (Exercise) 93 bpm 92 bpm 85 bpm 97 bpm 102 bpm   Heart Rate (Exit) 76 bpm 74 bpm 73 bpm 71 bpm 80 bpm   Rating of Perceived Exertion (Exercise) '13 12 13 12 12   '$ Duration Continue with 30 min of aerobic exercise without signs/symptoms of physical distress. Continue with 30 min of aerobic exercise without signs/symptoms of physical distress. Continue with 30 min of aerobic exercise without signs/symptoms of physical distress. Continue with 30 min of aerobic exercise without signs/symptoms of physical distress. Continue with 30 min of aerobic exercise without signs/symptoms of physical distress.   Intensity THRR unchanged THRR unchanged THRR unchanged THRR unchanged THRR unchanged      Progression   Progression Continue to progress workloads to maintain intensity without signs/symptoms of physical distress. Continue to progress workloads to maintain intensity without signs/symptoms of physical distress. Continue to progress workloads to maintain intensity without signs/symptoms of physical distress. Continue to progress workloads to maintain intensity without signs/symptoms of physical distress. Continue to progress workloads to maintain intensity without signs/symptoms of physical distress.     Resistance Training   Training Prescription Yes Yes Yes Yes Yes   Weight '2 2 5 5 5   '$ Reps 10-15 10-15 10-15 10-15 10-15   Time 10 Minutes 10 Minutes 10 Minutes 10 Minutes 10 Minutes     NuStep   Level '1 2 3 3 4   '$ SPM 66 123 98 105 103   Minutes '17 22 22 22 22   '$ METs 1.75 2.83 2.77 2.36 2.66     Arm Ergometer   Level '1 2 2 3 3   '$ RPM 120 56 54 52 51   Minutes '22 17 17 17 17   '$ METs 2.98 2.06 2.01 2.17 2.08    Row Name 01/03/22 1200             Response to Exercise   Blood Pressure (Admit) 112/64       Blood Pressure (Exercise) 118/60       Blood Pressure (Exit) 112/60       Heart Rate (Admit) 67 bpm       Heart Rate (Exercise) 90 bpm       Heart Rate (Exit) 75 bpm       Rating of Perceived Exertion (Exercise) 12       Duration Continue with 30 min of aerobic exercise without signs/symptoms of physical distress.       Intensity THRR unchanged         Progression   Progression Continue to progress workloads to maintain intensity without signs/symptoms of physical distress.         Resistance Training   Training Prescription Yes       Weight 5       Reps 10-15       Time 10 Minutes         NuStep   Level 3       SPM 124  Minutes 22       METs 3.2         Arm Ergometer   Level 3       RPM 51       Minutes 17       METs 2.09                Exercise Comments:   Exercise Goals and Review:   Exercise Goals     Row Name 10/24/21 1008  11/11/21 1321 12/09/21 1307 01/07/22 1117       Exercise Goals   Increase Physical Activity Yes Yes Yes Yes    Intervention Provide advice, education, support and counseling about physical activity/exercise needs.;Develop an individualized exercise prescription for aerobic and resistive training based on initial evaluation findings, risk stratification, comorbidities and participant's personal goals. Provide advice, education, support and counseling about physical activity/exercise needs.;Develop an individualized exercise prescription for aerobic and resistive training based on initial evaluation findings, risk stratification, comorbidities and participant's personal goals. Provide advice, education, support and counseling about physical activity/exercise needs.;Develop an individualized exercise prescription for aerobic and resistive training based on initial evaluation findings, risk stratification, comorbidities and participant's personal goals. Provide advice, education, support and counseling about physical activity/exercise needs.;Develop an individualized exercise prescription for aerobic and resistive training based on initial evaluation findings, risk stratification, comorbidities and participant's personal goals.    Expected Outcomes Short Term: Attend rehab on a regular basis to increase amount of physical activity.;Long Term: Add in home exercise to make exercise part of routine and to increase amount of physical activity.;Long Term: Exercising regularly at least 3-5 days a week. Short Term: Attend rehab on a regular basis to increase amount of physical activity.;Long Term: Add in home exercise to make exercise part of routine and to increase amount of physical activity.;Long Term: Exercising regularly at least 3-5 days a week. Short Term: Attend rehab on a regular basis to increase amount of physical activity.;Long Term: Add in home exercise to make exercise part of routine and to increase amount  of physical activity.;Long Term: Exercising regularly at least 3-5 days a week. Short Term: Attend rehab on a regular basis to increase amount of physical activity.;Long Term: Add in home exercise to make exercise part of routine and to increase amount of physical activity.;Long Term: Exercising regularly at least 3-5 days a week.    Increase Strength and Stamina Yes Yes Yes Yes    Intervention Provide advice, education, support and counseling about physical activity/exercise needs.;Develop an individualized exercise prescription for aerobic and resistive training based on initial evaluation findings, risk stratification, comorbidities and participant's personal goals. Provide advice, education, support and counseling about physical activity/exercise needs.;Develop an individualized exercise prescription for aerobic and resistive training based on initial evaluation findings, risk stratification, comorbidities and participant's personal goals. Provide advice, education, support and counseling about physical activity/exercise needs.;Develop an individualized exercise prescription for aerobic and resistive training based on initial evaluation findings, risk stratification, comorbidities and participant's personal goals. Provide advice, education, support and counseling about physical activity/exercise needs.;Develop an individualized exercise prescription for aerobic and resistive training based on initial evaluation findings, risk stratification, comorbidities and participant's personal goals.    Expected Outcomes Short Term: Increase workloads from initial exercise prescription for resistance, speed, and METs.;Short Term: Perform resistance training exercises routinely during rehab and add in resistance training at home;Long Term: Improve cardiorespiratory fitness, muscular endurance and strength as measured by increased METs and functional capacity (6MWT) Short Term: Increase workloads from initial  exercise  prescription for resistance, speed, and METs.;Short Term: Perform resistance training exercises routinely during rehab and add in resistance training at home;Long Term: Improve cardiorespiratory fitness, muscular endurance and strength as measured by increased METs and functional capacity (6MWT) Short Term: Increase workloads from initial exercise prescription for resistance, speed, and METs.;Short Term: Perform resistance training exercises routinely during rehab and add in resistance training at home;Long Term: Improve cardiorespiratory fitness, muscular endurance and strength as measured by increased METs and functional capacity (6MWT) Short Term: Increase workloads from initial exercise prescription for resistance, speed, and METs.;Short Term: Perform resistance training exercises routinely during rehab and add in resistance training at home;Long Term: Improve cardiorespiratory fitness, muscular endurance and strength as measured by increased METs and functional capacity (6MWT)    Able to understand and use rate of perceived exertion (RPE) scale Yes Yes Yes Yes    Intervention Provide education and explanation on how to use RPE scale Provide education and explanation on how to use RPE scale Provide education and explanation on how to use RPE scale Provide education and explanation on how to use RPE scale    Expected Outcomes Short Term: Able to use RPE daily in rehab to express subjective intensity level;Long Term:  Able to use RPE to guide intensity level when exercising independently Short Term: Able to use RPE daily in rehab to express subjective intensity level;Long Term:  Able to use RPE to guide intensity level when exercising independently Short Term: Able to use RPE daily in rehab to express subjective intensity level;Long Term:  Able to use RPE to guide intensity level when exercising independently Short Term: Able to use RPE daily in rehab to express subjective intensity level;Long Term:  Able to  use RPE to guide intensity level when exercising independently    Knowledge and understanding of Target Heart Rate Range (THRR) Yes Yes Yes Yes    Intervention Provide education and explanation of THRR including how the numbers were predicted and where they are located for reference Provide education and explanation of THRR including how the numbers were predicted and where they are located for reference Provide education and explanation of THRR including how the numbers were predicted and where they are located for reference Provide education and explanation of THRR including how the numbers were predicted and where they are located for reference    Expected Outcomes Short Term: Able to state/look up THRR;Short Term: Able to use daily as guideline for intensity in rehab;Long Term: Able to use THRR to govern intensity when exercising independently Short Term: Able to state/look up THRR;Short Term: Able to use daily as guideline for intensity in rehab;Long Term: Able to use THRR to govern intensity when exercising independently Short Term: Able to state/look up THRR;Short Term: Able to use daily as guideline for intensity in rehab;Long Term: Able to use THRR to govern intensity when exercising independently Short Term: Able to state/look up THRR;Short Term: Able to use daily as guideline for intensity in rehab;Long Term: Able to use THRR to govern intensity when exercising independently    Able to check pulse independently Yes Yes Yes Yes    Intervention Provide education and demonstration on how to check pulse in carotid and radial arteries.;Review the importance of being able to check your own pulse for safety during independent exercise Provide education and demonstration on how to check pulse in carotid and radial arteries.;Review the importance of being able to check your own pulse for safety during independent exercise Provide education and  demonstration on how to check pulse in carotid and radial  arteries.;Review the importance of being able to check your own pulse for safety during independent exercise Provide education and demonstration on how to check pulse in carotid and radial arteries.;Review the importance of being able to check your own pulse for safety during independent exercise    Expected Outcomes Short Term: Able to explain why pulse checking is important during independent exercise;Long Term: Able to check pulse independently and accurately Short Term: Able to explain why pulse checking is important during independent exercise;Long Term: Able to check pulse independently and accurately Short Term: Able to explain why pulse checking is important during independent exercise;Long Term: Able to check pulse independently and accurately Short Term: Able to explain why pulse checking is important during independent exercise;Long Term: Able to check pulse independently and accurately    Understanding of Exercise Prescription Yes Yes Yes Yes    Intervention Provide education, explanation, and written materials on patient's individual exercise prescription Provide education, explanation, and written materials on patient's individual exercise prescription Provide education, explanation, and written materials on patient's individual exercise prescription Provide education, explanation, and written materials on patient's individual exercise prescription    Expected Outcomes Short Term: Able to explain program exercise prescription;Long Term: Able to explain home exercise prescription to exercise independently Short Term: Able to explain program exercise prescription;Long Term: Able to explain home exercise prescription to exercise independently Short Term: Able to explain program exercise prescription;Long Term: Able to explain home exercise prescription to exercise independently Short Term: Able to explain program exercise prescription;Long Term: Able to explain home exercise prescription to exercise  independently             Exercise Goals Re-Evaluation :  Exercise Goals Re-Evaluation     Row Name 11/11/21 1321 12/09/21 1308 01/07/22 1117         Exercise Goal Re-Evaluation   Exercise Goals Review Increase Physical Activity;Increase Strength and Stamina;Able to understand and use rate of perceived exertion (RPE) scale;Knowledge and understanding of Target Heart Rate Range (THRR);Able to check pulse independently;Understanding of Exercise Prescription Increase Physical Activity;Increase Strength and Stamina;Able to understand and use rate of perceived exertion (RPE) scale;Knowledge and understanding of Target Heart Rate Range (THRR);Able to check pulse independently;Understanding of Exercise Prescription Increase Physical Activity;Increase Strength and Stamina;Able to understand and use rate of perceived exertion (RPE) scale;Knowledge and understanding of Target Heart Rate Range (THRR);Able to check pulse independently;Understanding of Exercise Prescription     Comments Pt has completed 7 sessions of cardiac rehab. He is progressing during class by increasing his workload for both pieces of equipment. He is currently exercising at 2.83 METs on the stepper. Will continue to monitor and progress as able. Pt has completed 18 sessions of cardiac rehab. He is increasing his level on both pieces of equipment recently. He is currently exercising at 2.36 METs on the stepper. Will continue to montior and progress as able. Pt has completed 27 seesion of CR. He continues to increase his level and steps. He is currently exercising at 3.20 METs on the stepper. Will comtinue to monitor and progress as able.,     Expected Outcomes Through exercise at rehab and home, the patient will meet their stated goals. Through exercise at rehab and home, the patient will meet their stated goals. Through exercise at rehab and home, the patient will meet their stated goals.               Discharge Exercise  Prescription (Final Exercise Prescription Changes):  Exercise Prescription Changes - 01/03/22 1200       Response to Exercise   Blood Pressure (Admit) 112/64    Blood Pressure (Exercise) 118/60    Blood Pressure (Exit) 112/60    Heart Rate (Admit) 67 bpm    Heart Rate (Exercise) 90 bpm    Heart Rate (Exit) 75 bpm    Rating of Perceived Exertion (Exercise) 12    Duration Continue with 30 min of aerobic exercise without signs/symptoms of physical distress.    Intensity THRR unchanged      Progression   Progression Continue to progress workloads to maintain intensity without signs/symptoms of physical distress.      Resistance Training   Training Prescription Yes    Weight 5    Reps 10-15    Time 10 Minutes      NuStep   Level 3    SPM 124    Minutes 22    METs 3.2      Arm Ergometer   Level 3    RPM 51    Minutes 17    METs 2.09             Nutrition:  Target Goals: Understanding of nutrition guidelines, daily intake of sodium '1500mg'$ , cholesterol '200mg'$ , calories 30% from fat and 7% or less from saturated fats, daily to have 5 or more servings of fruits and vegetables.  Biometrics:  Pre Biometrics - 10/24/21 1009       Pre Biometrics   Height '5\' 9"'$  (1.753 m)    Weight 106 kg    Waist Circumference 50 inches    Hip Circumference 41 inches    Waist to Hip Ratio 1.22 %    BMI (Calculated) 34.49    Triceps Skinfold 10 mm    % Body Fat 33.3 %    Grip Strength 28.4 kg    Flexibility 0 in    Single Leg Stand 2 seconds              Nutrition Therapy Plan and Nutrition Goals:  Nutrition Therapy & Goals - 10/28/21 1049       Personal Nutrition Goals   Comments Patient scored 45 on his diet assessment. We offer 2 educational sessions on heart hearthy nutrition with handouts and assistance with RD referral if patient is interested.      Intervention Plan   Intervention Nutrition handout(s) given to patient.    Expected Outcomes Short Term Goal:  Understand basic principles of dietary content, such as calories, fat, sodium, cholesterol and nutrients.             Nutrition Assessments:  Nutrition Assessments - 10/24/21 0855       MEDFICTS Scores   Pre Score 45            MEDIFICTS Score Key: ?70 Need to make dietary changes  40-70 Heart Healthy Diet ? 40 Therapeutic Level Cholesterol Diet   Picture Your Plate Scores: <89 Unhealthy dietary pattern with much room for improvement. 41-50 Dietary pattern unlikely to meet recommendations for good health and room for improvement. 51-60 More healthful dietary pattern, with some room for improvement.  >60 Healthy dietary pattern, although there may be some specific behaviors that could be improved.    Nutrition Goals Re-Evaluation:   Nutrition Goals Discharge (Final Nutrition Goals Re-Evaluation):   Psychosocial: Target Goals: Acknowledge presence or absence of significant depression and/or stress, maximize coping skills, provide positive support system. Participant is  able to verbalize types and ability to use techniques and skills needed for reducing stress and depression.  Initial Review & Psychosocial Screening:  Initial Psych Review & Screening - 10/24/21 0846       Initial Review   Current issues with None Identified      Family Dynamics   Good Support System? Yes    Comments His sons are his support system.      Barriers   Psychosocial barriers to participate in program There are no identifiable barriers or psychosocial needs.      Screening Interventions   Interventions Encouraged to exercise    Expected Outcomes Long Term goal: The participant improves quality of Life and PHQ9 Scores as seen by post scores and/or verbalization of changes;Short Term goal: Identification and review with participant of any Quality of Life or Depression concerns found by scoring the questionnaire.             Quality of Life Scores:  Quality of Life - 10/24/21  1010       Quality of Life   Select Quality of Life      Quality of Life Scores   Health/Function Pre 20.04 %    Socioeconomic Pre 23.93 %    Psych/Spiritual Pre 18.93 %    Family Pre 22.5 %    GLOBAL Pre 21 %            Scores of 19 and below usually indicate a poorer quality of life in these areas.  A difference of  2-3 points is a clinically meaningful difference.  A difference of 2-3 points in the total score of the Quality of Life Index has been associated with significant improvement in overall quality of life, self-image, physical symptoms, and general health in studies assessing change in quality of life.  PHQ-9: Review Flowsheet       10/24/2021 09/27/2013  Depression screen PHQ 2/9  Decreased Interest 1 0  Down, Depressed, Hopeless 0 0  PHQ - 2 Score 1 0  Altered sleeping 0 -  Tired, decreased energy 2 -  Change in appetite 0 -  Feeling bad or failure about yourself  0 -  Trouble concentrating 0 -  Moving slowly or fidgety/restless 0 -  Suicidal thoughts 0 -  PHQ-9 Score 3 -  Difficult doing work/chores Somewhat difficult -   Interpretation of Total Score  Total Score Depression Severity:  1-4 = Minimal depression, 5-9 = Mild depression, 10-14 = Moderate depression, 15-19 = Moderately severe depression, 20-27 = Severe depression   Psychosocial Evaluation and Intervention:  Psychosocial Evaluation - 10/24/21 1004       Psychosocial Evaluation & Interventions   Interventions Encouraged to exercise with the program and follow exercise prescription    Comments Pt has no barriers to participating in CR. He has no identifiable psychosocial issues. He scored a 3 on his PHQ-9, and he relates this to his lack of energy since before his TAVR and his lack of pleasure in doing activities due to his fatigue. He reports that he has a good support system with his sons, and he also mentioned his grandsons and great grandsons. He reports that his goals while in the program  are to lose weight and to improve his energy levels. He previously participated in CR about 8 years ago, and he is eager to begin the program again.    Expected Outcomes Pt will continue to have no identifiable psychosocial issues.    Continue Psychosocial Services  No Follow up required             Psychosocial Re-Evaluation:  Psychosocial Re-Evaluation     [place] Name 11/06/21 0806 12/02/21 1342 01/01/22 1144         Psychosocial Re-Evaluation   Current issues with None Identified None Identified None Identified     Comments Patient is new to the program completing 4 sessions. He continues to have no psychosocial barriers identified. He seems to enjoy the sessions and demonstrates an interest in improving his health. We will continue to monitor his progress. Patient has completed 14 sessions. He continues to have no psychosocial barriers identified. He continues to enjoy the sessions and continues to demonstrate an interest in improving his health. We will continue to monitor his progress. Patient has completed 25 sessions. He continues to have no psychosocial barriers identified. He continues to enjoy the sessions and continues to demonstrate an interest in improving his health. We will continue to monitor his progress.     Expected Outcomes Patient will continue to have no psychosocial barriers identified. Patient will continue to have no psychosocial barriers identified. Patient will continue to have no psychosocial barriers identified.     Interventions Stress management education;Encouraged to attend Cardiac Rehabilitation for the exercise;Relaxation education Stress management education;Encouraged to attend Cardiac Rehabilitation for the exercise;Relaxation education Stress management education;Encouraged to attend Cardiac Rehabilitation for the exercise;Relaxation education     Continue Psychosocial Services  No Follow up required No Follow up required No Follow up required               Psychosocial Discharge (Final Psychosocial Re-Evaluation):  Psychosocial Re-Evaluation - 01/01/22 1144       Psychosocial Re-Evaluation   Current issues with None Identified    Comments Patient has completed 25 sessions. He continues to have no psychosocial barriers identified. He continues to enjoy the sessions and continues to demonstrate an interest in improving his health. We will continue to monitor his progress.    Expected Outcomes Patient will continue to have no psychosocial barriers identified.    Interventions Stress management education;Encouraged to attend Cardiac Rehabilitation for the exercise;Relaxation education    Continue Psychosocial Services  No Follow up required             Vocational Rehabilitation: Provide vocational rehab assistance to qualifying candidates.   Vocational Rehab Evaluation & Intervention:  Vocational Rehab - 10/24/21 0909       Initial Vocational Rehab Evaluation & Intervention   Assessment shows need for Vocational Rehabilitation No      Vocational Rehab Re-Evaulation   Comments retired             Education: Education Goals: Education classes will be provided on a weekly basis, covering required topics. Participant will state understanding/return demonstration of topics presented.  Learning Barriers/Preferences:  Learning Barriers/Preferences - 10/24/21 0901       Learning Barriers/Preferences   Learning Barriers None    Learning Preferences Skilled Demonstration;Individual Instruction             Education Topics: Hypertension, Hypertension Reduction -Define heart disease and high blood pressure. Discus how high blood pressure affects the body and ways to reduce high blood pressure.   Exercise and Your Heart -Discuss why it is important to exercise, the FITT principles of exercise, normal and abnormal responses to exercise, and how to exercise safely.   Angina -Discuss definition of angina, causes of  angina, treatment of angina, and how to decrease risk of  having angina.   Cardiac Medications -Review what the following cardiac medications are used for, how they affect the body, and side effects that may occur when taking the medications.  Medications include Aspirin, Beta blockers, calcium channel blockers, ACE Inhibitors, angiotensin receptor blockers, diuretics, digoxin, and antihyperlipidemics. Flowsheet Row CARDIAC REHAB PHASE II EXERCISE from 01/01/2022 in [place]  Date 10/30/21  Educator DF  Instruction Review Code 2- Demonstrated Understanding       Congestive Heart Failure -Discuss the definition of CHF, how to live with CHF, the signs and symptoms of CHF, and how keep track of weight and sodium intake. Flowsheet Row CARDIAC REHAB PHASE II EXERCISE from 01/01/2022 in [place]  Date 11/06/21  Educator HB  Instruction Review Code 1- Verbalizes Understanding       Heart Disease and Intimacy -Discus the effect sexual activity has on the heart, how changes occur during intimacy as we age, and safety during sexual activity. Flowsheet Row CARDIAC REHAB PHASE II EXERCISE from 01/01/2022 in [place]  Date 11/13/21  Educator DF  Instruction Review Code 2- Demonstrated Understanding       Smoking Cessation / COPD -Discuss different methods to quit smoking, the health benefits of quitting smoking, and the definition of COPD.   Nutrition I: Fats -Discuss the types of cholesterol, what cholesterol does to the heart, and how cholesterol levels can be controlled. Flowsheet Row CARDIAC REHAB PHASE II EXERCISE from 01/01/2022 in [place]  Date 11/27/21  Educator HB  Instruction Review Code 1- Verbalizes Understanding       Nutrition II: Labels -Discuss the different components of food labels and how to read food label [place] from  01/01/2022 in [place]  Date 12/04/21  Educator HB  Instruction Review Code 1- Verbalizes Understanding       Heart Parts/Heart Disease and PAD -Discuss the anatomy of the heart, the pathway of blood circulation through the heart, and these are affected by heart disease.   Stress I: Signs and Symptoms -Discuss the causes of stress, how stress may lead to anxiety and depression, and ways to limit stress. Flowsheet Row CARDIAC REHAB PHASE II EXERCISE from 01/01/2022 in [place]  Date 12/25/21  Educator HB  Instruction Review Code 2- Demonstrated Understanding       Stress II: Relaxation -Discuss different types of relaxation techniques to limit stress. Flowsheet Row CARDIAC REHAB PHASE II EXERCISE from 01/01/2022 in [place]  Date 01/01/22  Educator DF  Instruction Review Code 2- Demonstrated Understanding       Warning Signs of Stroke / TIA -Discuss definition of a stroke, what the signs and symptoms are of a stroke, and how to identify when someone is having stroke.   Knowledge Questionnaire Score:  Knowledge Questionnaire Score - 10/24/21 0902       Knowledge Questionnaire Score   Pre Score 22/24             Core Components/Risk Factors/Patient Goals at Admission:  Personal Goals and Risk Factors at Admission - 10/24/21 0909       Core Components/Risk Factors/Patient Goals on Admission    Weight Management Yes;Obesity;Weight Loss    Intervention Weight Management: Develop a combined nutrition and exercise program designed to reach desired caloric intake, while maintaining appropriate intake of nutrient and fiber, sodium and fats, and appropriate energy expenditure required for the weight goal.;Weight Management:  Provide education and appropriate resources to help participant work on and attain dietary goals.;Weight Management/Obesity: Establish reasonable short term and long term weight  goals.;Obesity: Provide education and appropriate resources to help participant work on and attain dietary goals.    Admit Weight 234 lb (106.1 kg)    Goal Weight: Short Term 220 lb (99.8 kg)    Expected Outcomes Short Term: Continue to assess and modify interventions until short term weight is achieved;Long Term: Adherence to nutrition and physical activity/exercise program aimed toward attainment of established weight goal;Weight Maintenance: Understanding of the daily nutrition guidelines, which includes 25-35% calories from fat, 7% or less cal from saturated fats, less than '200mg'$  cholesterol, less than 1.5gm of sodium, & 5 or more servings of fruits and vegetables daily;Weight Loss: Understanding of general recommendations for a balanced deficit meal plan, which promotes 1-2 lb weight loss per week and includes a negative energy balance of [phone] kcal/d;Understanding recommendations for meals to include 15-35% energy as protein, 25-35% energy from fat, 35-60% energy from carbohydrates, less than '200mg'$  of dietary cholesterol, 20-35 gm of total fiber daily;Understanding of distribution of calorie intake throughout the day with the consumption of 4-5 meals/snacks    Improve shortness of breath with ADL's Yes    Intervention Provide education, individualized exercise plan and daily activity instruction to help decrease symptoms of SOB with activities of daily living.    Expected Outcomes Short Term: Improve cardiorespiratory fitness to achieve a reduction of symptoms when performing ADLs;Long Term: Be able to perform more ADLs without symptoms or delay the onset of symptoms    Diabetes Yes    Intervention Provide education about signs/symptoms and action to take for hypo/hyperglycemia.;Provide education about proper nutrition, including hydration, and aerobic/resistive exercise prescription along with prescribed medications to achieve blood glucose in normal ranges: Fasting glucose 65-99 mg/dL    Expected  Outcomes Short Term: Participant verbalizes understanding of the signs/symptoms and immediate care of hyper/hypoglycemia, proper foot care and importance of medication, aerobic/resistive exercise and nutrition plan for blood glucose control.;Long Term: Attainment of HbA1C < 7%.    Hypertension Yes    Intervention Provide education on lifestyle modifcations including regular physical activity/exercise, weight management, moderate sodium restriction and increased consumption of fresh fruit, vegetables, and low fat dairy, alcohol moderation, and smoking cessation.;Monitor prescription use compliance.    Expected Outcomes Short Term: Continued assessment and intervention until BP is < 140/6m HG in hypertensive participants. < 130/848mHG in hypertensive participants with diabetes, heart failure or chronic kidney disease.;Long Term: Maintenance of blood pressure at goal levels.    Lipids Yes    Intervention Provide education and support for participant on nutrition & aerobic/resistive exercise along with prescribed medications to achieve LDL '70mg'$ , HDL >'40mg'$ .    Expected Outcomes Long Term: Cholesterol controlled with medications as prescribed, with individualized exercise RX and with personalized nutrition plan. Value goals: LDL < '70mg'$ , HDL > 40 mg.;Short Term: Participant states understanding of desired cholesterol values and is compliant with medications prescribed. Participant is following exercise prescription and nutrition guidelines.             Core Components/Risk Factors/Patient Goals Review:   Goals and Risk Factor Review     Row Name 11/06/21 [id number] 1343 01/01/22 1144         Core Components/Risk Factors/Patient Goals Review   Personal Goals Review Weight Management/Obesity;Improve shortness of breath with ADL's;Diabetes;Lipids;Hypertension;Other Weight Management/Obesity;Improve shortness of breath with ADL's;Diabetes;Lipids;Hypertension;Other Weight Management/Obesity;Improve  shortness of breath with ADL's;Diabetes;Lipids;Hypertension;Other  Review Patient was referred to CR with TAVR. He has multiple risk factors for CAD and is participating in the program for risk modification. He has completed 4 sessions. His current weight is 232.6 down 1.4 lbs from his initial visit. His most recent A1C was 09/13/21 at 7.8%. He is on Metformin, Farxiga, Glipizide and Levemir for DM control. His blood pressure has at goal. We will continue to monitor. His personal goals for the program are to lose weight and improve his energy. We will continue to monitor his progress as he works towards meeting these goals. Patient completed 14 sessions. His current weight is 228.0 down 4.6 lbs from his initial visit. His most recent A1C was 09/13/21 at 7.8%. He is on Metformin, Farxiga, Glipizide and Levemir for DM control. He is doing well in the program with consistent attendance and progressions. His blood pressure continues to be at goal. His personal goals for the program continue to be to lose weight and improve his energy. We will continue to monitor his progress as he works towards meeting these goals. Patient completed 25 sessions. His current weight is 224.8 down 3.2 lbs from last 30 day review. His most recent A1C was 09/13/21 at 7.8%. He is on Metformin, Farxiga, Glipizide and Levemir for DM control. He continues to do well in the program with consistent attendance and progressions. His blood pressure continues to be at goal. He saw a nephrologist 12/11 and was treated for chronic anemia with an Iron infusion. His personal goals for the program continue to be to lose weight and improve his energy. We will continue to monitor his progress as he works towards meeting these goals.     Expected Outcomes Patient will complete the program meeting both personal and program goals. Patient will complete the program meeting both personal and program goals. Patient will complete the program meeting both personal  and program goals.              Core Components/Risk Factors/Patient Goals at Discharge (Final Review):   Goals and Risk Factor Review - 01/01/22 1144       Core Components/Risk Factors/Patient Goals Review   Personal Goals Review Weight Management/Obesity;Improve shortness of breath with ADL's;Diabetes;Lipids;Hypertension;Other    Review Patient completed 25 sessions. His current weight is 224.8 down 3.2 lbs from last 30 day review. His most recent A1C was 09/13/21 at 7.8%. He is on Metformin, Farxiga, Glipizide and Levemir for DM control. He continues to do well in the program with consistent attendance and progressions. His blood pressure continues to be at goal. He saw a nephrologist 12/11 and was treated for chronic anemia with an Iron infusion. His personal goals for the program continue to be to lose weight and improve his energy. We will continue to monitor his progress as he works towards meeting these goals.    Expected Outcomes Patient will complete the program meeting both personal and program goals.             ITP Comments:   Comments: ITP REVIEW Pt is making expected progress toward Cardiac Rehab goals after completing 27 sessions. Recommend continued exercise, life style modification, education, and increased stamina and strength.

## 2022-01-10 ENCOUNTER — Encounter (HOSPITAL_COMMUNITY)
Admission: RE | Admit: 2022-01-10 | Discharge: 2022-01-10 | Disposition: A | Discharge status: Home / Self Care | Payer: PPO | Source: Ambulatory Visit | Attending: Cardiovascular Disease | Admitting: Cardiovascular Disease

## 2022-01-10 DIAGNOSIS — Z952 Presence of prosthetic heart valve: Secondary | ICD-10-CM

## 2022-01-10 NOTE — Progress Notes (Signed)
Daily Session Note  Patient Details  Name: William [name] MRN: [id number] Date of Birth: March 22, 1945 Referring Provider:   Flowsheet Row CARDIAC REHAB PHASE II ORIENTATION from 10/24/2021 in [place]  Referring Provider Dr. Angelena Form       Encounter Date: 01/10/2022  Check In:  Session Check In - 01/10/22 0815       Check-In   Supervising physician immediately available to respond to emergencies [facility] MD immediately available    Physician(s) Dr. Domenic Polite    Location [facility]    Staff Present Leana Roe, BS, Exercise Physiologist;Dalton Sherrie George, MS, ACSM-CEP;Geanie Cooley, RN;Debra Johnson, RN, BSN    Virtual Visit No    Medication changes reported     No    Fall or balance concerns reported    Yes    Comments He has not fallen but he does lose his balance often. His arthritis in his knees also causes his knees to feel weak at times.    Tobacco Cessation No Change    Warm-up and Cool-down Performed as group-led instruction    Resistance Training Performed Yes    VAD Patient? No    PAD/SET Patient? No      Pain Assessment   Currently in Pain? No/denies    Pain Score 0-No pain    Multiple Pain Sites No             Capillary Blood Glucose: No results found for this or any previous visit (from the past 24 hour(s)).    Social History   Tobacco Use  Smoking Status Former   Packs/day: 3.00   Years: 45.00   Total pack years: 135.00   Types: Cigarettes   Start date: 01/06/1962   Quit date: 10/24/2004   Years since quitting: 17.2  Smokeless Tobacco Former   Quit date: 08/10/2005    Goals Met:  Independence with exercise equipment Exercise tolerated well No report of concerns or symptoms today Strength training completed today  Goals Unmet:  Not Applicable  Comments: check out @ 9:15am   Dr. Carlyle Dolly is Medical Director for [facility]

## 2022-01-13 ENCOUNTER — Encounter (HOSPITAL_COMMUNITY)
Admission: RE | Admit: 2022-01-13 | Discharge: 2022-01-13 | Disposition: A | Discharge status: Home / Self Care | Payer: PPO | Source: Ambulatory Visit | Attending: Cardiovascular Disease | Admitting: Cardiovascular Disease

## 2022-01-13 DIAGNOSIS — Z952 Presence of prosthetic heart valve: Secondary | ICD-10-CM | POA: Diagnosis not present

## 2022-01-13 NOTE — Progress Notes (Signed)
Daily Session Note  Patient Details  Name: William [name] MRN: [id number] Date of Birth: 06-May-1945 Referring Provider:   Flowsheet Row CARDIAC REHAB PHASE II ORIENTATION from 10/24/2021 in [place]  Referring Provider Dr. Angelena Form       Encounter Date: 01/13/2022  Check In:  Session Check In - 01/13/22 0815       Check-In   Supervising physician immediately available to respond to emergencies [facility] MD immediately available    Physician(s) Dr. Dellia Cloud    Location [facility]    Staff Present Hoy Register MHA, MS, ACSM-CEP;Madelyn Flavors, RN, BSN;Heather Mel Almond, BS, Exercise Physiologist    Virtual Visit No    Medication changes reported     No    Fall or balance concerns reported    Yes    Comments He has not fallen but he does lose his balance often. His arthritis in his knees also causes his knees to feel weak at times.    Tobacco Cessation No Change    Warm-up and Cool-down Performed as group-led instruction    Resistance Training Performed Yes    VAD Patient? No    PAD/SET Patient? No      Pain Assessment   Currently in Pain? No/denies    Pain Score 0-No pain    Multiple Pain Sites No             Capillary Blood Glucose: No results found for this or any previous visit (from the past 24 hour(s)).    Social History   Tobacco Use  Smoking Status Former   Packs/day: 3.00   Years: 45.00   Total pack years: 135.00   Types: Cigarettes   Start date: 01/06/1962   Quit date: 10/24/2004   Years since quitting: 17.2  Smokeless Tobacco Former   Quit date: 08/10/2005    Goals Met:  Independence with exercise equipment Exercise tolerated well No report of concerns or symptoms today Strength training completed today  Goals Unmet:  Not Applicable  Comments: checkout time is 0930   Dr. Carlyle Dolly is Medical Director for [facility]

## 2022-01-15 ENCOUNTER — Encounter (HOSPITAL_COMMUNITY)
Admission: RE | Admit: 2022-01-15 | Discharge: 2022-01-15 | Disposition: A | Discharge status: Home / Self Care | Payer: PPO | Source: Ambulatory Visit | Attending: Cardiovascular Disease | Admitting: Cardiovascular Disease

## 2022-01-15 DIAGNOSIS — Z952 Presence of prosthetic heart valve: Secondary | ICD-10-CM | POA: Diagnosis not present

## 2022-01-15 NOTE — Progress Notes (Signed)
Daily Session Note  Patient Details  Name: DAHLTON HINDE MRN: [id number] Date of Birth: 08/14/1945 Referring Provider:   Flowsheet Row CARDIAC REHAB PHASE II ORIENTATION from 10/24/2021 in [facility]  Referring Provider Dr. Angelena Form       Encounter Date: 01/15/2022  Check In:  Session Check In - 01/15/22 0815       Check-In   Supervising physician immediately available to respond to emergencies [facility] MD immediately available    Physician(s) Dr. Dellia Cloud    Location [facility]    Staff Present Hoy Register MHA, MS, ACSM-CEP;[employer] BSN, RN;Heather Mel [place], [place], Exercise Physiologist    Virtual Visit No    Medication changes reported     No    Fall or balance concerns reported    Yes    Comments He has not fallen but he does lose his balance often. His arthritis in his knees also causes his knees to feel weak at times.    Tobacco Cessation No Change    Warm-up and Cool-down Performed as group-led instruction    Resistance Training Performed Yes    VAD Patient? No    PAD/SET Patient? No      Pain Assessment   Currently in Pain? No/denies    Pain Score 0-No pain    Multiple Pain Sites No             Capillary Blood Glucose: No results found for this or any previous visit (from the past 24 hour(s)).    Social History   Tobacco Use  Smoking Status Former   Packs/day: 3.00   Years: 45.00   Total pack years: 135.00   Types: Cigarettes   Start date: 01/06/1962   Quit date: 10/24/2004   Years since quitting: 17.2  Smokeless Tobacco Former   Quit date: 08/10/2005    Goals Met:  Independence with exercise equipment Exercise tolerated well No report of concerns or symptoms today Strength training completed today  Goals Unmet:  Not Applicable  Comments: checkout time is 0915   Dr. Carlyle Dolly is Medical Director for [place]

## 2022-01-17 ENCOUNTER — Encounter (HOSPITAL_COMMUNITY): Payer: PPO

## 2022-01-20 ENCOUNTER — Encounter (HOSPITAL_COMMUNITY)
Admission: RE | Admit: 2022-01-20 | Discharge: 2022-01-20 | Disposition: A | Discharge status: Home / Self Care | Payer: PPO | Source: Ambulatory Visit | Attending: Cardiovascular Disease | Admitting: Cardiovascular Disease

## 2022-01-20 VITALS — Wt 220.9 lb

## 2022-01-20 DIAGNOSIS — Z952 Presence of prosthetic heart valve: Secondary | ICD-10-CM

## 2022-01-20 NOTE — Progress Notes (Signed)
Daily Session Note  Patient Details  Name: William [name] MRN: [id number] Date of Birth: 1945/07/29 Referring Provider:   Flowsheet Row CARDIAC REHAB PHASE II ORIENTATION from 10/24/2021 in [facility]  Referring Provider Dr. Angelena Form       Encounter Date: 01/20/2022  Check In:  Session Check In - 01/20/22 0815       Check-In   Supervising physician immediately available to respond to emergencies [facility] MD immediately available    Physician(s) Dr. Dellia Cloud    Location [facility]    Staff Present Leana Roe, BS, Exercise Physiologist;Daphyne Hassell Done, RN, BSN    Virtual Visit No    Medication changes reported     No    Fall or balance concerns reported    Yes    Comments He has not fallen but he does lose his balance often. His arthritis in his knees also causes his knees to feel weak at times.    Tobacco Cessation No Change    Warm-up and Cool-down Performed as group-led instruction    Resistance Training Performed Yes    VAD Patient? No    PAD/SET Patient? No      Pain Assessment   Currently in Pain? No/denies    Pain Score 0-No pain    Multiple Pain Sites No             Capillary Blood Glucose: No results found for this or any previous visit (from the past 24 hour(s)).    Social History   Tobacco Use  Smoking Status Former   Packs/day: 3.00   Years: 45.00   Total pack years: 135.00   Types: Cigarettes   Start date: 01/06/1962   Quit date: 10/24/2004   Years since quitting: 17.2  Smokeless Tobacco Former   Quit date: 08/10/2005    Goals Met:  Independence with exercise equipment Exercise tolerated well No report of concerns or symptoms today Strength training completed today  Goals Unmet:  Not Applicable  Comments: check out 0915   Dr. Carlyle Dolly is Medical Director for [facility]

## 2022-01-22 ENCOUNTER — Encounter (HOSPITAL_COMMUNITY): Payer: PPO

## 2022-01-24 ENCOUNTER — Encounter (HOSPITAL_COMMUNITY)
Admission: RE | Admit: 2022-01-24 | Discharge: 2022-01-24 | Disposition: A | Discharge status: Home / Self Care | Payer: PPO | Source: Ambulatory Visit | Attending: Cardiovascular Disease | Admitting: Cardiovascular Disease

## 2022-01-24 DIAGNOSIS — Z952 Presence of prosthetic heart valve: Secondary | ICD-10-CM | POA: Diagnosis not present

## 2022-01-24 NOTE — Progress Notes (Signed)
Daily Session Note  Patient Details  Name: William [name] MRN: [id number] Date of Birth: [birth year] Referring Provider:   Flowsheet Row CARDIAC REHAB PHASE II ORIENTATION from 10/24/2021 in [facility]  Referring Provider Dr. Angelena Form       Encounter Date: 01/24/2022  Check In:  Session Check In - 01/24/22 0815       Check-In   Supervising physician immediately available to respond to emergencies [facility] MD immediately available    Physician(s) Dr. Harl Bowie    Location [facility]    Staff Present Hoy Register MHA, MS, ACSM-CEP;Leana Roe, BS, Exercise Physiologist;Daphyne Hassell Done, RN, BSN    Virtual Visit No    Medication changes reported     No    Fall or balance concerns reported    Yes    Comments He has not fallen but he does lose his balance often. His arthritis in his knees also causes his knees to feel weak at times.    Tobacco Cessation No Change    Warm-up and Cool-down Performed as group-led instruction    Resistance Training Performed Yes    VAD Patient? No    PAD/SET Patient? No      Pain Assessment   Currently in Pain? No/denies    Pain Score 0-No pain    Multiple Pain Sites No             Capillary Blood Glucose: No results found for this or any previous visit (from the past 24 hour(s)).    Social History   Tobacco Use  Smoking Status Former   Packs/day: 3.00   Years: 45.00   Total pack years: 135.00   Types: Cigarettes   Start date: 01/06/1962   Quit date: 10/24/2004   Years since quitting: 17.2  Smokeless Tobacco Former   Quit date: 08/10/2005    Goals Met:  Independence with exercise equipment Exercise tolerated well No report of concerns or symptoms today Strength training completed today  Goals Unmet:  Not Applicable  Comments: checkout time is 0915   Dr. Carlyle Dolly is Medical Director for [facility]

## 2022-01-27 ENCOUNTER — Encounter (HOSPITAL_COMMUNITY)
Admission: RE | Admit: 2022-01-27 | Discharge: 2022-01-27 | Disposition: A | Discharge status: Home / Self Care | Payer: PPO | Source: Ambulatory Visit | Attending: Cardiovascular Disease | Admitting: Cardiovascular Disease

## 2022-01-27 VITALS — Ht 69.0 in | Wt 224.0 lb

## 2022-01-27 DIAGNOSIS — Z952 Presence of prosthetic heart valve: Secondary | ICD-10-CM

## 2022-01-27 NOTE — Progress Notes (Signed)
Daily Session Note  Patient Details  Name: William [name] MRN: [id number] Date of Birth: [birth year] Referring Provider:   Flowsheet Row CARDIAC REHAB PHASE II ORIENTATION from 10/24/2021 in [facility]  Referring Provider Dr. Angelena Form       Encounter Date: 01/27/2022  Check In:  Session Check In - 01/27/22 0815       Check-In   Supervising physician immediately available to respond to emergencies [facility] immediately available    Physician(s) Dr. Harl Bowie    Location [facility]    Staff Present Hoy Register MHA, MS, ACSM-CEP;Leana Roe, BS, Exercise Physiologist;Daphyne Hassell Done, RN, BSN    Virtual Visit No    Medication changes reported     No    Fall or balance concerns reported    Yes    Comments He has not fallen but he does lose his balance often. His arthritis in his knees also causes his knees to feel weak at times.    Tobacco Cessation No Change    Warm-up and Cool-down Performed as group-led instruction    Resistance Training Performed Yes    VAD Patient? No    PAD/SET Patient? No      Pain Assessment   Currently in Pain? No/denies    Pain Score 0-No pain    Multiple Pain Sites No             Capillary Blood Glucose: No results found for this or any previous visit (from the past 24 hour(s)).    Social History   Tobacco Use  Smoking Status Former   Packs/day: 3.00   Years: 45.00   Total pack years: 135.00   Types: Cigarettes   Start date: 01/06/1962   Quit date: 10/24/2004   Years since quitting: 17.2  Smokeless Tobacco Former   Quit date: 08/10/2005    Goals Met:  Independence with exercise equipment Exercise tolerated well No report of concerns or symptoms today Strength training completed today  Goals Unmet:  Not Applicable  Comments: checkout time is 0915   Dr. Carlyle Dolly is Medical Director for [place] [facility]

## 2022-01-29 NOTE — Progress Notes (Signed)
CARDIOLOGY CONSULT NOTE       Patient ID: William [name] MRN: [id number] DOB/AGE: 01-08-1945 77 y.o.  Primary Physician: [name], MD Primary Cardiologist: Johnsie Cancel   HPI:  77 y.o. first seen by me 01/04/21 for aortic stenosis. History of HTN, CAD with DES to RCA in 2015. TTE 06/19/21 progression to severe AS with mean gradient 41 peak 66 mmHg AVA 0.62. 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  Still gardens  Has 4 kids and a grand daughter locally that look in on him   Still complaining of moderate dyspnea With LE edema and some leg pain  CTA 10/24/21 showed thrombosed left pseudoaneurysm from access site Also noted progressive ventral hernia with no inflammation /obstruction   TTE 10/22/21 stable with normal EF 60% mean gradient TAVR 15 mmHg no PVL MAC with mild/mod MR and mild MS mean gradient only 4 mmHg  BNP was only 180 suggesting dyspnea not currently cardiac   No cardiac complaints   ROS All other systems reviewed and negative except as noted above  Past Medical History:  Diagnosis Date   Anxiety    Aortic stenosis    Arthritis    CAD (coronary artery disease)    a. s/p DES to distal RCA in 08/2013, DES to LAD 07/2021   Cancer [facility])    prostate   CKD (chronic kidney disease)    Diabetes mellitus without complication (HCC)    Family history of colon cancer    GERD (gastroesophageal reflux disease)    Heart murmur    Hypercalcemia     Hypercholesteremia    Hypertension    Kidney stone    PAF (paroxysmal atrial fibrillation) (HCC)    Personal history of colonic polyps    Pneumonia    S/P TAVR (transcatheter aortic valve replacement) 09/17/2021   s/p TAVR with a 23 mm Edwards S3UR via the TF approach by Dr. Angelena Form & Bartle    Family History  Problem Relation Age of Onset   Diabetes Mother    Heart attack Mother 3   Pulmonary embolism Father    Colon cancer Brother 6       Passed age 60 from colon ca   Gastric cancer Neg Hx    Esophageal cancer Neg Hx     Social History   Socioeconomic History   Marital status: Widowed    Spouse name: Not on file   Number of children: 5   Years of education: Not on file   Highest education level: Not on file  Occupational History   Occupation: Retired Archivist  Tobacco Use   Smoking status: Former    Packs/day: 3.00    Years: 45.00    Total pack years: 135.00    Types: Cigarettes    Start date: 01/06/1962    Quit date: 10/24/2004    Years since quitting: 17.3   Smokeless tobacco: Former    Quit date: 08/10/2005  Vaping Use   Vaping Use: Never used  Substance and Sexual Activity   Alcohol use: Not Currently    Comment: occasional   Drug use: No   Sexual activity: Not on file  Other Topics Concern   Not on file  Social History Narrative   Not on file   Social Determinants of Health   Financial Resource Strain: Not on file  Food Insecurity: Not on file  Transportation Needs: Not on file  Physical Activity: Not on file  Stress: Not on file  Social Connections: Not on file  Intimate Partner Violence: Not on file    Past Surgical History:  Procedure Laterality Date   APPENDECTOMY     BIOPSY  11/03/2019   Benign gastric mucosa with reactive changes and focal inflammation   BIOPSY  03/11/2021   Procedure: BIOPSY;  Surgeon: [name], DO;  [facility];  Service: Endoscopy;;   COLONOSCOPY WITH PROPOFOL N/A 02/15/2018   12  polyps ranging in 5 to 20 mm in size were tubular adenoma and recommended repeat exam in 2023   COLONOSCOPY WITH PROPOFOL N/A 03/11/2021   Procedure: COLONOSCOPY WITH PROPOFOL;  Surgeon: [name], DO;  [facility];  Service: Endoscopy;  Laterality: N/A;  9:15am   CORONARY STENT INTERVENTION N/A 07/15/2021   Procedure: CORONARY STENT INTERVENTION;  Surgeon: [name], MD;  Location: [facility];  Service: Cardiovascular;  Laterality: N/A;   CORONARY STENT PLACEMENT  08/10/2013   ESOPHAGOGASTRODUODENOSCOPY (EGD) WITH PROPOFOL N/A 11/03/2019   normal esophagus, small hiatal hernia, diffuse erythematous mucosa in the entire stomach with scattered erosions.  Status post gastric biopsies for histology.  Surgical pathology found the biopsies to be benign gastric mucosa with reactive changes and focal inflammation, negative for H. Pylori.   FLEXIBLE SIGMOIDOSCOPY N/A 11/03/2019   attempted colonoscopy but inadequate prep   gsw to abd     INTRAOPERATIVE TRANSTHORACIC ECHOCARDIOGRAM N/A 09/17/2021   Procedure: INTRAOPERATIVE TRANSTHORACIC ECHOCARDIOGRAM;  Surgeon: [name], MD;  Location: [facility];  Service: Open Heart Surgery;  Laterality: N/A;   INTRAVASCULAR LITHOTRIPSY  07/15/2021   Procedure: INTRAVASCULAR LITHOTRIPSY;  Surgeon: [name], MD;  Location: [facility];  Service: Cardiovascular;;   LEFT HEART CATHETERIZATION WITH CORONARY ANGIOGRAM N/A 08/10/2013   Procedure: LEFT HEART CATHETERIZATION WITH CORONARY ANGIOGRAM;  Surgeon: [name], MD;  Location: [facility];  Service: Cardiovascular;  Laterality: N/A;   MULTIPLE EXTRACTIONS WITH ALVEOLOPLASTY N/A 08/22/2021   Procedure: MULTIPLE EXTRACTION WITH ALVEOLOPLASTY;  Surgeon: [name], [name];  Location: [facility];  Service: Dentistry;  Laterality: N/A;   POLYPECTOMY  02/15/2018   Procedure: POLYPECTOMY;  Surgeon: [name], MD;  [facility];  Service: Endoscopy;;  colon   POLYPECTOMY  03/11/2021   Procedure: POLYPECTOMY;  Surgeon: [name], DO;  [facility];  Service: Endoscopy;;   RIGHT/LEFT HEART CATH AND CORONARY ANGIOGRAPHY N/A 07/15/2021   Procedure: RIGHT/LEFT HEART CATH AND CORONARY ANGIOGRAPHY;  Surgeon: [name], MD;  Location: [facility];  Service: Cardiovascular;  Laterality: N/A;   TRANSCATHETER AORTIC VALVE REPLACEMENT, TRANSFEMORAL N/A 09/17/2021   Procedure: Transcatheter Aortic Valve Replacement, Transfemoral;  Surgeon: [name], MD;  Location: [facility];  Service: Open Heart Surgery;  Laterality: N/A;      Current Outpatient Medications:    acarbose (PRECOSE) 100 MG tablet, Take 100 mg by mouth  3 (three) times daily with meals. , Disp: , Rfl:    ACCU-CHEK GUIDE test strip, , Disp: , Rfl:    Accu-Chek Softclix Lancets lancets, 1 each 3 (three) times daily., Disp: , Rfl:    Acetylcysteine (NAC 600) 600 MG CAPS, Take 1,200 mg by mouth in the morning., Disp: , Rfl:    alendronate (FOSAMAX) 70 MG tablet, Take 1 tablet (70 mg total) by mouth every 7 (seven) days. Take with a full glass of water on an empty stomach. (Patient taking differently: Take 70 mg by mouth every 7 (seven) days. Take with a full glass of water on an empty stomach. Taken [employer]), Disp: 4 tablet, Rfl: 11   amLODipine (NORVASC) 5 MG tablet, TAKE 1 TABLET(5 MG) BY MOUTH DAILY (Patient taking differently: Take 5 mg by mouth every evening.), Disp: 90 tablet, Rfl: 0   apixaban (ELIQUIS) 5 MG TABS tablet, TAKE 1 TABLET(5 MG) BY MOUTH TWICE DAILY, Disp: 60 tablet, Rfl: 5   Ascorbic Acid (VITAMIN C) 1000 MG tablet, Take 1,000 mg by mouth every morning., Disp: , Rfl:    aspirin EC 81 MG tablet, Take 81 mg by mouth in the morning. Swallow whole., Disp: , Rfl:    Cholecalciferol (VITAMIN D3) 125 MCG (5000 UT) TABS, Take 5,000 Units by mouth in the morning., Disp: , Rfl:     clopidogrel (PLAVIX) 75 MG tablet, Take 1 tablet (75 mg total) by mouth daily., Disp: 90 tablet, Rfl: 3   Coenzyme Q10 (COQ10) 100 MG CAPS, Take 100 mg by mouth in the morning., Disp: , Rfl:    FARXIGA 10 MG TABS tablet, Take 10 mg by mouth in the morning., Disp: , Rfl:    ferrous sulfate 325 (65 FE) MG EC tablet, Take 1 tablet (325 mg total) by mouth daily with breakfast., Disp: 30 tablet, Rfl: 3   furosemide (LASIX) 20 MG tablet, Take 20 mg by mouth 2 (two) times daily. , Disp: , Rfl: 0   glipiZIDE (GLUCOTROL) 10 MG tablet, Take 10 mg by mouth 2 (two) times daily., Disp: , Rfl:    Glucosamine-Chondroitin (MOVE FREE PO), Take 1 tablet by mouth in the morning and at bedtime., Disp: , Rfl:    HYDROcodone-acetaminophen (NORCO) 10-325 MG tablet, Take 1 tablet by mouth every 6 (six) hours as needed for moderate pain., Disp: , Rfl:    LEVEMIR FLEXTOUCH 100 UNIT/ML Pen, Inject 90 Units into the skin at bedtime. , Disp: , Rfl:    lisinopril (ZESTRIL) 20 MG tablet, Take 1 tablet (20 mg total) by mouth daily. (Patient taking differently: Take 20 mg by mouth every evening.), Disp: 90 tablet, Rfl: 1   Magnesium 400 MG TABS, Take 400 mg by mouth in the morning., Disp: , Rfl:    Menatetrenone (VITAMIN K2) 100 MCG TABS, Take 100 mcg by mouth in the morning., Disp: , Rfl:    metFORMIN (GLUCOPHAGE) 1000 MG tablet, Take 1,000 mg by mouth 2 (two) times daily., Disp: , Rfl: 3   metFORMIN (GLUCOPHAGE-XR) 500 MG 24 hr tablet, Take 500 mg by mouth every evening. Take with 1000 Metformin, Disp: , Rfl:    metoprolol tartrate (LOPRESSOR) 25 MG tablet, Take 1 tablet (25 mg total) by mouth 2 (two) times daily., Disp: 60 tablet, Rfl: 2   nitroGLYCERIN (NITROSTAT) 0.4 MG SL tablet, Place 1 tablet (0.4 mg total) under the tongue every 5 (five) minutes x 3 doses as needed for chest pain (if no relief after 2nd dose, proceed to  the ED for an evalution or call 911)., Disp: 75 tablet, Rfl: 2   pantoprazole (PROTONIX) 40 MG tablet,  Take 40 mg by mouth every evening. , Disp: , Rfl:    potassium citrate (UROCIT-K) 5 MEQ (540 MG) SR tablet, Take 5 mEq by mouth 3 (three) times daily with meals., Disp: , Rfl:    QUERCETIN PO, Take 1,000 mg by mouth in the morning., Disp: , Rfl:    rosuvastatin (CRESTOR) 20 MG tablet, Take 20 mg by mouth every evening. , Disp: , Rfl:    silodosin (RAPAFLO) 8 MG CAPS capsule, Take 1 capsule (8 mg total) by mouth daily with breakfast., Disp: 90 capsule, Rfl: 3   trolamine salicylate (BLUE-EMU HEMP) 10 % cream, Apply 1 application topically as needed for muscle pain., Disp: , Rfl:    vitamin B-12 (CYANOCOBALAMIN) 1000 MCG tablet, Take 1,000 mcg by mouth in the morning., Disp: , Rfl:    zinc gluconate 50 MG tablet, Take 50 mg by mouth in the morning., Disp: , Rfl:     Physical Exam: Blood pressure 128/78, pulse 74, height 5' 9"  (1.753 m), weight 224 lb 12.8 oz (102 kg), SpO2 97 %.    Affect appropriate Elderly male  HEENT: normal Neck supple with no adenopathy JVP normal no bruits no thyromegaly Lungs clear with no wheezing and good diaphragmatic motion Heart:  S1/S2 SEM through TAVR valve  murmur, no rub, gallop or click PMI normal Abdomen: benighn, BS positve, no tenderness, ventral hernia Distal pulses intact with no bruits Plus one  edema some varicose veins  Neuro non-focal Skin warm and dry No muscular weakness   Labs:   Lab Results  Component Value Date   WBC 10.7 11/15/2021   HGB 13.4 11/15/2021   HCT 41.8 11/15/2021   MCV 93 11/15/2021   PLT 182 11/15/2021   No results for input(s): "NA", "K", "CL", "CO2", "BUN", "CREATININE", "CALCIUM", "PROT", "BILITOT", "ALKPHOS", "ALT", "AST", "GLUCOSE" in the last 168 hours.  Invalid input(s): "LABALBU" Lab Results  Component Value Date   CKTOTAL 182 10/27/2007   CKMB 2.8 10/27/2007   TROPONINI <0.03 08/18/2015    Lab Results  Component Value Date   CHOL  10/27/2007    122        ATP III CLASSIFICATION:  <200     mg/dL    Desirable  200-239  mg/dL   Borderline High  >=240    mg/dL   High   Lab Results  Component Value Date   HDL 39 (L) 10/27/2007   Lab Results  Component Value Date   LDLCALC  10/27/2007    68        Total Cholesterol/HDL:CHD Risk Coronary Heart Disease Risk Table                     Men   Women  1/2 Average Risk   3.4   3.3   Lab Results  Component Value Date   TRIG 76 10/27/2007   Lab Results  Component Value Date   CHOLHDL 3.1 10/27/2007   No results found for: "LDLDIRECT"    Radiology: No results found.  EKG: Afib rate 118 LBBB 09/23/21    ASSESSMENT AND PLAN:   CAD: Old stent to distal RCA. Pre TAVR cath had new stent to mid LAD on plavix only due to need for anticoagulation stable TAVR:  23 mm Sapien 3 valve implant 09/17/21 post implant TTE 10/22/21 with mean gradient 15 no PVL  LBBB:  Zio with some Wenkebach no high grade AV block or bradycardia monitor closely as he is onlow dose beta blocker for his PAF PAF:  Low dose amiodarone 200 mg daily on low dose lopressor continue eliquis  HLD:  continue crestor DM:  Discussed low carb diet.  Target hemoglobin A1c is 6.5 or less.  Continue current medications. Thyroid nodule: noted on pre TAVR CTs. Nell Range discussed with pt and ordered a thyroid US.  Dyspnea:  etiology not clear EF normal TAVR valve ok MAC with some MV dx but stable. BNP normal observe  Ventral Hernia:  observe no pain/incarceration     F/U with me in 6 months  Signed: [name] 02/07/2022, 1:00 PM

## 2022-01-31 ENCOUNTER — Other Ambulatory Visit: Payer: Self-pay | Admitting: Home Health

## 2022-01-31 NOTE — Telephone Encounter (Signed)
Prescription refill request for Eliquis received. Indication:afib Last office visit:10/23 Scr:1.2 Age: 77 Weight:101.6  kg  Prescription refilled

## 2022-02-04 NOTE — Progress Notes (Signed)
Discharge Progress Report  Patient Details  Name: William [name] MRN: [id number] Date of Birth: [birth year] Referring Provider:   Flowsheet Row CARDIAC REHAB PHASE II ORIENTATION from 10/24/2021 in [place]  Referring Provider Dr. Angelena Form        Number of Visits: 34  Reason for Discharge:  Patient reached a stable level of exercise. Patient independent in their exercise. Patient has met program and personal goals.  Smoking History:  Social History   Tobacco Use  Smoking Status Former   Packs/day: 3.00   Years: 45.00   Total pack years: 135.00   Types: Cigarettes   Start date: 01/06/1962   Quit date: 10/24/2004   Years since quitting: 17.2  Smokeless Tobacco Former   Quit date: 08/10/2005    Diagnosis:  S/P TAVR (transcatheter aortic valve replacement)  ADL [facility]:   Initial Exercise Prescription:  Initial Exercise Prescription - 10/24/21 1000       Date of Initial Exercise RX and Referring Provider   Date 10/24/21    Referring Provider Dr. Angelena Form    Expected Discharge Date 01/17/22      NuStep   Level 1    SPM 60    Minutes 17      Arm Ergometer   Level 1    RPM 45    Minutes 22      Prescription Details   Frequency (times per week) 3    Duration Progress to 30 minutes of continuous aerobic without signs/symptoms of physical distress      Intensity   THRR 40-80% of Max Heartrate 58-116    Ratings of Perceived Exertion 11-13    Perceived Dyspnea 0-4      Resistance Training   Training Prescription Yes    Weight 3    Reps 10-15             Discharge Exercise Prescription (Final Exercise Prescription Changes):  Exercise Prescription Changes - 01/20/22 1200       Response to Exercise   Blood Pressure (Admit) 114/64    Blood Pressure (Exercise) 136/60    Blood Pressure (Exit) 124/62    Heart Rate (Admit) 83 bpm    Heart Rate (Exercise) 111 bpm    Heart Rate (Exit) 93 bpm    Rating of Perceived Exertion  (Exercise) 12    Duration Continue with 30 min of aerobic exercise without signs/symptoms of physical distress.    Intensity THRR unchanged      Progression   Progression Continue to progress workloads to maintain intensity without signs/symptoms of physical distress.      Resistance Training   Training Prescription Yes    Weight 5    Reps 10-15    Time 10 Minutes      NuStep   Level 4    SPM 106    Minutes 22    METs 2.61      Arm Ergometer   Level 3    RPM 51    Minutes 17    METs 3.82             Functional Capacity:  6 Minute Walk     Row Name 10/24/21 1003 01/27/22 0903 01/27/22 1409     6 Minute Walk   Phase Initial Discharge (P)  Discharge   Distance 1100 feet 1350 feet (P)  1350 feet   Walk Time 6 minutes 6 minutes (P)  6 minutes   # of Rest Breaks 0 0 (P)  0   MPH 2.03 2.55 (P)  2.55   METS 1.75 2.84 (P)  2.84   RPE 13 10 (P)  10   VO2 Peak 6.15 9.95 (P)  9.95   Symptoms No No (P)  No   Resting HR 66 bpm -- 72 bpm   Resting BP 116/54 -- 122/60   Resting Oxygen Saturation  97 % -- 97 %   Exercise Oxygen Saturation  during 6 min walk 98 % -- 98 %   Max Ex. HR 84 bpm -- 130 bpm   Max Ex. BP 136/54 -- 146/60   2 Minute Post BP 120/54 -- 126/60            Psychological, QOL, Others - Outcomes: PHQ 2/9:    10/24/2021    8:36 AM 09/27/2013   11:02 AM  Depression screen PHQ 2/9  Decreased Interest 1 0  Down, Depressed, Hopeless 0 0  PHQ - 2 Score 1 0  Altered sleeping 0   Tired, decreased energy 2   Change in appetite 0   Feeling bad or failure about yourself  0   Trouble concentrating 0   Moving slowly or fidgety/restless 0   Suicidal thoughts 0   PHQ-9 Score 3   Difficult doing work/chores Somewhat difficult     Quality of Life:  Quality of Life - 01/27/22 1412       Quality of Life   Select Quality of Life      Quality of Life Scores   Health/Function Pre 20.04 %    Health/Function Post 20.17 %    Health/Function % Change  0.65 %    Socioeconomic Pre 23.93 %    Socioeconomic Post 21.64 %    Socioeconomic % Change  -9.57 %    Psych/Spiritual Pre 18.93 %    Psych/Spiritual Post 22.71 %    Psych/Spiritual % Change 19.97 %    Family Pre 22.5 %    Family Post 28.5 %    Family % Change 26.67 %    GLOBAL Pre 21 %    GLOBAL Post 22.03 %    GLOBAL % Change 4.9 %             Personal Goals: Goals established at orientation with interventions provided to work toward goal.  Personal Goals and Risk Factors at Admission - 10/24/21 0909       Core Components/Risk Factors/Patient Goals on Admission    Weight Management Yes;Obesity;Weight Loss    Intervention Weight Management: Develop a combined nutrition and exercise program designed to reach desired caloric intake, while maintaining appropriate intake of nutrient and fiber, sodium and fats, and appropriate energy expenditure required for the weight goal.;Weight Management: Provide education and appropriate resources to help participant work on and attain dietary goals.;Weight Management/Obesity: Establish reasonable short term and long term weight goals.;Obesity: Provide education and appropriate resources to help participant work on and attain dietary goals.    Admit Weight 234 lb (106.1 kg)    Goal Weight: Short Term 220 lb (99.8 kg)    Expected Outcomes Short Term: Continue to assess and modify interventions until short term weight is achieved;Long Term: Adherence to nutrition and physical activity/exercise program aimed toward attainment of established weight goal;Weight Maintenance: Understanding of the daily nutrition guidelines, which includes 25-35% calories from fat, 7% or less cal from saturated fats, less than '200mg'$  cholesterol, less than 1.5gm of sodium, & 5 or more servings of fruits and vegetables daily;Weight Loss: Understanding of general  recommendations for a balanced deficit meal plan, which promotes 1-2 lb weight loss per week and includes a negative  energy balance of [phone] kcal/d;Understanding recommendations for meals to include 15-35% energy as protein, 25-35% energy from fat, 35-60% energy from carbohydrates, less than '200mg'$  of dietary cholesterol, 20-35 gm of total fiber daily;Understanding of distribution of calorie intake throughout the day with the consumption of 4-5 meals/snacks    Improve shortness of breath with ADL's Yes    Intervention Provide education, individualized exercise plan and daily activity instruction to help decrease symptoms of SOB with activities of daily living.    Expected Outcomes Short Term: Improve cardiorespiratory fitness to achieve a reduction of symptoms when performing ADLs;Long Term: Be able to perform more ADLs without symptoms or delay the onset of symptoms    Diabetes Yes    Intervention Provide education about signs/symptoms and action to take for hypo/hyperglycemia.;Provide education about proper nutrition, including hydration, and aerobic/resistive exercise prescription along with prescribed medications to achieve blood glucose in normal ranges: Fasting glucose 65-99 mg/dL    Expected Outcomes Short Term: Participant verbalizes understanding of the signs/symptoms and immediate care of hyper/hypoglycemia, proper foot care and importance of medication, aerobic/resistive exercise and nutrition plan for blood glucose control.;Long Term: Attainment of HbA1C < 7%.    Hypertension Yes    Intervention Provide education on lifestyle modifcations including regular physical activity/exercise, weight management, moderate sodium restriction and increased consumption of fresh fruit, vegetables, and low fat dairy, alcohol moderation, and smoking cessation.;Monitor prescription use compliance.    Expected Outcomes Short Term: Continued assessment and intervention until BP is < 140/6m HG in hypertensive participants. < 130/872mHG in hypertensive participants with diabetes, heart failure or chronic kidney disease.;Long  Term: Maintenance of blood pressure at goal levels.    Lipids Yes    Intervention Provide education and support for participant on nutrition & aerobic/resistive exercise along with prescribed medications to achieve LDL '70mg'$ , HDL >'40mg'$ .    Expected Outcomes Long Term: Cholesterol controlled with medications as prescribed, with individualized exercise RX and with personalized nutrition plan. Value goals: LDL < '70mg'$ , HDL > 40 mg.;Short Term: Participant states understanding of desired cholesterol values and is compliant with medications prescribed. Participant is following exercise prescription and nutrition guidelines.              Personal Goals Discharge:  Goals and Risk Factor Review     Row Name 11/06/21 [id number] 1343 01/01/22 1144 02/04/22 1011       Core Components/Risk Factors/Patient Goals Review   Personal Goals Review Weight Management/Obesity;Improve shortness of breath with ADL's;Diabetes;Lipids;Hypertension;Other Weight Management/Obesity;Improve shortness of breath with ADL's;Diabetes;Lipids;Hypertension;Other Weight Management/Obesity;Improve shortness of breath with ADL's;Diabetes;Lipids;Hypertension;Other Weight Management/Obesity;Improve shortness of breath with ADL's;Diabetes;Lipids;Hypertension;Other    Review Patient was referred to CR with TAVR. He has multiple risk factors for CAD and is participating in the program for risk modification. He has completed 4 sessions. His current weight is 232.6 down 1.4 lbs from his initial visit. His most recent A1C was 09/13/21 at 7.8%. He is on Metformin, Farxiga, Glipizide and Levemir for DM control. His blood pressure has at goal. We will continue to monitor. His personal goals for the program are to lose weight and improve his energy. We will continue to monitor his progress as he works towards meeting these goals. Patient completed 14 sessions. His current weight is 228.0 down 4.6 lbs from his initial visit. His most recent A1C was  09/13/21 at 7.8%. He is on Metformin, FaWilder Glade  Glipizide and Levemir for DM control. He is doing well in the program with consistent attendance and progressions. His blood pressure continues to be at goal. His personal goals for the program continue to be to lose weight and improve his energy. We will continue to monitor his progress as he works towards meeting these goals. Patient completed 25 sessions. His current weight is 224.8 down 3.2 lbs from last 30 day review. His most recent A1C was 09/13/21 at 7.8%. He is on Metformin, Farxiga, Glipizide and Levemir for DM control. He continues to do well in the program with consistent attendance and progressions. His blood pressure continues to be at goal. He saw a nephrologist 12/11 and was treated for chronic anemia with an Iron infusion. His personal goals for the program continue to be to lose weight and improve his energy. We will continue to monitor his progress as he works towards meeting these goals. Pt graduated from CR after 34 sessions. He was able to lose 9.5 lbs while he was in the program. This was due to his increased activity level and his dietary improvements, he decreased his MEDFICTs score by 18 points. His DM is still managed with Metformin, Farxiga, Glipizide, and Levemir. He did not have an A1C taken while he was in the program. His vitals were WNLs while he was in the program. He reports that he was able to improve his energy level, and he was able to consistently improve his workloads.    Expected Outcomes Patient will complete the program meeting both personal and program goals. Patient will complete the program meeting both personal and program goals. Patient will complete the program meeting both personal and program goals. Pt will continue to work towards their goals post discharge.             Exercise Goals and Review:  Exercise Goals     Row Name 10/24/21 1008 11/11/21 1321 12/09/21 1307 01/07/22 1117       Exercise Goals    Increase Physical Activity Yes Yes Yes Yes    Intervention Provide advice, education, support and counseling about physical activity/exercise needs.;Develop an individualized exercise prescription for aerobic and resistive training based on initial evaluation findings, risk stratification, comorbidities and participant's personal goals. Provide advice, education, support and counseling about physical activity/exercise needs.;Develop an individualized exercise prescription for aerobic and resistive training based on initial evaluation findings, risk stratification, comorbidities and participant's personal goals. Provide advice, education, support and counseling about physical activity/exercise needs.;Develop an individualized exercise prescription for aerobic and resistive training based on initial evaluation findings, risk stratification, comorbidities and participant's personal goals. Provide advice, education, support and counseling about physical activity/exercise needs.;Develop an individualized exercise prescription for aerobic and resistive training based on initial evaluation findings, risk stratification, comorbidities and participant's personal goals.    Expected Outcomes Short Term: Attend rehab on a regular basis to increase amount of physical activity.;Long Term: Add in home exercise to make exercise part of routine and to increase amount of physical activity.;Long Term: Exercising regularly at least 3-5 days a week. Short Term: Attend rehab on a regular basis to increase amount of physical activity.;Long Term: Add in home exercise to make exercise part of routine and to increase amount of physical activity.;Long Term: Exercising regularly at least 3-5 days a week. Short Term: Attend rehab on a regular basis to increase amount of physical activity.;Long Term: Add in home exercise to make exercise part of routine and to increase amount of physical activity.;Long Term: Exercising  regularly at least 3-5  days a week. Short Term: Attend rehab on a regular basis to increase amount of physical activity.;Long Term: Add in home exercise to make exercise part of routine and to increase amount of physical activity.;Long Term: Exercising regularly at least 3-5 days a week.    Increase Strength and Stamina Yes Yes Yes Yes    Intervention Provide advice, education, support and counseling about physical activity/exercise needs.;Develop an individualized exercise prescription for aerobic and resistive training based on initial evaluation findings, risk stratification, comorbidities and participant's personal goals. Provide advice, education, support and counseling about physical activity/exercise needs.;Develop an individualized exercise prescription for aerobic and resistive training based on initial evaluation findings, risk stratification, comorbidities and participant's personal goals. Provide advice, education, support and counseling about physical activity/exercise needs.;Develop an individualized exercise prescription for aerobic and resistive training based on initial evaluation findings, risk stratification, comorbidities and participant's personal goals. Provide advice, education, support and counseling about physical activity/exercise needs.;Develop an individualized exercise prescription for aerobic and resistive training based on initial evaluation findings, risk stratification, comorbidities and participant's personal goals.    Expected Outcomes Short Term: Increase workloads from initial exercise prescription for resistance, speed, and METs.;Short Term: Perform resistance training exercises routinely during rehab and add in resistance training at home;Long Term: Improve cardiorespiratory fitness, muscular endurance and strength as measured by increased METs and functional capacity (6MWT) Short Term: Increase workloads from initial exercise prescription for resistance, speed, and METs.;Short Term: Perform  resistance training exercises routinely during rehab and add in resistance training at home;Long Term: Improve cardiorespiratory fitness, muscular endurance and strength as measured by increased METs and functional capacity (6MWT) Short Term: Increase workloads from initial exercise prescription for resistance, speed, and METs.;Short Term: Perform resistance training exercises routinely during rehab and add in resistance training at home;Long Term: Improve cardiorespiratory fitness, muscular endurance and strength as measured by increased METs and functional capacity (6MWT) Short Term: Increase workloads from initial exercise prescription for resistance, speed, and METs.;Short Term: Perform resistance training exercises routinely during rehab and add in resistance training at home;Long Term: Improve cardiorespiratory fitness, muscular endurance and strength as measured by increased METs and functional capacity (6MWT)    Able to understand and use rate of perceived exertion (RPE) scale Yes Yes Yes Yes    Intervention Provide education and explanation on how to use RPE scale Provide education and explanation on how to use RPE scale Provide education and explanation on how to use RPE scale Provide education and explanation on how to use RPE scale    Expected Outcomes Short Term: Able to use RPE daily in rehab to express subjective intensity level;Long Term:  Able to use RPE to guide intensity level when exercising independently Short Term: Able to use RPE daily in rehab to express subjective intensity level;Long Term:  Able to use RPE to guide intensity level when exercising independently Short Term: Able to use RPE daily in rehab to express subjective intensity level;Long Term:  Able to use RPE to guide intensity level when exercising independently Short Term: Able to use RPE daily in rehab to express subjective intensity level;Long Term:  Able to use RPE to guide intensity level when exercising independently     Knowledge and understanding of Target Heart Rate Range (THRR) Yes Yes Yes Yes    Intervention Provide education and explanation of THRR including how the numbers were predicted and where they are located for reference Provide education and explanation of THRR including how the numbers were  predicted and where they are located for reference Provide education and explanation of THRR including how the numbers were predicted and where they are located for reference Provide education and explanation of THRR including how the numbers were predicted and where they are located for reference    Expected Outcomes Short Term: Able to state/look up THRR;Short Term: Able to use daily as guideline for intensity in rehab;Long Term: Able to use THRR to govern intensity when exercising independently Short Term: Able to state/look up THRR;Short Term: Able to use daily as guideline for intensity in rehab;Long Term: Able to use THRR to govern intensity when exercising independently Short Term: Able to state/look up THRR;Short Term: Able to use daily as guideline for intensity in rehab;Long Term: Able to use THRR to govern intensity when exercising independently Short Term: Able to state/look up THRR;Short Term: Able to use daily as guideline for intensity in rehab;Long Term: Able to use THRR to govern intensity when exercising independently    Able to check pulse independently Yes Yes Yes Yes    Intervention Provide education and demonstration on how to check pulse in carotid and radial arteries.;Review the importance of being able to check your own pulse for safety during independent exercise Provide education and demonstration on how to check pulse in carotid and radial arteries.;Review the importance of being able to check your own pulse for safety during independent exercise Provide education and demonstration on how to check pulse in carotid and radial arteries.;Review the importance of being able to check your own pulse for  safety during independent exercise Provide education and demonstration on how to check pulse in carotid and radial arteries.;Review the importance of being able to check your own pulse for safety during independent exercise    Expected Outcomes Short Term: Able to explain why pulse checking is important during independent exercise;Long Term: Able to check pulse independently and accurately Short Term: Able to explain why pulse checking is important during independent exercise;Long Term: Able to check pulse independently and accurately Short Term: Able to explain why pulse checking is important during independent exercise;Long Term: Able to check pulse independently and accurately Short Term: Able to explain why pulse checking is important during independent exercise;Long Term: Able to check pulse independently and accurately    Understanding of Exercise Prescription Yes Yes Yes Yes    Intervention Provide education, explanation, and written materials on patient's individual exercise prescription Provide education, explanation, and written materials on patient's individual exercise prescription Provide education, explanation, and written materials on patient's individual exercise prescription Provide education, explanation, and written materials on patient's individual exercise prescription    Expected Outcomes Short Term: Able to explain program exercise prescription;Long Term: Able to explain home exercise prescription to exercise independently Short Term: Able to explain program exercise prescription;Long Term: Able to explain home exercise prescription to exercise independently Short Term: Able to explain program exercise prescription;Long Term: Able to explain home exercise prescription to exercise independently Short Term: Able to explain program exercise prescription;Long Term: Able to explain home exercise prescription to exercise independently             Exercise Goals Re-Evaluation:  Exercise  Goals Re-Evaluation     Row Name 11/11/21 1321 12/09/21 1308 01/07/22 1117         Exercise Goal Re-Evaluation   Exercise Goals Review Increase Physical Activity;Increase Strength and Stamina;Able to understand and use rate of perceived exertion (RPE) scale;Knowledge and understanding of Target Heart Rate Range (THRR);Able to check pulse  independently;Understanding of Exercise Prescription Increase Physical Activity;Increase Strength and Stamina;Able to understand and use rate of perceived exertion (RPE) scale;Knowledge and understanding of Target Heart Rate Range (THRR);Able to check pulse independently;Understanding of Exercise Prescription Increase Physical Activity;Increase Strength and Stamina;Able to understand and use rate of perceived exertion (RPE) scale;Knowledge and understanding of Target Heart Rate Range (THRR);Able to check pulse independently;Understanding of Exercise Prescription     Comments Pt has completed 7 sessions of cardiac rehab. He is progressing during class by increasing his workload for both pieces of equipment. He is currently exercising at 2.83 METs on the stepper. Will continue to monitor and progress as able. Pt has completed 18 sessions of cardiac rehab. He is increasing his level on both pieces of equipment recently. He is currently exercising at 2.36 METs on the stepper. Will continue to montior and progress as able. Pt has completed 27 seesion of CR. He continues to increase his level and steps. He is currently exercising at 3.20 METs on the stepper. Will comtinue to monitor and progress as able.,     Expected Outcomes Through exercise at rehab and home, the patient will meet their stated goals. Through exercise at rehab and home, the patient will meet their stated goals. Through exercise at rehab and home, the patient will meet their stated goals.              Nutrition & Weight - Outcomes:  Pre Biometrics - 10/24/21 1009       Pre Biometrics   Height '5\' 9"'$   (1.753 m)    Weight 233 lb 11 oz (106 kg)    Waist Circumference 50 inches    Hip Circumference 41 inches    Waist to Hip Ratio 1.22 %    BMI (Calculated) 34.49    Triceps Skinfold 10 mm    % Body Fat 33.3 %    Grip Strength 28.4 kg    Flexibility 0 in    Single Leg Stand 2 seconds             Post Biometrics - 01/27/22 1411        Post  Biometrics   Height '5\' 9"'$  (1.753 m)    Weight 223 lb 15.8 oz (101.6 kg)    Waist Circumference 47.5 inches    Hip Circumference 39 inches    Waist to Hip Ratio 1.22 %    BMI (Calculated) 33.06    Triceps Skinfold 10 mm    % Body Fat 33.2 %    Grip Strength 28 kg    Flexibility 0 in    Single Leg Stand 4.16 seconds             Nutrition:  Nutrition Therapy & Goals - 10/28/21 1049       Personal Nutrition Goals   Comments Patient scored 45 on his diet assessment. We offer 2 educational sessions on heart hearthy nutrition with handouts and assistance with RD referral if patient is interested.      Intervention Plan   Intervention Nutrition handout(s) given to patient.    Expected Outcomes Short Term Goal: Understand basic principles of dietary content, such as calories, fat, sodium, cholesterol and nutrients.             Nutrition Discharge:  Nutrition Assessments - 02/04/22 1007       MEDFICTS Scores   Pre Score 45    Post Score 27    Score Difference -18  Education Questionnaire Score:  Knowledge Questionnaire Score - 02/04/22 1005       Knowledge Questionnaire Score   Pre Score 22/24    Post Score 21/24             Goals reviewed with patient; copy given to patient. Pt graduated from CR after 34 sessions. He was able to improve his walk test distance by 22.7%, and his MET level at discharge was 3.82. He reports that he would like to join our maintenance program in Feb. I have given him the referral form, and instructed him to have [name] sign it when he sees him on 2/2.

## 2022-02-07 ENCOUNTER — Ambulatory Visit: Payer: PPO | Attending: Cardiovascular Disease | Admitting: Cardiovascular Disease

## 2022-02-07 ENCOUNTER — Encounter: Payer: Self-pay | Admitting: Cardiovascular Disease

## 2022-02-07 VITALS — BP 128/78 | HR 74 | Ht 69.0 in | Wt 224.8 lb

## 2022-02-07 DIAGNOSIS — Z952 Presence of prosthetic heart valve: Secondary | ICD-10-CM | POA: Diagnosis not present

## 2022-02-07 DIAGNOSIS — N182 Chronic kidney disease, stage 2 (mild): Secondary | ICD-10-CM

## 2022-02-07 DIAGNOSIS — I480 Paroxysmal atrial fibrillation: Secondary | ICD-10-CM | POA: Diagnosis not present

## 2022-02-07 DIAGNOSIS — I2510 Atherosclerotic heart disease of native coronary artery without angina pectoris: Secondary | ICD-10-CM | POA: Diagnosis not present

## 2022-02-07 NOTE — Patient Instructions (Signed)
Medication Instructions:  Your physician recommends that you continue on your current medications as directed. Please refer to the Current Medication list given to you today.   Labwork: None  Testing/Procedures: None  Follow-Up: Follow up with [name] in 6 months.   Any Other Special Instructions Will Be Listed Below (If Applicable).     If you need a refill on your cardiac medications before your next appointment, please call your pharmacy.

## 2022-02-17 ENCOUNTER — Ambulatory Visit: Payer: Medicare HMO | Admitting: "Endocrinology

## 2022-02-17 DIAGNOSIS — I480 Paroxysmal atrial fibrillation: Secondary | ICD-10-CM | POA: Diagnosis not present

## 2022-02-17 DIAGNOSIS — Z1331 Encounter for screening for depression: Secondary | ICD-10-CM | POA: Diagnosis not present

## 2022-02-17 DIAGNOSIS — E559 Vitamin D deficiency, unspecified: Secondary | ICD-10-CM | POA: Diagnosis not present

## 2022-02-17 DIAGNOSIS — I700 Atherosclerosis of aorta: Secondary | ICD-10-CM | POA: Diagnosis not present

## 2022-02-17 DIAGNOSIS — E1165 Type 2 diabetes mellitus with hyperglycemia: Secondary | ICD-10-CM | POA: Diagnosis not present

## 2022-02-17 DIAGNOSIS — N183 Chronic kidney disease, stage 3 unspecified: Secondary | ICD-10-CM | POA: Diagnosis not present

## 2022-02-17 DIAGNOSIS — Z125 Encounter for screening for malignant neoplasm of prostate: Secondary | ICD-10-CM | POA: Diagnosis not present

## 2022-02-17 DIAGNOSIS — D518 Other vitamin B12 deficiency anemias: Secondary | ICD-10-CM | POA: Diagnosis not present

## 2022-02-17 DIAGNOSIS — Z0001 Encounter for general adult medical examination with abnormal findings: Secondary | ICD-10-CM | POA: Diagnosis not present

## 2022-02-17 DIAGNOSIS — E1129 Type 2 diabetes mellitus with other diabetic kidney complication: Secondary | ICD-10-CM | POA: Diagnosis not present

## 2022-02-17 DIAGNOSIS — E039 Hypothyroidism, unspecified: Secondary | ICD-10-CM | POA: Diagnosis not present

## 2022-02-17 DIAGNOSIS — E1022 Type 1 diabetes mellitus with diabetic chronic kidney disease: Secondary | ICD-10-CM | POA: Diagnosis not present

## 2022-02-17 DIAGNOSIS — Z6833 Body mass index (BMI) 33.0-33.9, adult: Secondary | ICD-10-CM | POA: Diagnosis not present

## 2022-02-17 DIAGNOSIS — M150 Primary generalized (osteo)arthritis: Secondary | ICD-10-CM | POA: Diagnosis not present

## 2022-02-17 DIAGNOSIS — E1140 Type 2 diabetes mellitus with diabetic neuropathy, unspecified: Secondary | ICD-10-CM | POA: Diagnosis not present

## 2022-02-17 DIAGNOSIS — E1322 Other specified diabetes mellitus with diabetic chronic kidney disease: Secondary | ICD-10-CM | POA: Diagnosis not present

## 2022-02-18 ENCOUNTER — Ambulatory Visit (INDEPENDENT_AMBULATORY_CARE_PROVIDER_SITE_OTHER): Payer: PPO | Admitting: Internal Medicine

## 2022-02-18 ENCOUNTER — Encounter: Payer: Self-pay | Admitting: Internal Medicine

## 2022-02-18 VITALS — BP 111/69 | HR 60 | Temp 97.8°F | Ht 69.0 in | Wt 226.8 lb

## 2022-02-18 DIAGNOSIS — K219 Gastro-esophageal reflux disease without esophagitis: Secondary | ICD-10-CM | POA: Diagnosis not present

## 2022-02-18 DIAGNOSIS — Z800 Family history of malignant neoplasm of digestive organs: Secondary | ICD-10-CM

## 2022-02-18 MED ORDER — PANTOPRAZOLE SODIUM 40 MG PO TBEC: 40.0000 mg | DELAYED_RELEASE_TABLET | Freq: Every evening | ORAL | 3 refills | Status: DC

## 2022-02-18 NOTE — Progress Notes (Signed)
Primary Care Physician:  [name], MD Primary Gastroenterologist:  Dr. Gala Romney  Pre-Procedure History & Physical: HPI:  William [name] is a 77 y.o. male here for follow-up of GERD and colonic adenomas.  History of iron deficiency anemia resolved.  Prior EGD negative for H. pylori by histology.  4 small adenomas removed from his colon 1 year ago.  Dr. Abbey Chatters recommended a follow-up colonoscopy in 3 years.  Had aortic valve repair/replacement and a DES placed last fall.  He is going to cardiac rehab 3 times weekly he continues to be anticoagulated.  Had any melena or rectal bleeding denies any difficulties with the bowel function reflux well-controlled on pantoprazole.  No dysphagia.  CBC now normal.  Past Medical History:  Diagnosis Date   Anxiety    Aortic stenosis    Arthritis    CAD (coronary artery disease)    a. s/p DES to distal RCA in 08/2013, DES to LAD 07/2021   Cancer 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 Angelena Form & Cyndia Bent    Past Surgical History:  Procedure Laterality Date   APPENDECTOMY     BIOPSY  11/03/2019   Benign gastric mucosa with reactive changes and focal inflammation   BIOPSY  03/11/2021   Procedure: BIOPSY;  Surgeon: [name], DO;  [facility];  Service: Endoscopy;;   COLONOSCOPY WITH PROPOFOL N/A 02/15/2018   12 polyps ranging in 5 to 20 mm in size were tubular adenoma and recommended repeat exam in 2023   COLONOSCOPY WITH PROPOFOL N/A 03/11/2021   Procedure: COLONOSCOPY WITH PROPOFOL;   Surgeon: [name], DO;  [facility];  Service: Endoscopy;  Laterality: N/A;  9:15am   CORONARY STENT INTERVENTION N/A 07/15/2021   Procedure: CORONARY STENT INTERVENTION;  Surgeon: [name], MD;  Location: [place] [facility];  Service: Cardiovascular;  Laterality: N/A;   CORONARY STENT PLACEMENT  08/10/2013   ESOPHAGOGASTRODUODENOSCOPY (EGD) WITH PROPOFOL N/A 11/03/2019   normal esophagus, small hiatal hernia, diffuse erythematous mucosa in the entire stomach with scattered erosions.  Status post gastric biopsies for histology.  Surgical pathology found the biopsies to be benign gastric mucosa with reactive changes and focal inflammation, negative for H. Pylori.   FLEXIBLE SIGMOIDOSCOPY N/A 11/03/2019   attempted colonoscopy but inadequate prep   gsw to abd     INTRAOPERATIVE TRANSTHORACIC ECHOCARDIOGRAM N/A 09/17/2021   Procedure: INTRAOPERATIVE TRANSTHORACIC ECHOCARDIOGRAM;  Surgeon: [name], MD;  Location: [place] [facility];  Service: Open Heart Surgery;  Laterality: N/A;   INTRAVASCULAR LITHOTRIPSY  07/15/2021   Procedure: INTRAVASCULAR LITHOTRIPSY;  Surgeon: [name], MD;  Location: [place] [facility];  Service: Cardiovascular;;   LEFT HEART CATHETERIZATION WITH CORONARY ANGIOGRAM N/A 08/10/2013   Procedure: LEFT HEART CATHETERIZATION WITH CORONARY ANGIOGRAM;  Surgeon: [name], MD;  Location: [place] [facility];  Service: Cardiovascular;  Laterality: N/A;   MULTIPLE EXTRACTIONS WITH  ALVEOLOPLASTY N/A 08/22/2021   Procedure: MULTIPLE EXTRACTION WITH ALVEOLOPLASTY;  Surgeon: [name], [name];  Location: [place];  Service: Dentistry;  Laterality: N/A;   POLYPECTOMY  02/15/2018   Procedure: POLYPECTOMY;  Surgeon: [name], MD;  [facility];  Service: Endoscopy;;  colon   POLYPECTOMY  03/11/2021   Procedure: POLYPECTOMY;  Surgeon: [name], DO;  [facility];  Service: Endoscopy;;    RIGHT/LEFT HEART CATH AND CORONARY ANGIOGRAPHY N/A 07/15/2021   Procedure: RIGHT/LEFT HEART CATH AND CORONARY ANGIOGRAPHY;  Surgeon: [name], MD;  Location: [place] [facility];  Service: Cardiovascular;  Laterality: N/A;   TRANSCATHETER AORTIC VALVE REPLACEMENT, TRANSFEMORAL N/A 09/17/2021   Procedure: Transcatheter Aortic Valve Replacement, Transfemoral;  Surgeon: [name], MD;  Location: [place] [facility];  Service: Open Heart Surgery;  Laterality: N/A;    Prior to Admission medications   Medication Sig Start Date End Date Taking? Authorizing Provider  acarbose (PRECOSE) 100 MG tablet Take 100 mg by mouth 3 (three) times daily with meals.    Yes [provider]  ACCU-CHEK GUIDE test strip  05/21/21  Yes [provider]  Accu-Chek Softclix Lancets lancets 1 each 3 (three) times daily. 05/20/21  Yes [provider]  Acetylcysteine (NAC 600) 600 MG CAPS Take 1,200 mg by mouth in the morning.   Yes [provider]  alendronate (FOSAMAX) 70 MG tablet Take 1 tablet (70 mg total) by mouth every 7 (seven) days. Take with a full glass of water on an empty stomach. Patient taking differently: Take 70 mg by mouth every 7 (seven) days. Take with a full glass of water on an empty stomach. Taken Saturday/Sunday 04/15/21  Yes Nida, Marella Chimes, MD  alfuzosin (UROXATRAL) 10 MG 24 hr tablet Take 10 mg by mouth daily. 12/26/21  Yes [provider]  amLODipine (NORVASC) 5 MG tablet TAKE 1 TABLET(5 MG) BY MOUTH DAILY Patient taking differently: Take 5 mg by mouth every evening. 11/30/17  Yes Herminio Commons, MD  apixaban (ELIQUIS) 5 MG TABS tablet TAKE 1 TABLET(5 MG) BY MOUTH TWICE DAILY 01/31/22  Yes Josue Hector, MD  Ascorbic Acid (VITAMIN C) 1000 MG tablet Take 1,000 mg by mouth every morning.   Yes [provider]  Cholecalciferol (VITAMIN D3) 125 MCG (5000 UT) TABS Take 5,000 Units by mouth in the morning.   Yes  [provider]  clopidogrel (PLAVIX) 75 MG tablet Take 1 tablet (75 mg total) by mouth daily. 08/07/21 08/07/22 Yes [name], MD  Coenzyme Q10 (COQ10) 100 MG CAPS Take 100 mg by mouth in the morning.   Yes [provider]  FARXIGA 10 MG TABS tablet Take 10 mg by mouth in the morning. 12/01/17  Yes [provider]  furosemide (LASIX) 20 MG tablet Take 20 mg by mouth 2 (two) times daily.  10/11/14  Yes [provider]  glipiZIDE (GLUCOTROL) 10 MG tablet Take 10 mg by mouth 2 (two) times daily. 08/02/13  Yes [provider]  Glucosamine-Chondroitin (MOVE FREE PO) Take 1 tablet by mouth in the morning and at bedtime.   Yes [provider]  HYDROcodone-acetaminophen (NORCO) 10-325 MG tablet Take 1 tablet by mouth every 6 (six) hours as needed for moderate pain.   Yes [provider]  LEVEMIR FLEXTOUCH 100 UNIT/ML Pen Inject 90 Units into the skin at bedtime.  07/08/16  Yes [provider]  lisinopril (ZESTRIL) 20 MG tablet  Take 1 tablet (20 mg total) by mouth daily. Patient taking differently: Take 20 mg by mouth every evening. 03/28/19  Yes Herminio Commons, MD  Magnesium 400 MG TABS Take 400 mg by mouth in the morning.   Yes [provider]  Menatetrenone (VITAMIN K2) 100 MCG TABS Take 100 mcg by mouth in the morning.   Yes [provider]  metFORMIN (GLUCOPHAGE) 1000 MG tablet Take 1,000 mg by mouth 2 (two) times daily. 09/14/14  Yes [provider]  metFORMIN (GLUCOPHAGE-XR) 500 MG 24 hr tablet Take 500 mg by mouth every evening. Take with 1000 Metformin   Yes [provider]  metoprolol tartrate (LOPRESSOR) 25 MG tablet Take 1 tablet (25 mg total) by mouth 2 (two) times daily. 09/23/21  Yes Margie Billet, NP  nitroGLYCERIN (NITROSTAT) 0.4 MG SL tablet Place 1 tablet (0.4 mg total) under the tongue every 5 (five) minutes x 3 doses as needed for chest pain (if no relief after 2nd dose,  proceed to the ED for an evalution or call 911). 07/02/20  Yes Verta Ellen., NP  pantoprazole (PROTONIX) 40 MG tablet Take 40 mg by mouth every evening.  01/04/18  Yes [provider]  potassium citrate (UROCIT-K) 5 MEQ (540 MG) SR tablet Take 5 mEq by mouth 3 (three) times daily with meals. 03/28/21  Yes [provider]  QUERCETIN PO Take 1,000 mg by mouth in the morning.   Yes [provider]  rosuvastatin (CRESTOR) 20 MG tablet Take 20 mg by mouth every evening.    Yes [provider]  silodosin (RAPAFLO) 8 MG CAPS capsule Take 1 capsule (8 mg total) by mouth daily with breakfast. 10/29/21  Yes McKenzie, Candee Furbish, MD  trolamine salicylate (BLUE-EMU HEMP) 10 % cream Apply 1 application topically as needed for muscle pain.   Yes [provider]  vitamin B-12 (CYANOCOBALAMIN) 1000 MCG tablet Take 1,000 mcg by mouth in the morning.   Yes [provider]  zinc gluconate 50 MG tablet Take 50 mg by mouth in the morning.   Yes [provider]    Allergies as of 02/18/2022   (No Known Allergies)    Family History  Problem Relation Age of Onset   Diabetes Mother    Heart attack Mother 60   Pulmonary embolism Father    Colon cancer Brother 71       Passed age 25 from colon ca   Gastric cancer Neg Hx    Esophageal cancer Neg Hx     Social History   Socioeconomic History   Marital status: Widowed    Spouse name: Not on file   Number of children: 5   Years of education: Not on file   Highest education level: Not on file  Occupational History   Occupation: Retired Archivist  Tobacco Use   Smoking status: Former    Packs/day: 3.00    Years: 45.00    Total pack years: 135.00    Types: Cigarettes    Start date: 01/06/1962    Quit date: 10/24/2004    Years since quitting: 17.3   Smokeless tobacco: Former    Quit date: 08/10/2005  Vaping Use   Vaping Use: Never used  Substance and Sexual Activity   Alcohol  use: Not Currently    Comment: occasional   Drug use: No   Sexual activity: Not Currently  Other Topics Concern   Not on file  Social History Narrative  Not on file   Social Determinants of Health   Financial Resource Strain: Not on file  Food Insecurity: Not on file  Transportation Needs: Not on file  Physical Activity: Not on file  Stress: Not on file  Social Connections: Not on file  Intimate Partner Violence: Not on file    Review of Systems: See HPI, otherwise negative ROS  Physical Exam: BP 111/69 (BP Location: Left Arm, Patient Position: Sitting, Cuff Size: Large)   Pulse 60   Temp 97.8 F (36.6 C) (Oral)   Ht 5' 9"$  (1.753 m)   Wt 226 lb 12.8 oz (102.9 kg)   SpO2 97%   BMI 33.49 kg/m  General:   Alert,  Well-developed, well-nourished, pleasant and cooperative in NAD Neck:  Supple; no masses or thyromegaly. No significant cervical adenopathy. Lungs:  Clear throughout to auscultation.   No wheezes, crackles, or rhonchi. No acute distress. Heart:  Regular rate and rhythm; no murmurs, clicks, rubs,  or gallops. Abdomen: Non-distended, normal bowel sounds.  Soft and nontender without appreciable mass or hepatosplenomegaly.  Pulses:  Normal pulses noted. Extremities:  Without clubbing or edema.  Impression/Plan: 77 year old gentleman with multiple comorbidities presents for follow-up of GERD.  Symptoms well-controlled on pantoprazole 40 mg once daily.  History of colonic adenoma (4) subcentimeter adenomas removed 2022; recommendations for 3-year follow-up examination.  This approach may not be needed.  Recommendations  Continue taking pantoprazole 40 mg daily.  Best taken 30 minutes before breakfast every day (dispense #90 with 3 refills)  May or may not need 1 more colonoscopy in 2 years  Office visit here in 1 year.    Notice: This dictation was prepared with Dragon dictation along with smaller phrase technology. Any transcriptional errors that result from  this process are unintentional and may not be corrected upon review.

## 2022-02-18 NOTE — Patient Instructions (Addendum)
It was good to see you again today!  Glad to see your anemia has gone by  Continue taking pantoprazole 40 mg daily.  Best taken 30 minutes before breakfast every day (dispense #90 with 3 refills)  May or may not need 1 more colonoscopy in 2 years  Office visit here in 1 year.

## 2022-03-04 DIAGNOSIS — I129 Hypertensive chronic kidney disease with stage 1 through stage 4 chronic kidney disease, or unspecified chronic kidney disease: Secondary | ICD-10-CM | POA: Diagnosis not present

## 2022-03-04 DIAGNOSIS — E212 Other hyperparathyroidism: Secondary | ICD-10-CM | POA: Diagnosis not present

## 2022-03-04 DIAGNOSIS — E1122 Type 2 diabetes mellitus with diabetic chronic kidney disease: Secondary | ICD-10-CM | POA: Diagnosis not present

## 2022-03-04 DIAGNOSIS — M810 Age-related osteoporosis without current pathological fracture: Secondary | ICD-10-CM | POA: Diagnosis not present

## 2022-03-04 DIAGNOSIS — N189 Chronic kidney disease, unspecified: Secondary | ICD-10-CM | POA: Diagnosis not present

## 2022-03-04 DIAGNOSIS — R809 Proteinuria, unspecified: Secondary | ICD-10-CM | POA: Diagnosis not present

## 2022-03-04 DIAGNOSIS — E1129 Type 2 diabetes mellitus with other diabetic kidney complication: Secondary | ICD-10-CM | POA: Diagnosis not present

## 2022-03-04 DIAGNOSIS — E210 Primary hyperparathyroidism: Secondary | ICD-10-CM | POA: Diagnosis not present

## 2022-03-04 DIAGNOSIS — N200 Calculus of kidney: Secondary | ICD-10-CM | POA: Diagnosis not present

## 2022-03-04 DIAGNOSIS — E870 Hyperosmolality and hypernatremia: Secondary | ICD-10-CM | POA: Diagnosis not present

## 2022-03-05 LAB — COMPREHENSIVE METABOLIC PANEL
ALT: 14 IU/L (ref 0–44)
AST: 15 IU/L (ref 0–40)
Albumin/Globulin Ratio: 2.3 — ABNORMAL HIGH (ref 1.2–2.2)
Albumin: 4.5 g/dL (ref 3.8–4.8)
Alkaline Phosphatase: 41 IU/L — ABNORMAL LOW (ref 44–121)
BUN/Creatinine Ratio: 13 (ref 10–24)
BUN: 17 mg/dL (ref 8–27)
Bilirubin Total: 0.4 mg/dL (ref 0.0–1.2)
CO2: 24 mmol/L (ref 20–29)
Calcium: 10.8 mg/dL — ABNORMAL HIGH (ref 8.6–10.2)
Chloride: 103 mmol/L (ref 96–106)
Creatinine, Ser: 1.31 mg/dL — ABNORMAL HIGH (ref 0.76–1.27)
Globulin, Total: 2 g/dL (ref 1.5–4.5)
Glucose: 109 mg/dL — ABNORMAL HIGH (ref 70–99)
Potassium: 4.3 mmol/L (ref 3.5–5.2)
Sodium: 142 mmol/L (ref 134–144)
Total Protein: 6.5 g/dL (ref 6.0–8.5)
eGFR: 57 mL/min/{1.73_m2} — ABNORMAL LOW (ref >59)

## 2022-03-05 LAB — LIPID PANEL
Chol/HDL Ratio: 3.1 ratio (ref 0.0–5.0)
Cholesterol, Total: 118 mg/dL (ref 100–199)
HDL: 38 mg/dL — ABNORMAL LOW (ref >39)
LDL Chol Calc (NIH): 60 mg/dL (ref 0–99)
Triglycerides: 109 mg/dL (ref 0–149)
VLDL Cholesterol Cal: 20 mg/dL (ref 5–40)

## 2022-03-05 LAB — VITAMIN D 25 HYDROXY (VIT D DEFICIENCY, FRACTURES): Vit D, 25-Hydroxy: 35.3 ng/mL (ref 30.0–100.0)

## 2022-03-05 LAB — TSH: TSH: 1.62 u[IU]/mL (ref 0.450–4.500)

## 2022-03-05 LAB — PTH, INTACT AND CALCIUM: PTH: 72 pg/mL — ABNORMAL HIGH (ref 15–65)

## 2022-03-05 LAB — MAGNESIUM: Magnesium: 1.9 mg/dL (ref 1.6–2.3)

## 2022-03-05 LAB — T4, FREE: Free T4: 1.2 ng/dL (ref 0.82–1.77)

## 2022-03-05 LAB — PHOSPHORUS: Phosphorus: 2.5 mg/dL — ABNORMAL LOW (ref 2.8–4.1)

## 2022-03-12 DIAGNOSIS — E210 Primary hyperparathyroidism: Secondary | ICD-10-CM | POA: Diagnosis not present

## 2022-03-12 DIAGNOSIS — R809 Proteinuria, unspecified: Secondary | ICD-10-CM | POA: Diagnosis not present

## 2022-03-12 DIAGNOSIS — E1122 Type 2 diabetes mellitus with diabetic chronic kidney disease: Secondary | ICD-10-CM | POA: Diagnosis not present

## 2022-03-12 DIAGNOSIS — N200 Calculus of kidney: Secondary | ICD-10-CM | POA: Diagnosis not present

## 2022-03-12 DIAGNOSIS — E870 Hyperosmolality and hypernatremia: Secondary | ICD-10-CM | POA: Diagnosis not present

## 2022-03-12 DIAGNOSIS — E1129 Type 2 diabetes mellitus with other diabetic kidney complication: Secondary | ICD-10-CM | POA: Diagnosis not present

## 2022-03-12 DIAGNOSIS — I129 Hypertensive chronic kidney disease with stage 1 through stage 4 chronic kidney disease, or unspecified chronic kidney disease: Secondary | ICD-10-CM | POA: Diagnosis not present

## 2022-03-12 DIAGNOSIS — N189 Chronic kidney disease, unspecified: Secondary | ICD-10-CM | POA: Diagnosis not present

## 2022-03-17 ENCOUNTER — Other Ambulatory Visit: Payer: Self-pay | Admitting: Physician Assistant

## 2022-03-17 ENCOUNTER — Emergency Department (HOSPITAL_COMMUNITY): Payer: PPO

## 2022-03-17 ENCOUNTER — Other Ambulatory Visit: Payer: Self-pay

## 2022-03-17 ENCOUNTER — Encounter (HOSPITAL_COMMUNITY): Payer: Self-pay | Admitting: Emergency Medicine

## 2022-03-17 ENCOUNTER — Emergency Department (HOSPITAL_COMMUNITY)
Admission: EM | Admit: 2022-03-17 | Discharge: 2022-03-17 | Disposition: A | Discharge status: Home / Self Care | Payer: PPO | Attending: Emergency Medicine | Admitting: Emergency Medicine

## 2022-03-17 ENCOUNTER — Encounter (HOSPITAL_COMMUNITY): Payer: Self-pay

## 2022-03-17 DIAGNOSIS — R600 Localized edema: Secondary | ICD-10-CM | POA: Insufficient documentation

## 2022-03-17 DIAGNOSIS — R002 Palpitations: Secondary | ICD-10-CM

## 2022-03-17 DIAGNOSIS — R000 Tachycardia, unspecified: Secondary | ICD-10-CM | POA: Diagnosis not present

## 2022-03-17 DIAGNOSIS — R42 Dizziness and giddiness: Secondary | ICD-10-CM | POA: Insufficient documentation

## 2022-03-17 LAB — TROPONIN I (HIGH SENSITIVITY)
Troponin I (High Sensitivity): 15 ng/L (ref <18)
Troponin I (High Sensitivity): 21 ng/L — ABNORMAL HIGH (ref <18)

## 2022-03-17 LAB — URINALYSIS, ROUTINE W REFLEX MICROSCOPIC
Bacteria, UA: NONE SEEN
Bilirubin Urine: NEGATIVE
Glucose, UA: >=500 mg/dL — AB
Hgb urine dipstick: NEGATIVE
Ketones, ur: NEGATIVE mg/dL
Leukocytes,Ua: NEGATIVE
Nitrite: NEGATIVE
Protein, ur: NEGATIVE mg/dL
Specific Gravity, Urine: 1.01 (ref 1.005–1.030)
pH: 5 (ref 5.0–8.0)

## 2022-03-17 LAB — CBC
HCT: 44.5 % (ref 39.0–52.0)
Hemoglobin: 14.5 g/dL (ref 13.0–17.0)
MCH: 31.2 pg (ref 26.0–34.0)
MCHC: 32.6 g/dL (ref 30.0–36.0)
MCV: 95.7 fL (ref 80.0–100.0)
Platelets: 184 10*3/uL (ref 150–400)
RBC: 4.65 MIL/uL (ref 4.22–5.81)
RDW: 14.1 % (ref 11.5–15.5)
WBC: 9.8 10*3/uL (ref 4.0–10.5)
nRBC: 0 % (ref 0.0–0.2)

## 2022-03-17 LAB — BASIC METABOLIC PANEL
Anion gap: 10 (ref 5–15)
BUN: 24 mg/dL — ABNORMAL HIGH (ref 8–23)
CO2: 25 mmol/L (ref 22–32)
Calcium: 10.2 mg/dL (ref 8.9–10.3)
Chloride: 104 mmol/L (ref 98–111)
Creatinine, Ser: 1.33 mg/dL — ABNORMAL HIGH (ref 0.61–1.24)
GFR, Estimated: 56 mL/min — ABNORMAL LOW (ref >60)
Glucose, Bld: 214 mg/dL — ABNORMAL HIGH (ref 70–99)
Potassium: 4.5 mmol/L (ref 3.5–5.1)
Sodium: 139 mmol/L (ref 135–145)

## 2022-03-17 LAB — CBG MONITORING, ED: Glucose-Capillary: 223 mg/dL — ABNORMAL HIGH (ref 70–99)

## 2022-03-17 LAB — BRAIN NATRIURETIC PEPTIDE: B Natriuretic Peptide: 38 pg/mL (ref 0.0–100.0)

## 2022-03-17 NOTE — ED Provider Notes (Signed)
[facility] Provider Note   CSN: [id number] Arrival date & time: 03/17/22  1419     History {Add pertinent medical, surgical, social history, OB history to HPI:1} Chief Complaint  Patient presents with   Dizziness    William [name] is a 77 y.o. male.  He has a history of coronary disease with stents, aortic stenosis status post TAVR.  He was at cardiac rehab today exercising when he felt acutely dizzy lightheaded and his heart rate was elevated 140.  He said his heart rate is usually in the 70s and goes up to around 100 105 when he exercises.  Symptoms lasted about 10 or 15 minutes and have resolved with rest.  He was not short of breath or diaphoretic, no chest pain nausea vomiting.  This has not happened before.  No recent change in medications and has been feeling well otherwise.  Primary cardiologist is [name].    Dizziness Quality:  Lightheadedness Severity:  Moderate Onset quality:  Sudden Duration: 15 minutes. Timing:  Constant Progression:  Resolved Chronicity:  New Context: physical activity   Relieved by: rest. Ineffective treatments:  None tried Associated symptoms: no chest pain, no headaches, no nausea, no shortness of breath, no syncope, no vision changes and no vomiting   Risk factors: heart disease        Home Medications Prior to Admission medications   Medication Sig Start Date End Date Taking? Authorizing Provider  acarbose (PRECOSE) 100 MG tablet Take 100 mg by mouth 3 (three) times daily with meals.     [provider]  ACCU-CHEK GUIDE test strip  05/21/21   [provider]  Accu-Chek Softclix Lancets lancets 1 each 3 (three) times daily. 05/20/21   [provider]  Acetylcysteine (NAC 600) 600 MG CAPS Take 1,200 mg by mouth in the morning.    [provider]  alendronate (FOSAMAX) 70 MG tablet Take 1 tablet (70 mg total) by mouth every 7 (seven) days. Take with a full  glass of water on an empty stomach. Patient taking differently: Take 70 mg by mouth every 7 (seven) days. Take with a full glass of water on an empty stomach. Taken Saturday/Sunday 04/15/21   Cassandria Anger, MD  alfuzosin (UROXATRAL) 10 MG 24 hr tablet Take 10 mg by mouth daily. 12/26/21   [provider]  amLODipine (NORVASC) 5 MG tablet TAKE 1 TABLET(5 MG) BY MOUTH DAILY Patient taking differently: Take 5 mg by mouth every evening. 11/30/17   Herminio Commons, MD  apixaban (ELIQUIS) 5 MG TABS tablet TAKE 1 TABLET(5 MG) BY MOUTH TWICE DAILY 01/31/22   Josue Hector, MD  Ascorbic Acid (VITAMIN C) 1000 MG tablet Take 1,000 mg by mouth every morning.    [provider]  Cholecalciferol (VITAMIN D3) 125 MCG (5000 UT) TABS Take 5,000 Units by mouth in the morning.    [provider]  clopidogrel (PLAVIX) 75 MG tablet Take 1 tablet (75 mg total) by mouth daily. 08/07/21 08/07/22  [name], MD  Coenzyme Q10 (COQ10) 100 MG CAPS Take 100 mg by mouth in the morning.    [provider]  FARXIGA 10 MG TABS tablet Take 10 mg by mouth in the morning. 12/01/17   [provider]  furosemide (LASIX) 20 MG tablet Take 20 mg by mouth 2 (two) times daily.  10/11/14   [provider]  glipiZIDE (GLUCOTROL) 10 MG tablet Take 10 mg  by mouth 2 (two) times daily. 08/02/13   [provider]  Glucosamine-Chondroitin (MOVE FREE PO) Take 1 tablet by mouth in the morning and at bedtime.    [provider]  HYDROcodone-acetaminophen (NORCO) 10-325 MG tablet Take 1 tablet by mouth every 6 (six) hours as needed for moderate pain.    [provider]  LEVEMIR FLEXTOUCH 100 UNIT/ML Pen Inject 90 Units into the skin at bedtime.  07/08/16   [provider]  lisinopril (ZESTRIL) 20 MG tablet Take 1 tablet (20 mg total) by mouth daily. Patient taking differently: Take 20 mg by mouth every evening. 03/28/19   Herminio Commons,  MD  Magnesium 400 MG TABS Take 400 mg by mouth in the morning.    [provider]  Menatetrenone (VITAMIN K2) 100 MCG TABS Take 100 mcg by mouth in the morning.    [provider]  metFORMIN (GLUCOPHAGE) 1000 MG tablet Take 1,000 mg by mouth 2 (two) times daily. 09/14/14   [provider]  metFORMIN (GLUCOPHAGE-XR) 500 MG 24 hr tablet Take 500 mg by mouth every evening. Take with 1000 Metformin    [provider]  metoprolol tartrate (LOPRESSOR) 25 MG tablet Take 1 tablet (25 mg total) by mouth 2 (two) times daily. 09/23/21   Margie Billet, NP  nitroGLYCERIN (NITROSTAT) 0.4 MG SL tablet Place 1 tablet (0.4 mg total) under the tongue every 5 (five) minutes x 3 doses as needed for chest pain (if no relief after 2nd dose, proceed to the ED for an evalution or call 911). 07/02/20   Verta Ellen., NP  pantoprazole (PROTONIX) 40 MG tablet Take 1 tablet (40 mg total) by mouth every evening. 02/18/22   Rourk, [name], MD  potassium citrate (UROCIT-K) 5 MEQ (540 MG) SR tablet Take 5 mEq by mouth 3 (three) times daily with meals. 03/28/21   [provider]  QUERCETIN PO Take 1,000 mg by mouth in the morning.    [provider]  rosuvastatin (CRESTOR) 20 MG tablet Take 20 mg by mouth every evening.     [provider]  silodosin (RAPAFLO) 8 MG CAPS capsule Take 1 capsule (8 mg total) by mouth daily with breakfast. 10/29/21   McKenzie, Candee Furbish, MD  trolamine salicylate (BLUE-EMU HEMP) 10 % cream Apply 1 application topically as needed for muscle pain.    [provider]  vitamin B-12 (CYANOCOBALAMIN) 1000 MCG tablet Take 1,000 mcg by mouth in the morning.    [provider]  zinc gluconate 50 MG tablet Take 50 mg by mouth in the morning.    [provider]      Allergies    Patient has no known allergies.    Review of Systems   Review of Systems  Respiratory:  Negative for shortness of breath.   Cardiovascular:   Negative for chest pain and syncope.  Gastrointestinal:  Negative for nausea and vomiting.  Neurological:  Positive for dizziness. Negative for headaches.    Physical Exam Updated Vital Signs BP 134/67 (BP Location: Left Arm)   Pulse 84   Temp (!) 97.2 F (36.2 C) (Oral)   Resp (!) 21   SpO2 93%  Physical Exam Vitals and nursing note reviewed.  Constitutional:      General: He is not in acute distress.    Appearance: Normal appearance. He is well-developed.  HENT:     Head: Normocephalic and atraumatic.  Eyes:     Conjunctiva/sclera: Conjunctivae normal.  Cardiovascular:     Rate and Rhythm: Normal rate and regular rhythm.     Heart sounds: Murmur heard.  Pulmonary:     Effort: Pulmonary effort is normal. No respiratory distress.     Breath sounds: Normal breath sounds.  Abdominal:     Palpations: Abdomen is soft.     Tenderness: There is no abdominal tenderness. There is no guarding or rebound.  Musculoskeletal:        General: No tenderness. Normal range of motion.     Cervical back: Neck supple.     Right lower leg: Edema present.     Left lower leg: Edema present.  Skin:    General: Skin is warm and dry.     Capillary Refill: Capillary refill takes less than 2 seconds.  Neurological:     General: No focal deficit present.     Mental Status: He is alert.     ED Results / Procedures / Treatments   Labs (all labs ordered are listed, but only abnormal results are displayed) Labs Reviewed  BASIC METABOLIC PANEL  CBC  URINALYSIS, ROUTINE W REFLEX MICROSCOPIC  TROPONIN I (HIGH SENSITIVITY)    EKG EKG Interpretation  Date/Time:  Monday March 17 2022 14:20:13 EDT Ventricular Rate:  87 PR Interval:  175 QRS Duration: 143 QT Interval:  400 QTC Calculation: 482 R Axis:   64 Text Interpretation: Sinus rhythm Left bundle branch block ? axis change otherwise unchanged Confirmed by Aletta Edouard [phone]) on 03/17/2022 3:02:12 PM  Radiology No results  found.  Procedures Procedures  {Document cardiac monitor, telemetry assessment procedure when appropriate:1}  Medications Ordered in ED Medications - No data to display  ED Course/ Medical Decision Making/ A&P   {   Click here for ABCD2, HEART and other calculatorsREFRESH Note before signing :1}                          Medical Decision Making Amount and/or Complexity of Data Reviewed Labs: ordered.   This patient complains of ***; this involves an extensive number of treatment Options and is a complaint that carries with it a high risk of complications and morbidity. The differential includes ***  I ordered, reviewed and interpreted labs, which included *** I ordered medication *** and reviewed PMP when indicated. I ordered imaging studies which included *** and I independently    visualized and interpreted imaging which showed *** Additional history obtained from *** Previous records obtained and reviewed *** I consulted *** and discussed lab and imaging findings and discussed disposition.  Cardiac monitoring reviewed, *** Social determinants considered, *** Critical Interventions: ***  After the interventions stated above, I reevaluated the patient and found *** Admission and further testing considered, ***   {Document critical care time when appropriate:1} {Document review of labs and clinical decision tools ie heart score, Chads2Vasc2 etc:1}  {Document your independent review of radiology images, and any outside records:1} {Document your discussion with family members, caretakers, and with consultants:1} {Document social determinants of health affecting pt's care:1} {Document your decision making why or why not admission, treatments were needed:1} Final Clinical Impression(s) / ED Diagnoses Final diagnoses:  None    Rx / DC Orders ED Discharge Orders     None

## 2022-03-17 NOTE — ED Triage Notes (Signed)
Pt just finished his session in cardiac rehab, staff called stating hr 140 and pt was dizzy. BS was over 200 when checked just pta. Pt a/o. Non diaphoretic. Color wnl. Pt stated while being assessed in cardiac rehab that he feels better with no dizziness and hr wnl but wanted to be checked out.

## 2022-03-17 NOTE — Progress Notes (Signed)
Patient was exercising in cardiac rehab in the [facility]. At the end of the session, his heart rate was 140 and he complained of dizziness. His heart rate remained elevated for 5 to 10 minutes at rest after exercise with complaints of dizziness. His O2 sat was 96% and his blood pressure was 120/62; CBG 243. His initial resting HR was 84. Rapid response was called. His heart rate came down to 90 and his dizziness subsided but he stated he did not feel like himself. He was taken to the ED for evaluation.

## 2022-03-17 NOTE — ED Notes (Signed)
Called son, Fara Olden, per pt request. He stated pt has been very forgetful lately and he was concerned for that.

## 2022-03-17 NOTE — Discharge Instructions (Signed)
You were seen in the emergency department for an episode of dizziness while exercising.  You had an EKG lab work chest x-ray and I reviewed these findings with cardiology and they felt you were appropriate to be able to go home.  Please continue your regular medications.  If you experience another episode like this please rest and see if it will pass.  If it continues longer than 20 or 30 minutes you probably need to come back to the emergency department for further evaluation.  Follow-up with your cardiologist.

## 2022-03-17 NOTE — Progress Notes (Signed)
z

## 2022-03-18 ENCOUNTER — Encounter: Payer: Self-pay | Admitting: *Deleted

## 2022-03-18 ENCOUNTER — Ambulatory Visit: Payer: PPO | Attending: Physician Assistant

## 2022-03-18 DIAGNOSIS — R002 Palpitations: Secondary | ICD-10-CM

## 2022-03-18 NOTE — Progress Notes (Unsigned)
Enrolled for [employer] to mail a ZIO XT long term holter monitor to the patients address on file.   Letter with instructions mailed to patient.  [name] to read.

## 2022-03-20 DIAGNOSIS — R002 Palpitations: Secondary | ICD-10-CM

## 2022-03-24 ENCOUNTER — Encounter: Payer: Self-pay | Admitting: "Endocrinology

## 2022-03-24 ENCOUNTER — Ambulatory Visit: Payer: PPO | Admitting: "Endocrinology

## 2022-03-24 VITALS — BP 142/58 | HR 64 | Ht 69.0 in | Wt 234.2 lb

## 2022-03-24 DIAGNOSIS — M810 Age-related osteoporosis without current pathological fracture: Secondary | ICD-10-CM

## 2022-03-24 DIAGNOSIS — E212 Other hyperparathyroidism: Secondary | ICD-10-CM

## 2022-03-24 NOTE — Progress Notes (Signed)
Endocrinology follow-up note    03/24/2022, 1:46 PM  William [name] is a 77 y.o.-year-old male, referred by his  [name], MD . Patient is returning for follow-up after he was seen in consultation for hypercalcemia/hyperparathyroidism.   Past Medical History:  Diagnosis Date   Anxiety    Aortic stenosis    Arthritis    CAD (coronary artery disease)    a. s/p DES to distal RCA in 08/2013, DES to LAD 07/2021   Cancer [facility])    prostate   CKD (chronic kidney disease)    Diabetes mellitus without complication (HCC)    Family history of colon cancer    GERD (gastroesophageal reflux disease)    Heart murmur    Hypercalcemia    Hypercholesteremia    Hypertension    Kidney stone    PAF (paroxysmal atrial fibrillation) ([place])    Personal history of colonic polyps    Pneumonia    S/P TAVR (transcatheter aortic valve replacement) 09/17/2021   s/p TAVR with a 23 mm Edwards S3UR via the TF approach by Dr. Angelena Form & Cyndia Bent    Past Surgical History:  Procedure Laterality Date   APPENDECTOMY     BIOPSY  11/03/2019   Benign gastric mucosa with reactive changes and focal inflammation   BIOPSY  03/11/2021   Procedure: BIOPSY;  Surgeon: [name], DO;  [facility];  Service: Endoscopy;;   COLONOSCOPY WITH PROPOFOL N/A 02/15/2018   12 polyps ranging in 5 to 20 mm in size were tubular adenoma and recommended repeat exam in 2023   COLONOSCOPY WITH PROPOFOL N/A 03/11/2021   Procedure: COLONOSCOPY WITH PROPOFOL;  Surgeon: [name], DO;  [facility];  Service: Endoscopy;  Laterality: N/A;  9:15am   CORONARY STENT INTERVENTION N/A 07/15/2021   Procedure: CORONARY STENT INTERVENTION;  Surgeon: [name], MD;  Location: [place] [facility];  Service: Cardiovascular;  Laterality: N/A;   CORONARY STENT PLACEMENT  08/10/2013   ESOPHAGOGASTRODUODENOSCOPY (EGD) WITH PROPOFOL N/A 11/03/2019   normal esophagus, small hiatal hernia, diffuse  erythematous mucosa in the entire stomach with scattered erosions.  Status post gastric biopsies for histology.  Surgical pathology found the biopsies to be benign gastric mucosa with reactive changes and focal inflammation, negative for H. Pylori.   FLEXIBLE SIGMOIDOSCOPY N/A 11/03/2019   attempted colonoscopy but inadequate prep   gsw to abd     INTRAOPERATIVE TRANSTHORACIC ECHOCARDIOGRAM N/A 09/17/2021   Procedure: INTRAOPERATIVE TRANSTHORACIC ECHOCARDIOGRAM;  Surgeon: [name], MD;  Location: [place] [facility];  Service: Open Heart Surgery;  Laterality: N/A;   INTRAVASCULAR LITHOTRIPSY  07/15/2021   Procedure: INTRAVASCULAR LITHOTRIPSY;  Surgeon: [name], MD;  Location: [place] [facility];  Service: Cardiovascular;;   LEFT HEART CATHETERIZATION WITH CORONARY ANGIOGRAM N/A 08/10/2013   Procedure: LEFT HEART CATHETERIZATION WITH CORONARY ANGIOGRAM;  Surgeon: [name], MD;  Location: [place] [facility];  Service: Cardiovascular;  Laterality: N/A;   MULTIPLE EXTRACTIONS WITH ALVEOLOPLASTY N/A 08/22/2021   Procedure: MULTIPLE EXTRACTION WITH ALVEOLOPLASTY;  Surgeon: [name], [name];  Location: [place];  Service: Dentistry;  Laterality: N/A;   POLYPECTOMY  02/15/2018   Procedure: POLYPECTOMY;  Surgeon: [name], MD;  [facility];  Service: Endoscopy;;  colon   POLYPECTOMY  03/11/2021   Procedure: POLYPECTOMY;  Surgeon: [name], DO;  [facility];  Service: Endoscopy;;   RIGHT/LEFT HEART CATH AND CORONARY  ANGIOGRAPHY N/A 07/15/2021   Procedure: RIGHT/LEFT HEART CATH AND CORONARY ANGIOGRAPHY;  Surgeon: [name], MD;  Location: [place] [facility];  Service: Cardiovascular;  Laterality: N/A;   TRANSCATHETER AORTIC VALVE REPLACEMENT, TRANSFEMORAL N/A 09/17/2021   Procedure: Transcatheter Aortic Valve Replacement, Transfemoral;  Surgeon: [name], MD;  Location: [place] [facility];  Service: Open Heart  Surgery;  Laterality: N/A;    Social History   Tobacco Use   Smoking status: Former    Packs/day: 3.00    Years: 45.00    Additional pack years: 0.00    Total pack years: 135.00    Types: Cigarettes    Start date: 01/06/1962    Quit date: 10/24/2004    Years since quitting: 17.4   Smokeless tobacco: Former    Quit date: 08/10/2005  Vaping Use   Vaping Use: Never used  Substance Use Topics   Alcohol use: Not Currently    Comment: occasional   Drug use: No    Family History  Problem Relation Age of Onset   Diabetes Mother    Heart attack Mother 2   Pulmonary embolism Father    Colon cancer Brother 71       Passed age 28 from colon ca   Gastric cancer Neg Hx    Esophageal cancer Neg Hx     Outpatient Encounter Medications as of 03/24/2022  Medication Sig   acarbose (PRECOSE) 100 MG tablet Take 100 mg by mouth 3 (three) times daily with meals.    ACCU-CHEK GUIDE test strip    Accu-Chek Softclix Lancets lancets 1 each 3 (three) times daily.   Acetylcysteine (NAC 600) 600 MG CAPS Take 1,200 mg by mouth in the morning.   alendronate (FOSAMAX) 70 MG tablet Take 1 tablet (70 mg total) by mouth every 7 (seven) days. Take with a full glass of water on an empty stomach. (Patient taking differently: Take 70 mg by mouth every 7 (seven) days. Take with a full glass of water on an empty stomach. Taken Saturday/Sunday)   alfuzosin (UROXATRAL) 10 MG 24 hr tablet Take 10 mg by mouth daily. (Patient not taking: Reported on 03/24/2022)   amLODipine (NORVASC) 5 MG tablet TAKE 1 TABLET(5 MG) BY MOUTH DAILY (Patient taking differently: Take 5 mg by mouth every evening.)   apixaban (ELIQUIS) 5 MG TABS tablet TAKE 1 TABLET(5 MG) BY MOUTH TWICE DAILY   Ascorbic Acid (VITAMIN C) 1000 MG tablet Take 1,000 mg by mouth every morning.   Cholecalciferol (VITAMIN D3) 125 MCG (5000 UT) TABS Take 5,000 Units by mouth in the morning.   clopidogrel (PLAVIX) 75 MG tablet Take 1 tablet (75 mg total) by mouth  daily. (Patient not taking: Reported on 03/24/2022)   Coenzyme Q10 (COQ10) 100 MG CAPS Take 100 mg by mouth in the morning.   FARXIGA 10 MG TABS tablet Take 10 mg by mouth in the morning.   furosemide (LASIX) 20 MG tablet Take 20 mg by mouth 2 (two) times daily.    glipiZIDE (GLUCOTROL) 10 MG tablet Take 10 mg by mouth 2 (two) times daily.   Glucosamine-Chondroitin (MOVE FREE PO) Take 1 tablet by mouth in the morning and at bedtime. (Patient not taking: Reported on 03/24/2022)   HYDROcodone-acetaminophen (NORCO) 10-325 MG tablet Take 1 tablet by mouth every 6 (six) hours as needed for moderate pain.   LEVEMIR FLEXTOUCH 100 UNIT/ML Pen Inject 90 Units into the skin at bedtime.    lisinopril (ZESTRIL) 20 MG tablet  Take 1 tablet (20 mg total) by mouth daily. (Patient taking differently: Take 20 mg by mouth every evening.)   Magnesium 400 MG TABS Take 400 mg by mouth in the morning.   Menatetrenone (VITAMIN K2) 100 MCG TABS Take 100 mcg by mouth in the morning.   metFORMIN (GLUCOPHAGE) 1000 MG tablet Take 1,000 mg by mouth 2 (two) times daily.   metFORMIN (GLUCOPHAGE-XR) 500 MG 24 hr tablet Take 500 mg by mouth every evening. Take with 1000 Metformin   metoprolol tartrate (LOPRESSOR) 25 MG tablet Take 1 tablet (25 mg total) by mouth 2 (two) times daily.   nitroGLYCERIN (NITROSTAT) 0.4 MG SL tablet Place 1 tablet (0.4 mg total) under the tongue every 5 (five) minutes x 3 doses as needed for chest pain (if no relief after 2nd dose, proceed to the ED for an evalution or call 911).   pantoprazole (PROTONIX) 40 MG tablet Take 1 tablet (40 mg total) by mouth every evening.   potassium citrate (UROCIT-K) 5 MEQ (540 MG) SR tablet Take 5 mEq by mouth 3 (three) times daily with meals.   QUERCETIN PO Take 1,000 mg by mouth in the morning.   rosuvastatin (CRESTOR) 20 MG tablet Take 20 mg by mouth every evening.    silodosin (RAPAFLO) 8 MG CAPS capsule Take 1 capsule (8 mg total) by mouth daily with breakfast.    tamsulosin (FLOMAX) 0.4 MG CAPS capsule Take 0.4 mg by mouth daily.   trolamine salicylate (BLUE-EMU HEMP) 10 % cream Apply 1 application topically as needed for muscle pain.   vitamin B-12 (CYANOCOBALAMIN) 1000 MCG tablet Take 1,000 mcg by mouth in the morning.   zinc gluconate 50 MG tablet Take 50 mg by mouth in the morning.   No facility-administered encounter medications on file as of 03/24/2022.    No Known Allergies   HPI  William [name] was diagnosed with hypercalcemia in December 2021.  History is obtained directly from the patient as well as chart review.  Since December 2021, he was found to have slightly above target calcium level on 5 different occasions.  The highest calcium Register was 10.6 associated with a PTH of 61. His previsit labs show controlled calcium at 10.2, and PTH still slightly above at 72. Patient is on observation, expectant management for mild hyperparathyroidism. During workup for hypercalcemia, he was diagnosed with osteoporosis.  He was initiated on Fosamax which she continues to tolerate.  He gives history of prostate cancer reportedly in remission following up with urology in [facility].  Patient also has history of nephrolithiasis for the last 10 years.   Patient has no previously known history of  pituitary, adrenal dysfunctions; no family history of such dysfunctions.  His most recent labs are from December 2020. He has 80-90-pack-year history of smoking.  CT scan of his chest was consistent with benign findings except for coronary atherosclerosis-multiple coronary arteries . He is not under cardiology care-s/p stent placement.  He does not have any prior history of fragility fractures.    He has CKD with ejection fraction of 53.  He was also observed to have slightly elevated PTH related peptide which was observed to improve over time.  he is not on HCTZ or other thiazide therapy.  He has vitamin D deficiency on supplement with vitamin D3  5000 units daily. he is not on calcium supplements,  he eats dairy and green, leafy, vegetables on average amounts.  he does not have a family history of  hypercalcemia, pituitary tumors, thyroid cancer, or osteoporosis.  His medical history also includes type 2 diabetes on metformin, Farxiga and glipizide along with Levemir 90 units at bedtime. Parathyroid sestamibi scan in November 2022 did not show definitive parathyroid adenoma.    ROS:  Constitutional: + Fluctuating body weight, no fatigue, no subjective hyperthermia, no subjective hypothermia   PE: BP (!) 142/58 Comment: 140/62 L arm with manuel cuff  Pulse 64   Ht 5\' 9"  (1.753 m)   Wt 234 lb 3.2 oz (106.2 kg)   BMI 34.59 kg/m , Body mass index is 34.59 kg/m. Wt Readings from Last 3 Encounters:  03/24/22 234 lb 3.2 oz (106.2 kg)  02/18/22 226 lb 12.8 oz (102.9 kg)  02/07/22 224 lb 12.8 oz (102 kg)    Constitutional:  + BMI of 34.9, not in acute distress, normal state of mind Eyes: PERRLA, EOMI, no exophthalmos ENT: moist mucous membranes, no gross thyromegaly, no gross cervical lymphadenopathy    CMP ( most recent) CMP     Component Value Date/Time   NA 139 03/17/2022 1504   NA 142 03/04/2022 0824   K 4.5 03/17/2022 1504   CL 104 03/17/2022 1504   CO2 25 03/17/2022 1504   GLUCOSE 214 (H) 03/17/2022 1504   BUN 24 (H) 03/17/2022 1504   BUN 17 03/04/2022 0824   CREATININE 1.33 (H) 03/17/2022 1504   CALCIUM 10.2 03/17/2022 1504   PROT 6.5 03/04/2022 0824   ALBUMIN 4.5 03/04/2022 0824   AST 15 03/04/2022 0824   ALT 14 03/04/2022 0824   ALKPHOS 41 (L) 03/04/2022 0824   BILITOT 0.4 03/04/2022 0824   GFRNONAA 56 (L) 03/17/2022 1504   GFRAA >60 02/12/2018 1053      Lipid Panel ( most recent) Lipid Panel     Component Value Date/Time   CHOL 118 03/04/2022 0824   TRIG 109 03/04/2022 0824   HDL 38 (L) 03/04/2022 0824   CHOLHDL 3.1 03/04/2022 0824   CHOLHDL 3.1 10/27/2007 0900   VLDL 15 10/27/2007 0900    LDLCALC 60 03/04/2022 0824   LABVLDL 20 03/04/2022 0824  Since May 2022 his calcium has been 10.6 associated with PTH of 60-61.    Latest Reference Range & Units 03/04/22 08:24 03/17/22 15:04  Glucose 70 - 99 mg/dL 109 (H) 214 (H)  PTH, Intact 15 - 65 pg/mL 72 (H)   PTH Interp  Comment   TSH 0.450 - 4.500 uIU/mL 1.620   T4,Free(Direct) 0.82 - 1.77 ng/dL 1.20   (H): Data is abnormally high  Assessment: 1. Hypercalcemia /hyperparathyroidism 2.  Elevated PTH RP 3. Nephrolithiasis  Plan: He returns with improved calcium at 10.2, elevated but improving PTH at 72.   He does have mild CKD with ejection fraction of 53.   He was found to have elevated PTH RP of 8.8, now decreasing to 4.9.    His 24-hour urine calcium was 75. -His presentation is still possible to be due to mild primary hyperparathyroidism, considering high or unsuppressed PTH for hypercalcemia.    He also has CKD which is known to elevated PTH, as well as falsely elevated PTH RP.  - I discussed with the patient about the physiology of calcium and parathyroid hormone, and possible  effects of  increased PTH/ Calcium , including kidney stones, cardiac dysrhythmias, osteoporosis, abdominal pain, etc.   He will be continued on expectant/observation management.    - Patient also  has vitamin D deficiency, currently on vitamin D supplement.  His  last vitamin D was 31.5.    -Patient with history of nephrolithiasis, not clear if it was related to hypercalcemia.   -He does have osteoporosis based on his recent bone density.  He is tolerating Fosamax, advised to continue Fosamax 70 mg p.o. weekly.  Side effects and precautions discussed with him.    -In light of his history of prostate cancer, chronic heavy smoking, and elevated PTH RP, he was sent for pulmonary CT which showed benign findings except for the aortic atherosclerosis.  Subsequent cardiac workup revealed multiple coronary artery stenosis, patient is status post stent  placement.   He will return in 6 months with repeat labs.  If he continues to have hypercalcemia, he will be considered for either surgical intervention or Sensipar.  In light of his severe/medical coronary disease, he was approached for possible plant-based diet.  Patient is hesitant to engage at this time .  He also has type 2 diabetes, wishes to address this with his PMD. He is advised to maintain close follow-up with his PMD , cardiologist ,  as well as nephrologist.  I spent  22  minutes in the care of the patient today including review of labs from Thyroid Function, CMP, and other relevant labs ; imaging/biopsy records (current and previous including abstractions from other facilities); face-to-face time discussing  his lab results and symptoms, medications doses, his options of short and long term treatment based on the latest standards of care / guidelines;   and documenting the encounter.  Idelle Leech Kauth  participated in the discussions, expressed understanding, and voiced agreement with the above plans.  All questions were answered to his satisfaction. he is encouraged to contact clinic should he have any questions or concerns prior to his return visit.   - Return in about 6 months (around 09/24/2022) for F/U with Pre-visit Labs.   Glade Lloyd, MD [facility] [address] [place], [place] [address] Phone: [phone]  Fax: [phone]    This note was partially dictated with voice recognition software. Similar sounding words can be transcribed inadequately or may not  be corrected upon review.  03/24/2022, 1:46 PM

## 2022-04-08 DIAGNOSIS — R002 Palpitations: Secondary | ICD-10-CM | POA: Diagnosis not present

## 2022-04-10 ENCOUNTER — Telehealth: Payer: Self-pay

## 2022-04-10 NOTE — Telephone Encounter (Addendum)
Left voice message for patient to give office a call back for monitor results.  ----- Message from Deberah Pelton, NP sent at 04/10/2022  9:56 AM EDT ----- Please contact William [name] and let him know that his cardiac event monitor has been reviewed.  He was noted to have mainly normal sinus rhythm.  He had 1 brief episode of ventricular tachycardia and 1 brief episode of supraventricular tachycardia.  His atrial fibrillation occurred 1% of his total wear time.  He was also noted to have brief episodes of extra beats from the bottom portion of his heart.  Please ask him to maintain his p.o. hydration.  Cardiac event monitor did not identify rhythms that would contribute to dizziness.  No further testing is needed at this time.  Thank you.

## 2022-04-23 ENCOUNTER — Ambulatory Visit: Payer: PPO | Attending: Student | Admitting: Student

## 2022-04-23 ENCOUNTER — Encounter: Payer: Self-pay | Admitting: Student

## 2022-04-23 VITALS — BP 114/64 | HR 70 | Ht 69.0 in | Wt 229.4 lb

## 2022-04-23 DIAGNOSIS — I2510 Atherosclerotic heart disease of native coronary artery without angina pectoris: Secondary | ICD-10-CM

## 2022-04-23 DIAGNOSIS — E785 Hyperlipidemia, unspecified: Secondary | ICD-10-CM

## 2022-04-23 DIAGNOSIS — Z952 Presence of prosthetic heart valve: Secondary | ICD-10-CM

## 2022-04-23 DIAGNOSIS — I480 Paroxysmal atrial fibrillation: Secondary | ICD-10-CM

## 2022-04-23 DIAGNOSIS — I10 Essential (primary) hypertension: Secondary | ICD-10-CM | POA: Diagnosis not present

## 2022-04-23 NOTE — Progress Notes (Signed)
Cardiology Office Note    Date:  04/23/2022  ID:  Quaid, Yeakle 07-May-1945, MRN [id number] Cardiologist: Charlton Haws, MD    History of Present Illness:    William [name] is a 77 y.o. male with past medical history of CAD (s/p DES to distal RCA in 08/2013, PCI/DES to LAD in 07/2021), aortic stenosis (s/p TAVR in 09/2021), paroxysmal atrial fibrillation, HTN, HLD and Type 2 DM who presents to the office today for follow-up from a recent Emergency Department visit.   He was examined by Dr. Eden Emms in 02/2022 and was having dyspnea on exertion but denied any associated chest pain. No changes were made to his medications at that time.  In the interim, he presented to [facility] ED on 03/17/2022 from cardiac rehab due to dizziness and elevated heart rate in the 140's at rest. He was in sinus rhythm while in the ED and denied any recurrent episodes. An outpatient event monitor was recommended and this resulted and showed predominantly normal sinus rhythm with an average heart rate of 71 bpm. He did have 1 episode of NSVT for 4 beats and 1 episode of SVT for 9 beats. Did have intermittent episodes of atrial fibrillation but an overall burden of less than 1%. No changes were made to his medications based off monitor results.  In talking with the patient today, he reports overall feeling well since his recent Emergency Department visit. He denies any recurrent dizziness. He is participating in maintenance classes for cardiac rehab and says his vitals have overall been well-controlled since. He denies any recent chest pain or progressive dyspnea on exertion. No recent palpitations, orthopnea, PND or pitting edema.  Studies Reviewed:   EKG: EKG is not ordered today. EKG from 03/17/2022 is reviewed and demonstrates NSR, HR 87 with known LBBB.    R/LHC: 07/2021  Prox RCA lesion is 100% stenosed.   Prox Cx to Mid Cx lesion is 40% stenosed.   Dist Cx lesion is 50% stenosed.   2nd Mrg lesion is 40%  stenosed.   Mid LAD-1 lesion is 95% stenosed.   Mid LAD-2 lesion is 50% stenosed.   1st Diag lesion is 50% stenosed.   Previously placed Dist RCA stent of unknown type is  widely patent.   A drug-eluting stent was successfully placed using a SYNERGY XD [id number].   Post intervention, there is a 0% residual stenosis.   Severe mid LAD stenosis with heavy calcification. Moderate mid LAD stenosis. Moderate diagonal stenosis Successful PTCA/DES x 1 mid LAD with intracoronary lithotripsy prior to stent placement Moderate mid Circumflex stenosis. Moderate stenosis OM3 Large dominant RCA with total occlusion (CTO) of the mid RCA. The distal RCA fills from left to right collaterals.  RA 6, RV 47/13/14, PA 35/15 mean 29, PCWP 9, AO 116/52   Recommendations: Will continue ASA and Plavix for at least six months. Will continue workup for TAVR.    Echocardiogram: 10/2021 IMPRESSIONS     1. Left ventricular ejection fraction, by estimation, is 60 to 65%. The  left ventricle has normal function. The left ventricle has no regional  wall motion abnormalities. There is moderate concentric left ventricular  hypertrophy. Left ventricular  diastolic parameters are consistent with Grade II diastolic dysfunction  (pseudonormalization).   2. Right ventricular systolic function is normal. The right ventricular  size is normal. Tricuspid regurgitation signal is inadequate for assessing  PA pressure.   3. Left atrial size was moderately dilated.   4. The  mitral valve is abnormal. Mild to moderate mitral valve  regurgitation. Mild mitral stenosis. The mean mitral valve gradient is 4.0  mmHg at HR 63 bpm. Moderate mitral annular calcification.   5. The aortic valve has been repaired/replaced. Aortic valve  regurgitation is not visualized. There is a 23 mm Edwards S3UR valve  present in the aortic position. Aortic valve mean gradient measures 15.0  mmHg.   6. The inferior vena cava is dilated in size with <50%  respiratory  variability, suggesting right atrial pressure of 15 mmHg.   Comparison(s): Prior images reviewed side by side. TAVR in position  without paravalvular leak. Mean gradient relatively stable at 15 mmHg  (previously 17 mmHg).   Event Monitor: 03/2022 Patient had a min HR of 51 bpm, max HR of 184 bpm, and avg HR of 71 bpm. Predominant underlying rhythm was Sinus Rhythm. Bundle Branch Block/IVCD was present. QRS morphology changes were present throughout recording. 1 run of Ventricular Tachycardia  occurred lasting 4 beats with a max rate of 184 bpm (avg 158 bpm). 1 run of Supraventricular Tachycardia occurred lasting 9 beats with a max rate of 102 bpm (avg 93 bpm). Atrial Fibrillation occurred (1% burden), ranging from 68-153 bpm (avg of 98 bpm),  the longest lasting 5 hours 0 mins with an avg rate of 98 bpm. Isolated SVEs were rare (<1.0%), SVE Couplets were rare (<1.0%), and SVE Triplets were rare (<1.0%). Isolated VEs were occasional (1.9%, 26299), VE Couplets were rare (<1.0%, 388), and VE  Triplets were rare (<1.0%, 3). Ventricular Bigeminy and Trigeminy were present.  Physical Exam:   VS:  BP 114/64   Pulse 70   Ht  (1.753 m)   Wt 229 lb 6.4 oz (104.1 kg)   SpO2 93%   BMI 33.88 kg/m    Wt Readings from Last 3 Encounters:  04/23/22 229 lb 6.4 oz (104.1 kg)  03/24/22 234 lb 3.2 oz (106.2 kg)  02/18/22 226 lb 12.8 oz (102.9 kg)     GEN: Pleasant male appearing in no acute distress NECK: No JVD; No carotid bruits CARDIAC: RRR, 2/6 SEM along RUSB.  RESPIRATORY:  Clear to auscultation without rales, wheezing or rhonchi  ABDOMEN: Appears non-distended. No obvious abdominal masses. EXTREMITIES: No clubbing or cyanosis. No pitting edema.  Distal pedal pulses are 2+ bilaterally.   Assessment and Plan:   1. CAD - He is s/p DES to distal RCA in 08/2013 with PCI/DES to LAD in 07/2021. He denies any recent anginal symptoms. - He remains on Plavix 75 mg daily and suspect  this can be discontinued in 07/2022 since he is also on Eliquis for anticoagulation. Continue Lopressor 25 mg twice daily and Crestor 20 mg daily.  2. Aortic Stenosis  - He is s/p TAVR in 09/2021 and echocardiogram in 10/2021 showed normal functioning of his TAVR without perivalvular leak and mean gradient was stable at 15 mmHg.  3. Paroxysmal Atrial Fibrillation - His recent event monitor showed an overall atrial fibrillation burden of 1% and average heart rate was in the 90's when in atrial fibrillation. He has overall been feeling well and denies any recurrent symptoms. Will continue Lopressor 25 mg twice daily for rate control. I encouraged him to make [place] aware if he develops any new symptoms as this could be further titrated but we may need to reduce Amlodipine given his BP. - No reports of active bleeding. He remains on Eliquis 5 mg twice daily for anticoagulation which is the appropriate  dose at this time given his age, weight and renal function.  4. HTN - His blood pressure is well-controlled at 114/64 during today's visit and it has been well-controlled when checked at cardiac rehab. Continue current medical therapy with Amlodipine 5 mg daily, Lasix 20 mg twice daily, Lisinopril 20 mg daily and Lopressor 25 mg twice daily.  5. HLD - His LDL was at 60 in 02/2022. Continue current medical therapy with Crestor 20 mg daily.   Signed, Ellsworth Lennox, PA-C

## 2022-04-23 NOTE — Patient Instructions (Signed)
Medication Instructions:  Your physician recommends that you continue on your current medications as directed. Please refer to the Current Medication list given to you today.  *If you need a refill on your cardiac medications before your next appointment, please call your pharmacy*   Lab Work: NONE   If you have labs (blood work) drawn today and your tests are completely normal, you will receive your results only by: MyChart Message (if you have MyChart) OR A paper copy in the mail If you have any lab test that is abnormal or we need to change your treatment, we will call you to review the results.   Testing/Procedures: NONE    Follow-Up: At [facility], you and your health needs are our priority.  As part of our continuing mission to provide you with exceptional heart care, we have created designated Provider Care Teams.  These Care Teams include your primary Cardiologist (physician) and Advanced Practice Providers (APPs -  Physician Assistants and Nurse Practitioners) who all work together to provide you with the care you need, when you need it.  We recommend signing up for the patient portal called "MyChart".  Sign up information is provided on this After Visit Summary.  MyChart is used to connect with patients for Virtual Visits (Telemedicine).  Patients are able to view lab/test results, encounter notes, upcoming appointments, etc.  Non-urgent messages can be sent to your provider as well.   To learn more about what you can do with MyChart, go to [email or web address].    Your next appointment:   6 month(s)  Provider:   Peter Nishan, MD    Other Instructions Thank you for choosing  [facility]!    

## 2022-04-28 ENCOUNTER — Ambulatory Visit (HOSPITAL_COMMUNITY)
Admission: RE | Admit: 2022-04-28 | Discharge: 2022-04-28 | Disposition: A | Discharge status: Home / Self Care | Payer: PPO | Source: Ambulatory Visit | Attending: Urology | Admitting: Urology

## 2022-04-28 ENCOUNTER — Ambulatory Visit (INDEPENDENT_AMBULATORY_CARE_PROVIDER_SITE_OTHER): Payer: PPO | Admitting: Urology

## 2022-04-28 ENCOUNTER — Encounter: Payer: Self-pay | Admitting: Urology

## 2022-04-28 VITALS — BP 121/64 | HR 83

## 2022-04-28 DIAGNOSIS — N200 Calculus of kidney: Secondary | ICD-10-CM | POA: Diagnosis not present

## 2022-04-28 DIAGNOSIS — M545 Low back pain, unspecified: Secondary | ICD-10-CM | POA: Diagnosis not present

## 2022-04-28 DIAGNOSIS — N400 Enlarged prostate without lower urinary tract symptoms: Secondary | ICD-10-CM | POA: Diagnosis not present

## 2022-04-28 DIAGNOSIS — R3912 Poor urinary stream: Secondary | ICD-10-CM | POA: Diagnosis not present

## 2022-04-28 DIAGNOSIS — R109 Unspecified abdominal pain: Secondary | ICD-10-CM | POA: Diagnosis not present

## 2022-04-28 LAB — URINALYSIS, ROUTINE W REFLEX MICROSCOPIC
Bilirubin, UA: NEGATIVE
Ketones, UA: NEGATIVE
Leukocytes,UA: NEGATIVE
Nitrite, UA: NEGATIVE
Protein,UA: NEGATIVE
Specific Gravity, UA: 1.01 (ref 1.005–1.030)
Urobilinogen, Ur: 0.2 mg/dL (ref 0.2–1.0)
pH, UA: 5 (ref 5.0–7.5)

## 2022-04-28 LAB — MICROSCOPIC EXAMINATION: Bacteria, UA: NONE SEEN

## 2022-04-28 MED ORDER — SILODOSIN 8 MG PO CAPS: 8.0000 mg | ORAL_CAPSULE | Freq: Every day | ORAL | 3 refills | Status: DC

## 2022-04-28 MED ORDER — FINASTERIDE 5 MG PO TABS: 5.0000 mg | ORAL_TABLET | Freq: Every day | ORAL | 3 refills | Status: DC

## 2022-04-28 MED ORDER — POTASSIUM CITRATE ER 5 MEQ (540 MG) PO TBCR: 5.0000 meq | EXTENDED_RELEASE_TABLET | Freq: Three times a day (TID) | ORAL | 11 refills | Status: DC
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
## 2022-04-28 NOTE — Patient Instructions (Signed)

## 2022-05-14 ENCOUNTER — Other Ambulatory Visit: Payer: Self-pay

## 2022-05-14 DIAGNOSIS — I350 Nonrheumatic aortic (valve) stenosis: Secondary | ICD-10-CM

## 2022-05-14 DIAGNOSIS — Z952 Presence of prosthetic heart valve: Secondary | ICD-10-CM

## 2022-05-16 DIAGNOSIS — M1991 Primary osteoarthritis, unspecified site: Secondary | ICD-10-CM | POA: Diagnosis not present

## 2022-05-16 DIAGNOSIS — N183 Chronic kidney disease, stage 3 unspecified: Secondary | ICD-10-CM | POA: Diagnosis not present

## 2022-05-16 DIAGNOSIS — Z6834 Body mass index (BMI) 34.0-34.9, adult: Secondary | ICD-10-CM | POA: Diagnosis not present

## 2022-05-16 DIAGNOSIS — E1129 Type 2 diabetes mellitus with other diabetic kidney complication: Secondary | ICD-10-CM | POA: Diagnosis not present

## 2022-05-16 DIAGNOSIS — E1165 Type 2 diabetes mellitus with hyperglycemia: Secondary | ICD-10-CM | POA: Diagnosis not present

## 2022-05-16 DIAGNOSIS — E1322 Other specified diabetes mellitus with diabetic chronic kidney disease: Secondary | ICD-10-CM | POA: Diagnosis not present

## 2022-05-16 DIAGNOSIS — E1140 Type 2 diabetes mellitus with diabetic neuropathy, unspecified: Secondary | ICD-10-CM | POA: Diagnosis not present

## 2022-05-16 DIAGNOSIS — H1132 Conjunctival hemorrhage, left eye: Secondary | ICD-10-CM | POA: Diagnosis not present

## 2022-05-16 DIAGNOSIS — M150 Primary generalized (osteo)arthritis: Secondary | ICD-10-CM | POA: Diagnosis not present

## 2022-05-16 DIAGNOSIS — G894 Chronic pain syndrome: Secondary | ICD-10-CM | POA: Diagnosis not present

## 2022-05-16 DIAGNOSIS — I480 Paroxysmal atrial fibrillation: Secondary | ICD-10-CM | POA: Diagnosis not present

## 2022-06-04 DIAGNOSIS — L0100 Impetigo, unspecified: Secondary | ICD-10-CM | POA: Diagnosis not present

## 2022-06-04 DIAGNOSIS — H1132 Conjunctival hemorrhage, left eye: Secondary | ICD-10-CM | POA: Diagnosis not present

## 2022-06-04 DIAGNOSIS — Z6833 Body mass index (BMI) 33.0-33.9, adult: Secondary | ICD-10-CM | POA: Diagnosis not present

## 2022-06-04 DIAGNOSIS — N183 Chronic kidney disease, stage 3 unspecified: Secondary | ICD-10-CM | POA: Diagnosis not present

## 2022-06-04 DIAGNOSIS — E1322 Other specified diabetes mellitus with diabetic chronic kidney disease: Secondary | ICD-10-CM | POA: Diagnosis not present

## 2022-06-04 DIAGNOSIS — E1140 Type 2 diabetes mellitus with diabetic neuropathy, unspecified: Secondary | ICD-10-CM | POA: Diagnosis not present

## 2022-06-04 DIAGNOSIS — E1165 Type 2 diabetes mellitus with hyperglycemia: Secondary | ICD-10-CM | POA: Diagnosis not present

## 2022-06-04 DIAGNOSIS — E6609 Other obesity due to excess calories: Secondary | ICD-10-CM | POA: Diagnosis not present

## 2022-06-04 DIAGNOSIS — I480 Paroxysmal atrial fibrillation: Secondary | ICD-10-CM | POA: Diagnosis not present

## 2022-06-04 DIAGNOSIS — H6502 Acute serous otitis media, left ear: Secondary | ICD-10-CM | POA: Diagnosis not present

## 2022-06-04 DIAGNOSIS — M150 Primary generalized (osteo)arthritis: Secondary | ICD-10-CM | POA: Diagnosis not present

## 2022-06-12 DIAGNOSIS — E1122 Type 2 diabetes mellitus with diabetic chronic kidney disease: Secondary | ICD-10-CM | POA: Diagnosis not present

## 2022-06-12 DIAGNOSIS — E559 Vitamin D deficiency, unspecified: Secondary | ICD-10-CM | POA: Diagnosis not present

## 2022-06-12 DIAGNOSIS — E210 Primary hyperparathyroidism: Secondary | ICD-10-CM | POA: Diagnosis not present

## 2022-06-12 DIAGNOSIS — Z79899 Other long term (current) drug therapy: Secondary | ICD-10-CM | POA: Diagnosis not present

## 2022-06-12 DIAGNOSIS — N200 Calculus of kidney: Secondary | ICD-10-CM | POA: Diagnosis not present

## 2022-06-12 DIAGNOSIS — N189 Chronic kidney disease, unspecified: Secondary | ICD-10-CM | POA: Diagnosis not present

## 2022-06-12 DIAGNOSIS — R809 Proteinuria, unspecified: Secondary | ICD-10-CM | POA: Diagnosis not present

## 2022-06-19 DIAGNOSIS — E210 Primary hyperparathyroidism: Secondary | ICD-10-CM | POA: Diagnosis not present

## 2022-06-19 DIAGNOSIS — N200 Calculus of kidney: Secondary | ICD-10-CM | POA: Diagnosis not present

## 2022-06-19 DIAGNOSIS — N1832 Chronic kidney disease, stage 3b: Secondary | ICD-10-CM | POA: Diagnosis not present

## 2022-06-19 DIAGNOSIS — E1122 Type 2 diabetes mellitus with diabetic chronic kidney disease: Secondary | ICD-10-CM | POA: Diagnosis not present

## 2022-07-07 DIAGNOSIS — Z6834 Body mass index (BMI) 34.0-34.9, adult: Secondary | ICD-10-CM | POA: Diagnosis not present

## 2022-07-07 DIAGNOSIS — S39012A Strain of muscle, fascia and tendon of lower back, initial encounter: Secondary | ICD-10-CM | POA: Diagnosis not present

## 2022-07-07 DIAGNOSIS — E6609 Other obesity due to excess calories: Secondary | ICD-10-CM | POA: Diagnosis not present

## 2022-07-07 DIAGNOSIS — M549 Dorsalgia, unspecified: Secondary | ICD-10-CM | POA: Diagnosis not present

## 2022-08-04 ENCOUNTER — Other Ambulatory Visit: Payer: Self-pay

## 2022-08-04 ENCOUNTER — Encounter (HOSPITAL_COMMUNITY): Payer: Self-pay

## 2022-08-04 ENCOUNTER — Emergency Department (HOSPITAL_COMMUNITY): Payer: PPO

## 2022-08-04 ENCOUNTER — Telehealth (HOSPITAL_COMMUNITY): Payer: Self-pay | Admitting: Emergency Medicine

## 2022-08-04 ENCOUNTER — Observation Stay (HOSPITAL_COMMUNITY)
Admission: EM | Admit: 2022-08-04 | Discharge: 2022-08-05 | Disposition: A | Discharge status: Home / Self Care | Payer: PPO | Attending: Internal Medicine | Admitting: Internal Medicine

## 2022-08-04 DIAGNOSIS — R000 Tachycardia, unspecified: Secondary | ICD-10-CM | POA: Diagnosis not present

## 2022-08-04 DIAGNOSIS — Z87891 Personal history of nicotine dependence: Secondary | ICD-10-CM | POA: Insufficient documentation

## 2022-08-04 DIAGNOSIS — R778 Other specified abnormalities of plasma proteins: Secondary | ICD-10-CM | POA: Diagnosis not present

## 2022-08-04 DIAGNOSIS — I480 Paroxysmal atrial fibrillation: Principal | ICD-10-CM | POA: Insufficient documentation

## 2022-08-04 DIAGNOSIS — R5381 Other malaise: Secondary | ICD-10-CM

## 2022-08-04 DIAGNOSIS — E785 Hyperlipidemia, unspecified: Secondary | ICD-10-CM | POA: Diagnosis present

## 2022-08-04 DIAGNOSIS — Z794 Long term (current) use of insulin: Secondary | ICD-10-CM

## 2022-08-04 DIAGNOSIS — I10 Essential (primary) hypertension: Secondary | ICD-10-CM | POA: Diagnosis present

## 2022-08-04 DIAGNOSIS — N179 Acute kidney failure, unspecified: Secondary | ICD-10-CM | POA: Insufficient documentation

## 2022-08-04 DIAGNOSIS — E1169 Type 2 diabetes mellitus with other specified complication: Secondary | ICD-10-CM | POA: Diagnosis not present

## 2022-08-04 DIAGNOSIS — Z79899 Other long term (current) drug therapy: Secondary | ICD-10-CM | POA: Diagnosis not present

## 2022-08-04 DIAGNOSIS — E7800 Pure hypercholesterolemia, unspecified: Secondary | ICD-10-CM | POA: Diagnosis not present

## 2022-08-04 DIAGNOSIS — Z955 Presence of coronary angioplasty implant and graft: Secondary | ICD-10-CM | POA: Insufficient documentation

## 2022-08-04 DIAGNOSIS — I4891 Unspecified atrial fibrillation: Secondary | ICD-10-CM

## 2022-08-04 DIAGNOSIS — I129 Hypertensive chronic kidney disease with stage 1 through stage 4 chronic kidney disease, or unspecified chronic kidney disease: Secondary | ICD-10-CM | POA: Diagnosis not present

## 2022-08-04 DIAGNOSIS — R7989 Other specified abnormal findings of blood chemistry: Secondary | ICD-10-CM | POA: Diagnosis not present

## 2022-08-04 DIAGNOSIS — Z952 Presence of prosthetic heart valve: Secondary | ICD-10-CM | POA: Insufficient documentation

## 2022-08-04 DIAGNOSIS — Z8546 Personal history of malignant neoplasm of prostate: Secondary | ICD-10-CM | POA: Diagnosis not present

## 2022-08-04 DIAGNOSIS — R0989 Other specified symptoms and signs involving the circulatory and respiratory systems: Secondary | ICD-10-CM | POA: Diagnosis not present

## 2022-08-04 DIAGNOSIS — E119 Type 2 diabetes mellitus without complications: Secondary | ICD-10-CM

## 2022-08-04 DIAGNOSIS — Z7901 Long term (current) use of anticoagulants: Secondary | ICD-10-CM | POA: Diagnosis not present

## 2022-08-04 DIAGNOSIS — E1122 Type 2 diabetes mellitus with diabetic chronic kidney disease: Secondary | ICD-10-CM | POA: Insufficient documentation

## 2022-08-04 DIAGNOSIS — N183 Chronic kidney disease, stage 3 unspecified: Secondary | ICD-10-CM | POA: Insufficient documentation

## 2022-08-04 DIAGNOSIS — I2510 Atherosclerotic heart disease of native coronary artery without angina pectoris: Secondary | ICD-10-CM | POA: Diagnosis not present

## 2022-08-04 DIAGNOSIS — Z7984 Long term (current) use of oral hypoglycemic drugs: Secondary | ICD-10-CM | POA: Insufficient documentation

## 2022-08-04 DIAGNOSIS — R531 Weakness: Secondary | ICD-10-CM | POA: Diagnosis not present

## 2022-08-04 DIAGNOSIS — Z7902 Long term (current) use of antithrombotics/antiplatelets: Secondary | ICD-10-CM | POA: Insufficient documentation

## 2022-08-04 LAB — CBC WITH DIFFERENTIAL/PLATELET
Abs Immature Granulocytes: 0.04 10*3/uL (ref 0.00–0.07)
Basophils Absolute: 0 10*3/uL (ref 0.0–0.1)
Basophils Relative: 0 %
Eosinophils Absolute: 0.2 10*3/uL (ref 0.0–0.5)
Eosinophils Relative: 2 %
HCT: 43.3 % (ref 39.0–52.0)
Hemoglobin: 14.3 g/dL (ref 13.0–17.0)
Immature Granulocytes: 1 %
Lymphocytes Relative: 15 %
Lymphs Abs: 1.2 10*3/uL (ref 0.7–4.0)
MCH: 31.8 pg (ref 26.0–34.0)
MCHC: 33 g/dL (ref 30.0–36.0)
MCV: 96.4 fL (ref 80.0–100.0)
Monocytes Absolute: 0.6 10*3/uL (ref 0.1–1.0)
Monocytes Relative: 7 %
Neutro Abs: 6.1 10*3/uL (ref 1.7–7.7)
Neutrophils Relative %: 75 %
Platelets: 190 10*3/uL (ref 150–400)
RBC: 4.49 MIL/uL (ref 4.22–5.81)
RDW: 13.9 % (ref 11.5–15.5)
WBC: 8.2 10*3/uL (ref 4.0–10.5)
nRBC: 0 % (ref 0.0–0.2)

## 2022-08-04 LAB — BASIC METABOLIC PANEL
Anion gap: 5 (ref 5–15)
BUN: 29 mg/dL — ABNORMAL HIGH (ref 8–23)
CO2: 25 mmol/L (ref 22–32)
Calcium: 9.8 mg/dL (ref 8.9–10.3)
Chloride: 103 mmol/L (ref 98–111)
Creatinine, Ser: 1.63 mg/dL — ABNORMAL HIGH (ref 0.61–1.24)
GFR, Estimated: 44 mL/min — ABNORMAL LOW (ref >60)
Glucose, Bld: 257 mg/dL — ABNORMAL HIGH (ref 70–99)
Potassium: 4.4 mmol/L (ref 3.5–5.1)
Sodium: 133 mmol/L — ABNORMAL LOW (ref 135–145)

## 2022-08-04 LAB — MAGNESIUM: Magnesium: 1.9 mg/dL (ref 1.7–2.4)

## 2022-08-04 LAB — TROPONIN I (HIGH SENSITIVITY)
Troponin I (High Sensitivity): 25 ng/L — ABNORMAL HIGH (ref <18)
Troponin I (High Sensitivity): 76 ng/L — ABNORMAL HIGH (ref <18)
Troponin I (High Sensitivity): 158 ng/L (ref <18)
Troponin I (High Sensitivity): 180 ng/L (ref <18)

## 2022-08-04 LAB — GLUCOSE, CAPILLARY: Glucose-Capillary: 254 mg/dL — ABNORMAL HIGH (ref 70–99)

## 2022-08-04 MED ORDER — ONDANSETRON HCL 4 MG/2ML IJ SOLN: 4.0000 mg | Freq: Four times a day (QID) | INTRAMUSCULAR | Status: DC | PRN

## 2022-08-04 MED ORDER — METOPROLOL TARTRATE 25 MG PO TABS
25.0000 mg | ORAL_TABLET | Freq: Two times a day (BID) | ORAL | Status: DC
  Administered 2022-08-04 – 2022-08-05 (×2): 25 mg via ORAL
  Filled 2022-08-04 (×2): qty 1

## 2022-08-04 MED ORDER — SODIUM CHLORIDE 0.9 % IV BOLUS
500.0000 mL | Freq: Once | INTRAVENOUS | Status: AC
  Administered 2022-08-04: 500 mL via INTRAVENOUS

## 2022-08-04 MED ORDER — CLOPIDOGREL BISULFATE 75 MG PO TABS
75.0000 mg | ORAL_TABLET | Freq: Every day | ORAL | Status: DC
  Administered 2022-08-05: 75 mg via ORAL
  Filled 2022-08-04: qty 1

## 2022-08-04 MED ORDER — OXYCODONE HCL 5 MG PO TABS: 5.0000 mg | ORAL_TABLET | ORAL | Status: DC | PRN

## 2022-08-04 MED ORDER — INSULIN ASPART 100 UNIT/ML IJ SOLN
0.0000 [IU] | Freq: Every day | INTRAMUSCULAR | Status: DC
  Administered 2022-08-04: 3 [IU] via SUBCUTANEOUS

## 2022-08-04 MED ORDER — INSULIN ASPART 100 UNIT/ML IJ SOLN
0.0000 [IU] | Freq: Three times a day (TID) | INTRAMUSCULAR | Status: DC
  Administered 2022-08-05: 3 [IU] via SUBCUTANEOUS
  Administered 2022-08-05: 11 [IU] via SUBCUTANEOUS

## 2022-08-04 MED ORDER — ONDANSETRON HCL 4 MG PO TABS: 4.0000 mg | ORAL_TABLET | Freq: Four times a day (QID) | ORAL | Status: DC | PRN

## 2022-08-04 MED ORDER — APIXABAN 5 MG PO TABS
5.0000 mg | ORAL_TABLET | Freq: Two times a day (BID) | ORAL | Status: DC
  Administered 2022-08-04 – 2022-08-05 (×2): 5 mg via ORAL
  Filled 2022-08-04 (×2): qty 1

## 2022-08-04 MED ORDER — PANTOPRAZOLE SODIUM 40 MG PO TBEC: 40.0000 mg | DELAYED_RELEASE_TABLET | Freq: Every evening | ORAL | Status: DC

## 2022-08-04 MED ORDER — ACETAMINOPHEN 650 MG RE SUPP: 650.0000 mg | Freq: Four times a day (QID) | RECTAL | Status: DC | PRN

## 2022-08-04 MED ORDER — TAMSULOSIN HCL 0.4 MG PO CAPS: 0.4000 mg | ORAL_CAPSULE | Freq: Every day | ORAL | Status: DC

## 2022-08-04 MED ORDER — ACETAMINOPHEN 325 MG PO TABS: 650.0000 mg | ORAL_TABLET | Freq: Four times a day (QID) | ORAL | Status: DC | PRN
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
## 2022-08-04 NOTE — Progress Notes (Signed)
Patient arrived to room and is ambulatory in room. He is alert and  oriented times 4. Patient oriented to the room and call bell placed in patient's reach. Patient did admit to falling recently at home but stated it was while he was mowing the grass and he tripped over a rock he did not see. This placed him a moderate fall risk

## 2022-08-04 NOTE — Assessment & Plan Note (Signed)
-   Continue Lopressor - Holding lisinopril in the setting of AKI

## 2022-08-04 NOTE — ED Notes (Signed)
ED TO INPATIENT HANDOFF REPORT  ED Nurse Name and Phone #: Fredric Mare [phone]  N Name/Age/Gender William [name] 77 y.o. male Room/Bed: APA14/APA14  Code Status   Code Status: Prior  Home/SNF/Other Home Patient oriented to: self, place, time, and situation Is this baseline? Yes   Triage Complete: Triage complete  Chief Complaint Elevated troponin [R79.89]  Triage Note Pt was brought from the heart care center, nurse brought him over. Stated his pulse was running between 146-152 bp was 106/58. Nurse stated they was unable to do an EKG over there so they brought him here to be evaluated. At this time HR is 89 BP is 119/54   Allergies No Known Allergies  Level of Care/Admitting Diagnosis ED Disposition     ED Disposition  Admit   Condition  --   Comment  Hospital Area: [facility] [[id number]]  Level of Care: Telemetry [5]  Covid Evaluation: Asymptomatic - no recent exposure (last 10 days) testing not required  Diagnosis: Elevated troponin [321909]  Admitting Physician: Lilyan Gilford [[id number]]  Attending Physician: Lilyan Gilford [[id number]]          B Medical/Surgery History Past Medical History:  Diagnosis Date   Anxiety    Aortic stenosis    Arthritis    CAD (coronary artery disease)    a. s/p DES to distal RCA in 08/2013, DES to LAD 07/2021   Cancer [facility])    prostate   CKD (chronic kidney disease)    Diabetes mellitus without complication (HCC)    Family history of colon cancer    GERD (gastroesophageal reflux disease)    Heart murmur    Hypercalcemia    Hypercholesteremia    Hypertension    Kidney stone    PAF (paroxysmal atrial fibrillation) (HCC)    Personal history of colonic polyps    Pneumonia    S/P TAVR (transcatheter aortic valve replacement) 09/17/2021   s/p TAVR with a 23 mm Edwards S3UR via the TF approach by Dr. Clifton James & Laneta Simmers   Past Surgical History:  Procedure Laterality Date   APPENDECTOMY     BIOPSY  11/03/2019    Benign gastric mucosa with reactive changes and focal inflammation   BIOPSY  03/11/2021   Procedure: BIOPSY;  Surgeon: Lanelle Bal, DO;  [facility];  Service: Endoscopy;;   COLONOSCOPY WITH PROPOFOL N/A 02/15/2018   12 polyps ranging in 5 to 20 mm in size were tubular adenoma and recommended repeat exam in 2023   COLONOSCOPY WITH PROPOFOL N/A 03/11/2021   Procedure: COLONOSCOPY WITH PROPOFOL;  Surgeon: Lanelle Bal, DO;  [facility];  Service: Endoscopy;  Laterality: N/A;  9:15am   CORONARY STENT INTERVENTION N/A 07/15/2021   Procedure: CORONARY STENT INTERVENTION;  Surgeon: Kathleene Hazel, MD;  Location: [facility] INVAS[facility] CV LAB;  Service: Cardiovascular;  Laterality: N/A;   CORONARY STENT PLACEMENT  08/10/2013   ESOPHAGOGASTRODUODENOSCOPY (EGD) WITH PROPOFOL N/A 11/03/2019   normal esophagus, small hiatal hernia, diffuse erythematous mucosa in the entire stomach with scattered erosions.  Status post gastric biopsies for histology.  Surgical pathology found the biopsies to be benign gastric mucosa with reactive changes and focal inflammation, negative for H. Pylori.   FLEXIBLE SIGMOIDOSCOPY N/A 11/03/2019   attempted colonoscopy but inadequate prep   gsw to abd     INTRAOPERATIVE TRANSTHORACIC ECHOCARDIOGRAM N/A 09/17/2021   Procedure: INTRAOPERATIVE TRANSTHORACIC ECHOCARDIOGRAM;  Surgeon: Kathleene Hazel, MD;  [facility];  Service:  Open Heart Surgery;  Laterality: N/A;   LEFT HEART CATHETERIZATION WITH CORONARY ANGIOGRAM N/A 08/10/2013   Procedure: LEFT HEART CATHETERIZATION WITH CORONARY ANGIOGRAM;  Surgeon: Iran Ouch, MD;  Location: [facility] CATH LAB;  Service: Cardiovascular;  Laterality: N/A;   MULTIPLE EXTRACTIONS WITH ALVEOLOPLASTY N/A 08/22/2021   Procedure: MULTIPLE EXTRACTION WITH ALVEOLOPLASTY;  Surgeon: Sharman Cheek, [name];  Location: [facility] OR;  Service: Dentistry;  Laterality: N/A;   PERIPHERAL INTRAVASCULAR LITHOTRIPSY   07/15/2021   Procedure: INTRAVASCULAR LITHOTRIPSY;  Surgeon: Kathleene Hazel, MD;  [facility];  Service: Cardiovascular;;   POLYPECTOMY  02/15/2018   Procedure: POLYPECTOMY;  Surgeon: Corbin Ade, MD;  [facility];  Service: Endoscopy;;  colon   POLYPECTOMY  03/11/2021   Procedure: POLYPECTOMY;  Surgeon: Lanelle Bal, DO;  [facility];  Service: Endoscopy;;   RIGHT/LEFT HEART CATH AND CORONARY ANGIOGRAPHY N/A 07/15/2021   Procedure: RIGHT/LEFT HEART CATH AND CORONARY ANGIOGRAPHY;  Surgeon: Kathleene Hazel, MD;  [facility];  Service: Cardiovascular;  Laterality: N/A;   TRANSCATHETER AORTIC VALVE REPLACEMENT, TRANSFEMORAL N/A 09/17/2021   Procedure: Transcatheter Aortic Valve Replacement, Transfemoral;  Surgeon: Kathleene Hazel, MD;  [facility];  Service: Open Heart Surgery;  Laterality: N/A;     A IV Location/Drains/Wounds Patient Lines/Drains/Airways Status     Active Line/Drains/Airways     Name Placement date Placement time Site Days   Peripheral IV 08/04/22 20 G 1" Anterior;Distal;Right;Upper Arm 08/04/22  1414  Arm  less than 1            Intake/Output Last 24 hours No intake or output data in the 24 hours ending 08/04/22 1904  Labs/Imaging Results for orders placed or performed during the hospital encounter of 08/04/22 (from the past 48 hour(s))  Basic metabolic panel     Status: Abnormal   Collection Time: 08/04/22  2:12 PM  Result Value Ref Range   Sodium 133 (L) 135 - 145 mmol/L   Potassium 4.4 3.5 - 5.1 mmol/L   Chloride 103 98 - 111 mmol/L   CO2 25 22 - 32 mmol/L   Glucose, Bld 257 (H) 70 - 99 mg/dL    Comment: Glucose reference range applies only to samples taken after fasting for at least 8 hours.   BUN 29 (H) 8 - 23 mg/dL   Creatinine, Ser 1.30 (H) 0.61 - 1.24 mg/dL   Calcium 9.8 8.9 - 86.5 mg/dL   GFR, Estimated 44 (L) >60 mL/min    Comment:  (NOTE) Calculated using the CKD-EPI Creatinine Equation (2021)    Anion gap 5 5 - 15    Comment: Performed at [facility], [address] [place], [place] [address]  Troponin I (High Sensitivity)     Status: Abnormal   Collection Time: 08/04/22  2:12 PM  Result Value Ref Range   Troponin I (High Sensitivity) 25 (H) <18 ng/L    Comment: (NOTE) Elevated high sensitivity troponin I (hsTnI) values and significant  changes across serial measurements may suggest ACS but many other  chronic and acute conditions are known to elevate hsTnI results.  Refer to the "Links" section for chest pain algorithms and additional  guidance. Performed at [facility], [address], [place], [place] [address]   CBC with Differential     Status: None   Collection Time: 08/04/22  2:12 PM  Result Value Ref Range   WBC 8.2 4.0 - 10.5 K/uL  RBC 4.49 4.22 - 5.81 MIL/uL   Hemoglobin 14.3 13.0 - 17.0 g/dL   HCT 16.1 09.6 - 04.5 %   MCV 96.4 80.0 - 100.0 fL   MCH 31.8 26.0 - 34.0 pg   MCHC 33.0 30.0 - 36.0 g/dL   RDW 40.9 81.1 - 91.4 %   Platelets 190 150 - 400 K/uL   nRBC 0.0 0.0 - 0.2 %   Neutrophils Relative % 75 %   Neutro Abs 6.1 1.7 - 7.7 K/uL   Lymphocytes Relative 15 %   Lymphs Abs 1.2 0.7 - 4.0 K/uL   Monocytes Relative 7 %   Monocytes Absolute 0.6 0.1 - 1.0 K/uL   Eosinophils Relative 2 %   Eosinophils Absolute 0.2 0.0 - 0.5 K/uL   Basophils Relative 0 %   Basophils Absolute 0.0 0.0 - 0.1 K/uL   Immature Granulocytes 1 %   Abs Immature Granulocytes 0.04 0.00 - 0.07 K/uL    Comment: Performed at [facility], [address] [place], [place] [address]  Magnesium     Status: None   Collection Time: 08/04/22  2:12 PM  Result Value Ref Range   Magnesium 1.9 1.7 - 2.4 mg/dL    Comment: Performed at [facility], [address], [place], [place] [address]  Troponin I (High Sensitivity)     Status: Abnormal   Collection Time: 08/04/22  4:23 PM  Result Value Ref Range   Troponin I (High  Sensitivity) 76 (H) <18 ng/L    Comment: DELTA CHECK NOTED READ BACK AND VERIFIED WITH JESSICA NEWMAN @ 1758 ON 08/04/22 C VARNER (NOTE) Elevated high sensitivity troponin I (hsTnI) values and significant  changes across serial measurements may suggest ACS but many other  chronic and acute conditions are known to elevate hsTnI results.  Refer to the "Links" section for chest pain algorithms and additional  guidance. Performed at [facility], [address], [place], [place] [address]    DG Chest Port 1 View  Result Date: 08/04/2022 CLINICAL DATA:  weakness EXAM: PORTABLE CHEST 1 VIEW COMPARISON:  CXR 03/17/22 FINDINGS: No pleural effusion. No pneumothorax. No focal airspace opacity. Normal cardiac and mediastinal contours. Low lung volumes. No radiographically apparent displaced rib fractures. Visualized upper abdomen is unremarkable IMPRESSION: Low lung volumes. No focal airspace opacity. Electronically Signed   By: Lorenza Cambridge M.D.   On: 08/04/2022 15:00    Pending Labs Unresulted Labs (From admission, onward)    None       Vitals/Pain Today's Vitals   08/04/22 1737 08/04/22 1814 08/04/22 1902 08/04/22 1902  BP:      Pulse:    60  Resp:    17  Temp:    98.2 F (36.8 C)  TempSrc:    Oral  SpO2:    93%  Weight:      Height:      PainSc: 0-No pain 0-No pain 0-No pain     Isolation Precautions No active isolations  Medications Medications  sodium chloride 0.9 % bolus 500 mL (0 mLs Intravenous Stopped 08/04/22 1737)    Mobility walks with device - pt sts he "sometimes uses a walking stick."      Focused Assessments Cardiac Assessment Handoff:  Cardiac Rhythm: Normal sinus rhythm Lab Results  Component Value Date   CKTOTAL 182 10/27/2007   CKMB 2.8 10/27/2007   TROPONINI <0.03 08/18/2015   Lab Results  Component Value Date   DDIMER 0.35 10/14/2021   Does the Patient currently have  chest pain? No    R Recommendations: See Admitting Provider  Note  Report given to:   Additional Notes: -VS updated, denies CP/SOB.

## 2022-08-04 NOTE — Assessment & Plan Note (Signed)
-   Glucose 257 in the ED - Monitor CBGs - Sliding scale coverage

## 2022-08-04 NOTE — Assessment & Plan Note (Signed)
-   Troponin initially 27 and then 76 - Denies any chest pain, palpitations, dyspnea - Reports "not feeling well" but cannot describe it - Cardiology and noted heart rate to be in the 140s at cardiac rehab and sent patient to the ER - Cardiology consulted and wants echo in the a.m. and inpatient cardiology consult in the a.m. - Echo ordered - Trend troponin - Significant cardiac history with coronary artery disease, stents, TAVR - Monitor on telemetry

## 2022-08-04 NOTE — Assessment & Plan Note (Addendum)
-   Continue Lopressor and Plavix - Holding ACE

## 2022-08-04 NOTE — Assessment & Plan Note (Signed)
Continue Lopressor and Eliquis.

## 2022-08-04 NOTE — ED Provider Notes (Signed)
[facility] EMERGENCY DEPARTMENT AT [facility] Provider Note   CSN: [id number] Arrival date & time: 08/04/22  1334     History  Chief Complaint  Patient presents with   Tachycardia    William [name] is a 77 y.o. male.  Patient states he acutely did not feel well at around 10:00 this morning.  He decided to go to cardiac rehab and exercise to see if that helped.  There they found his heart rate to be elevated at 150 and sent him over here for further evaluation.  Currently his heart rate is in the 80s.  He denies any chest pain shortness of breath abdominal pain vomiting diarrhea urinary symptoms.  He said he still does not feel back to baseline.  He said this happened once before.  He has a history of A-fib with RVR, NSTEMI, TAVR, heart failure, diabetes hypertension.  He is on Eliquis, plavix and beta-blocker among other medications  The history is provided by the patient.  Weakness Severity:  Moderate Onset quality:  Sudden Duration:  4 hours Timing:  Constant Progression:  Unchanged Chronicity:  New Relieved by:  None tried Worsened by:  Nothing Ineffective treatments:  None tried Associated symptoms: no abdominal pain, no chest pain, no cough, no diarrhea, no fever, no nausea, no shortness of breath and no vomiting   Risk factors: congestive heart failure and coronary artery disease        Home Medications Prior to Admission medications   Medication Sig Start Date End Date Taking? Authorizing Provider  acarbose (PRECOSE) 100 MG tablet Take 100 mg by mouth 3 (three) times daily with meals.     [provider]  ACCU-CHEK GUIDE test strip  05/21/21   [provider]  Accu-Chek Softclix Lancets lancets 1 each 3 (three) times daily. 05/20/21   [provider]  Acetylcysteine (NAC 600) 600 MG CAPS Take 1,200 mg by mouth in the morning.    [provider]  alendronate (FOSAMAX) 70 MG tablet Take 1 tablet (70 mg total) by mouth every 7  (seven) days. Take with a full glass of water on an empty stomach. Patient taking differently: Take 70 mg by mouth every 7 (seven) days. Take with a full glass of water on an empty stomach. Taken Saturday/Sunday 04/15/21   Roma Kayser, MD  amLODipine (NORVASC) 5 MG tablet TAKE 1 TABLET(5 MG) BY MOUTH DAILY Patient taking differently: Take 5 mg by mouth every evening. 11/30/17   Laqueta Linden, MD  apixaban (ELIQUIS) 5 MG TABS tablet TAKE 1 TABLET(5 MG) BY MOUTH TWICE DAILY 01/31/22   Wendall Stade, MD  Ascorbic Acid (VITAMIN C) 1000 MG tablet Take 1,000 mg by mouth every morning.    [provider]  Cholecalciferol (VITAMIN D3) 125 MCG (5000 UT) TABS Take 5,000 Units by mouth in the morning.    [provider]  clopidogrel (PLAVIX) 75 MG tablet Take 1 tablet (75 mg total) by mouth daily. 08/07/21 08/07/22  Kathleene Hazel, MD  Coenzyme Q10 (COQ10) 100 MG CAPS Take 100 mg by mouth in the morning.    [provider]  FARXIGA 10 MG TABS tablet Take 10 mg by mouth in the morning. 12/01/17   [provider]  finasteride (PROSCAR) 5 MG tablet Take 1 tablet (5 mg total) by mouth daily. 04/28/22   McKenzie, Mardene Celeste, MD  furosemide (LASIX) 20 MG tablet Take 20 mg by mouth 2 (two) times daily.  10/11/14  [provider]  glipiZIDE (GLUCOTROL) 10 MG tablet Take 10 mg by mouth 2 (two) times daily. 08/02/13   [provider]  Glucosamine-Chondroitin (MOVE FREE PO) Take 1 tablet by mouth in the morning and at bedtime.    [provider]  HYDROcodone-acetaminophen (NORCO) 10-325 MG tablet Take 1 tablet by mouth every 6 (six) hours as needed for moderate pain.    [provider]  LEVEMIR FLEXTOUCH 100 UNIT/ML Pen Inject 90 Units into the skin at bedtime.  07/08/16   [provider]  lisinopril (ZESTRIL) 20 MG tablet Take 1 tablet (20 mg total) by mouth daily. Patient taking differently: Take 20 mg by mouth every  evening. 03/28/19   Laqueta Linden, MD  Magnesium 400 MG TABS Take 400 mg by mouth in the morning.    [provider]  Menatetrenone (VITAMIN K2) 100 MCG TABS Take 100 mcg by mouth in the morning.    [provider]  metFORMIN (GLUCOPHAGE) 1000 MG tablet Take 1,000 mg by mouth 2 (two) times daily. 09/14/14   [provider]  metFORMIN (GLUCOPHAGE-XR) 500 MG 24 hr tablet Take 500 mg by mouth every evening. Take with 1000 Metformin    [provider]  metoprolol tartrate (LOPRESSOR) 25 MG tablet Take 1 tablet (25 mg total) by mouth 2 (two) times daily. 09/23/21   Cyndi Bender, NP  nitroGLYCERIN (NITROSTAT) 0.4 MG SL tablet Place 1 tablet (0.4 mg total) under the tongue every 5 (five) minutes x 3 doses as needed for chest pain (if no relief after 2nd dose, proceed to the ED for an evalution or call 911). 07/02/20   Netta Neat., NP  pantoprazole (PROTONIX) 40 MG tablet Take 1 tablet (40 mg total) by mouth every evening. 02/18/22   Rourk, Gerrit Friends, MD  potassium citrate (UROCIT-K) 5 MEQ (540 MG) SR tablet Take 1 tablet (5 mEq total) by mouth 3 (three) times daily with meals. 04/28/22   McKenzie, Mardene Celeste, MD  QUERCETIN PO Take 1,000 mg by mouth in the morning.    [provider]  rosuvastatin (CRESTOR) 20 MG tablet Take 20 mg by mouth every evening.     [provider]  silodosin (RAPAFLO) 8 MG CAPS capsule Take 1 capsule (8 mg total) by mouth daily with breakfast. 04/28/22   McKenzie, Mardene Celeste, MD  trolamine salicylate (BLUE-EMU HEMP) 10 % cream Apply 1 application topically as needed for muscle pain.    [provider]  vitamin B-12 (CYANOCOBALAMIN) 1000 MCG tablet Take 1,000 mcg by mouth in the morning.    [provider]  zinc gluconate 50 MG tablet Take 50 mg by mouth in the morning.    [provider]      Allergies    Patient has no known allergies.    Review of Systems   Review of Systems  Constitutional:   Negative for fever.  Respiratory:  Negative for cough and shortness of breath.   Cardiovascular:  Negative for chest pain.  Gastrointestinal:  Negative for abdominal pain, diarrhea, nausea and vomiting.  Neurological:  Positive for weakness.    Physical Exam Updated Vital Signs BP (!) 119/54   Pulse 88   Temp 97.8 F (36.6 C) (Oral)   Resp 16   Ht 5\' 9"  (1.753 m)   Wt 102.1 kg   SpO2 94%   BMI 33.23 kg/m  Physical Exam Vitals and nursing note reviewed.  Constitutional:      General:  He is not in acute distress.    Appearance: Normal appearance. He is well-developed.  HENT:     Head: Normocephalic and atraumatic.  Eyes:     Conjunctiva/sclera: Conjunctivae normal.  Cardiovascular:     Rate and Rhythm: Normal rate and regular rhythm.     Heart sounds: Murmur heard.  Pulmonary:     Effort: Pulmonary effort is normal. No respiratory distress.     Breath sounds: Normal breath sounds.  Abdominal:     Palpations: Abdomen is soft.     Tenderness: There is no abdominal tenderness.  Musculoskeletal:        General: Normal range of motion.     Cervical back: Neck supple.     Right lower leg: Edema present.     Left lower leg: Edema present.  Skin:    General: Skin is warm and dry.     Capillary Refill: Capillary refill takes less than 2 seconds.  Neurological:     General: No focal deficit present.     Mental Status: He is alert.     Motor: No weakness.     ED Results / Procedures / Treatments   Labs (all labs ordered are listed, but only abnormal results are displayed) Labs Reviewed  BASIC METABOLIC PANEL - Abnormal; Notable for the following components:      Result Value   Sodium 133 (*)    Glucose, Bld 257 (*)    BUN 29 (*)    Creatinine, Ser 1.63 (*)    GFR, Estimated 44 (*)    All other components within normal limits  TROPONIN I (HIGH SENSITIVITY) - Abnormal; Notable for the following components:   Troponin I (High Sensitivity) 25 (*)    All other  components within normal limits  TROPONIN I (HIGH SENSITIVITY) - Abnormal; Notable for the following components:   Troponin I (High Sensitivity) 76 (*)    All other components within normal limits  CBC WITH DIFFERENTIAL/PLATELET  MAGNESIUM    EKG EKG Interpretation Date/Time:  Monday August 04 2022 13:37:16 EDT Ventricular Rate:  86 PR Interval:  181 QRS Duration:  152 QT Interval:  403 QTC Calculation: 482 R Axis:   -4  Text Interpretation: Sinus rhythm Left bundle branch block No significant change since 3/24 Confirmed by Meridee Score [id number]) on 08/04/2022 1:49:44 PM  Radiology DG Chest Port 1 View  Result Date: 08/04/2022 CLINICAL DATA:  weakness EXAM: PORTABLE CHEST 1 VIEW COMPARISON:  CXR 03/17/22 FINDINGS: No pleural effusion. No pneumothorax. No focal airspace opacity. Normal cardiac and mediastinal contours. Low lung volumes. No radiographically apparent displaced rib fractures. Visualized upper abdomen is unremarkable IMPRESSION: Low lung volumes. No focal airspace opacity. Electronically Signed   By: Lorenza Cambridge M.D.   On: 08/04/2022 15:00    Procedures Procedures    Medications Ordered in ED Medications  sodium chloride 0.9 % bolus 500 mL (0 mLs Intravenous Stopped 08/04/22 1737)    ED Course/ Medical Decision Making/ A&P Clinical Course as of 08/04/22 1800  Mon Aug 04, 2022  1431 Chest x-ray interpreted by me as no acute infiltrate.  Awaiting radiology reading. [MB]    Clinical Course User Index [MB] Terrilee Files, MD                             Medical Decision Making Amount and/or Complexity of Data Reviewed Labs: ordered. Radiology: ordered.   This patient complains of feeling bad,  elevated heart rate; this involves an extensive number of treatment Options and is a complaint that carries with it a high risk of complications and morbidity. The differential includes arrhythmia, ACS, metabolic derangement, dehydration, pneumonia, pneumothorax,  PE  I ordered, reviewed and interpreted labs, which included CBC normal chemistries with elevated glucose, creatinine elevated from priors, troponins mildly elevated need to be trended I ordered medication IV fluids and reviewed PMP when indicated. I ordered imaging studies which included chest x-ray and I independently    visualized and interpreted imaging which showed low volumes no pneumonia Previous records obtained and reviewed in epic including prior cardiology and ED visits  Cardiac monitoring reviewed, sinus with left bundle branch block Social determinants considered, no significant barriers Critical Interventions: None  After the interventions stated above, I reevaluated the patient and found patient to be hemodynamically stable although still symptomatically feeling bad Admission and further testing considered, he will at least need delta troponin.  May need cardiology input.  Patient's care signed out to Dr. Estell Harpin to follow-up on results of delta troponin.         Final Clinical Impression(s) / ED Diagnoses Final diagnoses:  Tachycardia  Malaise    Rx / DC Orders ED Discharge Orders     None         Terrilee Files, MD 08/04/22 [phone]

## 2022-08-04 NOTE — Telephone Encounter (Signed)
HR 140-150's BP 100/50 on arrival to Maintance class. Pt connected to LSI, HR showing 148.  Dr Wyline Mood called and arrival to floor immediately.  Monitor shows HR 148.  Pt reports not feeling well for last 2 hours when asked by Dr. Wyline Mood.  Pt taken to ED room 14.  Son Francis Dowse) called, left message with patients' location and reason for going to ED.  On arrival to ED, pt placed on monitor and HR 90.  Report given to Sadie Haber, RN  Started # 20 J to right ac area.  Brisk blood return noted.

## 2022-08-04 NOTE — ED Triage Notes (Signed)
Pt was brought from the heart care center, nurse brought him over. Stated his pulse was running between 146-152 bp was 106/58. Nurse stated they was unable to do an EKG over there so they brought him here to be evaluated. At this time HR is 89 BP is 119/54

## 2022-08-04 NOTE — Assessment & Plan Note (Signed)
Resume co-Q10 at discharge

## 2022-08-04 NOTE — H&P (Signed)
History and Physical    Patient: William [name] EAV:[id number] DOB: [birth year] DOA: 08/04/2022 DOS: the patient was seen and examined on 08/04/2022 PCP: Elfredia Nevins, MD  Patient coming from: Home  Chief Complaint:  Chief Complaint  Patient presents with   Tachycardia   HPI: William [name] is a 77 y.o. male with medical history significant of anxiety, coronary artery disease, CKD, GERD, hypertension, atrial fibrillation, hyperlipidemia, status post TAVR, and more presents to the ED with a chief complaint of "not feeling well."  Patient reports that he just was not feeling well since 10 AM.  He decided to go to his cardiac rehab because he thought that would make him feel better.  When they checked his vitals there they found his heart rate to be 147 and they sent him to the ER.  Patient reports that during this time he is having no chest pain, no fatigue, no weakness, no nausea, no dyspnea, no dizziness.  He is not able to describe what not feeling well feels like.  He reports this symptom of not feeling well was constant since 10 AM.  He had a normal breakfast and a normal appetite this morning.  He does have a history of atrial fibrillation.  He does drink black coffee in the morning.  Having caffeine in the morning is not new today.  Patient reports that he does not think that he could possibly be dehydrated because he is drinking 6 bottles of water per day as recommended by his nephrologist.  Patient reports he has to pee every 2-3 hours.  Patient has no other complaints at this time.  Patient does not smoke and does not drink.  Patient is full code. Review of Systems: As mentioned in the history of present illness. All other systems reviewed and are negative. Past Medical History:  Diagnosis Date   Anxiety    Aortic stenosis    Arthritis    CAD (coronary artery disease)    a. s/p DES to distal RCA in 08/2013, DES to LAD 07/2021   Cancer [facility])    prostate   CKD (chronic kidney disease)     Diabetes mellitus without complication (HCC)    Family history of colon cancer    GERD (gastroesophageal reflux disease)    Heart murmur    Hypercalcemia    Hypercholesteremia    Hypertension    Kidney stone    PAF (paroxysmal atrial fibrillation) (HCC)    Personal history of colonic polyps    Pneumonia    S/P TAVR (transcatheter aortic valve replacement) 09/17/2021   s/p TAVR with a 23 mm Edwards S3UR via the TF approach by Dr. Clifton James & Laneta Simmers   Past Surgical History:  Procedure Laterality Date   APPENDECTOMY     BIOPSY  11/03/2019   Benign gastric mucosa with reactive changes and focal inflammation   BIOPSY  03/11/2021   Procedure: BIOPSY;  Surgeon: Lanelle Bal, DO;  [facility]: [facility];  Service: Endoscopy;;   COLONOSCOPY WITH PROPOFOL N/A 02/15/2018   12 polyps ranging in 5 to 20 mm in size were tubular adenoma and recommended repeat exam in 2023   COLONOSCOPY WITH PROPOFOL N/A 03/11/2021   Procedure: COLONOSCOPY WITH PROPOFOL;  Surgeon: Lanelle Bal, DO;  Location: [facility];  Service: Endoscopy;  Laterality: N/A;  9:15am   CORONARY STENT INTERVENTION N/A 07/15/2021   Procedure: CORONARY STENT INTERVENTION;  Surgeon: Kathleene Hazel, MD;  Location: [facility];  Service: Cardiovascular;  Laterality: N/A;   CORONARY STENT PLACEMENT  08/10/2013   ESOPHAGOGASTRODUODENOSCOPY (EGD) WITH PROPOFOL N/A 11/03/2019   normal esophagus, small hiatal hernia, diffuse erythematous mucosa in the entire stomach with scattered erosions.  Status post gastric biopsies for histology.  Surgical pathology found the biopsies to be benign gastric mucosa with reactive changes and focal inflammation, negative for H. Pylori.   FLEXIBLE SIGMOIDOSCOPY N/A 11/03/2019   attempted colonoscopy but inadequate prep   gsw to abd     INTRAOPERATIVE TRANSTHORACIC ECHOCARDIOGRAM N/A 09/17/2021   Procedure: INTRAOPERATIVE TRANSTHORACIC ECHOCARDIOGRAM;  Surgeon: Kathleene Hazel,  MD;  Location: [facility];  Service: Open Heart Surgery;  Laterality: N/A;   LEFT HEART CATHETERIZATION WITH CORONARY ANGIOGRAM N/A 08/10/2013   Procedure: LEFT HEART CATHETERIZATION WITH CORONARY ANGIOGRAM;  Surgeon: Iran Ouch, MD;  Location: [facility] CATH [facility];  Service: Cardiovascular;  Laterality: N/A;   MULTIPLE EXTRACTIONS WITH ALVEOLOPLASTY N/A 08/22/2021   Procedure: MULTIPLE EXTRACTION WITH ALVEOLOPLASTY;  Surgeon: Sharman Cheek, [name];  Location: [facility] OR;  Service: Dentistry;  Laterality: N/A;   PERIPHERAL INTRAVASCULAR LITHOTRIPSY  07/15/2021   Procedure: INTRAVASCULAR LITHOTRIPSY;  Surgeon: Kathleene Hazel, MD;  Location: [facility];  Service: Cardiovascular;;   POLYPECTOMY  02/15/2018   Procedure: POLYPECTOMY;  Surgeon: Corbin Ade, MD;  [facility];  Service: Endoscopy;;  colon   POLYPECTOMY  03/11/2021   Procedure: POLYPECTOMY;  Surgeon: Lanelle Bal, DO;  [facility];  Service: Endoscopy;;   RIGHT/LEFT HEART CATH AND CORONARY ANGIOGRAPHY N/A 07/15/2021   Procedure: RIGHT/LEFT HEART CATH AND CORONARY ANGIOGRAPHY;  Surgeon: Kathleene Hazel, MD;  [facility]: [facility];  Service: Cardiovascular;  Laterality: N/A;   TRANSCATHETER AORTIC VALVE REPLACEMENT, TRANSFEMORAL N/A 09/17/2021   Procedure: Transcatheter Aortic Valve Replacement, Transfemoral;  Surgeon: Kathleene Hazel, MD;  Location: [facility];  Service: Open Heart Surgery;  Laterality: N/A;   Social History:  reports that he quit smoking about 17 years ago. His smoking use included cigarettes. He started smoking about 60 years ago. He has a 135 pack-year smoking history. He quit smokeless tobacco use about 16 years ago. He reports that he does not currently use alcohol. He reports that he does not use drugs.  No Known Allergies  Family History  Problem Relation Age of Onset   Diabetes Mother    Heart attack Mother 60   Pulmonary embolism  Father    Colon cancer Brother 82       Passed age 67 from colon ca   Gastric cancer Neg Hx    Esophageal cancer Neg Hx     Prior to Admission medications   Medication Sig Start Date End Date Taking? Authorizing Provider  alendronate (FOSAMAX) 70 MG tablet Take 1 tablet (70 mg total) by mouth every 7 (seven) days. Take with a full glass of water on an empty stomach. Patient taking differently: Take 70 mg by mouth every 7 (seven) days. Take with a full glass of water on an empty stomach. Taken Saturday/Sunday 04/15/21  Yes Nida, Denman George, MD  alfuzosin (UROXATRAL) 10 MG 24 hr tablet Take 10 mg by mouth daily. 07/26/22  Yes [provider]  amLODipine (NORVASC) 5 MG tablet TAKE 1 TABLET(5 MG) BY MOUTH DAILY Patient taking differently: Take 5 mg by mouth every evening. 11/30/17  Yes Laqueta Linden, MD  apixaban (ELIQUIS) 5 MG TABS tablet TAKE 1 TABLET(5 MG) BY MOUTH TWICE  DAILY 01/31/22  Yes Wendall Stade, MD  Ascorbic Acid (VITAMIN C) 1000 MG tablet Take 1,000 mg by mouth every morning.   Yes [provider]  Cholecalciferol (VITAMIN D3) 125 MCG (5000 UT) TABS Take 5,000 Units by mouth in the morning.   Yes [provider]  clopidogrel (PLAVIX) 75 MG tablet Take 1 tablet (75 mg total) by mouth daily. 08/07/21 08/07/22 Yes Kathleene Hazel, MD  Coenzyme Q10 (COQ10) 100 MG CAPS Take 100 mg by mouth in the morning.   Yes [provider]  FARXIGA 10 MG TABS tablet Take 10 mg by mouth in the morning. 12/01/17  Yes [provider]  finasteride (PROSCAR) 5 MG tablet Take 1 tablet (5 mg total) by mouth daily. 04/28/22  Yes McKenzie, Mardene Celeste, MD  glipiZIDE (GLUCOTROL) 10 MG tablet Take 10 mg by mouth 2 (two) times daily. 08/02/13  Yes [provider]  Glucosamine-Chondroitin (MOVE FREE PO) Take 1 tablet by mouth in the morning and at bedtime.   Yes [provider]  LEVEMIR FLEXTOUCH 100 UNIT/ML Pen Inject 90 Units into the  skin at bedtime.  07/08/16  Yes [provider]  lisinopril (ZESTRIL) 5 MG tablet Take 5 mg by mouth every evening.   Yes [provider]  Magnesium 400 MG TABS Take 400 mg by mouth in the morning.   Yes [provider]  Menatetrenone (VITAMIN K2) 100 MCG TABS Take 100 mcg by mouth in the morning.   Yes [provider]  metFORMIN (GLUCOPHAGE) 1000 MG tablet Take 1,000 mg by mouth 2 (two) times daily. 09/14/14  Yes [provider]  metFORMIN (GLUCOPHAGE-XR) 500 MG 24 hr tablet Take 500 mg by mouth every evening. Take with 1000 Metformin   Yes [provider]  metoprolol tartrate (LOPRESSOR) 25 MG tablet Take 1 tablet (25 mg total) by mouth 2 (two) times daily. 09/23/21  Yes Cyndi Bender, NP  nitroGLYCERIN (NITROSTAT) 0.4 MG SL tablet Place 1 tablet (0.4 mg total) under the tongue every 5 (five) minutes x 3 doses as needed for chest pain (if no relief after 2nd dose, proceed to the ED for an evalution or call 911). 07/02/20  Yes Netta Neat., NP  pantoprazole (PROTONIX) 40 MG tablet Take 1 tablet (40 mg total) by mouth every evening. 02/18/22  Yes Rourk, Gerrit Friends, MD  potassium citrate (UROCIT-K) 5 MEQ (540 MG) SR tablet Take 1 tablet (5 mEq total) by mouth 3 (three) times daily with meals. 04/28/22  Yes McKenzie, Mardene Celeste, MD  silodosin (RAPAFLO) 8 MG CAPS capsule Take 1 capsule (8 mg total) by mouth daily with breakfast. 04/28/22  Yes McKenzie, Mardene Celeste, MD  tamsulosin (FLOMAX) 0.4 MG CAPS capsule Take 0.4 mg by mouth daily. 06/24/22  Yes [provider]  trolamine salicylate (BLUE-EMU HEMP) 10 % cream Apply 1 application topically as needed for muscle pain.   Yes [provider]  zinc gluconate 50 MG tablet Take 50 mg by mouth in the morning.   Yes [provider]  ACCU-CHEK GUIDE test strip  05/21/21   [provider]  Accu-Chek Softclix Lancets lancets 1 each 3 (three) times daily. 05/20/21   [provider]  furosemide (LASIX) 20 MG tablet Take 20 mg by mouth 2 (two) times daily.  Patient not taking: Reported on 08/04/2022 10/11/14   [provider]  HYDROcodone-acetaminophen (NORCO) 10-325 MG tablet Take 1 tablet by mouth every 6 (six) hours as needed  for moderate pain.    [provider]  tiZANidine (ZANAFLEX) 4 MG tablet Take 4 mg by mouth every 6 (six) hours as needed. Patient not taking: Reported on 08/04/2022 07/07/22   [provider]  vitamin B-12 (CYANOCOBALAMIN) 1000 MCG tablet Take 1,000 mcg by mouth in the morning.    [provider]    Physical Exam: Vitals:   08/04/22 1954 08/04/22 1955 08/04/22 2015 08/04/22 2023  BP: 138/70     Pulse: 69   64  Resp: 20     Temp: 98 F (36.7 C)     TempSrc: Oral     SpO2: 100%  95% 95%  Weight:  104.6 kg    Height:  5\' 9"  (1.753 m)     1.  General: Patient lying supine in bed,  no acute distress   2. Psychiatric: Alert and oriented x 3, mood and behavior normal for situation, pleasant and cooperative with exam   3. Neurologic: Speech and language are normal, face is symmetric, moves all 4 extremities voluntarily, at baseline without acute deficits on limited exam   4. HEENMT:  Head is atraumatic, normocephalic, pupils reactive to light, neck is supple, trachea is midline, mucous membranes are moist   5. Respiratory : Lungs are clear to auscultation bilaterally without wheezing, rhonchi, rales, no cyanosis, no increase in work of breathing or accessory muscle use   6. Cardiovascular : Heart rate normal, rhythm is regular, murmur present, rubs or gallops, no peripheral edema, peripheral pulses palpated   7. Gastrointestinal:  Abdomen is soft, nondistended, nontender to palpation bowel sounds active, no masses or organomegaly palpated   8. Skin:  Skin is warm, dry and intact without rashes, acute lesions, or ulcers on limited exam   9.Musculoskeletal:  No acute deformities or trauma, no  asymmetry in tone, no peripheral edema, peripheral pulses palpated, no tenderness to palpation in the extremities  Data Reviewed: In the ED Afebrile, heart rate normal, respiratory rate normal, blood pressure little soft at 100/47, satting 93-95% Increasing troponin from 27-76 No leukocytosis with white blood cell count of 8.2, hemoglobin 14.3 Slight hyponatremia at 133, normal potassium Creatinine slightly elevated at 1.63 EKG shows a heart rate of 86, sinus rhythm, QTc 482 Chest x-ray shows low lung volumes Cardiology consulted and recommended patient be admitted with plan for echo in the a.m.   Assessment and Plan: * Elevated troponin - Troponin initially 27 and then 76 - Denies any chest pain, palpitations, dyspnea - Reports "not feeling well" but cannot describe it - Cardiology and noted heart rate to be in the 140s at cardiac rehab and sent patient to the ER - Cardiology consulted and wants echo in the a.m. and inpatient cardiology consult in the a.m. - Echo ordered - Trend troponin - Significant cardiac history with coronary artery disease, stents, TAVR - Monitor on telemetry  AKI (acute kidney injury) (HCC) - Creatinine 1.3 at baseline - Creatinine 1.63 today - Patient reports drinking 6 bottles of water per day as recommended by his nephrologist - Patient given a 500 mL bolus in the ED - Hold nephrotoxic agents when possible - Trend in the a.m.  Atrial fibrillation with RVR (HCC) - Continue Lopressor and Eliquis  Coronary artery disease - Continue Lopressor and Plavix - Holding ACE  HTN (hypertension) - Continue Lopressor - Holding lisinopril in the setting of AKI  DM (diabetes mellitus) (HCC) - Glucose 257 in the ED - Monitor CBGs - Sliding scale coverage  Hyperlipidemia Resume co-Q10 at discharge      Advance Care Planning:   Code Status: Full Code  Consults: Cardiology  Family Communication: No family at bedside  Severity of Illness: The  appropriate patient status for this patient is OBSERVATION. Observation status is judged to be reasonable and necessary in order to provide the required intensity of service to ensure the patient's safety. The patient's presenting symptoms, physical exam findings, and initial radiographic and laboratory data in the context of their medical condition is felt to place them at decreased risk for further clinical deterioration. Furthermore, it is anticipated that the patient will be medically stable for discharge from the hospital within 2 midnights of admission.   Author: Lilyan Gilford, DO 08/04/2022 10:14 PM  For on call review [email or web address].

## 2022-08-04 NOTE — Assessment & Plan Note (Signed)
-   Creatinine 1.3 at baseline - Creatinine 1.63 today - Patient reports drinking 6 bottles of water per day as recommended by his nephrologist - Patient given a 500 mL bolus in the ED - Hold nephrotoxic agents when possible - Trend in the a.m.

## 2022-08-05 ENCOUNTER — Observation Stay (HOSPITAL_BASED_OUTPATIENT_CLINIC_OR_DEPARTMENT_OTHER): Payer: PPO

## 2022-08-05 ENCOUNTER — Other Ambulatory Visit (HOSPITAL_COMMUNITY): Payer: Self-pay | Admitting: *Deleted

## 2022-08-05 DIAGNOSIS — R7989 Other specified abnormal findings of blood chemistry: Secondary | ICD-10-CM | POA: Diagnosis not present

## 2022-08-05 DIAGNOSIS — I4891 Unspecified atrial fibrillation: Secondary | ICD-10-CM

## 2022-08-05 DIAGNOSIS — R778 Other specified abnormalities of plasma proteins: Secondary | ICD-10-CM

## 2022-08-05 LAB — GLUCOSE, CAPILLARY
Glucose-Capillary: 160 mg/dL — ABNORMAL HIGH (ref 70–99)
Glucose-Capillary: 302 mg/dL — ABNORMAL HIGH (ref 70–99)

## 2022-08-05 LAB — TROPONIN I (HIGH SENSITIVITY)
Troponin I (High Sensitivity): 178 ng/L (ref <18)
Troponin I (High Sensitivity): 164 ng/L (ref <18)

## 2022-08-05 MED ORDER — ROSUVASTATIN CALCIUM 20 MG PO TABS: 20.0000 mg | ORAL_TABLET | Freq: Every day | ORAL | 0 refills | Status: AC

## 2022-08-05 MED ORDER — METOPROLOL TARTRATE 25 MG PO TABS: 37.5000 mg | ORAL_TABLET | Freq: Two times a day (BID) | ORAL | Status: DC

## 2022-08-05 MED ORDER — ROSUVASTATIN CALCIUM 20 MG PO TABS
20.0000 mg | ORAL_TABLET | Freq: Every day | ORAL | Status: DC
  Administered 2022-08-05: 20 mg via ORAL
  Filled 2022-08-05: qty 1

## 2022-08-05 MED ORDER — METOPROLOL TARTRATE 37.5 MG PO TABS: 37.5000 mg | ORAL_TABLET | Freq: Two times a day (BID) | ORAL | 0 refills | Status: DC

## 2022-08-05 NOTE — Consult Note (Addendum)
Cardiology Consultation   Patient ID: AVEN ESSA MRN: [id number]; DOB: [birth year]  Admit date: 08/04/2022 Date of Consult: 08/05/2022  PCP:  William Nevins, MD   [facility] Providers Cardiologist:  Charlton Haws, MD  Cardiology APP:  Dyann Kief, PA-C  {  Patient Profile:   William [name] is a 77 y.o. male with a hx of CAD (s/p DES to distal RCA in 08/2013, PCI/DES to LAD in 07/2021), aortic stenosis (s/p TAVR in 09/2021), paroxysmal atrial fibrillation, HTN, HLD and Type 2 DM who is being seen 08/05/2022 for the evaluation of elevated troponin values at the request of Dr. Carren Rang.  History of Present Illness:   William [name] was last examined by myself in 04/2022 following a recent Emergency Department visit for elevated heart rates and episode of dizziness. He reported overall feeling well at the time of his visit and denied any recurrent chest pain or palpitations.  His monitor did showed an atrial fibrillation burden of 1% and he was already on Eliquis for anticoagulation. Was continued on Lopressor 25 mg twice daily for rate-control.  He was at Cardiac Rehab on 08/04/2022 when he was found to be tachycardic again with heart rate in the 140's. He reported not feeling well over the past 2 hours prior to this. Was sent to the ED for further evaluation and HR had improved into the 80's upon arrival.  EKG while in the ED showed normal sinus rhythm, heart rate 86 with known LBBB.  Initial labs showed WBC 8.2, Hgb 14.3, platelets 190, Na+ 133, K+ 4.4 and creatinine 1.63 (baseline ~ 1.3). Mg 1.9. Initial and repeat Hs Troponin values at 25, 76, 158, 180, 178 and 164. CXR showing low-lung volumes with no focal airspace disease.   In talking with the patient today, he reports having significant fatigue yesterday at the time his HR was found to be elevated. Denies any associated chest pain or palpitations at that time. No recent orthopnea, PND or pitting edema. He reports  feeling well overnight and is anxious to go home today. When asked about his usual routine, he says he is typically active at baseline and does not experience anginal symptoms with doing yard work or chores around the house. Says he has been staying hydrated and consumes a least 4-5 bottles of water daily. When asked about caffeine intake, he does not consume soda or tea but does consume 2 cups of coffee daily and reports these cups are 15 ounces each.  Past Medical History:  Diagnosis Date   Anxiety    Aortic stenosis    Arthritis    CAD (coronary artery disease)    a. s/p DES to distal RCA in 08/2013, DES to LAD 07/2021   Cancer [facility])    prostate   CKD (chronic kidney disease)    Diabetes mellitus without complication (HCC)    Family history of colon cancer    GERD (gastroesophageal reflux disease)    Heart murmur    Hypercalcemia    Hypercholesteremia    Hypertension    Kidney stone    PAF (paroxysmal atrial fibrillation) (HCC)    Personal history of colonic polyps    Pneumonia    S/P TAVR (transcatheter aortic valve replacement) 09/17/2021   s/p TAVR with a 23 mm Edwards S3UR via the TF approach by Dr. Gevena Cotton    Past Surgical History:  Procedure Laterality Date   APPENDECTOMY     BIOPSY  11/03/2019  Benign gastric mucosa with reactive changes and focal inflammation   BIOPSY  03/11/2021   Procedure: BIOPSY;  Surgeon: Lanelle Bal, DO;  [facility];  Service: Endoscopy;;   COLONOSCOPY WITH PROPOFOL N/A 02/15/2018   12 polyps ranging in 5 to 20 mm in size were tubular adenoma and recommended repeat exam in 2023   COLONOSCOPY WITH PROPOFOL N/A 03/11/2021   Procedure: COLONOSCOPY WITH PROPOFOL;  Surgeon: Lanelle Bal, DO;  [facility]: [facility];  Service: Endoscopy;  Laterality: N/A;  9:15am   CORONARY STENT INTERVENTION N/A 07/15/2021   Procedure: CORONARY STENT INTERVENTION;  Surgeon: Kathleene Hazel, MD;  Location: [facility] INVASIVE CV LAB;   Service: Cardiovascular;  Laterality: N/A;   CORONARY STENT PLACEMENT  08/10/2013   ESOPHAGOGASTRODUODENOSCOPY (EGD) WITH PROPOFOL N/A 11/03/2019   normal esophagus, small hiatal hernia, diffuse erythematous mucosa in the entire stomach with scattered erosions.  Status post gastric biopsies for histology.  Surgical pathology found the biopsies to be benign gastric mucosa with reactive changes and focal inflammation, negative for H. Pylori.   FLEXIBLE SIGMOIDOSCOPY N/A 11/03/2019   attempted colonoscopy but inadequate prep   gsw to abd     INTRAOPERATIVE TRANSTHORACIC ECHOCARDIOGRAM N/A 09/17/2021   Procedure: INTRAOPERATIVE TRANSTHORACIC ECHOCARDIOGRAM;  Surgeon: Kathleene Hazel, MD;  [facility]: [facility] INV[facility] CV [facility];  Service: Open Heart Surgery;  Laterality: N/A;   LEFT HEART CATHETERIZATION WITH CORONARY ANGIOGRAM N/A 08/10/2013   Procedure: LEFT HEART CATHETERIZATION WITH CORONARY ANGIOGRAM;  Surgeon: Iran Ouch, MD;  Location: [facility] CATH LAB;  Service: Cardiovascular;  Laterality: N/A;   MULTIPLE EXTRACTIONS WITH ALVEOLOPLASTY N/A 08/22/2021   Procedure: MULTIPLE EXTRACTION WITH ALVEOLOPLASTY;  Surgeon: Sharman Cheek, [name];  Location: [facility] OR;  Service: Dentistry;  Laterality: N/A;   PERIPHERAL INTRAVASCULAR LITHOTRIPSY  07/15/2021   Procedure: INTRAVASCULAR LITHOTRIPSY;  Surgeon: Kathleene Hazel, MD;  [facility];  Service: Cardiovascular;;   POLYPECTOMY  02/15/2018   Procedure: POLYPECTOMY;  Surgeon: Corbin Ade, MD;  [facility];  Service: Endoscopy;;  colon   POLYPECTOMY  03/11/2021   Procedure: POLYPECTOMY;  Surgeon: Lanelle Bal, DO;  [facility];  Service: Endoscopy;;   RIGHT/LEFT HEART CATH AND CORONARY ANGIOGRAPHY N/A 07/15/2021   Procedure: RIGHT/LEFT HEART CATH AND CORONARY ANGIOGRAPHY;  Surgeon: Kathleene Hazel, MD;  [facility];  Service: Cardiovascular;  Laterality: N/A;   TRANSCATHETER AORTIC  VALVE REPLACEMENT, TRANSFEMORAL N/A 09/17/2021   Procedure: Transcatheter Aortic Valve Replacement, Transfemoral;  Surgeon: Kathleene Hazel, MD;  [facility];  Service: Open Heart Surgery;  Laterality: N/A;     Home Medications:  Prior to Admission medications   Medication Sig Start Date End Date Taking? Authorizing Provider  alendronate (FOSAMAX) 70 MG tablet Take 1 tablet (70 mg total) by mouth every 7 (seven) days. Take with a full glass of water on an empty stomach. Patient taking differently: Take 70 mg by mouth every 7 (seven) days. Take with a full glass of water on an empty stomach. Taken Saturday/Sunday 04/15/21  Yes Nida, Denman George, MD  alfuzosin (UROXATRAL) 10 MG 24 hr tablet Take 10 mg by mouth daily. 07/26/22  Yes [provider]  amLODipine (NORVASC) 5 MG tablet TAKE 1 TABLET(5 MG) BY MOUTH DAILY Patient taking differently: Take 5 mg by mouth every evening. 11/30/17  Yes Laqueta Linden, MD  apixaban (ELIQUIS) 5 MG TABS tablet TAKE 1 TABLET(5 MG) BY MOUTH  TWICE DAILY 01/31/22  Yes Wendall Stade, MD  Ascorbic Acid (VITAMIN C) 1000 MG tablet Take 1,000 mg by mouth every morning.   Yes [provider]  Cholecalciferol (VITAMIN D3) 125 MCG (5000 UT) TABS Take 5,000 Units by mouth in the morning.   Yes [provider]  clopidogrel (PLAVIX) 75 MG tablet Take 1 tablet (75 mg total) by mouth daily. 08/07/21 08/07/22 Yes Kathleene Hazel, MD  Coenzyme Q10 (COQ10) 100 MG CAPS Take 100 mg by mouth in the morning.   Yes [provider]  FARXIGA 10 MG TABS tablet Take 10 mg by mouth in the morning. 12/01/17  Yes [provider]  finasteride (PROSCAR) 5 MG tablet Take 1 tablet (5 mg total) by mouth daily. 04/28/22  Yes McKenzie, Mardene Celeste, MD  glipiZIDE (GLUCOTROL) 10 MG tablet Take 10 mg by mouth 2 (two) times daily. 08/02/13  Yes [provider]  Glucosamine-Chondroitin (MOVE FREE PO) Take 1 tablet by  mouth in the morning and at bedtime.   Yes [provider]  LEVEMIR FLEXTOUCH 100 UNIT/ML Pen Inject 90 Units into the skin at bedtime.  07/08/16  Yes [provider]  lisinopril (ZESTRIL) 5 MG tablet Take 5 mg by mouth every evening.   Yes [provider]  Magnesium 400 MG TABS Take 400 mg by mouth in the morning.   Yes [provider]  Menatetrenone (VITAMIN K2) 100 MCG TABS Take 100 mcg by mouth in the morning.   Yes [provider]  metFORMIN (GLUCOPHAGE) 1000 MG tablet Take 1,000 mg by mouth 2 (two) times daily. 09/14/14  Yes [provider]  metFORMIN (GLUCOPHAGE-XR) 500 MG 24 hr tablet Take 500 mg by mouth every evening. Take with 1000 Metformin   Yes [provider]  metoprolol tartrate (LOPRESSOR) 25 MG tablet Take 1 tablet (25 mg total) by mouth 2 (two) times daily. 09/23/21  Yes Cyndi Bender, NP  nitroGLYCERIN (NITROSTAT) 0.4 MG SL tablet Place 1 tablet (0.4 mg total) under the tongue every 5 (five) minutes x 3 doses as needed for chest pain (if no relief after 2nd dose, proceed to the ED for an evalution or call 911). 07/02/20  Yes Netta Neat., NP  pantoprazole (PROTONIX) 40 MG tablet Take 1 tablet (40 mg total) by mouth every evening. 02/18/22  Yes Rourk, Gerrit Friends, MD  potassium citrate (UROCIT-K) 5 MEQ (540 MG) SR tablet Take 1 tablet (5 mEq total) by mouth 3 (three) times daily with meals. 04/28/22  Yes McKenzie, Mardene Celeste, MD  silodosin (RAPAFLO) 8 MG CAPS capsule Take 1 capsule (8 mg total) by mouth daily with breakfast. 04/28/22  Yes McKenzie, Mardene Celeste, MD  tamsulosin (FLOMAX) 0.4 MG CAPS capsule Take 0.4 mg by mouth daily. 06/24/22  Yes [provider]  trolamine salicylate (BLUE-EMU HEMP) 10 % cream Apply 1 application topically as needed for muscle pain.   Yes [provider]  zinc gluconate 50 MG tablet Take 50 mg by mouth in the morning.   Yes [provider]  ACCU-CHEK GUIDE test strip   05/21/21   [provider]  Accu-Chek Softclix Lancets lancets 1 each 3 (three) times daily. 05/20/21   [provider]  furosemide (LASIX) 20 MG tablet Take 20 mg by mouth 2 (two) times daily.  Patient not taking: Reported on 08/04/2022 10/11/14   [provider]  HYDROcodone-acetaminophen (NORCO) 10-325 MG tablet Take 1 tablet by mouth every 6 (six) hours as  needed for moderate pain.    [provider]  tiZANidine (ZANAFLEX) 4 MG tablet Take 4 mg by mouth every 6 (six) hours as needed. Patient not taking: Reported on 08/04/2022 07/07/22   [provider]  vitamin B-12 (CYANOCOBALAMIN) 1000 MCG tablet Take 1,000 mcg by mouth in the morning.    [provider]    Inpatient Medications: Scheduled Meds:  apixaban  5 mg Oral BID   clopidogrel  75 mg Oral Daily   insulin aspart  0-15 Units Subcutaneous TID WC   insulin aspart  0-5 Units Subcutaneous QHS   metoprolol tartrate  25 mg Oral BID   pantoprazole  40 mg Oral QPM   tamsulosin  0.4 mg Oral QPC supper   Continuous Infusions:  PRN Meds: acetaminophen **OR** acetaminophen, ondansetron **OR** ondansetron (ZOFRAN) IV, oxyCODONE  Allergies:   No Known Allergies  Social History:   Social History   Socioeconomic History   Marital status: Widowed    Spouse name: Not on file   Number of children: 5   Years of education: Not on file   Highest education level: Not on file  Occupational History   Occupation: Retired Personnel officer  Tobacco Use   Smoking status: Former    Current packs/day: 0.00    Average packs/day: 3.0 packs/day for 45.0 years (135.0 ttl pk-yrs)    Types: Cigarettes    Start date: 01/06/1962    Quit date: 10/24/2004    Years since quitting: 17.7   Smokeless tobacco: Former    Quit date: 08/10/2005  Vaping Use   Vaping status: Never Used  Substance and Sexual Activity   Alcohol use: Not Currently    Comment: occasional   Drug use: No   Sexual activity: Not  Currently  Other Topics Concern   Not on file  Social History Narrative   Not on file   Social Determinants of Health   Financial Resource Strain: Not on file  Food Insecurity: No Food Insecurity (08/04/2022)   Hunger Vital Sign    Worried About Running Out of Food in the Last Year: Never true    Ran Out of Food in the Last Year: Never true  Transportation Needs: No Transportation Needs (08/04/2022)   PRAPARE - Administrator, Civil Service (Medical): No    Lack of Transportation (Non-Medical): No  Physical Activity: Not on file  Stress: Not on file  Social Connections: Not on file  Intimate Partner Violence: Not At Risk (08/04/2022)   Humiliation, Afraid, Rape, and Kick questionnaire    Fear of Current or Ex-Partner: No    Emotionally Abused: No    Physically Abused: No    Sexually Abused: No    Family History:    Family History  Problem Relation Age of Onset   Diabetes Mother    Heart attack Mother 97   Pulmonary embolism Father    Colon cancer Brother 62       Passed age 23 from colon ca   Gastric cancer Neg Hx    Esophageal cancer Neg Hx      ROS:  Please see the history of present illness.   All other ROS reviewed and negative.     Physical Exam/Data:   Vitals:   08/04/22 2015 08/04/22 2023 08/05/22 0008 08/05/22 0329  BP:   (!) 116/50 (!) 118/54  Pulse:  64 61 62  Resp:   16 18  Temp:   97.7 F (36.5 C) 98.1 F (36.7  C)  TempSrc:   Oral Oral  SpO2: 95% 95% 91% 95%  Weight:      Height:       No intake or output data in the 24 hours ending 08/05/22 0756    08/04/2022    7:55 PM 08/04/2022    1:43 PM 04/23/2022    2:15 PM  Last 3 Weights  Weight (lbs) 230 lb 9.6 oz 225 lb 229 lb 6.4 oz  Weight (kg) 104.6 kg 102.059 kg 104.055 kg     Body mass index is 34.05 kg/m.  General:  Well nourished, well developed male appearing in no acute distress. HEENT: normal Neck: no JVD Vascular: No carotid bruits; Distal pulses 2+ bilaterally Cardiac:   normal S1, S2; RRR; 2/6 systolic murmur along RUSB. Lungs:  clear to auscultation bilaterally, no wheezing, rhonchi or rales  Abd: soft, nontender, no hepatomegaly  Ext: no pitting edema Musculoskeletal:  No deformities, BUE and BLE strength normal and equal Skin: warm and dry  Neuro:  CNs 2-12 intact, no focal abnormalities noted Psych:  Normal affect   EKG:  The EKG was personally reviewed and demonstrates: NSR, HR 86 with known LBBB Telemetry:  Telemetry was personally reviewed and demonstrates: NSR, HR in 50's to 60's.   Relevant CV Studies:  Cardiac Catheterization: 07/2021   Prox RCA lesion is 100% stenosed.   Prox Cx to Mid Cx lesion is 40% stenosed.   Dist Cx lesion is 50% stenosed.   2nd Mrg lesion is 40% stenosed.   Mid LAD-1 lesion is 95% stenosed.   Mid LAD-2 lesion is 50% stenosed.   1st Diag lesion is 50% stenosed.   Previously placed Dist RCA stent of unknown type is  widely patent.   A drug-eluting stent was successfully placed using a SYNERGY XD [id number].   Post intervention, there is a 0% residual stenosis.   Severe mid LAD stenosis with heavy calcification. Moderate mid LAD stenosis. Moderate diagonal stenosis Successful PTCA/DES x 1 mid LAD with intracoronary lithotripsy prior to stent placement Moderate mid Circumflex stenosis. Moderate stenosis OM3 Large dominant RCA with total occlusion (CTO) of the mid RCA. The distal RCA fills from left to right collaterals.  RA 6, RV 47/13/14, PA 35/15 mean 29, PCWP 9, AO 116/52   Recommendations: Will continue ASA and Plavix for at least six months. Will continue workup for TAVR.   Echocardiogram: 10/2021 IMPRESSIONS     1. Left ventricular ejection fraction, by estimation, is 60 to 65%. The  left ventricle has normal function. The left ventricle has no regional  wall motion abnormalities. There is moderate concentric left ventricular  hypertrophy. Left ventricular  diastolic parameters are consistent with Grade II  diastolic dysfunction  (pseudonormalization).   2. Right ventricular systolic function is normal. The right ventricular  size is normal. Tricuspid regurgitation signal is inadequate for assessing  PA pressure.   3. Left atrial size was moderately dilated.   4. The mitral valve is abnormal. Mild to moderate mitral valve  regurgitation. Mild mitral stenosis. The mean mitral valve gradient is 4.0  mmHg at HR 63 bpm. Moderate mitral annular calcification.   5. The aortic valve has been repaired/replaced. Aortic valve  regurgitation is not visualized. There is a 23 mm Edwards S3UR valve  present in the aortic position. Aortic valve mean gradient measures 15.0  mmHg.   6. The inferior vena cava is dilated in size with <50% respiratory  variability, suggesting right atrial pressure of 15 mmHg.  Comparison(s): Prior images reviewed side by side. TAVR in position  without paravalvular leak. Mean gradient relatively stable at 15 mmHg  (previously 17 mmHg).    Event Monitor: 04/2022 Patch Wear Time:  13 days and 23 hours (2024-03-14T17:42:16-0400 to 2024-03-28T17:32:36-0400)   Patient had a min HR of 51 bpm, max HR of 184 bpm, and avg HR of 71 bpm. Predominant underlying rhythm was Sinus Rhythm. Bundle Danna Sewell Block/IVCD was present. QRS morphology changes were present throughout recording. 1 run of Ventricular Tachycardia  occurred lasting 4 beats with a max rate of 184 bpm (avg 158 bpm). 1 run of Supraventricular Tachycardia occurred lasting 9 beats with a max rate of 102 bpm (avg 93 bpm). Atrial Fibrillation occurred (1% burden), ranging from 68-153 bpm (avg of 98 bpm),  the longest lasting 5 hours 0 mins with an avg rate of 98 bpm. Isolated SVEs were rare (<1.0%), SVE Couplets were rare (<1.0%), and SVE Triplets were rare (<1.0%). Isolated VEs were occasional (1.9%, 26299), VE Couplets were rare (<1.0%, 388), and VE  Triplets were rare (<1.0%, 3). Ventricular Bigeminy and Trigeminy were  present.  Laboratory Data:  High Sensitivity Troponin:   Recent Labs  Lab 08/04/22 1623 08/04/22 1907 08/04/22 2103 08/05/22 0327 08/05/22 0441  TROPONINIHS 76* 158* 180* 178* 164*     Chemistry Recent Labs  Lab 08/04/22 1412 08/05/22 0327  NA 133* 136  K 4.4 4.2  CL 103 107  CO2 25 26  GLUCOSE 257* 187*  BUN 29* 27*  CREATININE 1.63* 1.61*  CALCIUM 9.8 9.7  MG 1.9 1.9  GFRNONAA 44* 44*  ANIONGAP 5 3*    Recent Labs  Lab 08/05/22 0327  PROT 6.1*  ALBUMIN 3.4*  AST 16  ALT 17  ALKPHOS 29*  BILITOT 1.0   Lipids No results for input(s): "CHOL", "TRIG", "HDL", "LABVLDL", "LDLCALC", "CHOLHDL" in the last 168 hours.  Hematology Recent Labs  Lab 08/04/22 1412 08/05/22 0327  WBC 8.2 7.4  RBC 4.49 4.19*  HGB 14.3 13.1  HCT 43.3 40.0  MCV 96.4 95.5  MCH 31.8 31.3  MCHC 33.0 32.8  RDW 13.9 13.8  PLT 190 174   Thyroid No results for input(s): "TSH", "FREET4" in the last 168 hours.  BNPNo results for input(s): "BNP", "PROBNP" in the last 168 hours.  DDimer No results for input(s): "DDIMER" in the last 168 hours.   Radiology/Studies:  DG Chest Port 1 View  Result Date: 08/04/2022 CLINICAL DATA:  weakness EXAM: PORTABLE CHEST 1 VIEW COMPARISON:  CXR 03/17/22 FINDINGS: No pleural effusion. No pneumothorax. No focal airspace opacity. Normal cardiac and mediastinal contours. Low lung volumes. No radiographically apparent displaced rib fractures. Visualized upper abdomen is unremarkable IMPRESSION: Low lung volumes. No focal airspace opacity. Electronically Signed   By: Lorenza Cambridge M.D.   On: 08/04/2022 15:00     Assessment and Plan:   1. Paroxysmal Atrial Fibrillation with RVR - This has been a recurrent issue for the patient and recent monitor in 04/2022 showed a 1% atrial fibrillation burden. He has been back in normal sinus rhythm overnight and this morning and denies any recurrent symptoms. Na+ was slightly low at 133 on admission but normalized to 136 today.  K+ 4.2 and Mg 1.9. TSH normal at 1.620 when checked in 02/2022. - He is on Lopressor 25 mg twice daily but unfortunately we are unable to further titrate this given his heart rate in the 50's at this time. He also has underlying conduction disease as  he has a known LBBB. Would consider EP referral for consideration of antiarrhythmics as an outpatient given the inability to titrate AV nodal blocking agents. - He has also been consuming at least 30 ounces of regular coffee daily and we reviewed the importance of reducing his caffeine intake.  2. CAD/Elevated Troponin Values - He is s/p DES to distal RCA in 08/2013 and did have PCI/DES to the LAD in 07/2021. Hs troponin values have been elevated at 25, 76, 158, 180, 178 and 164 this admission which is likely secondary to demand ischemia in the setting of atrial fibrillation with RVR. He denies any recent anginal symptoms. Repeat echocardiogram is pending. - Continue Lopressor 25 mg twice daily and Crestor 20 mg daily. He has been on Plavix 75 mg daily but given that he is a year out from intervention, this can likely be stopped as he is also on Eliquis.  3. Aortic Stenosis  - He is s/p TAVR in 09/2021. Echocardiogram in 10/2021 showed normal function of his TAVR. Repeat echocardiogram is pending.  4. HTN - BP has been well-controlled, at 118/54 on most recent check. Continue Lopressor 25 mg twice daily. He is on Amlodipine 5 mg daily and Lisinopril 5 mg daily at home.  Given his elevated creatinine, Lisinopril has been held and Amlodipine currently held due to soft BP.   5. HLD - His LDL was at 60 in 02/2022.  Will reorder his PTA Crestor 20 mg daily.  6.  Acute on Chronic Stage III CKD - Baseline creatinine ~ 1.3. Elevated to 1.63 on admission and similar at 1.61 today.  He did receive a 500 mL fluid bolus while in the ED and may benefit from additional fluids today. Continue to hold Lisinopril and Farxiga for now. Was listed as taking Lasix 20 mg  twice daily but reported not taking this prior to admission.   For questions or updates, please contact [facility] Please consult [email or web address] for contact info under    Signed, Ellsworth Lennox, PA-C  08/05/2022 7:56 AM  Attending note Patient seen and discussed with PA Iran Ouch, I agree with her documentation. 77 yo male history of CAD with prior DES to RCA in 2015, PCI/DES to LAD in 07/2021, aortic stenosis s/p TAVR, PAF, HTN, HLD, DM2 presented from cardiac rehab with tachycardia and fatigue. Was noted to be tachycardic in cardiac rehab, I had initially evaluated him. The limited single lead tele showed rate 150, difficult to discern afib vs aflutter. Patient was sent to ER evaluation, by time evaluated he had self converted to SR.   K 4.4 Cr 1.63 BUN 29 WBC 8.2 Hgb 14.3 Plt 190 Mg 1.9  Trop 25-->76-->158-->180-->178-->164 EKG NSR, chronic LBBB Echo pending   1.PAF - episode of symptomatic afib with RVR in cardiac rehab, self converted by ER evaluation. - self converted by time of ER evaluation, NSR throughout night on tele - he is on lopressor 25mg  bid. Rates here low 60s, from 04/2022 avg rate in the 70s. Increase lopressor to 37.5mg  bid, if issues with low rates over time would need to consider EP eval - continue eliquis for stroke prevention  2. Elevated troponin - suspect rate related in setting of chronic CAD, at this time do not suspect ACS. He has not had any angina.  - f/u echo - no plans for ischemic testing at this time  3. CAD - prior DES to RCA in 2015, PCI/DES to LAD in 07/2021 - stop plavix as over  1 year since intervention and patient is on eliquis  Likely d/c later today after echo  Dina Rich MD

## 2022-08-05 NOTE — Progress Notes (Signed)
   08/05/22 1037  TOC Brief Assessment  Insurance and Status Reviewed  Patient has primary care physician Yes  Home environment has been reviewed from home alone  Prior level of function: independent  Prior/Current Home Services No current home services  Social Determinants of Health Reivew SDOH reviewed no interventions necessary  Readmission risk has been reviewed Yes  Transition of care needs no transition of care needs at this time   [facility] Screen Transition of Care Department [facility]) has reviewed patient and no TOC needs have been identified at this time. We will continue to monitor patient advancement through interdisciplinary progression rounds. If new patient transition needs arise, please place a TOC consult.

## 2022-08-05 NOTE — Discharge Summary (Signed)
Physician Discharge Summary  William [name] VFI:[id number] DOB: Oct 13, 1945 DOA: 08/04/2022  PCP: Elfredia Nevins, MD  Admit date: 08/04/2022  Discharge date: 08/05/2022  Admitted From:Home  Disposition:  Home  Recommendations for Outpatient Follow-up:  Follow up with PCP in 1-2 weeks Follow-up with cardiology as scheduled 8/14 at 3:30 PM Lopressor dose increased from 25 mg to 37.5 mg twice daily for better heart rate control Discontinue further use of Plavix Continue other home medications as prior  Home Health: None  Equipment/Devices: None  Discharge Condition:Stable  CODE STATUS: Full  Diet recommendation: Heart Healthy  Brief/Interim Summary: William [name] is a 77 y.o. male with medical history significant of anxiety, coronary artery disease, CKD, GERD, hypertension, atrial fibrillation, hyperlipidemia, status post TAVR, and more presents to the ED with a chief complaint of "not feeling well."  Patient reports that he just was not feeling well since 10 AM.  He decided to go to his cardiac rehab because he thought that would make him feel better.  When they checked his vitals there they found his heart rate to be 147 and they sent him to the ER.  He was admitted with paroxysmal atrial fibrillation with RVR and self converted to sinus rhythm in the ED.  He was seen by cardiology and Lopressor dose was increased to 37.5 mg twice daily and 2D echocardiogram was done with no acute findings noted.  LVEF 55-60% as noted below.  He has been instructed to stop Plavix as it has been over 1 year since stent placement and patient is on Eliquis.  He will have close follow-up with cardiology outpatient as noted above and has had no other acute events or concerns this admission.  Discharge Diagnoses:  Principal Problem:   Elevated troponin Active Problems:   Hyperlipidemia   DM (diabetes mellitus) (HCC)   HTN (hypertension)   Coronary artery disease   Atrial fibrillation with RVR (HCC)   AKI  (acute kidney injury) (HCC)  Principal discharge diagnosis: Paroxysmal atrial fibrillation with RVR and elevated troponin related to demand ischemia.  Discharge Instructions  Discharge Instructions     Diet - low sodium heart healthy   Complete by: As directed    Increase activity slowly   Complete by: As directed       Allergies as of 08/05/2022   No Known Allergies      Medication List     STOP taking these medications    clopidogrel 75 MG tablet Commonly known as: Plavix   furosemide 20 MG tablet Commonly known as: LASIX       TAKE these medications    Accu-Chek Guide test strip Generic drug: glucose blood   Accu-Chek Softclix Lancets lancets 1 each 3 (three) times daily.   alendronate 70 MG tablet Commonly known as: FOSAMAX Take 1 tablet (70 mg total) by mouth every 7 (seven) days. Take with a full glass of water on an empty stomach. What changed: additional instructions   alfuzosin 10 MG 24 hr tablet Commonly known as: UROXATRAL Take 10 mg by mouth daily.   amLODipine 5 MG tablet Commonly known as: NORVASC TAKE 1 TABLET(5 MG) BY MOUTH DAILY What changed: See the new instructions.   Blue-Emu Hemp 10 % cream Generic drug: trolamine salicylate Apply 1 application topically as needed for muscle pain.   CoQ10 100 MG Caps Take 100 mg by mouth in the morning.   cyanocobalamin 1000 MCG tablet Commonly known as: VITAMIN B12 Take 1,000 mcg by mouth in  the morning.   Eliquis 5 MG Tabs tablet Generic drug: apixaban TAKE 1 TABLET(5 MG) BY MOUTH TWICE DAILY   Farxiga 10 MG Tabs tablet Generic drug: dapagliflozin propanediol Take 10 mg by mouth in the morning.   finasteride 5 MG tablet Commonly known as: PROSCAR Take 1 tablet (5 mg total) by mouth daily.   glipiZIDE 10 MG tablet Commonly known as: GLUCOTROL Take 10 mg by mouth 2 (two) times daily.   HYDROcodone-acetaminophen 10-325 MG tablet Commonly known as: NORCO Take 1 tablet by mouth  every 6 (six) hours as needed for moderate pain.   Levemir FlexTouch 100 UNIT/ML FlexTouch Pen Generic drug: insulin detemir Inject 90 Units into the skin at bedtime.   lisinopril 5 MG tablet Commonly known as: ZESTRIL Take 5 mg by mouth every evening.   Magnesium 400 MG Tabs Take 400 mg by mouth in the morning.   metFORMIN 500 MG 24 hr tablet Commonly known as: GLUCOPHAGE-XR Take 500 mg by mouth every evening. Take with 1000 Metformin   metFORMIN 1000 MG tablet Commonly known as: GLUCOPHAGE Take 1,000 mg by mouth 2 (two) times daily.   Metoprolol Tartrate 37.5 MG Tabs Take 1 tablet (37.5 mg total) by mouth 2 (two) times daily. What changed:  medication strength how much to take   MOVE FREE PO Take 1 tablet by mouth in the morning and at bedtime.   nitroGLYCERIN 0.4 MG SL tablet Commonly known as: NITROSTAT Place 1 tablet (0.4 mg total) under the tongue every 5 (five) minutes x 3 doses as needed for chest pain (if no relief after 2nd dose, proceed to the ED for an evalution or call 911).   pantoprazole 40 MG tablet Commonly known as: PROTONIX Take 1 tablet (40 mg total) by mouth every evening.   potassium citrate 5 MEQ (540 MG) SR tablet Commonly known as: UROCIT-K Take 1 tablet (5 mEq total) by mouth 3 (three) times daily with meals.   rosuvastatin 20 MG tablet Commonly known as: CRESTOR Take 1 tablet (20 mg total) by mouth daily. Start taking on: August 06, 2022   silodosin 8 MG Caps capsule Commonly known as: RAPAFLO Take 1 capsule (8 mg total) by mouth daily with breakfast.   tamsulosin 0.4 MG Caps capsule Commonly known as: FLOMAX Take 0.4 mg by mouth daily.   tiZANidine 4 MG tablet Commonly known as: ZANAFLEX Take 4 mg by mouth every 6 (six) hours as needed.   vitamin C 1000 MG tablet Take 1,000 mg by mouth every morning.   Vitamin D3 125 MCG (5000 UT) Tabs Take 5,000 Units by mouth in the morning.   Vitamin K2 100 MCG Tabs Take 100 mcg by mouth  in the morning.   zinc gluconate 50 MG tablet Take 50 mg by mouth in the morning.        Follow-up Information     Ellsworth Lennox, PA-C Follow up on 08/20/2022.   Specialties: Cardiology, Cardiology Why: [facility]iology [facility] Follow-up on 08/20/2022 at 3:30 PM. Contact information: [address] [place] [address] [phone]         Elfredia Nevins, MD. Schedule an appointment as soon as possible for a visit in 2 week(s).   Specialty: Internal Medicine Contact information: [address] [place] [address] [phone]                No Known Allergies  Consultations: Cardiology   Procedures/Studies: ECHOCARDIOGRAM COMPLETE  Result Date: 08/05/2022    ECHOCARDIOGRAM REPORT  Patient Name:   William [name] Date of Exam: 08/05/2022 Medical Rec #:  [id number]     Height:       69.0 in Accession #:    [id number]    Weight:       230.6 lb Date of Birth:  04-12-1945     BSA:          2.195 m Patient Age:    76 years      BP:           118/54 mmHg Patient Gender: M             HR:           62 bpm. [facility] Procedure: 2D Echo, Cardiac Doppler and Color Doppler Indications:    Atrial Fibrillation I48.91 / Elevated Troponin  History:        Patient has prior history of Echocardiogram examinations, most                 recent 10/22/2021. CHF, CAD and Previous Myocardial Infarction,                 Arrythmias:LBBB and Atrial Fibrillation; Risk                 Factors:Hypertension, Diabetes, Dyslipidemia and Former Smoker.                 S/P TAVR (transcatheter aortic valve replacement).  Sonographer:    Celesta Gentile RCS Referring Phys: [id number] ASIA B ZIERLE-GHOSH IMPRESSIONS  1. Left ventricular ejection fraction, by estimation, is 55 to 60%. The left ventricle has normal function. Left ventricular endocardial border not optimally defined to evaluate regional wall motion. There is moderate left ventricular hypertrophy. Left ventricular diastolic  parameters are indeterminate. Elevated left atrial pressure.  2. Right ventricular systolic function is normal. The right ventricular size is normal. Tricuspid regurgitation signal is inadequate for assessing PA pressure.  3. The mitral valve is abnormal. Mild mitral valve regurgitation. No evidence of mitral stenosis.  4. There is a 23 mm Edwards sapien 3 TAVR valve present in the aortic position. Mean gradient of 16 mmhg is within the reported normal range for the valve.     . The aortic valve has been repaired/replaced. Aortic valve regurgitation is not visualized. No aortic stenosis is present.  5. The inferior vena cava is normal in size with greater than 50% respiratory variability, suggesting right atrial pressure of 3 mmHg. FINDINGS  Left Ventricle: Left ventricular ejection fraction, by estimation, is 55 to 60%. The left ventricle has normal function. Left ventricular endocardial border not optimally defined to evaluate regional wall motion. The left ventricular internal cavity size was normal in size. There is moderate left ventricular hypertrophy. Left ventricular diastolic parameters are indeterminate. Elevated left atrial pressure. Right Ventricle: The right ventricular size is normal. Right vetricular wall thickness was not well visualized. Right ventricular systolic function is normal. Tricuspid regurgitation signal is inadequate for assessing PA pressure. Left Atrium: Left atrial size was normal in size. Right Atrium: Right atrial size was normal in size. Pericardium: There is no evidence of pericardial effusion. Mitral Valve: The mitral valve is abnormal. Mild mitral valve regurgitation. No evidence of mitral valve stenosis. Tricuspid Valve: The tricuspid valve is normal in structure. Tricuspid valve regurgitation is not demonstrated. No evidence of tricuspid stenosis. Aortic Valve: There is a 23 mm Edwards sapien 3 TAVR valve present in the aortic position. Mean gradient of 16  mmhg is within the  reported normal range for the valve. The aortic valve has been repaired/replaced. Aortic valve regurgitation is not visualized. No aortic stenosis is present. Aortic valve mean gradient measures 17.0 mmHg. Aortic valve peak gradient measures 30.4 mmHg. Aortic valve area, by VTI measures 1.29 cm. Pulmonic Valve: The pulmonic valve was not well visualized. Pulmonic valve regurgitation is not visualized. No evidence of pulmonic stenosis. Aorta: The aortic root is normal in size and structure. Venous: The inferior vena cava is normal in size with greater than 50% respiratory variability, suggesting right atrial pressure of 3 mmHg. IAS/Shunts: No atrial level shunt detected by color flow Doppler.  LEFT VENTRICLE PLAX 2D LVIDd:         5.27 cm   Diastology LVIDs:         3.63 cm   LV e' medial:    5.00 cm/s LV PW:         1.30 cm   LV E/e' medial:  26.4 LV IVS:        1.40 cm   LV e' lateral:   5.87 cm/s LVOT diam:     1.90 cm   LV E/e' lateral: 22.5 LV SV:         82 LV SV Index:   37 LVOT Area:     2.84 cm  RIGHT VENTRICLE RV S prime:     10.80 cm/s TAPSE (M-mode): 2.2 cm LEFT ATRIUM             Index        RIGHT ATRIUM           Index LA diam:        5.10 cm 2.32 cm/m   RA Area:     16.70 cm LA Vol (A2C):   49.1 ml 22.37 ml/m  RA Volume:   43.20 ml  19.68 ml/m LA Vol (A4C):   71.3 ml 32.48 ml/m LA Biplane Vol: 64.1 ml 29.20 ml/m  AORTIC VALVE AV Area (Vmax):    1.28 cm AV Area (Vmean):   1.30 cm AV Area (VTI):     1.29 cm AV Vmax:           275.50 cm/s AV Vmean:          194.500 cm/s AV VTI:            0.632 m AV Peak Grad:      30.4 mmHg AV Mean Grad:      17.0 mmHg LVOT Vmax:         124.00 cm/s LVOT Vmean:        89.350 cm/s LVOT VTI:          0.288 m LVOT/AV VTI ratio: 0.46  AORTA Ao Root diam: 3.20 cm MITRAL VALVE MV Area (PHT): 3.53 cm     SHUNTS MV Decel Time: 215 msec     Systemic VTI:  0.29 m MR Peak grad: 111.5 mmHg    Systemic Diam: 1.90 cm MR Mean grad: 54.0 mmHg MR Vmax:      528.00 cm/s MR  Vmean:     332.0 cm/s MV E velocity: 132.00 cm/s MV A velocity: 129.00 cm/s MV E/A ratio:  1.02 Dina Rich MD Electronically signed by Dina Rich MD Signature Date/Time: 08/05/2022/12:40:40 PM    Final    DG Chest Port 1 View  Result Date: 08/04/2022 CLINICAL DATA:  weakness EXAM: PORTABLE CHEST 1 VIEW COMPARISON:  CXR 03/17/22 FINDINGS: No pleural effusion. No pneumothorax. No  focal airspace opacity. Normal cardiac and mediastinal contours. Low lung volumes. No radiographically apparent displaced rib fractures. Visualized upper abdomen is unremarkable IMPRESSION: Low lung volumes. No focal airspace opacity. Electronically Signed   By: Lorenza Cambridge M.D.   On: 08/04/2022 15:00     Discharge Exam: Vitals:   08/05/22 0008 08/05/22 0329  BP: (!) 116/50 (!) 118/54  Pulse: 61 62  Resp: 16 18  Temp: 97.7 F (36.5 C) 98.1 F (36.7 C)  SpO2: 91% 95%   Vitals:   08/04/22 2015 08/04/22 2023 08/05/22 0008 08/05/22 0329  BP:   (!) 116/50 (!) 118/54  Pulse:  64 61 62  Resp:   16 18  Temp:   97.7 F (36.5 C) 98.1 F (36.7 C)  TempSrc:   Oral Oral  SpO2: 95% 95% 91% 95%  Weight:      Height:        General: Pt is alert, awake, not in acute distress Cardiovascular: RRR, S1/S2 +, no rubs, no gallops, irregular and rate controlled Respiratory: CTA bilaterally, no wheezing, no rhonchi Abdominal: Soft, NT, ND, bowel sounds + Extremities: no edema, no cyanosis    The results of significant diagnostics from this hospitalization (including imaging, microbiology, ancillary and laboratory) are listed below for reference.     Microbiology: No results found for this or any previous visit (from the past 240 hour(s)).   Labs: BNP (last 3 results) Recent Labs    10/14/21 1424 03/17/22 1504  BNP 180.0* 38.0   Basic Metabolic Panel: Recent Labs  Lab 08/04/22 1412 08/05/22 0327  NA 133* 136  K 4.4 4.2  CL 103 107  CO2 25 26  GLUCOSE 257* 187*  BUN 29* 27*  CREATININE 1.63*  1.61*  CALCIUM 9.8 9.7  MG 1.9 1.9   Liver Function Tests: Recent Labs  Lab 08/05/22 0327  AST 16  ALT 17  ALKPHOS 29*  BILITOT 1.0  PROT 6.1*  ALBUMIN 3.4*   No results for input(s): "LIPASE", "AMYLASE" in the last 168 hours. No results for input(s): "AMMONIA" in the last 168 hours. CBC: Recent Labs  Lab 08/04/22 1412 08/05/22 0327  WBC 8.2 7.4  NEUTROABS 6.1 4.4  HGB 14.3 13.1  HCT 43.3 40.0  MCV 96.4 95.5  PLT 190 174   Cardiac Enzymes: No results for input(s): "CKTOTAL", "CKMB", "CKMBINDEX", "TROPONINI" in the last 168 hours. BNP: Invalid input(s): "POCBNP" CBG: Recent Labs  Lab 08/04/22 2236 08/05/22 0726 08/05/22 1112  GLUCAP 254* 160* 302*   D-Dimer No results for input(s): "DDIMER" in the last 72 hours. Hgb A1c No results for input(s): "HGBA1C" in the last 72 hours. Lipid Profile No results for input(s): "CHOL", "HDL", "LDLCALC", "TRIG", "CHOLHDL", "LDLDIRECT" in the last 72 hours. Thyroid function studies No results for input(s): "TSH", "T4TOTAL", "T3FREE", "THYROIDAB" in the last 72 hours.  Invalid input(s): "FREET3" Anemia work up No results for input(s): "VITAMINB12", "FOLATE", "FERRITIN", "TIBC", "IRON", "RETICCTPCT" in the last 72 hours. Urinalysis    Component Value Date/Time   COLORURINE STRAW (A) 03/17/2022 1516   APPEARANCEUR Clear 04/28/2022 0917   LABSPEC 1.010 03/17/2022 1516   PHURINE 5.0 03/17/2022 1516   GLUCOSEU 2+ (A) 04/28/2022 0917   HGBUR NEGATIVE 03/17/2022 1516   BILIRUBINUR Negative 04/28/2022 0917   KETONESUR NEGATIVE 03/17/2022 1516   PROTEINUR Negative 04/28/2022 0917   PROTEINUR NEGATIVE 03/17/2022 1516   UROBILINOGEN 0.2 10/21/2014 2350   NITRITE Negative 04/28/2022 0917   NITRITE NEGATIVE 03/17/2022 1516   LEUKOCYTESUR  Negative 04/28/2022 0917   LEUKOCYTESUR NEGATIVE 03/17/2022 1516   Sepsis Labs Recent Labs  Lab 08/04/22 1412 08/05/22 0327  WBC 8.2 7.4   Microbiology No results found for this or any  previous visit (from the past 240 hour(s)).   Time coordinating discharge: 35 minutes  SIGNED:   Erick Blinks, DO [facility] 08/05/2022, 1:01 PM  If 7PM-7AM, please contact night-coverage [email or web address]

## 2022-08-05 NOTE — Progress Notes (Signed)
Patient discharged in stable condition with all belongings and verbalized understanding of discharge instructions and new medication list.

## 2022-08-05 NOTE — Progress Notes (Signed)
Date and time results received: 08/04/22  2240  Test: Troponin  Critical Value: 180  Name of Provider Notified: Zierle-Ghosh  Orders Received? Or Actions Taken?: New orders for repeat labs, Echo ordered.

## 2022-08-05 NOTE — Progress Notes (Signed)
*  PRELIMINARY RESULTS* Echocardiogram 2D Echocardiogram has been performed.  Stacey Drain 08/05/2022, 12:27 PM

## 2022-08-05 NOTE — Progress Notes (Signed)
Pt NPO since midnight for upcoming Echo this am. No c/o pain or discomfort throughout the shift. Plan of care ongoing.

## 2022-08-06 ENCOUNTER — Telehealth: Payer: Self-pay | Admitting: Student

## 2022-08-06 NOTE — Telephone Encounter (Signed)
Pt notified that Dr. Eden Emms says it is ok to attend the exercise therapy today.

## 2022-08-06 NOTE — Telephone Encounter (Signed)
Pt wants to know since being discharged, is he okay to go to exercise therapy today at the hospital. Please advise.

## 2022-08-15 DIAGNOSIS — M549 Dorsalgia, unspecified: Secondary | ICD-10-CM | POA: Diagnosis not present

## 2022-08-15 DIAGNOSIS — E1322 Other specified diabetes mellitus with diabetic chronic kidney disease: Secondary | ICD-10-CM | POA: Diagnosis not present

## 2022-08-15 DIAGNOSIS — N183 Chronic kidney disease, stage 3 unspecified: Secondary | ICD-10-CM | POA: Diagnosis not present

## 2022-08-15 DIAGNOSIS — M150 Primary generalized (osteo)arthritis: Secondary | ICD-10-CM | POA: Diagnosis not present

## 2022-08-15 DIAGNOSIS — E1165 Type 2 diabetes mellitus with hyperglycemia: Secondary | ICD-10-CM | POA: Diagnosis not present

## 2022-08-15 DIAGNOSIS — Z6834 Body mass index (BMI) 34.0-34.9, adult: Secondary | ICD-10-CM | POA: Diagnosis not present

## 2022-08-15 DIAGNOSIS — M1991 Primary osteoarthritis, unspecified site: Secondary | ICD-10-CM | POA: Diagnosis not present

## 2022-08-15 DIAGNOSIS — I480 Paroxysmal atrial fibrillation: Secondary | ICD-10-CM | POA: Diagnosis not present

## 2022-08-15 DIAGNOSIS — E6609 Other obesity due to excess calories: Secondary | ICD-10-CM | POA: Diagnosis not present

## 2022-08-15 DIAGNOSIS — E1140 Type 2 diabetes mellitus with diabetic neuropathy, unspecified: Secondary | ICD-10-CM | POA: Diagnosis not present

## 2022-08-19 ENCOUNTER — Other Ambulatory Visit: Payer: Self-pay | Admitting: Cardiovascular Disease

## 2022-08-19 ENCOUNTER — Telehealth: Payer: Self-pay | Admitting: Cardiovascular Disease

## 2022-08-19 NOTE — Telephone Encounter (Signed)
Prescription refill request for Eliquis received. Indication:afib Last office visit:4/24 Scr:1.61  7/24 Age: 77 Weight:104.6  kg  Prescription refilled

## 2022-08-19 NOTE — Telephone Encounter (Signed)
Patient returned staff call. 

## 2022-08-19 NOTE — Telephone Encounter (Signed)
Patient was returning call to confirm apt for 08/20/22 at 3:30 pm

## 2022-08-20 ENCOUNTER — Encounter: Payer: Self-pay | Admitting: Student

## 2022-08-20 ENCOUNTER — Ambulatory Visit: Payer: PPO | Attending: Student | Admitting: Student

## 2022-08-20 VITALS — BP 124/66 | HR 63 | Ht 69.0 in | Wt 239.8 lb

## 2022-08-20 DIAGNOSIS — Z952 Presence of prosthetic heart valve: Secondary | ICD-10-CM | POA: Diagnosis not present

## 2022-08-20 DIAGNOSIS — E1122 Type 2 diabetes mellitus with diabetic chronic kidney disease: Secondary | ICD-10-CM

## 2022-08-20 DIAGNOSIS — Z7984 Long term (current) use of oral hypoglycemic drugs: Secondary | ICD-10-CM

## 2022-08-20 DIAGNOSIS — I2510 Atherosclerotic heart disease of native coronary artery without angina pectoris: Secondary | ICD-10-CM

## 2022-08-20 DIAGNOSIS — E785 Hyperlipidemia, unspecified: Secondary | ICD-10-CM

## 2022-08-20 DIAGNOSIS — I10 Essential (primary) hypertension: Secondary | ICD-10-CM

## 2022-08-20 DIAGNOSIS — I480 Paroxysmal atrial fibrillation: Secondary | ICD-10-CM

## 2022-08-20 DIAGNOSIS — N183 Chronic kidney disease, stage 3 unspecified: Secondary | ICD-10-CM

## 2022-08-20 MED ORDER — METOPROLOL TARTRATE 37.5 MG PO TABS: 37.5000 mg | ORAL_TABLET | Freq: Two times a day (BID) | ORAL | 3 refills | Status: DC

## 2022-08-20 NOTE — Progress Notes (Signed)
Cardiology Office Note    Date:  08/20/2022  ID:  [name], William 07/23/1945, MRN [id number] Cardiologist: Charlton Haws, MD    History of Present Illness:    William [name] is a 77 y.o. male with past medical history of CAD (s/p DES to distal RCA in 08/2013, PCI/DES to LAD in 07/2021), aortic stenosis (s/p TAVR in 09/2021), paroxysmal atrial fibrillation, HTN, HLD and Type 2 DM who presents to the office today for hospital follow-up.   He was most recently admitted to [facility] from 7/29 - 08/05/2022 for evaluation of significant fatigue and tachycardia which had occurred while at cardiac rehab and was consistent with atrial fibrillation with RVR. He converted back to NSR prior to ER evaluation and was monitored overnight and maintained normal sinus rhythm. He had been on Lopressor 25 mg twice daily and this was titrated to 37.5 mg twice daily. It was recommended if he had issues with low heart rates over time, would refer to EP. Troponin values did peak at 180 which was felt to be secondary to demand ischemia. Repeat echocardiogram showed a normal EF of 55 to 60% with moderate LVH, normal RV function, mild MR and his TAVR valve was functioning normally with a mean gradient of 16 mmHg.  In talking with the patient today, he reports overall doing well since his recent hospitalization. He has been checking his heart rate at home and reports this has mostly been in the 60's to 70's. His vitals are also checked at cardiac rehab and he is unaware of any abnormal values. He does report his SBP is sometimes in the low 100's but he denies any associated lightheadedness, dizziness or presyncope. He has NYHA class II dyspnea but no orthopnea, PND or pitting edema. No recent chest pain or palpitations.  He remains on Eliquis for anticoagulation with no reports of melena, hematochezia or hematuria.  Studies Reviewed:   EKG: EKG is ordered today and demonstrates:   EKG Interpretation Date/Time:  Wednesday  August 20 2022 15:25:22 EDT Ventricular Rate:  63 PR Interval:  162 QRS Duration:  130 QT Interval:  410 QTC Calculation: 419 R Axis:   109  Text Interpretation: Normal sinus rhythm Known LBBB Confirmed by Randall An ([id number]) on 08/20/2022 4:43:41 PM        Event Monitor: 04/2022 Patch Wear Time:  13 days and 23 hours (2024-03-14T17:42:16-0400 to 2024-03-28T17:32:36-0400)   Patient had a min HR of 51 bpm, max HR of 184 bpm, and avg HR of 71 bpm. Predominant underlying rhythm was Sinus Rhythm. Bundle Branch Block/IVCD was present. QRS morphology changes were present throughout recording. 1 run of Ventricular Tachycardia  occurred lasting 4 beats with a max rate of 184 bpm (avg 158 bpm). 1 run of Supraventricular Tachycardia occurred lasting 9 beats with a max rate of 102 bpm (avg 93 bpm). Atrial Fibrillation occurred (1% burden), ranging from 68-153 bpm (avg of 98 bpm),  the longest lasting 5 hours 0 mins with an avg rate of 98 bpm. Isolated SVEs were rare (<1.0%), SVE Couplets were rare (<1.0%), and SVE Triplets were rare (<1.0%). Isolated VEs were occasional (1.9%, 26299), VE Couplets were rare (<1.0%, 388), and VE  Triplets were rare (<1.0%, 3). Ventricular Bigeminy and Trigeminy were present.  Echocardiogram: 07/2022 IMPRESSIONS     1. Left ventricular ejection fraction, by estimation, is 55 to 60%. The  left ventricle has normal function. Left ventricular endocardial border  not optimally defined to evaluate regional wall  motion. There is moderate  left ventricular hypertrophy. Left  ventricular diastolic parameters are indeterminate. Elevated left atrial  pressure.   2. Right ventricular systolic function is normal. The right ventricular  size is normal. Tricuspid regurgitation signal is inadequate for assessing  PA pressure.   3. The mitral valve is abnormal. Mild mitral valve regurgitation. No  evidence of mitral stenosis.   4. There is a 23 mm Edwards sapien 3 TAVR  valve present in the aortic  position. Mean gradient of 16 mmhg is within the reported normal range for  the valve.      . The aortic valve has been repaired/replaced. Aortic valve  regurgitation is not visualized. No aortic stenosis is present.   5. The inferior vena cava is normal in size with greater than 50%  respiratory variability, suggesting right atrial pressure of 3 mmHg.     Risk Assessment/Calculations:    CHA2DS2-VASc Score = 5   This indicates a 7.2% annual risk of stroke. The patient's score is based upon: CHF History: 0 HTN History: 1 Diabetes History: 1 Stroke History: 0 Vascular Disease History: 1 Age Score: 2 Gender Score: 0     Physical Exam:   VS:  BP 124/66 (BP Location: Right Arm, Patient Position: Sitting, Cuff Size: Normal)   Pulse 63   Ht 5\' 9"  (1.753 m)   Wt 239 lb 12.8 oz (108.8 kg)   SpO2 91%   BMI 35.41 kg/m    Wt Readings from Last 3 Encounters:  08/20/22 239 lb 12.8 oz (108.8 kg)  08/04/22 230 lb 9.6 oz (104.6 kg)  04/23/22 229 lb 6.4 oz (104.1 kg)     GEN: Well nourished, well developed male appearing in no acute distress NECK: No JVD; No carotid bruits CARDIAC: RRR, 2/6 systolic murmur along RUSB.  RESPIRATORY:  Clear to auscultation without rales, wheezing or rhonchi  ABDOMEN: Appears non-distended. No obvious abdominal masses. EXTREMITIES: No clubbing or cyanosis. No pitting edema.  Distal pedal pulses are 2+ bilaterally.   Assessment and Plan:   1. Paroxysmal Atrial Fibrillation - He denies any recent palpitations or significant fatigue resembling his prior atrial fibrillation. His heart rate has been well-controlled when checked at home and he is in normal sinus rhythm by examination and EKG today. At this time, will continue Lopressor 37.5 mg twice daily. We reviewed that if he continues to have recurrent arrhythmias, would refer to EP. - Continue Eliquis 5 mg twice daily for anticoagulation which is the appropriate dose at  this time given his age, weight and renal function. CBC last month showed that his hemoglobin was stable at 13.1 with platelets at 174 K.   2. CAD - He underwent stenting to the distal RCA in 08/2013 and DES to the LAD in 07/2021.  He remains active at baseline and denies any recent anginal symptoms. He is no longer on ASA given the need for anticoagulation. Continue Lopressor 37.5 mg twice daily and Crestor 20 mg daily.  3. Aortic Stenosis - He did undergo TAVR in 09/2021 and recent echocardiogram last month showed his TAVR valve was functioning normally with a mean gradient of 16 mmHg. I did confirm with the Structural Heart Team that he does not require a repeat echocardiogram in 09/2022 as previously scheduled. This has been canceled.  4. HTN - BP is well-controlled at 124/66 during today's visit. Continue current medical therapy with Amlodipine 5 mg daily, Lisinopril 5 mg daily and Lopressor 37.5 mg twice daily.  5.  HLD - LDL was 60 in 02/2022. Continue Crestor 20 mg daily.  6. Stage 3 CKD - His baseline creatinine is around 1.3- 1.4 but was elevated to 1.61 on most recent check. He had been on Lasix 20 mg twice daily and this was stopped at discharge with him being continued on Farxiga and Lisinopril. We discussed obtaining a repeat BMET today but he prefers to wait as he has follow-up with Nephrology within the next few weeks and will be having repeat labs at that time.   Signed, Ellsworth Lennox, PA-C

## 2022-08-20 NOTE — Patient Instructions (Signed)
Medication Instructions:  Your physician recommends that you continue on your current medications as directed. Please refer to the Current Medication list given to you today.  *If you need a refill on your cardiac medications before your next appointment, please call your pharmacy*   Lab Work: NONE   If you have labs (blood work) drawn today and your tests are completely normal, you will receive your results only by: MyChart Message (if you have MyChart) OR A paper copy in the mail If you have any lab test that is abnormal or we need to change your treatment, we will call you to review the results.   Testing/Procedures: NONE    Follow-Up: At [facility], you and your health needs are our priority.  As part of our continuing mission to provide you with exceptional heart care, we have created designated Provider Care Teams.  These Care Teams include your primary Cardiologist (physician) and Advanced Practice Providers (APPs -  Physician Assistants and Nurse Practitioners) who all work together to provide you with the care you need, when you need it.  We recommend signing up for the patient portal called "MyChart".  Sign up information is provided on this After Visit Summary.  MyChart is used to connect with patients for Virtual Visits (Telemedicine).  Patients are able to view lab/test results, encounter notes, upcoming appointments, etc.  Non-urgent messages can be sent to your provider as well.   To learn more about what you can do with MyChart, go to [email or web address].    Your next appointment:   4 -5 month(s)  Provider:   You may see Charlton Haws, MD or one of the following Advanced Practice Providers on your designated Care Team:   Randall An, PA-C  Jacolyn Reedy, PA-C     Other Instructions Thank you for choosing [facility]!

## 2022-09-18 DIAGNOSIS — R809 Proteinuria, unspecified: Secondary | ICD-10-CM | POA: Diagnosis not present

## 2022-09-18 DIAGNOSIS — N189 Chronic kidney disease, unspecified: Secondary | ICD-10-CM | POA: Diagnosis not present

## 2022-09-18 DIAGNOSIS — E210 Primary hyperparathyroidism: Secondary | ICD-10-CM | POA: Diagnosis not present

## 2022-09-18 DIAGNOSIS — I129 Hypertensive chronic kidney disease with stage 1 through stage 4 chronic kidney disease, or unspecified chronic kidney disease: Secondary | ICD-10-CM | POA: Diagnosis not present

## 2022-09-18 DIAGNOSIS — E1122 Type 2 diabetes mellitus with diabetic chronic kidney disease: Secondary | ICD-10-CM | POA: Diagnosis not present

## 2022-09-18 DIAGNOSIS — N200 Calculus of kidney: Secondary | ICD-10-CM | POA: Diagnosis not present

## 2022-09-18 DIAGNOSIS — E1129 Type 2 diabetes mellitus with other diabetic kidney complication: Secondary | ICD-10-CM | POA: Diagnosis not present

## 2022-09-24 ENCOUNTER — Ambulatory Visit: Payer: PPO | Admitting: "Endocrinology

## 2022-09-24 ENCOUNTER — Other Ambulatory Visit: Payer: Self-pay | Admitting: "Endocrinology

## 2022-09-24 ENCOUNTER — Other Ambulatory Visit: Payer: Self-pay

## 2022-09-24 DIAGNOSIS — E212 Other hyperparathyroidism: Secondary | ICD-10-CM

## 2022-09-25 DIAGNOSIS — E1122 Type 2 diabetes mellitus with diabetic chronic kidney disease: Secondary | ICD-10-CM | POA: Diagnosis not present

## 2022-09-25 DIAGNOSIS — R809 Proteinuria, unspecified: Secondary | ICD-10-CM | POA: Diagnosis not present

## 2022-09-25 DIAGNOSIS — N200 Calculus of kidney: Secondary | ICD-10-CM | POA: Diagnosis not present

## 2022-09-25 DIAGNOSIS — E210 Primary hyperparathyroidism: Secondary | ICD-10-CM | POA: Diagnosis not present

## 2022-10-01 ENCOUNTER — Other Ambulatory Visit (HOSPITAL_COMMUNITY): Payer: PPO

## 2022-10-03 ENCOUNTER — Ambulatory Visit: Payer: PPO | Admitting: Student

## 2022-10-06 ENCOUNTER — Other Ambulatory Visit (HOSPITAL_COMMUNITY): Payer: PPO

## 2022-10-08 DIAGNOSIS — M810 Age-related osteoporosis without current pathological fracture: Secondary | ICD-10-CM | POA: Diagnosis not present

## 2022-10-09 ENCOUNTER — Ambulatory Visit: Payer: PPO | Admitting: Cardiovascular Disease

## 2022-10-16 ENCOUNTER — Ambulatory Visit: Payer: PPO | Admitting: "Endocrinology

## 2022-10-16 ENCOUNTER — Encounter: Payer: Self-pay | Admitting: "Endocrinology

## 2022-10-16 ENCOUNTER — Telehealth: Payer: Self-pay | Admitting: Nurse Practitioner

## 2022-10-16 DIAGNOSIS — N200 Calculus of kidney: Secondary | ICD-10-CM | POA: Diagnosis not present

## 2022-10-16 DIAGNOSIS — E212 Other hyperparathyroidism: Secondary | ICD-10-CM

## 2022-10-16 DIAGNOSIS — E1122 Type 2 diabetes mellitus with diabetic chronic kidney disease: Secondary | ICD-10-CM

## 2022-10-16 DIAGNOSIS — Z7984 Long term (current) use of oral hypoglycemic drugs: Secondary | ICD-10-CM | POA: Diagnosis not present

## 2022-10-16 DIAGNOSIS — M810 Age-related osteoporosis without current pathological fracture: Secondary | ICD-10-CM | POA: Diagnosis not present

## 2022-10-16 DIAGNOSIS — Z794 Long term (current) use of insulin: Secondary | ICD-10-CM

## 2022-10-16 DIAGNOSIS — N1832 Chronic kidney disease, stage 3b: Secondary | ICD-10-CM

## 2022-10-16 NOTE — Progress Notes (Signed)
Endocrinology follow-up note    10/16/2022, 11:43 AM  William [name] is a 77 y.o.-year-old male, referred by his  Elfredia Nevins, MD . Patient is returning for follow-up after he was seen in consultation for hypercalcemia/hyperparathyroidism.   Past Medical History:  Diagnosis Date   Anxiety    Aortic stenosis    Arthritis    CAD (coronary artery disease)    a. s/p DES to distal RCA in 08/2013, DES to LAD 07/2021   Cancer [facility])    prostate   CKD (chronic kidney disease)    Diabetes mellitus without complication (HCC)    Family history of colon cancer    GERD (gastroesophageal reflux disease)    Heart murmur    Hypercalcemia    Hypercholesteremia    Hypertension    Kidney stone    PAF (paroxysmal atrial fibrillation) (HCC)    Personal history of colonic polyps    Pneumonia    S/P TAVR (transcatheter aortic valve replacement) 09/17/2021   s/p TAVR with a 23 mm Edwards S3UR via the TF approach by Dr. Clifton James & Laneta Simmers    Past Surgical History:  Procedure Laterality Date   APPENDECTOMY     BIOPSY  11/03/2019   Benign gastric mucosa with reactive changes and focal inflammation   BIOPSY  03/11/2021   Procedure: BIOPSY;  Surgeon: Lanelle Bal, DO;  [facility];  Service: Endoscopy;;   COLONOSCOPY WITH PROPOFOL N/A 02/15/2018   12 polyps ranging in 5 to 20 mm in size were tubular adenoma and recommended repeat exam in 2023   COLONOSCOPY WITH PROPOFOL N/A 03/11/2021   Procedure: COLONOSCOPY WITH PROPOFOL;  Surgeon: Lanelle Bal, DO;  Location: [facility];  Service: Endoscopy;  Laterality: N/A;  9:15am   CORONARY STENT INTERVENTION N/A 07/15/2021   Procedure: CORONARY STENT INTERVENTION;  Surgeon: Kathleene Hazel, MD;  Location: [facility] INVAS[facility] CV LAB;  Service: Cardiovascular;  Laterality: N/A;   CORONARY STENT PLACEMENT  08/10/2013   ESOPHAGOGASTRODUODENOSCOPY (EGD) WITH PROPOFOL N/A 11/03/2019   normal esophagus, small hiatal hernia, diffuse  erythematous mucosa in the entire stomach with scattered erosions.  Status post gastric biopsies for histology.  Surgical pathology found the biopsies to be benign gastric mucosa with reactive changes and focal inflammation, negative for H. Pylori.   FLEXIBLE SIGMOIDOSCOPY N/A 11/03/2019   attempted colonoscopy but inadequate prep   gsw to abd     INTRAOPERATIVE TRANSTHORACIC ECHOCARDIOGRAM N/A 09/17/2021   Procedure: INTRAOPERATIVE TRANSTHORACIC ECHOCARDIOGRAM;  Surgeon: Kathleene Hazel, MD;  [facility]: [facility] CV [facility];  Service: Open Heart Surgery;  Laterality: N/A;   LEFT HEART CATHETERIZATION WITH CORONARY ANGIOGRAM N/A 08/10/2013   Procedure: LEFT HEART CATHETERIZATION WITH CORONARY ANGIOGRAM;  Surgeon: Iran Ouch, MD;  Location: [facility] CATH LAB;  Service: Cardiovascular;  Laterality: N/A;   MULTIPLE EXTRACTIONS WITH ALVEOLOPLASTY N/A 08/22/2021   Procedure: MULTIPLE EXTRACTION WITH ALVEOLOPLASTY;  Surgeon: Sharman Cheek, [name];  Location: [facility] OR;  Service: Dentistry;  Laterality: N/A;   PERIPHERAL INTRAVASCULAR LITHOTRIPSY  07/15/2021   Procedure: INTRAVASCULAR LITHOTRIPSY;  Surgeon: Kathleene Hazel, MD;  [facility];  Service: Cardiovascular;;   POLYPECTOMY  02/15/2018   Procedure: POLYPECTOMY;  Surgeon: Corbin Ade, MD;  [facility];  Service: Endoscopy;;  colon   POLYPECTOMY  03/11/2021   Procedure: POLYPECTOMY;  Surgeon: Lanelle Bal, DO;  [facility];  Service: Endoscopy;;   RIGHT/LEFT HEART CATH AND  CORONARY ANGIOGRAPHY N/A 07/15/2021   Procedure: RIGHT/LEFT HEART CATH AND CORONARY ANGIOGRAPHY;  Surgeon: Kathleene Hazel, MD;  [facility];  Service: Cardiovascular;  Laterality: N/A;   TRANSCATHETER AORTIC VALVE REPLACEMENT, TRANSFEMORAL N/A 09/17/2021   Procedure: Transcatheter Aortic Valve Replacement, Transfemoral;  Surgeon: Kathleene Hazel, MD;  [facility];  Service:  Open Heart Surgery;  Laterality: N/A;    Social History   Tobacco Use   Smoking status: Former    Current packs/day: 0.00    Average packs/day: 3.0 packs/day for 45.0 years (135.0 ttl pk-yrs)    Types: Cigarettes    Start date: 01/06/1962    Quit date: 10/24/2004    Years since quitting: 17.9   Smokeless tobacco: Former    Quit date: 08/10/2005  Vaping Use   Vaping status: Never Used  Substance Use Topics   Alcohol use: Not Currently    Comment: occasional   Drug use: No    Family History  Problem Relation Age of Onset   Diabetes Mother    Heart attack Mother 83   Pulmonary embolism Father    Colon cancer Brother 42       Passed age 55 from colon ca   Gastric cancer Neg Hx    Esophageal cancer Neg Hx     Outpatient Encounter Medications as of 10/16/2022  Medication Sig   ACCU-CHEK GUIDE test strip    Accu-Chek Softclix Lancets lancets 1 each 3 (three) times daily.   alendronate (FOSAMAX) 70 MG tablet Take 1 tablet (70 mg total) by mouth every 7 (seven) days. Take with a full glass of water on an empty stomach. (Patient taking differently: Take 70 mg by mouth every 7 (seven) days. Take with a full glass of water on an empty stomach. Taken Saturday/Sunday)   alfuzosin (UROXATRAL) 10 MG 24 hr tablet Take 10 mg by mouth daily.   amLODipine (NORVASC) 5 MG tablet TAKE 1 TABLET(5 MG) BY MOUTH DAILY (Patient taking differently: Take 5 mg by mouth every evening.)   Ascorbic Acid (VITAMIN C) 1000 MG tablet Take 1,000 mg by mouth every morning.   Cholecalciferol (VITAMIN D3) 125 MCG (5000 UT) TABS Take 5,000 Units by mouth in the morning.   Coenzyme Q10 (COQ10) 100 MG CAPS Take 100 mg by mouth in the morning.   ELIQUIS 5 MG TABS tablet TAKE 1 TABLET(5 MG) BY MOUTH TWICE DAILY   FARXIGA 10 MG TABS tablet Take 10 mg by mouth in the morning.   finasteride (PROSCAR) 5 MG tablet Take 1 tablet (5 mg total) by mouth daily.   glipiZIDE (GLUCOTROL) 10 MG tablet Take 10 mg by mouth 2 (two)  times daily.   Glucosamine-Chondroitin (MOVE FREE PO) Take 1 tablet by mouth in the morning and at bedtime.   HYDROcodone-acetaminophen (NORCO) 10-325 MG tablet Take 1 tablet by mouth every 6 (six) hours as needed for moderate pain.   LEVEMIR FLEXTOUCH 100 UNIT/ML Pen Inject 90 Units into the skin at bedtime.    lisinopril (ZESTRIL) 5 MG tablet Take 5 mg by mouth every evening.   Magnesium 400 MG TABS Take 400 mg by mouth in the morning.   Menatetrenone (VITAMIN K2) 100 MCG TABS Take 100 mcg by mouth in the morning.   metFORMIN (GLUCOPHAGE) 1000 MG tablet Take 1,000 mg by mouth 2 (two) times daily.   metFORMIN (GLUCOPHAGE-XR) 500 MG 24 hr tablet Take 500 mg by mouth every evening. Take with 1000 Metformin  Metoprolol Tartrate 37.5 MG TABS Take 1 tablet (37.5 mg total) by mouth 2 (two) times daily.   nitroGLYCERIN (NITROSTAT) 0.4 MG SL tablet Place 1 tablet (0.4 mg total) under the tongue every 5 (five) minutes x 3 doses as needed for chest pain (if no relief after 2nd dose, proceed to the ED for an evalution or call 911).   pantoprazole (PROTONIX) 40 MG tablet Take 1 tablet (40 mg total) by mouth every evening.   potassium citrate (UROCIT-K) 5 MEQ (540 MG) SR tablet Take 1 tablet (5 mEq total) by mouth 3 (three) times daily with meals.   rosuvastatin (CRESTOR) 20 MG tablet Take 1 tablet (20 mg total) by mouth daily.   silodosin (RAPAFLO) 8 MG CAPS capsule Take 1 capsule (8 mg total) by mouth daily with breakfast.   tamsulosin (FLOMAX) 0.4 MG CAPS capsule Take 0.4 mg by mouth daily.   tiZANidine (ZANAFLEX) 4 MG tablet Take 4 mg by mouth every 6 (six) hours as needed.   trolamine salicylate (BLUE-EMU HEMP) 10 % cream Apply 1 application topically as needed for muscle pain.   vitamin B-12 (CYANOCOBALAMIN) 1000 MCG tablet Take 1,000 mcg by mouth in the morning.   zinc gluconate 50 MG tablet Take 50 mg by mouth in the morning.   No facility-administered encounter medications on file as of  10/16/2022.    No Known Allergies   HPI  DARTANYON FRANKOWSKI was diagnosed with hypercalcemia in December 2021.  History is obtained directly from the patient as well as chart review.  Since December 2021, he was found to have slightly above target calcium level on 5 different occasions.  The highest calcium registered was 10.6 associated with high PTH of 76.   Patient has CKD.  He is on expectant management for mild hypercalcemia likely due to mild hyperparathyroidism.  During workup for hypercalcemia, he was diagnosed with osteoporosis.  He was initiated on Fosamax 70 mg p.o. weekly, he continues to tolerate.    He gives history of prostate cancer reportedly in remission following up with urology in [facility].  Patient also has history of nephrolithiasis for the last 10 years.   Patient has no previously known history of  pituitary, adrenal dysfunctions; no family history of such dysfunctions.  His most recent labs are from December 2020. He has 80-90-pack-year history of smoking.  CT scan of his chest was consistent with benign findings except for coronary atherosclerosis-multiple coronary arteries . He is not under cardiology care-s/p stent placement.  He does not have any prior history of fragility fractures.    He has CKD with GFR of 44.   He was also observed to have slightly elevated PTHrP which was observed to improve over time.  he is not on HCTZ or other thiazide therapy.  He has vitamin D deficiency on supplement with vitamin D3 5000 units daily. he is not on calcium supplements,  he eats dairy and green, leafy, vegetables on average amounts.  he does not have a family history of hypercalcemia, pituitary tumors, thyroid cancer, or osteoporosis.  His medical history also includes type 2 diabetes on metformin, Farxiga and glipizide along with Levemir 90 units at bedtime. Parathyroid sestamibi scan in November 2022 did not show definitive parathyroid adenoma.     ROS:  Constitutional: + Fluctuating body weight, no fatigue, no subjective hyperthermia, no subjective hypothermia   PE: BP 132/60   Pulse 60   Ht 5\' 9"  (1.753 m)   Wt 232 lb  12.8 oz (105.6 kg)   BMI 34.38 kg/m , Body mass index is 34.38 kg/m. Wt Readings from Last 3 Encounters:  10/16/22 232 lb 12.8 oz (105.6 kg)  08/20/22 239 lb 12.8 oz (108.8 kg)  08/04/22 230 lb 9.6 oz (104.6 kg)    Constitutional:  + BMI of 34.9, not in acute distress, normal state of mind Eyes: PERRLA, EOMI, no exophthalmos ENT: moist mucous membranes, no gross thyromegaly, no gross cervical lymphadenopathy    CMP ( most recent) CMP     Component Value Date/Time   NA 136 08/05/2022 0327   NA 142 03/04/2022 0824   K 4.2 08/05/2022 0327   CL 107 08/05/2022 0327   CO2 26 08/05/2022 0327   GLUCOSE 187 (H) 08/05/2022 0327   BUN 27 (H) 08/05/2022 0327   BUN 17 03/04/2022 0824   CREATININE 1.61 (H) 08/05/2022 0327   CALCIUM 10.3 (H) 10/08/2022 0829   PROT 6.1 (L) 08/05/2022 0327   PROT 6.5 03/04/2022 0824   ALBUMIN 3.4 (L) 08/05/2022 0327   ALBUMIN 4.5 03/04/2022 0824   AST 16 08/05/2022 0327   ALT 17 08/05/2022 0327   ALKPHOS 29 (L) 08/05/2022 0327   BILITOT 1.0 08/05/2022 0327   BILITOT 0.4 03/04/2022 0824   GFRNONAA 44 (L) 08/05/2022 0327   GFRAA >60 02/12/2018 1053      Lipid Panel ( most recent) Lipid Panel     Component Value Date/Time   CHOL 118 03/04/2022 0824   TRIG 109 03/04/2022 0824   HDL 38 (L) 03/04/2022 0824   CHOLHDL 3.1 03/04/2022 0824   CHOLHDL 3.1 10/27/2007 0900   VLDL 15 10/27/2007 0900   LDLCALC 60 03/04/2022 0824   LABVLDL 20 03/04/2022 0824  Since May 2022 his calcium has been 10.6 associated with PTH of 60-61.  Latest Reference Range & Units 03/04/22 08:24 03/17/22 15:04 08/04/22 14:12 08/05/22 03:27 10/08/22 08:29  Glucose 70 - 99 mg/dL 191 (H) 478 (H) 295 (H) 187 (H)   Hemoglobin A1C 4.8 - 5.6 %    7.7 (H)   PTH, Intact 15 - 65 pg/mL 72 (H)    76 (H)   PTH-related peptide      WILL FOLLOW (P)  PTH Interp  Comment    Comment  TSH 0.450 - 4.500 uIU/mL 1.620      T4,Free(Direct) 0.82 - 1.77 ng/dL 6.21      (H): Data is abnormally high (P): Preliminary  Assessment: 1. Hypercalcemia /hyperparathyroidism 2.  Elevated PTH RP 3. Nephrolithiasis  Plan: He returns with stable calcium at 10.3 associated with PTH of 76.   He does have CKD with ejection fraction of 44.   He was previously found to have elevated PTH RP of 8.8, which decreased to 4.9.    His 24-hour urine calcium was 75. -His presentation is still possible to be due to mild primary hyperparathyroidism, considering high or unsuppressed PTH for hypercalcemia.    He also has CKD which is known to elevated PTH, as well as falsely elevated PTH RP.  - I discussed with the patient about the physiology of calcium and parathyroid hormone, and possible  effects of  increased PTH/ Calcium, including kidney stones, cardiac dysrhythmias, osteoporosis, abdominal pain, etc.   He will be continued on expectant/observation management.    - Patient also  has vitamin D deficiency, currently on vitamin D supplement.  His last vitamin D was 31.5.    -Patient with history of nephrolithiasis, not clear if it was related  to hypercalcemia.   -He does have osteoporosis based on his recent bone density.  He is tolerating Fosamax, advised to continue Fosamax 70 mg p.o. weekly.  Side effects and precautions discussed with him.    -In light of his history of prostate cancer, chronic heavy smoking, and elevated PTH RP, he was sent for pulmonary CT which showed benign findings except for the aortic atherosclerosis.  Subsequent cardiac workup revealed multiple coronary artery stenosis, patient is status post stent placement.   He will return in 6 months with repeat labs.  If he continues to have hypercalcemia, he will be considered for either surgical intervention or Sensipar.  In light of his severe/medical  coronary disease, he was approached for possible plant-based diet.  Patient is hesitant to engage at this time .  He also has type 2 diabetes, wishes to address this with his PMD. He is advised to maintain close follow-up with his PMD , cardiologist ,  as well as nephrologist.   I spent  25  minutes in the care of the patient today including review of labs from Thyroid Function, CMP, and other relevant labs ; imaging/biopsy records (current and previous including abstractions from other facilities); face-to-face time discussing  his lab results and symptoms, medications doses, his options of short and long term treatment based on the latest standards of care / guidelines;   and documenting the encounter.  York Cerise Lamarche  participated in the discussions, expressed understanding, and voiced agreement with the above plans.  All questions were answered to his satisfaction. he is encouraged to contact clinic should he have any questions or concerns prior to his return visit.    - Return in about 6 months (around 04/16/2023) for F/U with Pre-visit Labs, DXA Scan B4 NV.   Marquis Lunch, MD [facility] [address] [place], [place] [address] Phone: [phone]  Fax: [phone]    This note was partially dictated with voice recognition software. Similar sounding words can be transcribed inadequately or may not  be corrected upon review.  10/16/2022, 11:43 AM

## 2022-10-16 NOTE — Telephone Encounter (Signed)
Please review labs on your desk and let us know if she needs a follow up

## 2022-10-16 NOTE — Telephone Encounter (Signed)
Think you put this in on the wrong patient

## 2022-10-20 LAB — PTH, INTACT AND CALCIUM
Calcium: 10.3 mg/dL — ABNORMAL HIGH (ref 8.6–10.2)
PTH: 76 pg/mL — ABNORMAL HIGH (ref 15–65)

## 2022-10-20 LAB — PTH-RELATED PEPTIDE: PTH-related peptide: 2.8 pmol/L

## 2022-10-29 ENCOUNTER — Ambulatory Visit: Payer: PPO | Admitting: Urology

## 2022-10-29 VITALS — BP 111/56 | HR 61

## 2022-10-29 DIAGNOSIS — R3912 Poor urinary stream: Secondary | ICD-10-CM | POA: Diagnosis not present

## 2022-10-29 DIAGNOSIS — N200 Calculus of kidney: Secondary | ICD-10-CM

## 2022-10-29 DIAGNOSIS — N400 Enlarged prostate without lower urinary tract symptoms: Secondary | ICD-10-CM | POA: Diagnosis not present

## 2022-10-29 LAB — URINALYSIS, ROUTINE W REFLEX MICROSCOPIC
Bilirubin, UA: NEGATIVE
Ketones, UA: NEGATIVE
Leukocytes,UA: NEGATIVE
Nitrite, UA: NEGATIVE
Protein,UA: NEGATIVE
RBC, UA: NEGATIVE
Specific Gravity, UA: 1.015 (ref 1.005–1.030)
Urobilinogen, Ur: 1 mg/dL (ref 0.2–1.0)
pH, UA: 7 (ref 5.0–7.5)

## 2022-10-29 LAB — BLADDER SCAN AMB NON-IMAGING: Scan Result: 46

## 2022-10-29 MED ORDER — FINASTERIDE 5 MG PO TABS: 5.0000 mg | ORAL_TABLET | Freq: Every day | ORAL | 3 refills | Status: AC

## 2022-10-29 MED ORDER — SILODOSIN 8 MG PO CAPS
8.0000 mg | ORAL_CAPSULE | Freq: Every day | ORAL | 3 refills | Status: DC
Start: 2022-10-29 — End: 2023-11-17

## 2022-10-29 NOTE — Progress Notes (Signed)
post void residual=46 ?
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
## 2022-11-02 ENCOUNTER — Encounter: Payer: Self-pay | Admitting: Urology

## 2022-11-02 NOTE — Patient Instructions (Signed)

## 2022-11-17 DIAGNOSIS — I480 Paroxysmal atrial fibrillation: Secondary | ICD-10-CM | POA: Diagnosis not present

## 2022-11-17 DIAGNOSIS — Z6834 Body mass index (BMI) 34.0-34.9, adult: Secondary | ICD-10-CM | POA: Diagnosis not present

## 2022-11-17 DIAGNOSIS — E6609 Other obesity due to excess calories: Secondary | ICD-10-CM | POA: Diagnosis not present

## 2022-11-17 DIAGNOSIS — E1165 Type 2 diabetes mellitus with hyperglycemia: Secondary | ICD-10-CM | POA: Diagnosis not present

## 2022-11-17 DIAGNOSIS — E1322 Other specified diabetes mellitus with diabetic chronic kidney disease: Secondary | ICD-10-CM | POA: Diagnosis not present

## 2022-11-17 DIAGNOSIS — M150 Primary generalized (osteo)arthritis: Secondary | ICD-10-CM | POA: Diagnosis not present

## 2022-11-17 DIAGNOSIS — N183 Chronic kidney disease, stage 3 unspecified: Secondary | ICD-10-CM | POA: Diagnosis not present

## 2022-11-17 DIAGNOSIS — E1140 Type 2 diabetes mellitus with diabetic neuropathy, unspecified: Secondary | ICD-10-CM | POA: Diagnosis not present

## 2022-11-17 DIAGNOSIS — M549 Dorsalgia, unspecified: Secondary | ICD-10-CM | POA: Diagnosis not present

## 2022-11-17 DIAGNOSIS — E1129 Type 2 diabetes mellitus with other diabetic kidney complication: Secondary | ICD-10-CM | POA: Diagnosis not present

## 2023-01-03 NOTE — Progress Notes (Signed)
 Cardiology Office Note    Date:  01/03/2023  ID:  [name], [name] 15-May-1945, MRN [id number] Cardiologist: Maude Emmer, MD    History of Present Illness:    William [name] is a 77 y.o. male with past medical history of CAD (s/p DES to distal RCA in 08/2013, PCI/DES to LAD in 07/2021), aortic stenosis (s/p TAVR in 09/2021), paroxysmal atrial fibrillation, HTN, HLD and Type 2 DM who presents to the office today for hospital follow-up.   He was most recently admitted to [facility] from 7/29 - 08/05/2022 for evaluation of significant fatigue and tachycardia which had occurred while at cardiac rehab and was consistent with atrial fibrillation with RVR. He converted back to NSR prior to ER evaluation and was monitored overnight and maintained normal sinus rhythm. He had been on Lopressor  25 mg twice daily and this was titrated to 37.5 mg twice daily. It was recommended if he had issues with low heart rates over time, would refer to EP. Troponin values did peak at 180 which was felt to be secondary to demand ischemia. Repeat echocardiogram showed a normal EF of 55 to 60% with moderate LVH, normal RV function, mild MR and his TAVR valve was functioning normally with a mean gradient of 16 mmHg.  He has had no recurrence of PAF. No angina   Admitted 01/05/23 with dyspnea No angina. R/O CXR with cephalization He was in sinus with no recurrent PAF. COVID negative Improved with iv lasix . His lasix  had been d/c earlier in year due to his CRF D/c CR better 1.07 with K 3.9 D/c on lasix  40 mg daily Baseline weight at home 225.7 lbs   TTE 01/05/23 with EF 50-55% mild MR normal TAVR valve  Been ok since being home. Weight still going down a bit has f/u with nephrology in February  Studies Reviewed:   EKG:  08/20/22 NSR rate 64 chronic LBBB    Event Monitor: 04/2022 Patch Wear Time:  13 days and 23 hours (2024-03-14T17:42:16-0400 to 2024-03-28T17:32:36-0400)   Patient had a min HR of 51 bpm, max HR of 184  bpm, and avg HR of 71 bpm. Predominant underlying rhythm was Sinus Rhythm. Bundle Branch Block/IVCD was present. QRS morphology changes were present throughout recording. 1 run of Ventricular Tachycardia  occurred lasting 4 beats with a max rate of 184 bpm (avg 158 bpm). 1 run of Supraventricular Tachycardia occurred lasting 9 beats with a max rate of 102 bpm (avg 93 bpm). Atrial Fibrillation occurred (1% burden), ranging from 68-153 bpm (avg of 98 bpm),  the longest lasting 5 hours 0 mins with an avg rate of 98 bpm. Isolated SVEs were rare (<1.0%), SVE Couplets were rare (<1.0%), and SVE Triplets were rare (<1.0%). Isolated VEs were occasional (1.9%, 26299), VE Couplets were rare (<1.0%, 388), and VE  Triplets were rare (<1.0%, 3). Ventricular Bigeminy and Trigeminy were present.  Echocardiogram: 07/2022 IMPRESSIONS     1. Left ventricular ejection fraction, by estimation, is 55 to 60%. The  left ventricle has normal function. Left ventricular endocardial border  not optimally defined to evaluate regional wall motion. There is moderate  left ventricular hypertrophy. Left  ventricular diastolic parameters are indeterminate. Elevated left atrial  pressure.   2. Right ventricular systolic function is normal. The right ventricular  size is normal. Tricuspid regurgitation signal is inadequate for assessing  PA pressure.   3. The mitral valve is abnormal. Mild mitral valve regurgitation. No  evidence of mitral stenosis.  4. There is a 23 mm Edwards sapien 3 TAVR valve present in the aortic  position. Mean gradient of 16 mmhg is within the reported normal range for  the valve.      . The aortic valve has been repaired/replaced. Aortic valve  regurgitation is not visualized. No aortic stenosis is present.   5. The inferior vena cava is normal in size with greater than 50%  respiratory variability, suggesting right atrial pressure of 3 mmHg.     Risk Assessment/Calculations:    CHA2DS2-VASc  Score = 5   This indicates a 7.2% annual risk of stroke. The patient's score is based upon: CHF History: 0 HTN History: 1 Diabetes History: 1 Stroke History: 0 Vascular Disease History: 1 Age Score: 2 Gender Score: 0     Physical Exam:   VS:  There were no vitals taken for this visit.   Wt Readings from Last 3 Encounters:  10/16/22 232 lb 12.8 oz (105.6 kg)  08/20/22 239 lb 12.8 oz (108.8 kg)  08/04/22 230 lb 9.6 oz (104.6 kg)     Affect appropriate Elderly male  HEENT: normal Neck supple with no adenopathy JVP normal no bruits no thyromegaly Lungs clear with no wheezing and good diaphragmatic motion Heart:  S1/S2 SEM through TAVR valve no AR  murmur, no rub, gallop or click PMI normal Abdomen: benighn, BS positve, no tenderness, no AAA no bruit.  No HSM or HJR Distal pulses intact with no bruits No edema Neuro non-focal Skin warm and dry No muscular weakness    Assessment and Plan:   1. Paroxysmal Atrial Fibrillation - Maintaining NSR continue  Lopressor  37.5 mg twice daily. We reviewed that if he continues to have recurrent arrhythmias, would refer to EP. - Continue Eliquis  5 mg twice daily for anticoagulation which is the appropriate dose at this time given his age, weight and renal function. CBC last month showed that his hemoglobin was stable at 13.1 with platelets at 174 K.   2. CAD - He underwent stenting to the distal RCA in 08/2013 and DES to the LAD in 07/2021.  He remains active at baseline and denies any recent anginal symptoms. He is no longer on ASA given the need for anticoagulation. Continue Lopressor  37.5 mg twice daily and Crestor  20 mg daily.  3. Aortic Stenosis - He did undergo TAVR in 09/2021 and recent echocardiogram 08/05/22 showed his TAVR valve was functioning normally with a mean gradient of 16 mmHg.  4. HTN - BP is well-controlled at 124/66 during today's visit. Continue current medical therapy with Amlodipine  5 mg daily, Lisinopril  5 mg  daily and Lopressor  37.5 mg twice daily.  5. HLD - LDL was 60 in 02/2022. Continue Crestor  20 mg daily.  6. Stage 3 CKD - Cr improved normal on hospital d/c 01/2023  7. BPH:   -sees McKenzie on finasteride  and rapaflo    8.  CHF:  recent admission with no real change in EF, normal TAVR valve, no recurrent PAF. Improved with lasix  now on 40 mg daily Check BNP and BMET His BNP was only 174 on admission   BNP/BMET  F/U in 3 months   Signed, Maude Emmer, MD

## 2023-01-05 ENCOUNTER — Ambulatory Visit: Payer: PPO | Admitting: Cardiovascular Disease

## 2023-01-05 ENCOUNTER — Inpatient Hospital Stay (HOSPITAL_COMMUNITY): Payer: PPO

## 2023-01-05 ENCOUNTER — Other Ambulatory Visit: Payer: Self-pay

## 2023-01-05 ENCOUNTER — Emergency Department (HOSPITAL_COMMUNITY): Payer: PPO

## 2023-01-05 ENCOUNTER — Encounter (HOSPITAL_COMMUNITY): Payer: Self-pay | Admitting: Emergency Medicine

## 2023-01-05 ENCOUNTER — Other Ambulatory Visit (HOSPITAL_COMMUNITY): Payer: Self-pay | Admitting: *Deleted

## 2023-01-05 ENCOUNTER — Inpatient Hospital Stay (HOSPITAL_COMMUNITY)
Admission: EM | Admit: 2023-01-05 | Discharge: 2023-01-07 | DRG: 291 | Disposition: A | Discharge status: Home / Self Care | Payer: PPO | Attending: Family Medicine | Admitting: Family Medicine

## 2023-01-05 DIAGNOSIS — E669 Obesity, unspecified: Secondary | ICD-10-CM | POA: Diagnosis present

## 2023-01-05 DIAGNOSIS — E1165 Type 2 diabetes mellitus with hyperglycemia: Secondary | ICD-10-CM | POA: Diagnosis present

## 2023-01-05 DIAGNOSIS — I110 Hypertensive heart disease with heart failure: Secondary | ICD-10-CM | POA: Diagnosis not present

## 2023-01-05 DIAGNOSIS — Z7984 Long term (current) use of oral hypoglycemic drugs: Secondary | ICD-10-CM

## 2023-01-05 DIAGNOSIS — Z8249 Family history of ischemic heart disease and other diseases of the circulatory system: Secondary | ICD-10-CM | POA: Diagnosis not present

## 2023-01-05 DIAGNOSIS — I130 Hypertensive heart and chronic kidney disease with heart failure and stage 1 through stage 4 chronic kidney disease, or unspecified chronic kidney disease: Principal | ICD-10-CM | POA: Diagnosis present

## 2023-01-05 DIAGNOSIS — I509 Heart failure, unspecified: Principal | ICD-10-CM

## 2023-01-05 DIAGNOSIS — E212 Other hyperparathyroidism: Secondary | ICD-10-CM | POA: Diagnosis present

## 2023-01-05 DIAGNOSIS — N400 Enlarged prostate without lower urinary tract symptoms: Secondary | ICD-10-CM | POA: Diagnosis present

## 2023-01-05 DIAGNOSIS — Z800 Family history of malignant neoplasm of digestive organs: Secondary | ICD-10-CM

## 2023-01-05 DIAGNOSIS — I447 Left bundle-branch block, unspecified: Secondary | ICD-10-CM | POA: Diagnosis not present

## 2023-01-05 DIAGNOSIS — E7800 Pure hypercholesterolemia, unspecified: Secondary | ICD-10-CM | POA: Diagnosis not present

## 2023-01-05 DIAGNOSIS — Z79899 Other long term (current) drug therapy: Secondary | ICD-10-CM

## 2023-01-05 DIAGNOSIS — E1122 Type 2 diabetes mellitus with diabetic chronic kidney disease: Secondary | ICD-10-CM | POA: Diagnosis present

## 2023-01-05 DIAGNOSIS — Z1152 Encounter for screening for COVID-19: Secondary | ICD-10-CM | POA: Diagnosis not present

## 2023-01-05 DIAGNOSIS — I480 Paroxysmal atrial fibrillation: Secondary | ICD-10-CM | POA: Diagnosis present

## 2023-01-05 DIAGNOSIS — Z833 Family history of diabetes mellitus: Secondary | ICD-10-CM | POA: Diagnosis not present

## 2023-01-05 DIAGNOSIS — Z7983 Long term (current) use of bisphosphonates: Secondary | ICD-10-CM

## 2023-01-05 DIAGNOSIS — I080 Rheumatic disorders of both mitral and aortic valves: Secondary | ICD-10-CM

## 2023-01-05 DIAGNOSIS — I10 Essential (primary) hypertension: Secondary | ICD-10-CM | POA: Diagnosis not present

## 2023-01-05 DIAGNOSIS — I700 Atherosclerosis of aorta: Secondary | ICD-10-CM | POA: Diagnosis not present

## 2023-01-05 DIAGNOSIS — Z7901 Long term (current) use of anticoagulants: Secondary | ICD-10-CM

## 2023-01-05 DIAGNOSIS — Z952 Presence of prosthetic heart valve: Secondary | ICD-10-CM | POA: Diagnosis not present

## 2023-01-05 DIAGNOSIS — Z955 Presence of coronary angioplasty implant and graft: Secondary | ICD-10-CM

## 2023-01-05 DIAGNOSIS — Z87891 Personal history of nicotine dependence: Secondary | ICD-10-CM

## 2023-01-05 DIAGNOSIS — F419 Anxiety disorder, unspecified: Secondary | ICD-10-CM | POA: Diagnosis present

## 2023-01-05 DIAGNOSIS — I5033 Acute on chronic diastolic (congestive) heart failure: Secondary | ICD-10-CM | POA: Diagnosis present

## 2023-01-05 DIAGNOSIS — I2510 Atherosclerotic heart disease of native coronary artery without angina pectoris: Secondary | ICD-10-CM | POA: Diagnosis present

## 2023-01-05 DIAGNOSIS — N1831 Chronic kidney disease, stage 3a: Secondary | ICD-10-CM | POA: Diagnosis not present

## 2023-01-05 DIAGNOSIS — R918 Other nonspecific abnormal finding of lung field: Secondary | ICD-10-CM | POA: Diagnosis not present

## 2023-01-05 DIAGNOSIS — R0602 Shortness of breath: Secondary | ICD-10-CM | POA: Diagnosis not present

## 2023-01-05 DIAGNOSIS — Z859 Personal history of malignant neoplasm, unspecified: Secondary | ICD-10-CM

## 2023-01-05 DIAGNOSIS — Z794 Long term (current) use of insulin: Secondary | ICD-10-CM | POA: Diagnosis not present

## 2023-01-05 DIAGNOSIS — F1120 Opioid dependence, uncomplicated: Secondary | ICD-10-CM | POA: Diagnosis present

## 2023-01-05 DIAGNOSIS — J9601 Acute respiratory failure with hypoxia: Secondary | ICD-10-CM | POA: Insufficient documentation

## 2023-01-05 DIAGNOSIS — I4510 Unspecified right bundle-branch block: Secondary | ICD-10-CM | POA: Diagnosis not present

## 2023-01-05 DIAGNOSIS — Z6833 Body mass index (BMI) 33.0-33.9, adult: Secondary | ICD-10-CM

## 2023-01-05 DIAGNOSIS — K219 Gastro-esophageal reflux disease without esophagitis: Secondary | ICD-10-CM | POA: Diagnosis present

## 2023-01-05 LAB — ECHOCARDIOGRAM COMPLETE
AR max vel: 0.72 cm2
AV Area VTI: 0.82 cm2
AV Area mean vel: 0.68 cm2
AV Mean grad: 19.6 mm[Hg]
AV Peak grad: 33.4 mm[Hg]
Ao pk vel: 2.89 m/s
Area-P 1/2: 3.6 cm2
Height: 69 in
MV M vel: 5.55 m/s
MV Peak grad: 123.2 mm[Hg]
S' Lateral: 3.8 cm
Weight: 3768 [oz_av]

## 2023-01-05 LAB — BASIC METABOLIC PANEL
Anion gap: 7 (ref 5–15)
BUN: 11 mg/dL (ref 8–23)
CO2: 25 mmol/L (ref 22–32)
Calcium: 9.5 mg/dL (ref 8.9–10.3)
Chloride: 114 mmol/L — ABNORMAL HIGH (ref 98–111)
Creatinine, Ser: 1.01 mg/dL (ref 0.61–1.24)
GFR, Estimated: >60 mL/min (ref >60)
Glucose, Bld: 83 mg/dL (ref 70–99)
Potassium: 3.9 mmol/L (ref 3.5–5.1)
Sodium: 146 mmol/L — ABNORMAL HIGH (ref 135–145)

## 2023-01-05 LAB — RESP PANEL BY RT-PCR (RSV, FLU A&B, COVID)  RVPGX2
Influenza A by PCR: NEGATIVE
Influenza B by PCR: NEGATIVE
Resp Syncytial Virus by PCR: NEGATIVE
SARS Coronavirus 2 by RT PCR: NEGATIVE

## 2023-01-05 LAB — CBC WITH DIFFERENTIAL/PLATELET
Abs Immature Granulocytes: 0.03 10*3/uL (ref 0.00–0.07)
Basophils Absolute: 0.1 10*3/uL (ref 0.0–0.1)
Basophils Relative: 1 %
Eosinophils Absolute: 0.4 10*3/uL (ref 0.0–0.5)
Eosinophils Relative: 4 %
HCT: 39.2 % (ref 39.0–52.0)
Hemoglobin: 12.3 g/dL — ABNORMAL LOW (ref 13.0–17.0)
Immature Granulocytes: 0 %
Lymphocytes Relative: 15 %
Lymphs Abs: 1.3 10*3/uL (ref 0.7–4.0)
MCH: 29.5 pg (ref 26.0–34.0)
MCHC: 31.4 g/dL (ref 30.0–36.0)
MCV: 94 fL (ref 80.0–100.0)
Monocytes Absolute: 0.8 10*3/uL (ref 0.1–1.0)
Monocytes Relative: 10 %
Neutro Abs: 5.9 10*3/uL (ref 1.7–7.7)
Neutrophils Relative %: 70 %
Platelets: 209 10*3/uL (ref 150–400)
RBC: 4.17 MIL/uL — ABNORMAL LOW (ref 4.22–5.81)
RDW: 15.9 % — ABNORMAL HIGH (ref 11.5–15.5)
WBC: 8.4 10*3/uL (ref 4.0–10.5)
nRBC: 0 % (ref 0.0–0.2)

## 2023-01-05 LAB — GLUCOSE, CAPILLARY
Glucose-Capillary: 74 mg/dL (ref 70–99)
Glucose-Capillary: 140 mg/dL — ABNORMAL HIGH (ref 70–99)
Glucose-Capillary: 165 mg/dL — ABNORMAL HIGH (ref 70–99)

## 2023-01-05 LAB — TROPONIN I (HIGH SENSITIVITY)
Troponin I (High Sensitivity): 27 ng/L — ABNORMAL HIGH (ref <18)
Troponin I (High Sensitivity): 29 ng/L — ABNORMAL HIGH (ref <18)

## 2023-01-05 LAB — BRAIN NATRIURETIC PEPTIDE: B Natriuretic Peptide: 174 pg/mL — ABNORMAL HIGH (ref 0.0–100.0)

## 2023-01-05 MED ORDER — SODIUM CHLORIDE 0.9% FLUSH
3.0000 mL | Freq: Two times a day (BID) | INTRAVENOUS | Status: DC
  Administered 2023-01-05 – 2023-01-07 (×5): 3 mL via INTRAVENOUS

## 2023-01-05 MED ORDER — ONDANSETRON HCL 4 MG/2ML IJ SOLN: 4.0000 mg | Freq: Four times a day (QID) | INTRAMUSCULAR | Status: DC | PRN

## 2023-01-05 MED ORDER — METOPROLOL TARTRATE 25 MG PO TABS
37.5000 mg | ORAL_TABLET | Freq: Two times a day (BID) | ORAL | Status: DC
  Administered 2023-01-05 – 2023-01-07 (×5): 37.5 mg via ORAL
  Filled 2023-01-05 (×6): qty 2

## 2023-01-05 MED ORDER — FUROSEMIDE 10 MG/ML IJ SOLN
40.0000 mg | Freq: Once | INTRAMUSCULAR | Status: AC
Start: 2023-01-05 — End: 2023-01-05
  Administered 2023-01-05: 40 mg via INTRAVENOUS
  Filled 2023-01-05: qty 4

## 2023-01-05 MED ORDER — ZINC GLUCONATE 50 MG PO TABS: 50.0000 mg | ORAL_TABLET | Freq: Every morning | ORAL | Status: DC

## 2023-01-05 MED ORDER — INSULIN ASPART 100 UNIT/ML IJ SOLN
0.0000 [IU] | Freq: Every day | INTRAMUSCULAR | Status: DC
  Administered 2023-01-06: 3 [IU] via SUBCUTANEOUS

## 2023-01-05 MED ORDER — VITAMIN B-12 1000 MCG PO TABS
1000.0000 ug | ORAL_TABLET | Freq: Every morning | ORAL | Status: DC
  Administered 2023-01-05 – 2023-01-07 (×3): 1000 ug via ORAL
  Filled 2023-01-05 (×3): qty 1

## 2023-01-05 MED ORDER — APIXABAN 5 MG PO TABS
5.0000 mg | ORAL_TABLET | Freq: Two times a day (BID) | ORAL | Status: DC
  Administered 2023-01-05 – 2023-01-07 (×5): 5 mg via ORAL
  Filled 2023-01-05 (×5): qty 1

## 2023-01-05 MED ORDER — FUROSEMIDE 10 MG/ML IJ SOLN
40.0000 mg | Freq: Every day | INTRAMUSCULAR | Status: DC
  Administered 2023-01-06 – 2023-01-07 (×2): 40 mg via INTRAVENOUS
  Filled 2023-01-05 (×2): qty 4

## 2023-01-05 MED ORDER — ROSUVASTATIN CALCIUM 20 MG PO TABS
20.0000 mg | ORAL_TABLET | Freq: Every day | ORAL | Status: DC
  Administered 2023-01-05 – 2023-01-07 (×3): 20 mg via ORAL
  Filled 2023-01-05 (×3): qty 1

## 2023-01-05 MED ORDER — SODIUM CHLORIDE 0.9% FLUSH: 3.0000 mL | INTRAVENOUS | Status: DC | PRN

## 2023-01-05 MED ORDER — ALBUTEROL SULFATE (2.5 MG/3ML) 0.083% IN NEBU: 2.5000 mg | INHALATION_SOLUTION | RESPIRATORY_TRACT | Status: DC | PRN

## 2023-01-05 MED ORDER — FINASTERIDE 5 MG PO TABS
5.0000 mg | ORAL_TABLET | Freq: Every day | ORAL | Status: DC
  Administered 2023-01-05 – 2023-01-07 (×3): 5 mg via ORAL
  Filled 2023-01-05 (×3): qty 1

## 2023-01-05 MED ORDER — ACETAMINOPHEN 325 MG PO TABS: 650.0000 mg | ORAL_TABLET | ORAL | Status: DC | PRN

## 2023-01-05 MED ORDER — ALBUTEROL SULFATE HFA 108 (90 BASE) MCG/ACT IN AERS: 2.0000 | INHALATION_SPRAY | RESPIRATORY_TRACT | Status: DC | PRN

## 2023-01-05 MED ORDER — INSULIN ASPART 100 UNIT/ML IJ SOLN
0.0000 [IU] | Freq: Three times a day (TID) | INTRAMUSCULAR | Status: DC
  Administered 2023-01-05 – 2023-01-06 (×2): 2 [IU] via SUBCUTANEOUS
  Administered 2023-01-06: 3 [IU] via SUBCUTANEOUS
  Administered 2023-01-07: 2 [IU] via SUBCUTANEOUS

## 2023-01-05 MED ORDER — INSULIN GLARGINE-YFGN 100 UNIT/ML ~~LOC~~ SOLN
20.0000 [IU] | Freq: Every day | SUBCUTANEOUS | Status: DC
  Administered 2023-01-05 – 2023-01-06 (×2): 20 [IU] via SUBCUTANEOUS
  Filled 2023-01-05 (×3): qty 0.2

## 2023-01-05 MED ORDER — MAGNESIUM OXIDE -MG SUPPLEMENT 400 (240 MG) MG PO TABS
400.0000 mg | ORAL_TABLET | Freq: Every morning | ORAL | Status: DC
  Administered 2023-01-05 – 2023-01-07 (×3): 400 mg via ORAL
  Filled 2023-01-05 (×3): qty 1

## 2023-01-05 MED ORDER — HYDROCODONE-ACETAMINOPHEN 10-325 MG PO TABS
1.0000 | ORAL_TABLET | Freq: Four times a day (QID) | ORAL | Status: DC | PRN
  Administered 2023-01-05 – 2023-01-07 (×4): 1 via ORAL
  Filled 2023-01-05 (×4): qty 1

## 2023-01-05 MED ORDER — SODIUM CHLORIDE 0.9 % IV SOLN
250.0000 mL | INTRAVENOUS | Status: AC | PRN
Start: 2023-01-05 — End: 2023-01-06

## 2023-01-05 MED ORDER — TAMSULOSIN HCL 0.4 MG PO CAPS
0.4000 mg | ORAL_CAPSULE | Freq: Every day | ORAL | Status: DC
Start: 2023-01-05 — End: 2023-01-07
  Administered 2023-01-05 – 2023-01-07 (×3): 0.4 mg via ORAL
  Filled 2023-01-05 (×3): qty 1

## 2023-01-05 NOTE — Plan of Care (Signed)
Problem: Education: Goal: Knowledge of General Education information will improve Description: Including pain rating scale, medication(s)/side effects and non-pharmacologic comfort measures 01/05/2023 1917 by Jacqualine Code, RN Outcome: Progressing 01/05/2023 1916 by Jacqualine Code, RN Outcome: Progressing   Problem: Health Behavior/Discharge Planning: Goal: Ability to manage health-related needs will improve 01/05/2023 1917 by Jacqualine Code, RN Outcome: Progressing 01/05/2023 1916 by Jacqualine Code, RN Outcome: Progressing   Problem: Clinical Measurements: Goal: Ability to maintain clinical measurements within normal limits will improve 01/05/2023 1917 by Jacqualine Code, RN Outcome: Progressing 01/05/2023 1916 by Jacqualine Code, RN Outcome: Progressing Goal: Will remain free from infection 01/05/2023 1917 by Jacqualine Code, RN Outcome: Progressing 01/05/2023 1916 by Jacqualine Code, RN Outcome: Progressing Goal: Diagnostic test results will improve 01/05/2023 1917 by Jacqualine Code, RN Outcome: Progressing 01/05/2023 1916 by Jacqualine Code, RN Outcome: Progressing Goal: Respiratory complications will improve 01/05/2023 1917 by Jacqualine Code, RN Outcome: Progressing 01/05/2023 1916 by Jacqualine Code, RN Outcome: Progressing Goal: Cardiovascular complication will be avoided 01/05/2023 1917 by Jacqualine Code, RN Outcome: Progressing 01/05/2023 1916 by Jacqualine Code, RN Outcome: Progressing   Problem: Activity: Goal: Risk for activity intolerance will decrease 01/05/2023 1917 by Jacqualine Code, RN Outcome: Progressing 01/05/2023 1916 by Jacqualine Code, RN Outcome: Progressing   Problem: Nutrition: Goal: Adequate nutrition will be maintained 01/05/2023 1917 by Jacqualine Code, RN Outcome: Progressing 01/05/2023 1916 by Jacqualine Code, RN Outcome: Progressing   Problem: Coping: Goal: Level of anxiety will decrease 01/05/2023  1917 by Jacqualine Code, RN Outcome: Progressing 01/05/2023 1916 by Jacqualine Code, RN Outcome: Progressing   Problem: Elimination: Goal: Will not experience complications related to bowel motility 01/05/2023 1917 by Jacqualine Code, RN Outcome: Progressing 01/05/2023 1916 by Jacqualine Code, RN Outcome: Progressing Goal: Will not experience complications related to urinary retention 01/05/2023 1917 by Jacqualine Code, RN Outcome: Progressing 01/05/2023 1916 by Jacqualine Code, RN Outcome: Progressing   Problem: Pain Management: Goal: General experience of comfort will improve 01/05/2023 1917 by Jacqualine Code, RN Outcome: Progressing 01/05/2023 1916 by Jacqualine Code, RN Outcome: Progressing   Problem: Safety: Goal: Ability to remain free from injury will improve 01/05/2023 1917 by Jacqualine Code, RN Outcome: Progressing 01/05/2023 1916 by Jacqualine Code, RN Outcome: Progressing   Problem: Skin Integrity: Goal: Risk for impaired skin integrity will decrease 01/05/2023 1917 by Jacqualine Code, RN Outcome: Progressing 01/05/2023 1916 by Jacqualine Code, RN Outcome: Progressing   Problem: Education: Goal: Ability to describe self-care measures that may prevent or decrease complications (Diabetes Survival Skills Education) will improve 01/05/2023 1917 by Jacqualine Code, RN Outcome: Progressing 01/05/2023 1916 by Jacqualine Code, RN Outcome: Progressing Goal: Individualized Educational Video(s) 01/05/2023 1917 by Jacqualine Code, RN Outcome: Progressing 01/05/2023 1916 by Jacqualine Code, RN Outcome: Progressing   Problem: Coping: Goal: Ability to adjust to condition or change in health will improve 01/05/2023 1917 by Jacqualine Code, RN Outcome: Progressing 01/05/2023 1916 by Jacqualine Code, RN Outcome: Progressing   Problem: Fluid Volume: Goal: Ability to maintain a balanced intake and output will improve 01/05/2023 1917 by Jacqualine Code, RN Outcome: Progressing 01/05/2023 1916 by Jacqualine Code, RN Outcome: Progressing   Problem: Health Behavior/Discharge Planning: Goal: Ability to identify and utilize available resources and services will improve 01/05/2023 1917 by Jacqualine Code, RN Outcome: Progressing 01/05/2023 1916 by Jacqualine Code,  RN Outcome: Progressing Goal: Ability to manage health-related needs will improve 01/05/2023 1917 by Jacqualine Code, RN Outcome: Progressing 01/05/2023 1916 by Jacqualine Code, RN Outcome: Progressing   Problem: Metabolic: Goal: Ability to maintain appropriate glucose levels will improve 01/05/2023 1917 by Jacqualine Code, RN Outcome: Progressing 01/05/2023 1916 by Jacqualine Code, RN Outcome: Progressing   Problem: Nutritional: Goal: Maintenance of adequate nutrition will improve 01/05/2023 1917 by Jacqualine Code, RN Outcome: Progressing 01/05/2023 1916 by Jacqualine Code, RN Outcome: Progressing Goal: Progress toward achieving an optimal weight will improve 01/05/2023 1917 by Jacqualine Code, RN Outcome: Progressing 01/05/2023 1916 by Jacqualine Code, RN Outcome: Progressing   Problem: Skin Integrity: Goal: Risk for impaired skin integrity will decrease 01/05/2023 1917 by Jacqualine Code, RN Outcome: Progressing 01/05/2023 1916 by Jacqualine Code, RN Outcome: Progressing   Problem: Tissue Perfusion: Goal: Adequacy of tissue perfusion will improve 01/05/2023 1917 by Jacqualine Code, RN Outcome: Progressing 01/05/2023 1916 by Jacqualine Code, RN Outcome: Progressing

## 2023-01-05 NOTE — TOC Initial Note (Signed)
Transition of Care [facility]) - Initial/Assessment Note    Patient Details  Name: William [name] MRN: [id number] Date of Birth: 1945/09/01  Transition of Care [facility]) CM/SW Contact:    Karn Cassis, LCSW Phone Number: 01/05/2023, 8:59 AM  Clinical Narrative: Pt admitted due to acute on chronic heart failure. TOC received consult for CHF screening. Pt reports he lives alone and is independent with ADLs. He drives himself to appointments. CHF screening completed. Pt indicates he weighs himself several times a week. He takes medications as prescribed. When asked about following a heart health diet, pt states, "I'm working on it." LCSW added CHF education to AVS and will follow.                   Expected Discharge Plan: Home/Self Care Barriers to Discharge: Continued Medical Work up   Patient Goals and CMS Choice Patient states their goals for this hospitalization and ongoing recovery are:: return home   Choice offered to / list presented to : Patient [place] ownership interest in [facility].provided to::  (n/a)    Expected Discharge Plan and Services In-house Referral: Clinical Social Work     Living arrangements for the past 2 months: Single Family Home                                      Prior Living Arrangements/Services Living arrangements for the past 2 months: Single Family Home Lives with:: Self Patient language and need for interpreter reviewed:: Yes Do you feel safe going back to the place where you live?: Yes      Need for Family Participation in Patient Care: No (Comment)   Current home services: DME (walker) Criminal Activity/Legal Involvement Pertinent to Current Situation/Hospitalization: No - Comment as needed  Activities of Daily Living      Permission Sought/Granted                  Emotional Assessment     Affect (typically observed): Appropriate Orientation: : Oriented to Self, Oriented to Place, Oriented to   Time Alcohol / Substance Use: Not Applicable Psych Involvement: No (comment)  Admission diagnosis:  Acute on chronic diastolic CHF (congestive heart failure) (HCC) [I50.33] Patient Active Problem List   Diagnosis Date Noted   Acute on chronic diastolic CHF (congestive heart failure) (HCC) 01/05/2023   Acute respiratory failure with hypoxia (HCC) 01/05/2023   Elevated troponin 08/04/2022   AKI (acute kidney injury) (HCC) 08/04/2022   Atrial fibrillation with RVR (HCC) 09/22/2021   CAD S/P percutaneous coronary angioplasty 09/22/2021   LBBB (left bundle branch block) 09/22/2021   S/P TAVR (transcatheter aortic valve replacement) 09/17/2021   NSTEMI (non-ST elevated myocardial infarction) (HCC) 09/02/2021   Loss of teeth due to extraction 08/30/2021   Periodontal disease    Teeth missing 08/14/2021   Caries 08/14/2021   Retained tooth root 08/14/2021   Loose, teeth 08/14/2021   Excessive dental attrition 08/14/2021   Phobia of dental procedure 08/14/2021   Long term (current) use of antithrombotics/antiplatelets 08/14/2021   Chronic apical periodontitis 08/14/2021   Chronic periodontitis 08/14/2021   Atrophy of edentulous alveolar ridge 08/14/2021   Accretions on teeth 08/14/2021   Encounter for preoperative dental examination 08/13/2021   Low ferritin 08/13/2021   Severe aortic stenosis    Unstable angina (HCC)    Age-related osteoporosis without current pathological fracture 04/15/2021   Other  hyperparathyroidism (HCC) 02/12/2021   Stopped smoking with greater than 40 pack year history 02/12/2021   Hypercalcemia 01/24/2021   Iron deficiency anemia 05/26/2019   History of colonic polyps 08/18/2018   Family history of colon cancer 01/21/2018   History of coronary artery disease    Gastroesophageal reflux disease    Chronic diastolic heart failure (HCC)    CKD stage 3 due to type 2 diabetes mellitus (HCC)    Abdominal wall abscess at site of surgical wound 02/26/2015    Abscess of abdominal wall 02/26/2015   Coronary artery disease 08/24/2013   Chest pain 08/10/2013   Hyperlipidemia 08/10/2013   DM (diabetes mellitus) (HCC) 08/10/2013   HTN (hypertension) 08/10/2013   Solitary pulmonary nodule 08/10/2013   PCP:  Elfredia Nevins, MD Pharmacy:   [id number] - [place], [place] - [address] [place] [address] Phone: [phone] Fax: [phone]  [id number] - [place],  - [address] AT [facility] [address] [place] [address] Phone: [phone] Fax: [phone]     Social Drivers of Health (SDOH) Social History: SDOH Screenings   Food Insecurity: No Food Insecurity (08/04/2022)  Housing: Low Risk  (08/04/2022)  Transportation Needs: No Transportation Needs (08/04/2022)  Utilities: Not At Risk (08/04/2022)  Depression (PHQ2-9): Low Risk  (10/24/2021)  Tobacco Use: Medium Risk (01/05/2023)   SDOH Interventions:     Readmission Risk Interventions    09/18/2021   10:49 AM  Readmission Risk Prevention Plan  Post Dischage Appt Complete  Medication Screening Complete  Transportation Screening Complete

## 2023-01-05 NOTE — ED Notes (Signed)
Lab at bedside

## 2023-01-05 NOTE — ED Triage Notes (Signed)
Pt with c/o sob. EMS states pt's sats on R/A were 91% so they placed pt on O2 at 3L and sats improved to 96%. CBG 111 per EMS.

## 2023-01-05 NOTE — Progress Notes (Signed)
*  PRELIMINARY RESULTS* Echocardiogram 2D Echocardiogram has been performed.  Stacey Drain 01/05/2023, 1:44 PM

## 2023-01-05 NOTE — Hospital Course (Signed)
77 year old male with a history of paroxysmal atrial fibrillation, HFpEF, aortic stenosis status post TAVR 09/2021, coronary disease status post DES to RCA, hypertension, diabetes mellitus type 2, hyperlipidemia, CKD stage III, left bundle branch block, BPH presenting with shortness of breath that woke him from sleep on the early morning 01/05/2023. The patient denies any chest discomfort but he has had a nonproductive cough.  He has had some subjective fevers and chills and myalgias.  He denies any hemoptysis.  He denies any nausea, vomiting, abdominal pain.  He has had some loose stools without any hematochezia or melena.  There is no dysuria or hematuria. The patient was recently admitted to the hospital from 08/04/2022 to 08/05/2022 for atrial fibrillation with RVR.  His metoprolol was increased to 37.5 mg twice daily at that time.  His furosemide was stopped at that time because of AKI.  The patient states that he has not been on any furosemide since that hospitalization.  In the ED, the patient was afebrile and hemodynamically stable with oxygen saturation 91% room air.  He was placed on 2 L with saturation 95%.  WBC 8.4, hemoglobin 12.3, platelets 209.  Sodium 146, potassium 3.9, bicarbonate 25, serum creatinine 1.01.  Troponin 27>> 29.  Chest x-ray showed increased interstitial markings and vascular congestion.  EKG showed sinus rhythm with left bundle branch block.  COVID-19 PCR is negative.  The patient was given a dose of intravenous furosemide and admitted for further evaluation and treatment of his fluid overload.
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
## 2023-01-05 NOTE — ED Provider Notes (Signed)
[place] EMERGENCY DEPARTMENT AT [facility] Provider Note   CSN: [id number] Arrival date & time: 01/05/23  0244     History  Chief Complaint  Patient presents with   Shortness of Breath    William [name] is a 77 y.o. male.  Patient with history of CAD, paroxysmal atrial fibrillation, CHF presents to the Emergency Department for evaluation of shortness of breath.  Patient reports that he has not been feeling well for a couple of days but breathing worsened tonight.  He does not use oxygen at home.  EMS documented room air oxygen saturations of 91%, has improved on nasal cannula oxygen.       Home Medications Prior to Admission medications   Medication Sig Start Date End Date Taking? Authorizing Provider  ACCU-CHEK GUIDE test strip  05/21/21   [provider]  Accu-Chek Softclix Lancets lancets 1 each 3 (three) times daily. 05/20/21   [provider]  alendronate (FOSAMAX) 70 MG tablet Take 1 tablet (70 mg total) by mouth every 7 (seven) days. Take with a full glass of water on an empty stomach. Patient taking differently: Take 70 mg by mouth every 7 (seven) days. Take with a full glass of water on an empty stomach. Taken Saturday/Sunday 04/15/21   Roma Kayser, MD  amLODipine (NORVASC) 5 MG tablet TAKE 1 TABLET(5 MG) BY MOUTH DAILY Patient taking differently: Take 5 mg by mouth every evening. 11/30/17   Laqueta Linden, MD  Ascorbic Acid (VITAMIN C) 1000 MG tablet Take 1,000 mg by mouth every morning.    [provider]  Cholecalciferol (VITAMIN D3) 125 MCG (5000 UT) TABS Take 5,000 Units by mouth in the morning.    [provider]  Coenzyme Q10 (COQ10) 100 MG CAPS Take 100 mg by mouth in the morning.    [provider]  ELIQUIS 5 MG TABS tablet TAKE 1 TABLET(5 MG) BY MOUTH TWICE DAILY 08/19/22   Wendall Stade, MD  FARXIGA 10 MG TABS tablet Take 10 mg by mouth in the morning. 12/01/17   [provider]   finasteride (PROSCAR) 5 MG tablet Take 1 tablet (5 mg total) by mouth daily. 10/29/22   McKenzie, Mardene Celeste, MD  glipiZIDE (GLUCOTROL) 10 MG tablet Take 10 mg by mouth 2 (two) times daily. 08/02/13   [provider]  Glucosamine-Chondroitin (MOVE FREE PO) Take 1 tablet by mouth in the morning and at bedtime.    [provider]  HYDROcodone-acetaminophen (NORCO) 10-325 MG tablet Take 1 tablet by mouth every 6 (six) hours as needed for moderate pain.    [provider]  LEVEMIR FLEXTOUCH 100 UNIT/ML Pen Inject 90 Units into the skin at bedtime.  07/08/16   [provider]  lisinopril (ZESTRIL) 5 MG tablet Take 5 mg by mouth every evening.    [provider]  Magnesium 400 MG TABS Take 400 mg by mouth in the morning.    [provider]  Menatetrenone (VITAMIN K2) 100 MCG TABS Take 100 mcg by mouth in the morning.    [provider]  metFORMIN (GLUCOPHAGE) 1000 MG tablet Take 1,000 mg by mouth 2 (two) times daily. 09/14/14   [provider]  metFORMIN (GLUCOPHAGE-XR) 500 MG 24 hr tablet Take 500 mg by mouth every evening. Take with 1000 Metformin    [provider]  Metoprolol Tartrate 37.5 MG TABS Take 1 tablet (37.5 mg total) by mouth 2 (two) times daily. 08/20/22 08/15/23  Iran Ouch, [place] M, PA-C  nitroGLYCERIN (NITROSTAT) 0.4 MG SL tablet Place 1 tablet (0.4 mg total) under the tongue every 5 (five) minutes x 3 doses as needed for chest pain (if no relief after 2nd dose, proceed to the ED for an evalution or call 911). 07/02/20   Netta Neat., NP  pantoprazole (PROTONIX) 40 MG tablet Take 1 tablet (40 mg total) by mouth every evening. 02/18/22   Rourk, Gerrit Friends, MD  potassium citrate (UROCIT-K) 5 MEQ (540 MG) SR tablet Take 1 tablet (5 mEq total) by mouth 3 (three) times daily with meals. 04/28/22   McKenzie, Mardene Celeste, MD  rosuvastatin (CRESTOR) 20 MG tablet Take 1 tablet (20 mg total) by mouth daily. 08/06/22 09/05/22   Sherryll Burger, Pratik D, DO  silodosin (RAPAFLO) 8 MG CAPS capsule Take 1 capsule (8 mg total) by mouth daily with breakfast. 10/29/22   McKenzie, Mardene Celeste, MD  tamsulosin (FLOMAX) 0.4 MG CAPS capsule Take 0.4 mg by mouth daily. 06/24/22   [provider]  tiZANidine (ZANAFLEX) 4 MG tablet Take 4 mg by mouth every 6 (six) hours as needed. 07/07/22   [provider]  trolamine salicylate (BLUE-EMU HEMP) 10 % cream Apply 1 application topically as needed for muscle pain.    [provider]  vitamin B-12 (CYANOCOBALAMIN) 1000 MCG tablet Take 1,000 mcg by mouth in the morning.    [provider]  zinc gluconate 50 MG tablet Take 50 mg by mouth in the morning.    [provider]      Allergies    Patient has no known allergies.    Review of Systems   Review of Systems  Physical Exam Updated Vital Signs BP 125/87   Pulse 75   Temp 98.1 F (36.7 C) (Oral)   Resp (!) 21   Ht 5\' 9"  (1.753 m)   Wt 104.3 kg   SpO2 93%   BMI 33.97 kg/m  Physical Exam Vitals and nursing note reviewed.  Constitutional:      General: He is not in acute distress.    Appearance: He is well-developed.  HENT:     Head: Normocephalic and atraumatic.     Mouth/Throat:     Mouth: Mucous membranes are moist.  Eyes:     General: Vision grossly intact. Gaze aligned appropriately.     Extraocular Movements: Extraocular movements intact.     Conjunctiva/sclera: Conjunctivae normal.  Cardiovascular:     Rate and Rhythm: Normal rate and regular rhythm.     Pulses: Normal pulses.     Heart sounds: Normal heart sounds, S1 normal and S2 normal. No murmur heard.    No friction rub. No gallop.  Pulmonary:     Effort: Pulmonary effort is normal. No respiratory distress.     Breath sounds: Rales present.  Abdominal:     Palpations: Abdomen is soft.     Tenderness: There is no abdominal tenderness. There is no guarding or rebound.     Hernia: No hernia is present.  Musculoskeletal:         General: No swelling.     Cervical back: Full passive range of motion without pain, normal range of motion and neck supple. No pain with movement, spinous process tenderness or muscular tenderness. Normal range of motion.     Right lower leg: Edema present.     Left lower leg: Edema present.  Skin:    General: Skin is warm and dry.  Capillary Refill: Capillary refill takes less than 2 seconds.     Findings: No ecchymosis, erythema, lesion or wound.  Neurological:     Mental Status: He is alert and oriented to person, place, and time.     GCS: GCS eye subscore is 4. GCS verbal subscore is 5. GCS motor subscore is 6.     Cranial Nerves: Cranial nerves 2-12 are intact.     Sensory: Sensation is intact.     Motor: Motor function is intact. No weakness or abnormal muscle tone.     Coordination: Coordination is intact.  Psychiatric:        Mood and Affect: Mood normal.        Speech: Speech normal.        Behavior: Behavior normal.     ED Results / Procedures / Treatments   Labs (all labs ordered are listed, but only abnormal results are displayed) Labs Reviewed  CBC WITH DIFFERENTIAL/PLATELET - Abnormal; Notable for the following components:      Result Value   RBC 4.17 (*)    Hemoglobin 12.3 (*)    RDW 15.9 (*)    All other components within normal limits  BASIC METABOLIC PANEL - Abnormal; Notable for the following components:   Sodium 146 (*)    Chloride 114 (*)    All other components within normal limits  BRAIN NATRIURETIC PEPTIDE - Abnormal; Notable for the following components:   B Natriuretic Peptide 174.0 (*)    All other components within normal limits  TROPONIN I (HIGH SENSITIVITY) - Abnormal; Notable for the following components:   Troponin I (High Sensitivity) 27 (*)    All other components within normal limits  RESP PANEL BY RT-PCR (RSV, FLU A&B, COVID)  [id number]  TROPONIN I (HIGH SENSITIVITY)    EKG EKG Interpretation Date/Time:  Monday January 05 2023  02:54:21 EST Ventricular Rate:  81 PR Interval:  169 QRS Duration:  156 QT Interval:  412 QTC Calculation: 479 R Axis:   30  Text Interpretation: Sinus rhythm Left bundle branch block No significant change since last tracing Confirmed by Gilda Crease [phone]) on 01/05/2023 3:04:54 AM  Radiology DG Chest Portable 1 View Result Date: 01/05/2023 CLINICAL DATA:  SOB EXAM: PORTABLE CHEST 1 VIEW COMPARISON:  Chest x-ray 08/04/2022 trauma chest x-ray 03/17/2022 FINDINGS: The heart and mediastinal contours are unchanged. Aortic calcification. No focal consolidation. Increased interstitial markings. No pleural effusion. No pneumothorax. No acute osseous abnormality. IMPRESSION: 1. Increased interstitial markings. Could represent bronchitic changes versus pulmonary edema. 2.  Aortic Atherosclerosis (ICD10-I70.0). Electronically Signed   By: Tish Frederickson M.D.   On: 01/05/2023 03:29    Procedures Procedures    Medications Ordered in ED Medications  albuterol (VENTOLIN HFA) 108 (90 Base) MCG/ACT inhaler 2 puff (has no administration in time range)  furosemide (LASIX) injection 40 mg (has no administration in time range)    ED Course/ Medical Decision Making/ A&P                                 Medical Decision Making Amount and/or Complexity of Data Reviewed Labs: ordered. Radiology: ordered.  Risk Prescription drug management.   Presents to the emergency department for evaluation of shortness of breath.  Reports that he has not been feeling well for a couple of days but tonight became very short of breath.  He was mildly hypoxic, placed on supplemental oxygen by  EMS.  Patient appears somewhat volume overloaded at arrival.  He does have good air movement but rales throughout both lung fields, edema of both lower extremities.  Reviewing patient's records he does have a documented history of congestive heart failure with preserved ejection fraction.  Patient reports that he was  taken off his Lasix in July because of renal insufficiency and dehydration.  He has likely tipped over into mild heart failure currently.  Given Lasix IV here in the ED and will admit for further diuresis.        Final Clinical Impression(s) / ED Diagnoses Final diagnoses:  Acute on chronic congestive heart failure, unspecified heart failure type [facility])    Rx / DC Orders ED Discharge Orders     None         Gilda Crease, MD 01/05/23 [phone]

## 2023-01-05 NOTE — Plan of Care (Signed)

## 2023-01-06 DIAGNOSIS — I5033 Acute on chronic diastolic (congestive) heart failure: Secondary | ICD-10-CM | POA: Diagnosis not present

## 2023-01-06 DIAGNOSIS — I447 Left bundle-branch block, unspecified: Secondary | ICD-10-CM | POA: Diagnosis not present

## 2023-01-06 DIAGNOSIS — J9601 Acute respiratory failure with hypoxia: Secondary | ICD-10-CM | POA: Diagnosis not present

## 2023-01-06 LAB — BASIC METABOLIC PANEL
Anion gap: 6 (ref 5–15)
BUN: 17 mg/dL (ref 8–23)
CO2: 26 mmol/L (ref 22–32)
Calcium: 9.3 mg/dL (ref 8.9–10.3)
Chloride: 108 mmol/L (ref 98–111)
Creatinine, Ser: 1.13 mg/dL (ref 0.61–1.24)
GFR, Estimated: >60 mL/min (ref >60)
Glucose, Bld: 125 mg/dL — ABNORMAL HIGH (ref 70–99)
Potassium: 4 mmol/L (ref 3.5–5.1)
Sodium: 140 mmol/L (ref 135–145)

## 2023-01-06 LAB — GLUCOSE, CAPILLARY
Glucose-Capillary: 117 mg/dL — ABNORMAL HIGH (ref 70–99)
Glucose-Capillary: 146 mg/dL — ABNORMAL HIGH (ref 70–99)
Glucose-Capillary: 173 mg/dL — ABNORMAL HIGH (ref 70–99)
Glucose-Capillary: 278 mg/dL — ABNORMAL HIGH (ref 70–99)

## 2023-01-06 LAB — MRSA NEXT GEN BY PCR, NASAL: MRSA by PCR Next Gen: NOT DETECTED

## 2023-01-06 NOTE — Progress Notes (Signed)
 PROGRESS NOTE  William [name] FMW:[id number] DOB: March 24, 1945 DOA: 01/05/2023 PCP: Bertell Satterfield, MD  Brief History:  77 year old male with a history of paroxysmal atrial fibrillation, HFpEF, aortic stenosis status post TAVR 09/2021, coronary disease status post DES to RCA, hypertension, diabetes mellitus type 2, hyperlipidemia, CKD stage III, left bundle branch block, BPH presenting with shortness of breath that woke him from sleep on the early morning 01/05/2023. The patient denies any chest discomfort but he has had a nonproductive cough.  He has had some subjective fevers and chills and myalgias.  He denies any hemoptysis.  He denies any nausea, vomiting, abdominal pain.  He has had some loose stools without any hematochezia or melena.  There is no dysuria or hematuria. The patient was recently admitted to the hospital from 08/04/2022 to 08/05/2022 for atrial fibrillation with RVR.  His metoprolol  was increased to 37.5 mg twice daily at that time.  His furosemide  was stopped at that time because of AKI.  The patient states that he has not been on any furosemide  since that hospitalization.  In the ED, the patient was afebrile and hemodynamically stable with oxygen  saturation 91% room air.  He was placed on 2 L with saturation 95%.  WBC 8.4, hemoglobin 12.3, platelets 209.  Sodium 146, potassium 3.9, bicarbonate 25, serum creatinine 1.01.  Troponin 27>> 29.  Chest x-ray showed increased interstitial markings and vascular congestion.  EKG showed sinus rhythm with left bundle branch block.  COVID-19 PCR is negative.  The patient was given a dose of intravenous furosemide  and admitted for further evaluation and treatment of his fluid overload.   Assessment/Plan: Acute on chronic HFpEF -remains clinically fluid overloaded -Continue IV furosemide  -Daily weights -Accurate I's and Os--incomplete -Obtain ReDS vest--poor reading -08/05/2022 echo EF 55 to 60%, moderate LVH, normal RVF, mild MR,  status post TAVR -12/30 Echo EF 50-55%, no WMA, normal RVF   Paroxysmal atrial fibrillation -Currently in sinus rhythm -Continue apixaban  -Continue metoprolol  tartrate   Acute respiratory failure with hypoxia -stable on 2L -wean to room air -COVID/RSV/Flu--neg   Coronary artery disease -s/p DES to distal RCA in 08/2013, PCI/DES to LAD in 07/2021  -No chest pain presently -His Plavix  was stopped last hospital admission as it had been 1 year since his stent -Continue apixaban  -Continue statin   CKD stage IIIa -Baseline creatinine 1.3-1.6 -Monitor with diuresis   Diabetes mellitus type 2, uncontrolled with hyperglycemia -08/05/2022 hemoglobin A1c 7.7 -Start reduced dose Semglee  -NovoLog  sliding scale -Repeat hemoglobin A1c -Holding metformin  and glipizide    Opioid dependence -PDMP reviewed -Patient receives hydrocodone  10/325, #180, last refill 12/03/2022   Essential hypertension -Continue metoprolol  tartrate -Holding amlodipine  and lisinopril  to allow for blood pressure margin for diuresis   Hyperlipidemia -Continue statin   BPH -Continue finasteride    Left bundle branch block -Remain on telemetry   Obesity -BMI 33.97 -Lifestyle modification          Family Communication:  no Family at bedside  Consultants:  none  Code Status:  FULL   DVT Prophylaxis:  apixaban    Procedures: As Listed in Progress Note Above  Antibiotics: None       Subjective:  Pt states he is breathing better.  Denies f/c, cp, n/v/d, abd pain Objective: Vitals:   01/05/23 2151 01/06/23 0512 01/06/23 0831 01/06/23 1439  BP: (!) 140/78 (!) 145/64 (!) 163/62 (!) 161/70  Pulse: 73 65 66 66  Resp: 16 16  Temp: 97.9 F (36.6 C) 97.9 F (36.6 C)  98.2 F (36.8 C)  TempSrc: Oral   Oral  SpO2: 95% 95%  91%  Weight:  107.4 kg    Height:        Intake/Output Summary (Last 24 hours) at 01/06/2023 1742 Last data filed at 01/06/2023 1245 Gross per 24 hour  Intake  720 ml  Output 1550 ml  Net -830 ml   Weight change: 2.495 kg Exam:  General:  Pt is alert, follows commands appropriately, not in acute distress HEENT: No icterus, No thrush, No neck mass, [place]/AT Cardiovascular: RRR, S1/S2, no rubs, no gallops Respiratory: bibasilar rales. No wheeze Abdomen: Soft/+BS, non tender, non distended, no guarding Extremities: 1+LE  edema, No lymphangitis, No petechiae, No rashes, no synovitis   Data Reviewed: I have personally reviewed following labs and imaging studies Basic Metabolic Panel: Recent Labs  Lab 01/05/23 0313 01/06/23 0448  NA 146* 140  K 3.9 4.0  CL 114* 108  CO2 25 26  GLUCOSE 83 125*  BUN 11 17  CREATININE 1.01 1.13  CALCIUM  9.5 9.3   Liver Function Tests: No results for input(s): AST, ALT, ALKPHOS, BILITOT, PROT, ALBUMIN in the last 168 hours. No results for input(s): LIPASE, AMYLASE in the last 168 hours. No results for input(s): AMMONIA in the last 168 hours. Coagulation Profile: No results for input(s): INR, PROTIME in the last 168 hours. CBC: Recent Labs  Lab 01/05/23 0313  WBC 8.4  NEUTROABS 5.9  HGB 12.3*  HCT 39.2  MCV 94.0  PLT 209   Cardiac Enzymes: No results for input(s): CKTOTAL, CKMB, CKMBINDEX, TROPONINI in the last 168 hours. BNP: Invalid input(s): POCBNP CBG: Recent Labs  Lab 01/05/23 1641 01/05/23 2150 01/06/23 0724 01/06/23 1146 01/06/23 1600  GLUCAP 140* 165* 117* 146* 173*   HbA1C: No results for input(s): HGBA1C in the last 72 hours. Urine analysis:    Component Value Date/Time   COLORURINE STRAW (A) 03/17/2022 1516   APPEARANCEUR Clear 10/29/2022 1009   LABSPEC 1.010 03/17/2022 1516   PHURINE 5.0 03/17/2022 1516   GLUCOSEU 3+ (A) 10/29/2022 1009   HGBUR NEGATIVE 03/17/2022 1516   BILIRUBINUR Negative 10/29/2022 1009   KETONESUR NEGATIVE 03/17/2022 1516   PROTEINUR Negative 10/29/2022 1009   PROTEINUR NEGATIVE 03/17/2022 1516   UROBILINOGEN  0.2 10/21/2014 2350   NITRITE Negative 10/29/2022 1009   NITRITE NEGATIVE 03/17/2022 1516   LEUKOCYTESUR Negative 10/29/2022 1009   LEUKOCYTESUR NEGATIVE 03/17/2022 1516   Sepsis Labs: @LABRCNTIP (procalcitonin:4,lacticidven:4) ) Recent Results (from the past 240 hours)  Resp panel by RT-PCR (RSV, Flu A&B, Covid) Anterior Nasal Swab     Status: None   Collection Time: 01/05/23  3:34 AM   Specimen: Anterior Nasal Swab  Result Value Ref Range Status   SARS Coronavirus 2 by RT PCR NEGATIVE NEGATIVE Final    Comment: (NOTE) SARS-CoV-2 target nucleic acids are NOT DETECTED.  The SARS-CoV-2 RNA is generally detectable in upper respiratory specimens during the acute phase of infection. The lowest concentration of SARS-CoV-2 viral copies this assay can detect is 138 copies/mL. A negative result does not preclude SARS-Cov-2 infection and should not be used as the sole basis for treatment or other patient management decisions. A negative result may occur with  improper specimen collection/handling, submission of specimen other than nasopharyngeal swab, presence of viral mutation(s) within the areas targeted by this assay, and inadequate number of viral copies(<138 copies/mL). A negative result must be combined with clinical observations, patient  history, and epidemiological information. The expected result is Negative.  Fact Sheet for Patients:  bloggercourse.com  Fact Sheet for Healthcare Providers:  seriousbroker.it  This test is no t yet approved or cleared by the [place]  FDA and  has been authorized for detection and/or diagnosis of SARS-CoV-2 by FDA under an Emergency Use Authorization (EUA). This EUA will remain  in effect (meaning this test can be used) for the duration of the COVID-19 declaration under Section 564(b)(1) of the Act, 21 U.S.C.section 360bbb-3(b)(1), unless the authorization is terminated  or revoked sooner.        Influenza A by PCR NEGATIVE NEGATIVE Final   Influenza B by PCR NEGATIVE NEGATIVE Final    Comment: (NOTE) The Xpert Xpress SARS-CoV-2/FLU/RSV plus assay is intended as an aid in the diagnosis of influenza from Nasopharyngeal swab specimens and should not be used as a sole basis for treatment. Nasal washings and aspirates are unacceptable for Xpert Xpress SARS-CoV-2/FLU/RSV testing.  Fact Sheet for Patients: bloggercourse.com  Fact Sheet for Healthcare Providers: seriousbroker.it  This test is not yet approved or cleared by the [place]  FDA and has been authorized for detection and/or diagnosis of SARS-CoV-2 by FDA under an Emergency Use Authorization (EUA). This EUA will remain in effect (meaning this test can be used) for the duration of the COVID-19 declaration under Section 564(b)(1) of the Act, 21 U.S.C. section 360bbb-3(b)(1), unless the authorization is terminated or revoked.     Resp Syncytial Virus by PCR NEGATIVE NEGATIVE Final    Comment: (NOTE) Fact Sheet for Patients: bloggercourse.com  Fact Sheet for Healthcare Providers: seriousbroker.it  This test is not yet approved or cleared by the [place]  FDA and has been authorized for detection and/or diagnosis of SARS-CoV-2 by FDA under an Emergency Use Authorization (EUA). This EUA will remain in effect (meaning this test can be used) for the duration of the COVID-19 declaration under Section 564(b)(1) of the Act, 21 U.S.C. section 360bbb-3(b)(1), unless the authorization is terminated or revoked.  Performed at [facility], [address] [place], [place] [address]   MRSA Next Gen by PCR, Nasal     Status: None   Collection Time: 01/06/23 11:50 AM   Specimen: Nasal Mucosa; Nasal Swab  Result Value Ref Range Status   MRSA by PCR Next Gen NOT DETECTED NOT DETECTED Final    Comment: (NOTE) The  GeneXpert MRSA Assay (FDA approved for NASAL specimens only), is one component of a comprehensive MRSA colonization surveillance program. It is not intended to diagnose MRSA infection nor to guide or monitor treatment for MRSA infections. Test performance is not FDA approved in patients less than 3 years old. Performed at [facility], [address], [place], [place] [address]      Scheduled Meds:  apixaban   5 mg Oral BID   cyanocobalamin   1,000 mcg Oral q AM   finasteride   5 mg Oral Daily   furosemide   40 mg Intravenous Daily   insulin  aspart  0-15 Units Subcutaneous TID WC   insulin  aspart  0-5 Units Subcutaneous QHS   insulin  glargine-yfgn  20 Units Subcutaneous QHS   magnesium  oxide  400 mg Oral q AM   metoprolol  tartrate  37.5 mg Oral BID   rosuvastatin   20 mg Oral Daily   sodium chloride  flush  3 mL Intravenous Q12H   tamsulosin   0.4 mg Oral Daily   Continuous Infusions:  Procedures/Studies: ECHOCARDIOGRAM COMPLETE Result Date: 01/05/2023    ECHOCARDIOGRAM REPORT   Patient  Name:   VELMA JINNY NETTERS Date of Exam: 01/05/2023 Medical Rec #:  [id number]     Height:       69.0 in Accession #:    [id number]    Weight:       235.5 lb Date of Birth:  Jul 07, 1945     BSA:          2.215 m Patient Age:    77 years      BP:           161/70 mmHg Patient Gender: M             HR:           75 bpm. Exam Location:  [facility] Procedure: 2D Echo, Cardiac Doppler and Color Doppler Indications:    CHF-Acute Diastolic I50.31  History:        Patient has prior history of Echocardiogram examinations, most                 recent 08/05/2022. Arrythmias:LBBB; Risk Factors:Hypertension,                 Diabetes, Dyslipidemia and Former Smoker. Hx of TAVR.  Sonographer:    Aida Pizza RCS Referring Phys: [phone] [place] IMPRESSIONS  1. Left ventricular ejection fraction, by estimation, is 50 to 55%. The left ventricle has low normal function. The left ventricle has no regional wall motion abnormalities. The left  ventricular internal cavity size was mildly dilated. There is mild left ventricular hypertrophy. Left ventricular diastolic parameters were normal.  2. Right ventricular systolic function is normal. The right ventricular size is normal.  3. Left atrial size was moderately dilated.  4. The mitral valve is degenerative. Trivial mitral valve regurgitation. No evidence of mitral stenosis. Severe mitral annular calcification.  5. 23 mm Sapien 3 stented TAVR valve Since TTE 08/05/22 gradients fairly stable from mean 17-19.6 mmHg and peak 30.4 ->33.4 mmHg No signficant PVL . The aortic valve has been repaired/replaced. Aortic valve regurgitation is not visualized. No aortic stenosis is present.  6. Aortic dilatation noted. There is dilatation of the aortic root, measuring 40 mm.  7. The inferior vena cava is dilated in size with >50% respiratory variability, suggesting right atrial pressure of 8 mmHg. FINDINGS  Left Ventricle: Left ventricular ejection fraction, by estimation, is 50 to 55%. The left ventricle has low normal function. The left ventricle has no regional wall motion abnormalities. The left ventricular internal cavity size was mildly dilated. There is mild left ventricular hypertrophy. Left ventricular diastolic parameters were normal. Right Ventricle: The right ventricular size is normal. No increase in right ventricular wall thickness. Right ventricular systolic function is normal. Left Atrium: Left atrial size was moderately dilated. Right Atrium: Right atrial size was normal in size. Pericardium: There is no evidence of pericardial effusion. Mitral Valve: The mitral valve is degenerative in appearance. There is mild thickening of the mitral valve leaflet(s). Severe mitral annular calcification. Trivial mitral valve regurgitation. No evidence of mitral valve stenosis. Tricuspid Valve: The tricuspid valve is normal in structure. Tricuspid valve regurgitation is not demonstrated. No evidence of tricuspid  stenosis. Aortic Valve: 23 mm Sapien 3 stented TAVR valve Since TTE 08/05/22 gradients fairly stable from mean 17-19.6 mmHg and peak 30.4 ->33.4 mmHg No signficant PVL. The aortic valve has been repaired/replaced. Aortic valve regurgitation is not visualized. No aortic stenosis is present. Aortic valve mean gradient measures 19.6 mmHg. Aortic valve peak gradient measures 33.4 mmHg. Aortic valve area,  by VTI measures 0.82 cm. Pulmonic Valve: The pulmonic valve was normal in structure. Pulmonic valve regurgitation is not visualized. No evidence of pulmonic stenosis. Aorta: Aortic dilatation noted. There is dilatation of the aortic root, measuring 40 mm. Venous: The inferior vena cava is dilated in size with greater than 50% respiratory variability, suggesting right atrial pressure of 8 mmHg. IAS/Shunts: No atrial level shunt detected by color flow Doppler.  LEFT VENTRICLE PLAX 2D LVIDd:         5.80 cm   Diastology LVIDs:         3.80 cm   LV e' medial:    4.79 cm/s LV PW:         1.20 cm   LV E/e' medial:  34.4 LV IVS:        1.30 cm   LV e' lateral:   6.31 cm/s LVOT diam:     1.70 cm   LV E/e' lateral: 26.1 LV SV:         56 LV SV Index:   25 LVOT Area:     2.27 cm  RIGHT VENTRICLE RV S prime:     11.40 cm/s TAPSE (M-mode): 1.5 cm LEFT ATRIUM              Index        RIGHT ATRIUM           Index LA diam:        5.10 cm  2.30 cm/m   RA Area:     20.50 cm LA Vol (A2C):   131.0 ml 59.15 ml/m  RA Volume:   58.90 ml  26.60 ml/m LA Vol (A4C):   119.0 ml 53.74 ml/m LA Biplane Vol: 125.0 ml 56.44 ml/m  AORTIC VALVE AV Area (Vmax):    0.72 cm AV Area (Vmean):   0.68 cm AV Area (VTI):     0.82 cm AV Vmax:           289.00 cm/s AV Vmean:          207.400 cm/s AV VTI:            0.678 m AV Peak Grad:      33.4 mmHg AV Mean Grad:      19.6 mmHg LVOT Vmax:         91.70 cm/s LVOT Vmean:        61.900 cm/s LVOT VTI:          0.245 m LVOT/AV VTI ratio: 0.36  AORTA Ao Root diam: 4.00 cm MITRAL VALVE MV Area (PHT): 3.60  cm     SHUNTS MV Decel Time: 211 msec     Systemic VTI:  0.24 m MR Peak grad: 123.2 mmHg    Systemic Diam: 1.70 cm MR Mean grad: 74.0 mmHg MR Vmax:      555.00 cm/s MR Vmean:     395.0 cm/s MV E velocity: 165.00 cm/s MV A velocity: 104.00 cm/s MV E/A ratio:  1.59 Maude Emmer MD Electronically signed by Maude Emmer MD Signature Date/Time: 01/05/2023/3:17:31 PM    Final    DG Chest Portable 1 View Result Date: 01/05/2023 CLINICAL DATA:  SOB EXAM: PORTABLE CHEST 1 VIEW COMPARISON:  Chest x-ray 08/04/2022 trauma chest x-ray 03/17/2022 FINDINGS: The heart and mediastinal contours are unchanged. Aortic calcification. No focal consolidation. Increased interstitial markings. No pleural effusion. No pneumothorax. No acute osseous abnormality. IMPRESSION: 1. Increased interstitial markings. Could represent bronchitic changes versus pulmonary edema. 2.  Aortic Atherosclerosis (ICD10-I70.0).  Electronically Signed   By: Morgane  Naveau M.D.   On: 01/05/2023 03:29    Alm Schneider, DO  [facility]  If 7PM-7AM, please contact night-coverage [email or web address] Password [email or web address] 01/06/2023, 5:42 PM   LOS: 1 day

## 2023-01-06 NOTE — Progress Notes (Signed)
   01/06/23 0900  ReDS Vest / Clip  Station Marker D  Ruler Value 40  Anatomical Comments unable to get reds clip. MD notifed. tried 3x.

## 2023-01-06 NOTE — Plan of Care (Signed)
  Problem: Education: Goal: Knowledge of General Education information will improve Description: Including pain rating scale, medication(s)/side effects and non-pharmacologic comfort measures Outcome: Progressing   Problem: Clinical Measurements: Goal: Diagnostic test results will improve Outcome: Progressing   Problem: Health Behavior/Discharge Planning: Goal: Ability to manage health-related needs will improve Outcome: Adequate for Discharge   Problem: Clinical Measurements: Goal: Will remain free from infection Outcome: Adequate for Discharge Goal: Respiratory complications will improve Outcome: Adequate for Discharge

## 2023-01-06 NOTE — Plan of Care (Signed)

## 2023-01-06 NOTE — Progress Notes (Signed)
 SATURATION QUALIFICATIONS:  Patient Saturations on Room Air at Rest = 95%  Patient Saturations on Room Air while Ambulating = 90-93%  Pt walked approx. 80 feet. No oxygen needed at this time.

## 2023-01-07 DIAGNOSIS — J9601 Acute respiratory failure with hypoxia: Secondary | ICD-10-CM | POA: Diagnosis not present

## 2023-01-07 DIAGNOSIS — I5033 Acute on chronic diastolic (congestive) heart failure: Secondary | ICD-10-CM | POA: Diagnosis not present

## 2023-01-07 LAB — BASIC METABOLIC PANEL
Anion gap: 5 (ref 5–15)
BUN: 22 mg/dL (ref 8–23)
CO2: 27 mmol/L (ref 22–32)
Calcium: 9.9 mg/dL (ref 8.9–10.3)
Chloride: 106 mmol/L (ref 98–111)
Creatinine, Ser: 1.07 mg/dL (ref 0.61–1.24)
GFR, Estimated: >60 mL/min (ref >60)
Glucose, Bld: 158 mg/dL — ABNORMAL HIGH (ref 70–99)
Potassium: 3.9 mmol/L (ref 3.5–5.1)
Sodium: 138 mmol/L (ref 135–145)

## 2023-01-07 LAB — GLUCOSE, CAPILLARY
Glucose-Capillary: 150 mg/dL — ABNORMAL HIGH (ref 70–99)
Glucose-Capillary: 146 mg/dL — ABNORMAL HIGH (ref 70–99)

## 2023-01-07 MED ORDER — FUROSEMIDE 40 MG PO TABS: 40.0000 mg | ORAL_TABLET | Freq: Every day | ORAL | 1 refills | Status: DC

## 2023-01-07 MED ORDER — LEVEMIR FLEXTOUCH 100 UNIT/ML ~~LOC~~ SOPN: 20.0000 [IU] | PEN_INJECTOR | Freq: Every day | SUBCUTANEOUS | Status: DC

## 2023-01-07 MED ORDER — POTASSIUM CHLORIDE CRYS ER 10 MEQ PO TBCR: 10.0000 meq | EXTENDED_RELEASE_TABLET | Freq: Every day | ORAL | 1 refills | Status: AC

## 2023-01-07 NOTE — Evaluation (Signed)
 Physical Therapy Evaluation Patient Details Name: William [name] MRN: [id number] DOB: [birth year] Today's Date: 01/07/2023  History of Present Illness  William [name] is a 78 year old male with a history of paroxysmal atrial fibrillation, HFpEF, aortic stenosis status post TAVR 09/2021, coronary disease status post DES to RCA, hypertension, diabetes mellitus type 2, hyperlipidemia, CKD stage III, left bundle branch block, BPH presenting with shortness of breath that woke him from sleep on the early morning 01/05/2023.  The patient denies any chest discomfort but he has had a nonproductive cough.  He has had some subjective fevers and chills and myalgias.  He denies any hemoptysis.  He denies any nausea, vomiting, abdominal pain.  He has had some loose stools without any hematochezia or melena.  There is no dysuria or hematuria.  The patient was recently admitted to the hospital from 08/04/2022 to 08/05/2022 for atrial fibrillation with RVR.  His metoprolol  was increased to 37.5 mg twice daily at that time.  His furosemide  was stopped at that time because of AKI.  The patient states that he has not been on any furosemide  since that hospitalization.   Clinical Impression  Patient functioning near baseline for functional mobility and ambulation. Patient is able to transfer to standing and ambulate without AD, very mild unsteadiness and decreased foot clearance but no loss of balance. Patient returned to room at end of session. Patient discharged to care of nursing for ambulation daily as tolerated for length of stay.       If plan is discharge home, recommend the following:     Can travel by private vehicle        Equipment Recommendations None recommended by PT  Recommendations for Other Services       Functional Status Assessment Patient has had a recent decline in their functional status and demonstrates the ability to make significant improvements in function in a reasonable and predictable amount  of time.     Precautions / Restrictions Precautions Precautions: Fall Restrictions Weight Bearing Restrictions Per Provider Order: No      Mobility  Bed Mobility               General bed mobility comments: patient seated at EOB at beginning of session    Transfers Overall transfer level: Independent Equipment used: None                    Ambulation/Gait Ambulation/Gait assistance: Modified independent (Device/Increase time) Gait Distance (Feet): 80 Feet Assistive device: None Gait Pattern/deviations: Step-through pattern, Decreased stride length Gait velocity: decreased     General Gait Details: ambulates with slow cadence with decreased foot clearance, no AD required, very mild unsteadiness but no loss of balance  Stairs            Wheelchair Mobility     Tilt Bed    Modified Rankin (Stroke Patients Only)       Balance Overall balance assessment: Mild deficits observed, not formally tested                                           Pertinent Vitals/Pain Pain Assessment Pain Assessment: No/denies pain    Home Living Family/patient expects to be discharged to:: Private residence Living Arrangements: Alone   Type of Home: Mobile home Home Access: Stairs to enter Entrance Stairs-Rails: None Entrance Stairs-Number of Steps: 1   Home  Layout: One level Home Equipment: Rollator (4 wheels);Grab bars - tub/shower;Grab bars - toilet;Other (comment) (walking stick)      Prior Function Prior Level of Function : Independent/Modified Independent             Mobility Comments: states community ambulation without AD, walking stick PRN ADLs Comments: independent     Extremity/Trunk Assessment   Upper Extremity Assessment Upper Extremity Assessment: Overall WFL for tasks assessed    Lower Extremity Assessment Lower Extremity Assessment: Overall WFL for tasks assessed       Communication    Communication Communication: No apparent difficulties  Cognition Arousal: Alert Behavior During Therapy: WFL for tasks assessed/performed Overall Cognitive Status: Within Functional Limits for tasks assessed                                          General Comments      Exercises     Assessment/Plan    PT Assessment Patient does not need any further PT services  PT Problem List         PT Treatment Interventions      PT Goals (Current goals can be found in the Care Plan section)  Acute Rehab PT Goals Patient Stated Goal: return home PT Goal Formulation: With patient Time For Goal Achievement: 01/07/23 Potential to Achieve Goals: Good    Frequency       Co-evaluation               AM-PAC PT 6 Clicks Mobility  Outcome Measure Help needed turning from your back to your side while in a flat bed without using bedrails?: None Help needed moving from lying on your back to sitting on the side of a flat bed without using bedrails?: None Help needed moving to and from a bed to a chair (including a wheelchair)?: None Help needed standing up from a chair using your arms (e.g., wheelchair or bedside chair)?: None Help needed to walk in hospital room?: None Help needed climbing 3-5 steps with a railing? : None 6 Click Score: 24    End of Session   Activity Tolerance: Patient tolerated treatment well Patient left: in bed;with call bell/phone within reach Nurse Communication: Mobility status PT Visit Diagnosis: Other abnormalities of gait and mobility (R26.89)    Time: 9159-9150 PT Time Calculation (min) (ACUTE ONLY): 9 min   Charges:   PT Evaluation $PT Eval Low Complexity: 1 Low   PT General Charges $$ ACUTE PT VISIT: 1 Visit         9:27 AM, 01/07/23 Prentice CANDIE Stains PT, DPT Physical Therapist at [facility]

## 2023-01-07 NOTE — Discharge Instructions (Signed)
IMPORTANT INFORMATION: PAY CLOSE ATTENTION  ? ?PHYSICIAN DISCHARGE INSTRUCTIONS ? ?Follow with Primary care provider  Fusco, Lawrence, MD  and other consultants as instructed by your Hospitalist Physician ? ?SEEK MEDICAL CARE OR RETURN TO EMERGENCY ROOM IF SYMPTOMS COME BACK, WORSEN OR NEW PROBLEM DEVELOPS  ? ?Please note: ?You were cared for by a hospitalist during your hospital stay. Every effort will be made to forward records to your primary care provider.  You can request that your primary care provider send for your hospital records if they have not received them.  Once you are discharged, your primary care physician will handle any further medical issues. Please note that NO REFILLS for any discharge medications will be authorized once you are discharged, as it is imperative that you return to your primary care physician (or establish a relationship with a primary care physician if you do not have one) for your post hospital discharge needs so that they can reassess your need for medications and monitor your lab values. ? ?Please get a complete blood count and chemistry panel checked by your Primary MD at your next visit, and again as instructed by your Primary MD. ? ?Get Medicines reviewed and adjusted: ?Please take all your medications with you for your next visit with your Primary MD ? ?Laboratory/radiological data: ?Please request your Primary MD to go over all hospital tests and procedure/radiological results at the follow up, please ask your primary care provider to get all Hospital records sent to his/her office. ? ?In some cases, they will be blood work, cultures and biopsy results pending at the time of your discharge. Please request that your primary care provider follow up on these results. ? ?If you are diabetic, please bring your blood sugar readings with you to your follow up appointment with primary care.   ? ?Please call and make your follow up appointments as soon as possible.   ? ?Also Note  the following: ?If you experience worsening of your admission symptoms, develop shortness of breath, life threatening emergency, suicidal or homicidal thoughts you must seek medical attention immediately by calling 911 or calling your MD immediately  if symptoms less severe. ? ?You must read complete instructions/literature along with all the possible adverse reactions/side effects for all the Medicines you take and that have been prescribed to you. Take any new Medicines after you have completely understood and accpet all the possible adverse reactions/side effects.  ? ?Do not drive when taking Pain medications or sleeping medications (Benzodiazepines) ? ?Do not take more than prescribed Pain, Sleep and Anxiety Medications. It is not advisable to combine anxiety,sleep and pain medications without talking with your primary care practitioner ? ?Special Instructions: If you have smoked or chewed Tobacco  in the last 2 yrs please stop smoking, stop any regular Alcohol  and or any Recreational drug use. ? ?Wear Seat belts while driving.  Do not drive if taking any narcotic, mind altering or controlled substances or recreational drugs or alcohol.  ? ? ? ? ? ?

## 2023-01-07 NOTE — Discharge Summary (Signed)
 Physician Discharge Summary  William [name] FMW:[id number] DOB: 08-30-1945 DOA: 01/05/2023  PCP: Bertell Satterfield, MD Cardiology: [facility]  Admit date: 01/05/2023 Discharge date: 01/07/2023  Admitted From:  home  Disposition: Home   Recommendations for Outpatient Follow-up:  Follow up with PCP in 1 weeks Follow up with cardiology on 01/09/23 as scheduled  Please obtain BMP in one week to follow electrolytes  Discharge Condition: STABLE   CODE STATUS: FULL DIET: Heart Healthy    Brief Hospitalization Summary: Please see all hospital notes, images, labs for full details of the hospitalization. Admission provider HPI:  78 year old male with a history of paroxysmal atrial fibrillation, HFpEF, aortic stenosis status post TAVR 09/2021, coronary disease status post DES to RCA, hypertension, diabetes mellitus type 2, hyperlipidemia, CKD stage III, left bundle branch block, BPH presenting with shortness of breath that woke him from sleep on the early morning 01/05/2023. The patient denies any chest discomfort but he has had a nonproductive cough.  He has had some subjective fevers and chills and myalgias.  He denies any hemoptysis.  He denies any nausea, vomiting, abdominal pain.  He has had some loose stools without any hematochezia or melena.  There is no dysuria or hematuria. The patient was recently admitted to the hospital from 08/04/2022 to 08/05/2022 for atrial fibrillation with RVR.  His metoprolol  was increased to 37.5 mg twice daily at that time.  His furosemide  was stopped at that time because of AKI.  The patient states that he has not been on any furosemide  since that hospitalization.  In the ED, the patient was afebrile and hemodynamically stable with oxygen  saturation 91% room air.  He was placed on 2 L with saturation 95%.  WBC 8.4, hemoglobin 12.3, platelets 209.  Sodium 146, potassium 3.9, bicarbonate 25, serum creatinine 1.01.  Troponin 27>> 29.  Chest x-ray showed increased  interstitial markings and vascular congestion.  EKG showed sinus rhythm with left bundle branch block.  COVID-19 PCR is negative.  The patient was given a dose of intravenous furosemide  and admitted for further evaluation and treatment of his fluid overload.  Hospital Course by problem List  Acute on chronic HFpEF -remains clinically fluid overloaded -Continue IV furosemide  -Daily weights -Accurate I's and Os--incomplete -Obtain ReDS vest--poor reading -08/05/2022 echo EF 55 to 60%, moderate LVH, normal RVF, mild MR, status post TAVR -12/30 Echo EF 50-55%, no WMA, normal RVF He has diuresed nearly 5L since admission  Filed Weights   01/05/23 0918 01/06/23 0512 01/07/23 0443  Weight: 106.8 kg 107.4 kg 107 kg    Intake/Output Summary (Last 24 hours) at 01/07/2023 1122 Last data filed at 01/07/2023 9050 Gross per 24 hour  Intake 240 ml  Output 3600 ml  Net -3360 ml   - DC home on oral lasix  40 mg daily with potassium supplement - follow up with cardiology on 01/09/23 as scheduled with Dr. Delford.    Paroxysmal atrial fibrillation -Currently in sinus rhythm -Continue apixaban  -Continue metoprolol  tartrate   Acute respiratory failure with hypoxia -stable on 2L -wean to room air -COVID/RSV/Flu--neg   Coronary artery disease -s/p DES to distal RCA in 08/2013, PCI/DES to LAD in 07/2021  -No chest pain presently -His Plavix  was stopped last hospital admission as it had been 1 year since his stent -Continue apixaban  -Continue statin   CKD stage IIIa -Baseline creatinine 1.3-1.6 -Monitor with diuresis   Diabetes mellitus type 2, uncontrolled with hyperglycemia -08/05/2022 hemoglobin A1c 7.7 -Start reduced dose Semglee  -NovoLog  sliding scale -  Repeat hemoglobin A1c -Holding metformin  and glipizide  in hospital -reduced dose of levemir  and discontinued glipizide  in hospital    Opioid dependence -PDMP reviewed -Patient receives hydrocodone  10/325, #180, last refill 12/03/2022    Essential hypertension -Continue metoprolol  tartrate -resume home meds at discharge    Hyperlipidemia -Continue statin   BPH -Continue finasteride    Left bundle branch block -stable cardiac monitoring in hospital   Obesity -BMI 33.97 -Lifestyle modification   Discharge Diagnoses:  Principal Problem:   Acute on chronic diastolic CHF (congestive heart failure) (HCC) Active Problems:   Other hyperparathyroidism (HCC)   LBBB (left bundle branch block)   Acute respiratory failure with hypoxia [facility])   Discharge Instructions:  Allergies as of 01/07/2023   No Known Allergies      Medication List     STOP taking these medications    clopidogrel  75 MG tablet Commonly known as: PLAVIX    glipiZIDE  10 MG tablet Commonly known as: GLUCOTROL    tamsulosin  0.4 MG Caps capsule Commonly known as: FLOMAX        TAKE these medications    acarbose  100 MG tablet Commonly known as: PRECOSE  Take 100 mg by mouth 3 (three) times daily.   alendronate  70 MG tablet Commonly known as: FOSAMAX  Take 1 tablet (70 mg total) by mouth every 7 (seven) days. Take with a full glass of water  on an empty stomach. What changed:  when to take this additional instructions   alfuzosin  10 MG 24 hr tablet Commonly known as: UROXATRAL  Take 10 mg by mouth every evening.   amLODipine  5 MG tablet Commonly known as: NORVASC  TAKE 1 TABLET(5 MG) BY MOUTH DAILY What changed: See the new instructions.   COQ10 PO Take 1 capsule by mouth every evening.   Eliquis  5 MG Tabs tablet Generic drug: apixaban  TAKE 1 TABLET(5 MG) BY MOUTH TWICE DAILY   Farxiga  10 MG Tabs tablet Generic drug: dapagliflozin  propanediol Take 10 mg by mouth in the morning.   finasteride  5 MG tablet Commonly known as: PROSCAR  Take 1 tablet (5 mg total) by mouth daily. What changed: when to take this   furosemide  40 MG tablet Commonly known as: Lasix  Take 1 tablet (40 mg total) by mouth daily. Start taking on:  January 08, 2023   GLUCOSAMINE COMPLEX PO Take 1 tablet by mouth every evening.   HYDROcodone -acetaminophen  10-325 MG tablet Commonly known as: NORCO Take 1 tablet by mouth every 6 (six) hours as needed for moderate pain.   Levemir  FlexTouch 100 UNIT/ML FlexTouch Pen Generic drug: insulin  detemir Inject 20 Units into the skin at bedtime. What changed: how much to take   MAGNESIUM  PO Take 1 tablet by mouth every evening.   metFORMIN  1000 MG tablet Commonly known as: GLUCOPHAGE  Take 1,000 mg by mouth 2 (two) times daily.   Metoprolol  Tartrate 37.5 MG Tabs Take 1 tablet (37.5 mg total) by mouth 2 (two) times daily.   NAC PO Take 1 tablet by mouth every evening.   nitroGLYCERIN  0.4 MG SL tablet Commonly known as: NITROSTAT  Place 1 tablet (0.4 mg total) under the tongue every 5 (five) minutes x 3 doses as needed for chest pain (if no relief after 2nd dose, proceed to the ED for an evalution or call 911).   pantoprazole  40 MG tablet Commonly known as: PROTONIX  Take 1 tablet (40 mg total) by mouth every evening.   potassium chloride  10 MEQ tablet Commonly known as: KLOR-CON  M Take 1 tablet (10 mEq total) by mouth daily. Start  taking on: January 08, 2023   potassium citrate  5 MEQ (540 MG) SR tablet Commonly known as: UROCIT-K  Take 1 tablet (5 mEq total) by mouth 3 (three) times daily with meals.   rosuvastatin  20 MG tablet Commonly known as: CRESTOR  Take 1 tablet (20 mg total) by mouth daily. What changed: when to take this   silodosin  8 MG Caps capsule Commonly known as: RAPAFLO  Take 1 capsule (8 mg total) by mouth daily with breakfast.   tiZANidine  4 MG tablet Commonly known as: ZANAFLEX  Take 4 mg by mouth every 6 (six) hours as needed for muscle spasms.   VITAMIN B-12 PO Take 1 tablet by mouth every evening.   VITAMIN C  PO Take 1 tablet by mouth every evening.   VITAMIN D -3 PO Take 1 tablet by mouth every evening.   VITAMIN K2  PO Take 1 tablet by mouth  every evening.   ZINC  PO Take 1 tablet by mouth every evening.        Follow-up Information     Bertell Satterfield, MD. Schedule an appointment as soon as possible for a visit in 1 week(s).   Specialty: Internal Medicine Why: Hospital Follow Up Contact information: [address] [place] [address] [phone]         Delford Maude BROCKS, MD. Go on 01/09/2023.   Specialty: Cardiology Why: Hospital Follow Up Contact information: [address] [place] [address] [phone]         Sherrilee Belvie CROME, MD. Schedule an appointment as soon as possible for a visit in 2 week(s).   Specialty: Urology Why: Hospital Follow Up Contact information: [address]  [address] [place] [address] [phone]                No Known Allergies Allergies as of 01/07/2023   No Known Allergies      Medication List     STOP taking these medications    clopidogrel  75 MG tablet Commonly known as: PLAVIX    glipiZIDE  10 MG tablet Commonly known as: GLUCOTROL    tamsulosin  0.4 MG Caps capsule Commonly known as: FLOMAX        TAKE these medications    acarbose  100 MG tablet Commonly known as: PRECOSE  Take 100 mg by mouth 3 (three) times daily.   alendronate  70 MG tablet Commonly known as: FOSAMAX  Take 1 tablet (70 mg total) by mouth every 7 (seven) days. Take with a full glass of water  on an empty stomach. What changed:  when to take this additional instructions   alfuzosin  10 MG 24 hr tablet Commonly known as: UROXATRAL  Take 10 mg by mouth every evening.   amLODipine  5 MG tablet Commonly known as: NORVASC  TAKE 1 TABLET(5 MG) BY MOUTH DAILY What changed: See the new instructions.   COQ10 PO Take 1 capsule by mouth every evening.   Eliquis  5 MG Tabs tablet Generic drug: apixaban  TAKE 1 TABLET(5 MG) BY MOUTH TWICE DAILY   Farxiga  10 MG Tabs tablet Generic drug: dapagliflozin  propanediol Take 10 mg by mouth in the morning.   finasteride   5 MG tablet Commonly known as: PROSCAR  Take 1 tablet (5 mg total) by mouth daily. What changed: when to take this   furosemide  40 MG tablet Commonly known as: Lasix  Take 1 tablet (40 mg total) by mouth daily. Start taking on: January 08, 2023   GLUCOSAMINE COMPLEX PO Take 1 tablet by mouth every evening.   HYDROcodone -acetaminophen  10-325 MG tablet Commonly known as: NORCO Take 1 tablet by  mouth every 6 (six) hours as needed for moderate pain.   Levemir  FlexTouch 100 UNIT/ML FlexTouch Pen Generic drug: insulin  detemir Inject 20 Units into the skin at bedtime. What changed: how much to take   MAGNESIUM  PO Take 1 tablet by mouth every evening.   metFORMIN  1000 MG tablet Commonly known as: GLUCOPHAGE  Take 1,000 mg by mouth 2 (two) times daily.   Metoprolol  Tartrate 37.5 MG Tabs Take 1 tablet (37.5 mg total) by mouth 2 (two) times daily.   NAC PO Take 1 tablet by mouth every evening.   nitroGLYCERIN  0.4 MG SL tablet Commonly known as: NITROSTAT  Place 1 tablet (0.4 mg total) under the tongue every 5 (five) minutes x 3 doses as needed for chest pain (if no relief after 2nd dose, proceed to the ED for an evalution or call 911).   pantoprazole  40 MG tablet Commonly known as: PROTONIX  Take 1 tablet (40 mg total) by mouth every evening.   potassium chloride  10 MEQ tablet Commonly known as: KLOR-CON  M Take 1 tablet (10 mEq total) by mouth daily. Start taking on: January 08, 2023   potassium citrate  5 MEQ (540 MG) SR tablet Commonly known as: UROCIT-K  Take 1 tablet (5 mEq total) by mouth 3 (three) times daily with meals.   rosuvastatin  20 MG tablet Commonly known as: CRESTOR  Take 1 tablet (20 mg total) by mouth daily. What changed: when to take this   silodosin  8 MG Caps capsule Commonly known as: RAPAFLO  Take 1 capsule (8 mg total) by mouth daily with breakfast.   tiZANidine  4 MG tablet Commonly known as: ZANAFLEX  Take 4 mg by mouth every 6 (six) hours as needed for  muscle spasms.   VITAMIN B-12 PO Take 1 tablet by mouth every evening.   VITAMIN C  PO Take 1 tablet by mouth every evening.   VITAMIN D -3 PO Take 1 tablet by mouth every evening.   VITAMIN K2  PO Take 1 tablet by mouth every evening.   ZINC  PO Take 1 tablet by mouth every evening.       Procedures/Studies: ECHOCARDIOGRAM COMPLETE Result Date: 01/05/2023    ECHOCARDIOGRAM REPORT   Patient Name:   William [name] Date of Exam: 01/05/2023 Medical Rec #:  [id number]     Height:       69.0 in Accession #:    [id number]    Weight:       235.5 lb Date of Birth:  [birth year]     BSA:          2.215 m Patient Age:    77 years      BP:           161/70 mmHg Patient Gender: M             HR:           75 bpm. Exam Location:  [facility] Procedure: 2D Echo, Cardiac Doppler and Color Doppler Indications:    CHF-Acute Diastolic I50.31  History:        Patient has prior history of Echocardiogram examinations, most                 recent 08/05/2022. Arrythmias:LBBB; Risk Factors:Hypertension,                 Diabetes, Dyslipidemia and Former Smoker. Hx of TAVR.  Sonographer:    Aida Pizza RCS Referring Phys: [phone] DAVID TAT IMPRESSIONS  1. Left ventricular ejection fraction, by estimation, is 50 to 55%. The left  ventricle has low normal function. The left ventricle has no regional wall motion abnormalities. The left ventricular internal cavity size was mildly dilated. There is mild left ventricular hypertrophy. Left ventricular diastolic parameters were normal.  2. Right ventricular systolic function is normal. The right ventricular size is normal.  3. Left atrial size was moderately dilated.  4. The mitral valve is degenerative. Trivial mitral valve regurgitation. No evidence of mitral stenosis. Severe mitral annular calcification.  5. 23 mm Sapien 3 stented TAVR valve Since TTE 08/05/22 gradients fairly stable from mean 17-19.6 mmHg and peak 30.4 ->33.4 mmHg No signficant PVL . The aortic valve has been  repaired/replaced. Aortic valve regurgitation is not visualized. No aortic stenosis is present.  6. Aortic dilatation noted. There is dilatation of the aortic root, measuring 40 mm.  7. The inferior vena cava is dilated in size with >50% respiratory variability, suggesting right atrial pressure of 8 mmHg. FINDINGS  Left Ventricle: Left ventricular ejection fraction, by estimation, is 50 to 55%. The left ventricle has low normal function. The left ventricle has no regional wall motion abnormalities. The left ventricular internal cavity size was mildly dilated. There is mild left ventricular hypertrophy. Left ventricular diastolic parameters were normal. Right Ventricle: The right ventricular size is normal. No increase in right ventricular wall thickness. Right ventricular systolic function is normal. Left Atrium: Left atrial size was moderately dilated. Right Atrium: Right atrial size was normal in size. Pericardium: There is no evidence of pericardial effusion. Mitral Valve: The mitral valve is degenerative in appearance. There is mild thickening of the mitral valve leaflet(s). Severe mitral annular calcification. Trivial mitral valve regurgitation. No evidence of mitral valve stenosis. Tricuspid Valve: The tricuspid valve is normal in structure. Tricuspid valve regurgitation is not demonstrated. No evidence of tricuspid stenosis. Aortic Valve: 23 mm Sapien 3 stented TAVR valve Since TTE 08/05/22 gradients fairly stable from mean 17-19.6 mmHg and peak 30.4 ->33.4 mmHg No signficant PVL. The aortic valve has been repaired/replaced. Aortic valve regurgitation is not visualized. No aortic stenosis is present. Aortic valve mean gradient measures 19.6 mmHg. Aortic valve peak gradient measures 33.4 mmHg. Aortic valve area, by VTI measures 0.82 cm. Pulmonic Valve: The pulmonic valve was normal in structure. Pulmonic valve regurgitation is not visualized. No evidence of pulmonic stenosis. Aorta: Aortic dilatation noted.  There is dilatation of the aortic root, measuring 40 mm. Venous: The inferior vena cava is dilated in size with greater than 50% respiratory variability, suggesting right atrial pressure of 8 mmHg. IAS/Shunts: No atrial level shunt detected by color flow Doppler.  LEFT VENTRICLE PLAX 2D LVIDd:         5.80 cm   Diastology LVIDs:         3.80 cm   LV e' medial:    4.79 cm/s LV PW:         1.20 cm   LV E/e' medial:  34.4 LV IVS:        1.30 cm   LV e' lateral:   6.31 cm/s LVOT diam:     1.70 cm   LV E/e' lateral: 26.1 LV SV:         56 LV SV Index:   25 LVOT Area:     2.27 cm  RIGHT VENTRICLE RV S prime:     11.40 cm/s TAPSE (M-mode): 1.5 cm LEFT ATRIUM              Index        RIGHT ATRIUM  Index LA diam:        5.10 cm  2.30 cm/m   RA Area:     20.50 cm LA Vol (A2C):   131.0 ml 59.15 ml/m  RA Volume:   58.90 ml  26.60 ml/m LA Vol (A4C):   119.0 ml 53.74 ml/m LA Biplane Vol: 125.0 ml 56.44 ml/m  AORTIC VALVE AV Area (Vmax):    0.72 cm AV Area (Vmean):   0.68 cm AV Area (VTI):     0.82 cm AV Vmax:           289.00 cm/s AV Vmean:          207.400 cm/s AV VTI:            0.678 m AV Peak Grad:      33.4 mmHg AV Mean Grad:      19.6 mmHg LVOT Vmax:         91.70 cm/s LVOT Vmean:        61.900 cm/s LVOT VTI:          0.245 m LVOT/AV VTI ratio: 0.36  AORTA Ao Root diam: 4.00 cm MITRAL VALVE MV Area (PHT): 3.60 cm     SHUNTS MV Decel Time: 211 msec     Systemic VTI:  0.24 m MR Peak grad: 123.2 mmHg    Systemic Diam: 1.70 cm MR Mean grad: 74.0 mmHg MR Vmax:      555.00 cm/s MR Vmean:     395.0 cm/s MV E velocity: 165.00 cm/s MV A velocity: 104.00 cm/s MV E/A ratio:  1.59 Maude Emmer MD Electronically signed by Maude Emmer MD Signature Date/Time: 01/05/2023/3:17:31 PM    Final    DG Chest Portable 1 View Result Date: 01/05/2023 CLINICAL DATA:  SOB EXAM: PORTABLE CHEST 1 VIEW COMPARISON:  Chest x-ray 08/04/2022 trauma chest x-ray 03/17/2022 FINDINGS: The heart and mediastinal contours are unchanged.  Aortic calcification. No focal consolidation. Increased interstitial markings. No pleural effusion. No pneumothorax. No acute osseous abnormality. IMPRESSION: 1. Increased interstitial markings. Could represent bronchitic changes versus pulmonary edema. 2.  Aortic Atherosclerosis (ICD10-I70.0). Electronically Signed   By: Morgane  Naveau M.D.   On: 01/05/2023 03:29     Subjective: Pt feels much better after diuresing nearly 5L since admission.  He is no longer SOB, he is ambulating the hallways in the hospital.  No complaints.   Discharge Exam: Vitals:   01/07/23 0443 01/07/23 0825  BP: 129/73 (!) 138/59  Pulse: 64 60  Resp:    Temp: (!) 97.5 F (36.4 C)   SpO2: 92%    Vitals:   01/06/23 2002 01/06/23 2102 01/07/23 0443 01/07/23 0825  BP: (!) 138/57 (!) 144/56 129/73 (!) 138/59  Pulse: 66 71 64 60  Resp: 19 20    Temp: 98.1 F (36.7 C) (!) 97.5 F (36.4 C) (!) 97.5 F (36.4 C)   TempSrc: Oral Oral Oral   SpO2: 96% 92% 92%   Weight:   107 kg   Height:       General: Pt is alert, awake, not in acute distress Cardiovascular: normal S1/S2 +, no rubs, no gallops Respiratory: CTA bilaterally, no wheezing, no rhonchi Abdominal: Soft, NT, ND, bowel sounds + Extremities: no edema, no cyanosis   The results of significant diagnostics from this hospitalization (including imaging, microbiology, ancillary and laboratory) are listed below for reference.     Microbiology: Recent Results (from the past 240 hours)  Resp panel by RT-PCR (RSV, Flu A&B, Covid) Anterior Nasal  Swab     Status: None   Collection Time: 01/05/23  3:34 AM   Specimen: Anterior Nasal Swab  Result Value Ref Range Status   SARS Coronavirus 2 by RT PCR NEGATIVE NEGATIVE Final    Comment: (NOTE) SARS-CoV-2 target nucleic acids are NOT DETECTED.  The SARS-CoV-2 RNA is generally detectable in upper respiratory specimens during the acute phase of infection. The lowest concentration of SARS-CoV-2 viral copies this  assay can detect is 138 copies/mL. A negative result does not preclude SARS-Cov-2 infection and should not be used as the sole basis for treatment or other patient management decisions. A negative result may occur with  improper specimen collection/handling, submission of specimen other than nasopharyngeal swab, presence of viral mutation(s) within the areas targeted by this assay, and inadequate number of viral copies(<138 copies/mL). A negative result must be combined with clinical observations, patient history, and epidemiological information. The expected result is Negative.  Fact Sheet for Patients:  bloggercourse.com  Fact Sheet for Healthcare Providers:  seriousbroker.it  This test is no t yet approved or cleared by the [place]  FDA and  has been authorized for detection and/or diagnosis of SARS-CoV-2 by FDA under an Emergency Use Authorization (EUA). This EUA will remain  in effect (meaning this test can be used) for the duration of the COVID-19 declaration under Section 564(b)(1) of the Act, 21 U.S.C.section 360bbb-3(b)(1), unless the authorization is terminated  or revoked sooner.       Influenza A by PCR NEGATIVE NEGATIVE Final   Influenza B by PCR NEGATIVE NEGATIVE Final    Comment: (NOTE) The Xpert Xpress SARS-CoV-2/FLU/RSV plus assay is intended as an aid in the diagnosis of influenza from Nasopharyngeal swab specimens and should not be used as a sole basis for treatment. Nasal washings and aspirates are unacceptable for Xpert Xpress SARS-CoV-2/FLU/RSV testing.  Fact Sheet for Patients: bloggercourse.com  Fact Sheet for Healthcare Providers: seriousbroker.it  This test is not yet approved or cleared by the [place]  FDA and has been authorized for detection and/or diagnosis of SARS-CoV-2 by FDA under an Emergency Use Authorization (EUA). This EUA will  remain in effect (meaning this test can be used) for the duration of the COVID-19 declaration under Section 564(b)(1) of the Act, 21 U.S.C. section 360bbb-3(b)(1), unless the authorization is terminated or revoked.     Resp Syncytial Virus by PCR NEGATIVE NEGATIVE Final    Comment: (NOTE) Fact Sheet for Patients: bloggercourse.com  Fact Sheet for Healthcare Providers: seriousbroker.it  This test is not yet approved or cleared by the [place]  FDA and has been authorized for detection and/or diagnosis of SARS-CoV-2 by FDA under an Emergency Use Authorization (EUA). This EUA will remain in effect (meaning this test can be used) for the duration of the COVID-19 declaration under Section 564(b)(1) of the Act, 21 U.S.C. section 360bbb-3(b)(1), unless the authorization is terminated or revoked.  Performed at [facility], [address], [place], [place] [address]   MRSA Next Gen by PCR, Nasal     Status: None   Collection Time: 01/06/23 11:50 AM   Specimen: Nasal Mucosa; Nasal Swab  Result Value Ref Range Status   MRSA by PCR Next Gen NOT DETECTED NOT DETECTED Final    Comment: (NOTE) The GeneXpert MRSA Assay (FDA approved for NASAL specimens only), is one component of a comprehensive MRSA colonization surveillance program. It is not intended to diagnose MRSA infection nor to guide or monitor treatment for MRSA infections. Test performance is not FDA  approved in patients less than 64 years old. Performed at [facility], [address], [place], [place] [address]      Labs: BNP (last 3 results) Recent Labs    03/17/22 1504 01/05/23 0314  BNP 38.0 174.0*   Basic Metabolic Panel: Recent Labs  Lab 01/05/23 0313 01/06/23 0448 01/07/23 0428  NA 146* 140 138  K 3.9 4.0 3.9  CL 114* 108 106  CO2 25 26 27   GLUCOSE 83 125* 158*  BUN 11 17 22   CREATININE 1.01 1.13 1.07  CALCIUM  9.5 9.3 9.9   Liver Function Tests: No  results for input(s): AST, ALT, ALKPHOS, BILITOT, PROT, ALBUMIN in the last 168 hours. No results for input(s): LIPASE, AMYLASE in the last 168 hours. No results for input(s): AMMONIA in the last 168 hours. CBC: Recent Labs  Lab 01/05/23 0313  WBC 8.4  NEUTROABS 5.9  HGB 12.3*  HCT 39.2  MCV 94.0  PLT 209   Cardiac Enzymes: No results for input(s): CKTOTAL, CKMB, CKMBINDEX, TROPONINI in the last 168 hours. BNP: Invalid input(s): POCBNP CBG: Recent Labs  Lab 01/06/23 1146 01/06/23 1600 01/06/23 2104 01/07/23 0705 01/07/23 1100  GLUCAP 146* 173* 278* 150* 146*   D-Dimer No results for input(s): DDIMER in the last 72 hours. Hgb A1c No results for input(s): HGBA1C in the last 72 hours. Lipid Profile No results for input(s): CHOL, HDL, LDLCALC, TRIG, CHOLHDL, LDLDIRECT in the last 72 hours. Thyroid  function studies No results for input(s): TSH, T4TOTAL, T3FREE, THYROIDAB in the last 72 hours.  Invalid input(s): FREET3 Anemia work up No results for input(s): VITAMINB12, FOLATE, FERRITIN, TIBC, IRON, RETICCTPCT in the last 72 hours. Urinalysis    Component Value Date/Time   COLORURINE STRAW (A) 03/17/2022 1516   APPEARANCEUR Clear 10/29/2022 1009   LABSPEC 1.010 03/17/2022 1516   PHURINE 5.0 03/17/2022 1516   GLUCOSEU 3+ (A) 10/29/2022 1009   HGBUR NEGATIVE 03/17/2022 1516   BILIRUBINUR Negative 10/29/2022 1009   KETONESUR NEGATIVE 03/17/2022 1516   PROTEINUR Negative 10/29/2022 1009   PROTEINUR NEGATIVE 03/17/2022 1516   UROBILINOGEN 0.2 10/21/2014 2350   NITRITE Negative 10/29/2022 1009   NITRITE NEGATIVE 03/17/2022 1516   LEUKOCYTESUR Negative 10/29/2022 1009   LEUKOCYTESUR NEGATIVE 03/17/2022 1516   Sepsis Labs Recent Labs  Lab 01/05/23 0313  WBC 8.4   Microbiology Recent Results (from the past 240 hours)  Resp panel by RT-PCR (RSV, Flu A&B, Covid) Anterior Nasal Swab     Status: None    Collection Time: 01/05/23  3:34 AM   Specimen: Anterior Nasal Swab  Result Value Ref Range Status   SARS Coronavirus 2 by RT PCR NEGATIVE NEGATIVE Final    Comment: (NOTE) SARS-CoV-2 target nucleic acids are NOT DETECTED.  The SARS-CoV-2 RNA is generally detectable in upper respiratory specimens during the acute phase of infection. The lowest concentration of SARS-CoV-2 viral copies this assay can detect is 138 copies/mL. A negative result does not preclude SARS-Cov-2 infection and should not be used as the sole basis for treatment or other patient management decisions. A negative result may occur with  improper specimen collection/handling, submission of specimen other than nasopharyngeal swab, presence of viral mutation(s) within the areas targeted by this assay, and inadequate number of viral copies(<138 copies/mL). A negative result must be combined with clinical observations, patient history, and epidemiological information. The expected result is Negative.  Fact Sheet for Patients:  bloggercourse.com  Fact Sheet for Healthcare Providers:  seriousbroker.it  This test is no  t yet approved or cleared by the [place]  FDA and  has been authorized for detection and/or diagnosis of SARS-CoV-2 by FDA under an Emergency Use Authorization (EUA). This EUA will remain  in effect (meaning this test can be used) for the duration of the COVID-19 declaration under Section 564(b)(1) of the Act, 21 U.S.C.section 360bbb-3(b)(1), unless the authorization is terminated  or revoked sooner.       Influenza A by PCR NEGATIVE NEGATIVE Final   Influenza B by PCR NEGATIVE NEGATIVE Final    Comment: (NOTE) The Xpert Xpress SARS-CoV-2/FLU/RSV plus assay is intended as an aid in the diagnosis of influenza from Nasopharyngeal swab specimens and should not be used as a sole basis for treatment. Nasal washings and aspirates are unacceptable for  Xpert Xpress SARS-CoV-2/FLU/RSV testing.  Fact Sheet for Patients: bloggercourse.com  Fact Sheet for Healthcare Providers: seriousbroker.it  This test is not yet approved or cleared by the [place]  FDA and has been authorized for detection and/or diagnosis of SARS-CoV-2 by FDA under an Emergency Use Authorization (EUA). This EUA will remain in effect (meaning this test can be used) for the duration of the COVID-19 declaration under Section 564(b)(1) of the Act, 21 U.S.C. section 360bbb-3(b)(1), unless the authorization is terminated or revoked.     Resp Syncytial Virus by PCR NEGATIVE NEGATIVE Final    Comment: (NOTE) Fact Sheet for Patients: bloggercourse.com  Fact Sheet for Healthcare Providers: seriousbroker.it  This test is not yet approved or cleared by the [place]  FDA and has been authorized for detection and/or diagnosis of SARS-CoV-2 by FDA under an Emergency Use Authorization (EUA). This EUA will remain in effect (meaning this test can be used) for the duration of the COVID-19 declaration under Section 564(b)(1) of the Act, 21 U.S.C. section 360bbb-3(b)(1), unless the authorization is terminated or revoked.  Performed at [facility], [address], [place], [place] [address]   MRSA Next Gen by PCR, Nasal     Status: None   Collection Time: 01/06/23 11:50 AM   Specimen: Nasal Mucosa; Nasal Swab  Result Value Ref Range Status   MRSA by PCR Next Gen NOT DETECTED NOT DETECTED Final    Comment: (NOTE) The GeneXpert MRSA Assay (FDA approved for NASAL specimens only), is one component of a comprehensive MRSA colonization surveillance program. It is not intended to diagnose MRSA infection nor to guide or monitor treatment for MRSA infections. Test performance is not FDA approved in patients less than 77 years old. Performed at [facility], [address],  [place], [place] [address]    Time coordinating discharge: 38 mins   SIGNED:  Afton Louder, MD  [facility] 01/07/2023, 11:19 AM How to contact the [facility] Attending or Consulting provider 7A - 7P or covering provider during after hours 7P -7A, for this patient?  Check the care team in [facility] and look for a) attending/consulting TRH provider listed and b) the TRH team listed Log into [email or web address] and use [place]'s universal password to access. If you do not have the password, please contact the hospital operator. Locate the TRH provider you are looking for under [facility] and page to a number that you can be directly reached. If you still have difficulty reaching the provider, please page the [facility] (Director on Call) for the Hospitalists listed on amion for assistance.

## 2023-01-08 DIAGNOSIS — I480 Paroxysmal atrial fibrillation: Secondary | ICD-10-CM | POA: Diagnosis not present

## 2023-01-08 DIAGNOSIS — G894 Chronic pain syndrome: Secondary | ICD-10-CM | POA: Diagnosis not present

## 2023-01-08 DIAGNOSIS — Z6834 Body mass index (BMI) 34.0-34.9, adult: Secondary | ICD-10-CM | POA: Diagnosis not present

## 2023-01-08 DIAGNOSIS — I5033 Acute on chronic diastolic (congestive) heart failure: Secondary | ICD-10-CM | POA: Diagnosis not present

## 2023-01-08 DIAGNOSIS — E1165 Type 2 diabetes mellitus with hyperglycemia: Secondary | ICD-10-CM | POA: Diagnosis not present

## 2023-01-08 DIAGNOSIS — E1129 Type 2 diabetes mellitus with other diabetic kidney complication: Secondary | ICD-10-CM | POA: Diagnosis not present

## 2023-01-08 DIAGNOSIS — E6609 Other obesity due to excess calories: Secondary | ICD-10-CM | POA: Diagnosis not present

## 2023-01-08 NOTE — Consult Note (Signed)
 [facility] Liaison Note  01/08/2023  [name] 1945-04-26 [id number]  Location: RN Hospital Liaison screened the patient remotely at [facility].  Insurance: [employer] [facility]   William [name] is a 78 y.o. male who is a Primary Care Patient of Bertell Satterfield, MD The patient was screened for  readmission hospitalization with noted medium risk score for unplanned readmission risk with 2 IP in 6 months.  The patient was assessed for potential Care Management service needs for post hospital transition for care coordination. Review of patient's electronic medical record reveals patient was admitted for Acute on chronic diastolic CHF. Providers office will continue to follow up with this pt post hospital discharge. Pt discharged home with self care. No VBCI services needed at this time.    VBCI Care Management/Population Health does not replace or interfere with any arrangements made by the Inpatient Transition of Care team.   For questions contact:   Olam Ku, RN, [facility] Liaison [place]   [facility], Population Health Office Hours MTWF  8:00 am-6:00 pm Direct Dial: [phone] mobile [phone] [Office toll free line] Office Hours are M-F 8:30 - 5 pm [name]

## 2023-01-09 ENCOUNTER — Ambulatory Visit: Payer: PPO | Attending: Cardiovascular Disease | Admitting: Cardiovascular Disease

## 2023-01-09 ENCOUNTER — Encounter: Payer: Self-pay | Admitting: Cardiovascular Disease

## 2023-01-09 VITALS — BP 148/72 | HR 72 | Ht 69.0 in | Wt 226.4 lb

## 2023-01-09 DIAGNOSIS — I480 Paroxysmal atrial fibrillation: Secondary | ICD-10-CM | POA: Diagnosis not present

## 2023-01-09 DIAGNOSIS — Z952 Presence of prosthetic heart valve: Secondary | ICD-10-CM | POA: Diagnosis not present

## 2023-01-09 DIAGNOSIS — I2510 Atherosclerotic heart disease of native coronary artery without angina pectoris: Secondary | ICD-10-CM

## 2023-01-09 DIAGNOSIS — N183 Chronic kidney disease, stage 3 unspecified: Secondary | ICD-10-CM | POA: Diagnosis not present

## 2023-01-09 DIAGNOSIS — I10 Essential (primary) hypertension: Secondary | ICD-10-CM | POA: Diagnosis not present

## 2023-01-09 DIAGNOSIS — E1122 Type 2 diabetes mellitus with diabetic chronic kidney disease: Secondary | ICD-10-CM

## 2023-01-09 DIAGNOSIS — I5022 Chronic systolic (congestive) heart failure: Secondary | ICD-10-CM

## 2023-01-09 NOTE — Patient Instructions (Signed)
 Medication Instructions:  Your physician recommends that you continue on your current medications as directed. Please refer to the Current Medication list given to you today.  *If you need a refill on your cardiac medications before your next appointment, please call your pharmacy*   Lab Work: Your physician recommends that you return for lab work in: Next Week    If you have labs (blood work) drawn today and your tests are completely normal, you will receive your results only by: MyChart Message (if you have MyChart) OR A paper copy in the mail If you have any lab test that is abnormal or we need to change your treatment, we will call you to review the results.   Testing/Procedures: NONE    Follow-Up: At [facility], you and your health needs are our priority.  As part of our continuing mission to provide you with exceptional heart care, we have created designated Provider Care Teams.  These Care Teams include your primary Cardiologist (physician) and Advanced Practice Providers (APPs -  Physician Assistants and Nurse Practitioners) who all work together to provide you with the care you need, when you need it.  We recommend signing up for the patient portal called MyChart.  Sign up information is provided on this After Visit Summary.  MyChart is used to connect with patients for Virtual Visits (Telemedicine).  Patients are able to view lab/test results, encounter notes, upcoming appointments, etc.  Non-urgent messages can be sent to your provider as well.   To learn more about what you can do with MyChart, go to [email or web address].    Your next appointment:   3 month(s)  Provider:   You may see Maude Emmer, MD or one of the following Advanced Practice Providers on your designated Care Team:   Laymon Qua, PA-C  Olivia Pavy, PA-C     Other Instructions Thank you for choosing [facility]!

## 2023-01-15 ENCOUNTER — Other Ambulatory Visit (HOSPITAL_COMMUNITY)
Admission: RE | Admit: 2023-01-15 | Discharge: 2023-01-15 | Disposition: A | Discharge status: Home / Self Care | Payer: PPO | Source: Ambulatory Visit | Attending: Cardiovascular Disease | Admitting: Cardiovascular Disease

## 2023-01-15 DIAGNOSIS — E1122 Type 2 diabetes mellitus with diabetic chronic kidney disease: Secondary | ICD-10-CM | POA: Insufficient documentation

## 2023-01-15 DIAGNOSIS — N183 Chronic kidney disease, stage 3 unspecified: Secondary | ICD-10-CM | POA: Insufficient documentation

## 2023-01-15 DIAGNOSIS — I5022 Chronic systolic (congestive) heart failure: Secondary | ICD-10-CM | POA: Insufficient documentation

## 2023-01-15 LAB — BRAIN NATRIURETIC PEPTIDE: B Natriuretic Peptide: 134 pg/mL — ABNORMAL HIGH (ref 0.0–100.0)

## 2023-01-15 LAB — BASIC METABOLIC PANEL
Anion gap: 8 (ref 5–15)
BUN: 19 mg/dL (ref 8–23)
CO2: 28 mmol/L (ref 22–32)
Calcium: 9.7 mg/dL (ref 8.9–10.3)
Chloride: 105 mmol/L (ref 98–111)
Creatinine, Ser: 1.27 mg/dL — ABNORMAL HIGH (ref 0.61–1.24)
GFR, Estimated: 59 mL/min — ABNORMAL LOW (ref >60)
Glucose, Bld: 69 mg/dL — ABNORMAL LOW (ref 70–99)
Potassium: 4.2 mmol/L (ref 3.5–5.1)
Sodium: 141 mmol/L (ref 135–145)

## 2023-01-21 DIAGNOSIS — E1122 Type 2 diabetes mellitus with diabetic chronic kidney disease: Secondary | ICD-10-CM | POA: Diagnosis not present

## 2023-01-21 DIAGNOSIS — E210 Primary hyperparathyroidism: Secondary | ICD-10-CM | POA: Diagnosis not present

## 2023-01-21 DIAGNOSIS — N200 Calculus of kidney: Secondary | ICD-10-CM | POA: Diagnosis not present

## 2023-01-21 DIAGNOSIS — R809 Proteinuria, unspecified: Secondary | ICD-10-CM | POA: Diagnosis not present

## 2023-01-21 DIAGNOSIS — E1129 Type 2 diabetes mellitus with other diabetic kidney complication: Secondary | ICD-10-CM | POA: Diagnosis not present

## 2023-01-21 DIAGNOSIS — N189 Chronic kidney disease, unspecified: Secondary | ICD-10-CM | POA: Diagnosis not present

## 2023-01-28 DIAGNOSIS — E210 Primary hyperparathyroidism: Secondary | ICD-10-CM | POA: Diagnosis not present

## 2023-01-28 DIAGNOSIS — R809 Proteinuria, unspecified: Secondary | ICD-10-CM | POA: Diagnosis not present

## 2023-01-28 DIAGNOSIS — N200 Calculus of kidney: Secondary | ICD-10-CM | POA: Diagnosis not present

## 2023-02-05 ENCOUNTER — Encounter: Payer: Self-pay | Admitting: Internal Medicine

## 2023-02-19 DIAGNOSIS — E1165 Type 2 diabetes mellitus with hyperglycemia: Secondary | ICD-10-CM | POA: Diagnosis not present

## 2023-02-19 DIAGNOSIS — I480 Paroxysmal atrial fibrillation: Secondary | ICD-10-CM | POA: Diagnosis not present

## 2023-02-19 DIAGNOSIS — M549 Dorsalgia, unspecified: Secondary | ICD-10-CM | POA: Diagnosis not present

## 2023-02-19 DIAGNOSIS — E1140 Type 2 diabetes mellitus with diabetic neuropathy, unspecified: Secondary | ICD-10-CM | POA: Diagnosis not present

## 2023-02-19 DIAGNOSIS — I5033 Acute on chronic diastolic (congestive) heart failure: Secondary | ICD-10-CM | POA: Diagnosis not present

## 2023-02-19 DIAGNOSIS — E1129 Type 2 diabetes mellitus with other diabetic kidney complication: Secondary | ICD-10-CM | POA: Diagnosis not present

## 2023-02-19 DIAGNOSIS — M150 Primary generalized (osteo)arthritis: Secondary | ICD-10-CM | POA: Diagnosis not present

## 2023-02-19 DIAGNOSIS — E6609 Other obesity due to excess calories: Secondary | ICD-10-CM | POA: Diagnosis not present

## 2023-02-19 DIAGNOSIS — N183 Chronic kidney disease, stage 3 unspecified: Secondary | ICD-10-CM | POA: Diagnosis not present

## 2023-02-19 DIAGNOSIS — Z6834 Body mass index (BMI) 34.0-34.9, adult: Secondary | ICD-10-CM | POA: Diagnosis not present

## 2023-02-23 ENCOUNTER — Inpatient Hospital Stay (HOSPITAL_COMMUNITY): Admission: RE | Admit: 2023-02-23 | Payer: PPO | Source: Ambulatory Visit

## 2023-03-04 ENCOUNTER — Encounter (HOSPITAL_COMMUNITY): Payer: Self-pay

## 2023-03-04 ENCOUNTER — Emergency Department (HOSPITAL_COMMUNITY): Payer: PPO

## 2023-03-04 ENCOUNTER — Observation Stay (HOSPITAL_COMMUNITY)
Admission: EM | Admit: 2023-03-04 | Discharge: 2023-03-05 | Disposition: A | Discharge status: Home / Self Care | Payer: PPO | Attending: Family Medicine | Admitting: Family Medicine

## 2023-03-04 ENCOUNTER — Other Ambulatory Visit: Payer: Self-pay

## 2023-03-04 DIAGNOSIS — N1831 Chronic kidney disease, stage 3a: Secondary | ICD-10-CM | POA: Insufficient documentation

## 2023-03-04 DIAGNOSIS — I447 Left bundle-branch block, unspecified: Secondary | ICD-10-CM | POA: Diagnosis not present

## 2023-03-04 DIAGNOSIS — N400 Enlarged prostate without lower urinary tract symptoms: Secondary | ICD-10-CM | POA: Insufficient documentation

## 2023-03-04 DIAGNOSIS — Z1152 Encounter for screening for COVID-19: Secondary | ICD-10-CM | POA: Insufficient documentation

## 2023-03-04 DIAGNOSIS — Z952 Presence of prosthetic heart valve: Secondary | ICD-10-CM | POA: Diagnosis not present

## 2023-03-04 DIAGNOSIS — R0602 Shortness of breath: Secondary | ICD-10-CM | POA: Diagnosis not present

## 2023-03-04 DIAGNOSIS — N179 Acute kidney failure, unspecified: Secondary | ICD-10-CM | POA: Diagnosis not present

## 2023-03-04 DIAGNOSIS — E785 Hyperlipidemia, unspecified: Secondary | ICD-10-CM | POA: Insufficient documentation

## 2023-03-04 DIAGNOSIS — E669 Obesity, unspecified: Secondary | ICD-10-CM | POA: Insufficient documentation

## 2023-03-04 DIAGNOSIS — Z87891 Personal history of nicotine dependence: Secondary | ICD-10-CM | POA: Insufficient documentation

## 2023-03-04 DIAGNOSIS — E119 Type 2 diabetes mellitus without complications: Secondary | ICD-10-CM

## 2023-03-04 DIAGNOSIS — E1122 Type 2 diabetes mellitus with diabetic chronic kidney disease: Secondary | ICD-10-CM | POA: Diagnosis not present

## 2023-03-04 DIAGNOSIS — I5033 Acute on chronic diastolic (congestive) heart failure: Principal | ICD-10-CM | POA: Insufficient documentation

## 2023-03-04 DIAGNOSIS — I5032 Chronic diastolic (congestive) heart failure: Secondary | ICD-10-CM | POA: Diagnosis present

## 2023-03-04 DIAGNOSIS — Z7901 Long term (current) use of anticoagulants: Secondary | ICD-10-CM | POA: Diagnosis not present

## 2023-03-04 DIAGNOSIS — I4891 Unspecified atrial fibrillation: Secondary | ICD-10-CM | POA: Diagnosis not present

## 2023-03-04 DIAGNOSIS — Z79899 Other long term (current) drug therapy: Secondary | ICD-10-CM | POA: Diagnosis not present

## 2023-03-04 DIAGNOSIS — Z6833 Body mass index (BMI) 33.0-33.9, adult: Secondary | ICD-10-CM | POA: Insufficient documentation

## 2023-03-04 DIAGNOSIS — Z794 Long term (current) use of insulin: Secondary | ICD-10-CM | POA: Insufficient documentation

## 2023-03-04 DIAGNOSIS — R062 Wheezing: Secondary | ICD-10-CM | POA: Diagnosis not present

## 2023-03-04 DIAGNOSIS — I509 Heart failure, unspecified: Secondary | ICD-10-CM | POA: Diagnosis not present

## 2023-03-04 DIAGNOSIS — I25110 Atherosclerotic heart disease of native coronary artery with unstable angina pectoris: Secondary | ICD-10-CM | POA: Insufficient documentation

## 2023-03-04 DIAGNOSIS — R000 Tachycardia, unspecified: Secondary | ICD-10-CM | POA: Diagnosis not present

## 2023-03-04 DIAGNOSIS — I110 Hypertensive heart disease with heart failure: Secondary | ICD-10-CM | POA: Diagnosis not present

## 2023-03-04 DIAGNOSIS — I130 Hypertensive heart and chronic kidney disease with heart failure and stage 1 through stage 4 chronic kidney disease, or unspecified chronic kidney disease: Secondary | ICD-10-CM | POA: Diagnosis not present

## 2023-03-04 DIAGNOSIS — I10 Essential (primary) hypertension: Secondary | ICD-10-CM | POA: Diagnosis present

## 2023-03-04 DIAGNOSIS — Z9861 Coronary angioplasty status: Secondary | ICD-10-CM | POA: Insufficient documentation

## 2023-03-04 DIAGNOSIS — N183 Chronic kidney disease, stage 3 unspecified: Secondary | ICD-10-CM | POA: Diagnosis present

## 2023-03-04 DIAGNOSIS — I2510 Atherosclerotic heart disease of native coronary artery without angina pectoris: Secondary | ICD-10-CM

## 2023-03-04 DIAGNOSIS — I959 Hypotension, unspecified: Secondary | ICD-10-CM | POA: Diagnosis not present

## 2023-03-04 DIAGNOSIS — R0989 Other specified symptoms and signs involving the circulatory and respiratory systems: Secondary | ICD-10-CM | POA: Diagnosis not present

## 2023-03-04 DIAGNOSIS — I517 Cardiomegaly: Secondary | ICD-10-CM | POA: Diagnosis not present

## 2023-03-04 LAB — CBC WITH DIFFERENTIAL/PLATELET
Abs Immature Granulocytes: 0.06 10*3/uL (ref 0.00–0.07)
Basophils Absolute: 0 10*3/uL (ref 0.0–0.1)
Basophils Relative: 0 %
Eosinophils Absolute: 0.2 10*3/uL (ref 0.0–0.5)
Eosinophils Relative: 2 %
HCT: 38.4 % — ABNORMAL LOW (ref 39.0–52.0)
Hemoglobin: 12.2 g/dL — ABNORMAL LOW (ref 13.0–17.0)
Immature Granulocytes: 1 %
Lymphocytes Relative: 13 %
Lymphs Abs: 1.3 10*3/uL (ref 0.7–4.0)
MCH: 28.6 pg (ref 26.0–34.0)
MCHC: 31.8 g/dL (ref 30.0–36.0)
MCV: 89.9 fL (ref 80.0–100.0)
Monocytes Absolute: 0.8 10*3/uL (ref 0.1–1.0)
Monocytes Relative: 8 %
Neutro Abs: 7.3 10*3/uL (ref 1.7–7.7)
Neutrophils Relative %: 76 %
Platelets: 184 10*3/uL (ref 150–400)
RBC: 4.27 MIL/uL (ref 4.22–5.81)
RDW: 15.3 % (ref 11.5–15.5)
WBC: 9.6 10*3/uL (ref 4.0–10.5)
nRBC: 0 % (ref 0.0–0.2)

## 2023-03-04 LAB — COMPREHENSIVE METABOLIC PANEL
ALT: 16 U/L (ref 0–44)
AST: 23 U/L (ref 15–41)
Albumin: 3.6 g/dL (ref 3.5–5.0)
Alkaline Phosphatase: 32 U/L — ABNORMAL LOW (ref 38–126)
Anion gap: 9 (ref 5–15)
BUN: 14 mg/dL (ref 8–23)
CO2: 23 mmol/L (ref 22–32)
Calcium: 9.7 mg/dL (ref 8.9–10.3)
Chloride: 107 mmol/L (ref 98–111)
Creatinine, Ser: 1.09 mg/dL (ref 0.61–1.24)
GFR, Estimated: >60 mL/min (ref >60)
Glucose, Bld: 95 mg/dL (ref 70–99)
Potassium: 3.7 mmol/L (ref 3.5–5.1)
Sodium: 139 mmol/L (ref 135–145)
Total Bilirubin: 0.9 mg/dL (ref 0.0–1.2)
Total Protein: 6.5 g/dL (ref 6.5–8.1)

## 2023-03-04 LAB — RESP PANEL BY RT-PCR (RSV, FLU A&B, COVID)  RVPGX2
Influenza A by PCR: NEGATIVE
Influenza B by PCR: NEGATIVE
Resp Syncytial Virus by PCR: NEGATIVE
SARS Coronavirus 2 by RT PCR: NEGATIVE

## 2023-03-04 LAB — BRAIN NATRIURETIC PEPTIDE: B Natriuretic Peptide: 144 pg/mL — ABNORMAL HIGH (ref 0.0–100.0)

## 2023-03-04 LAB — TROPONIN I (HIGH SENSITIVITY)
Troponin I (High Sensitivity): 48 ng/L — ABNORMAL HIGH (ref <18)
Troponin I (High Sensitivity): 62 ng/L — ABNORMAL HIGH (ref <18)

## 2023-03-04 LAB — GLUCOSE, CAPILLARY: Glucose-Capillary: 117 mg/dL — ABNORMAL HIGH (ref 70–99)

## 2023-03-04 MED ORDER — ACETAMINOPHEN 650 MG RE SUPP: 650.0000 mg | Freq: Four times a day (QID) | RECTAL | Status: DC | PRN

## 2023-03-04 MED ORDER — VITAMIN B-12 100 MCG PO TABS
100.0000 ug | ORAL_TABLET | Freq: Every day | ORAL | Status: DC
  Administered 2023-03-04 – 2023-03-05 (×2): 100 ug via ORAL
  Filled 2023-03-04 (×2): qty 1

## 2023-03-04 MED ORDER — APIXABAN 5 MG PO TABS
5.0000 mg | ORAL_TABLET | Freq: Two times a day (BID) | ORAL | Status: DC
  Administered 2023-03-04 – 2023-03-05 (×2): 5 mg via ORAL
  Filled 2023-03-04 (×2): qty 1

## 2023-03-04 MED ORDER — FINASTERIDE 5 MG PO TABS
5.0000 mg | ORAL_TABLET | Freq: Every evening | ORAL | Status: DC
  Administered 2023-03-04: 5 mg via ORAL
  Filled 2023-03-04: qty 1

## 2023-03-04 MED ORDER — ALBUTEROL SULFATE (2.5 MG/3ML) 0.083% IN NEBU: 2.5000 mg | INHALATION_SOLUTION | RESPIRATORY_TRACT | Status: DC | PRN

## 2023-03-04 MED ORDER — INSULIN DETEMIR 100 UNIT/ML FLEXPEN: 20.0000 [IU] | PEN_INJECTOR | Freq: Every day | SUBCUTANEOUS | Status: DC

## 2023-03-04 MED ORDER — INFLUENZA VAC A&B SURF ANT ADJ 0.5 ML IM SUSY
0.5000 mL | PREFILLED_SYRINGE | INTRAMUSCULAR | Status: DC
Start: 2023-03-05 — End: 2023-03-05

## 2023-03-04 MED ORDER — INSULIN ASPART 100 UNIT/ML IJ SOLN
0.0000 [IU] | Freq: Three times a day (TID) | INTRAMUSCULAR | Status: DC
  Administered 2023-03-05: 2 [IU] via SUBCUTANEOUS

## 2023-03-04 MED ORDER — INSULIN GLARGINE 100 UNIT/ML ~~LOC~~ SOLN
20.0000 [IU] | Freq: Every day | SUBCUTANEOUS | Status: DC
  Filled 2023-03-04: qty 0.2

## 2023-03-04 MED ORDER — ROSUVASTATIN CALCIUM 20 MG PO TABS
20.0000 mg | ORAL_TABLET | Freq: Every evening | ORAL | Status: DC
  Administered 2023-03-04: 20 mg via ORAL
  Filled 2023-03-04: qty 1

## 2023-03-04 MED ORDER — FUROSEMIDE 10 MG/ML IJ SOLN
40.0000 mg | Freq: Once | INTRAMUSCULAR | Status: AC
  Administered 2023-03-04: 40 mg via INTRAVENOUS
  Filled 2023-03-04: qty 4

## 2023-03-04 MED ORDER — DAPAGLIFLOZIN PROPANEDIOL 10 MG PO TABS
10.0000 mg | ORAL_TABLET | Freq: Every morning | ORAL | Status: DC
  Administered 2023-03-05: 10 mg via ORAL
  Filled 2023-03-04: qty 1

## 2023-03-04 MED ORDER — INSULIN ASPART 100 UNIT/ML IJ SOLN: 0.0000 [IU] | Freq: Every day | INTRAMUSCULAR | Status: DC

## 2023-03-04 MED ORDER — HYDROCODONE-ACETAMINOPHEN 10-325 MG PO TABS
1.0000 | ORAL_TABLET | Freq: Four times a day (QID) | ORAL | Status: DC | PRN
  Administered 2023-03-04 – 2023-03-05 (×2): 1 via ORAL
  Filled 2023-03-04 (×2): qty 1

## 2023-03-04 MED ORDER — TIZANIDINE HCL 4 MG PO TABS: 4.0000 mg | ORAL_TABLET | Freq: Four times a day (QID) | ORAL | Status: DC | PRN

## 2023-03-04 MED ORDER — PANTOPRAZOLE SODIUM 40 MG PO TBEC
40.0000 mg | DELAYED_RELEASE_TABLET | Freq: Every evening | ORAL | Status: DC
Start: 2023-03-04 — End: 2023-03-05
  Administered 2023-03-04: 40 mg via ORAL
  Filled 2023-03-04: qty 1

## 2023-03-04 MED ORDER — INSULIN GLARGINE-YFGN 100 UNIT/ML ~~LOC~~ SOLN
20.0000 [IU] | Freq: Every day | SUBCUTANEOUS | Status: DC
  Administered 2023-03-04: 20 [IU] via SUBCUTANEOUS
  Filled 2023-03-04 (×2): qty 0.2

## 2023-03-04 MED ORDER — COQ10 30 MG PO CAPS: ORAL_CAPSULE | Freq: Every evening | ORAL | Status: DC

## 2023-03-04 MED ORDER — ACETAMINOPHEN 325 MG PO TABS
650.0000 mg | ORAL_TABLET | Freq: Four times a day (QID) | ORAL | Status: DC | PRN
  Filled 2023-03-04 (×2): qty 2

## 2023-03-04 MED ORDER — METOPROLOL TARTRATE 25 MG PO TABS
37.5000 mg | ORAL_TABLET | Freq: Two times a day (BID) | ORAL | Status: DC
  Administered 2023-03-04 – 2023-03-05 (×2): 37.5 mg via ORAL
  Filled 2023-03-04 (×2): qty 2

## 2023-03-04 MED ORDER — FUROSEMIDE 10 MG/ML IJ SOLN
40.0000 mg | Freq: Every day | INTRAMUSCULAR | Status: DC
  Administered 2023-03-05: 40 mg via INTRAVENOUS
  Filled 2023-03-04: qty 4

## 2023-03-04 NOTE — ED Notes (Signed)
 Room air oxygen saturations above 95%

## 2023-03-04 NOTE — ED Provider Notes (Signed)
 Signout from Dr. Posey Rea.  78 year old male brought in by EMS for shortness of breath.  Recent admitted in December for congestive heart failure.  EF 50 to 55%.  Requiring oxygen here.  He is pending results of his chest x-ray and BNP.  Diuretics have been ordered.  Will need admission Physical Exam  BP (!) 112/98 (BP Location: Right Arm)   Pulse 70   Temp 97.7 F (36.5 C) (Oral)   Resp (!) 22   Ht 5\' 9"  (1.753 m)   Wt 104.3 kg   SpO2 98%   BMI 33.97 kg/m   Physical Exam  Procedures  Procedures  ED Course / MDM    Medical Decision Making Amount and/or Complexity of Data Reviewed Labs: ordered. Radiology: ordered.  Risk Prescription drug management. Decision regarding hospitalization.   Labs showing mildly low hemoglobin, chemistries and LFTs unremarkable, COVID and flu negative, troponins mildly elevated BNP elevated, chest x-ray showing some mild cardiomegaly with interstitial edema.  It sounds like he has been compliant with his Lasix.  Will admit for IV diuresis.  Discussed with [facility] hospitalist Dr. Randol Kern who will evaluate for admission       Terrilee Files, MD 03/05/23 [phone]

## 2023-03-04 NOTE — Progress Notes (Signed)
 PHARMACIST - PHYSICIAN ORDER COMMUNICATION  CONCERNING: P&T Medication Policy on Herbal Medications  DESCRIPTION:  This patient's order for:  CoQ10 Capsules  has been noted.  This product(s) is classified as an "herbal" or natural product. Due to a lack of definitive safety studies or FDA approval, nonstandard manufacturing practices, plus the potential risk of unknown drug-drug interactions while on inpatient medications, the Pharmacy and Therapeutics Committee does not permit the use of "herbal" or natural products of this type within [facility].   ACTION TAKEN: The pharmacy department is unable to verify this order at this time. Please reevaluate patient's clinical condition at discharge and address if the herbal or natural product(s) should be resumed at that time.  Gardner Candle, PharmD, BCPS Clinical Pharmacist 03/04/2023 6:53 PM

## 2023-03-04 NOTE — ED Triage Notes (Signed)
 BIB EMS for SOB. Pt generally not on supplemental O2 but has needed it today per EMS. Pt states he was walking at home yesterday outside and he became shortwinded. Said it lasted about 20 min. Then went away. Came back today and was more intense so came to get checked out. Hx of HTN. Diabetic, takes Eliquis daily.

## 2023-03-04 NOTE — H&P (Signed)
 [facility] H&P   Patient Demographics:    William [name], is a 78 y.o. male  MRN: [id number]   DOB - 1945/07/04  Admit Date - 03/04/2023  Outpatient Primary MD for the patient is Elfredia Nevins, MD  Referring MD/NP/PA: Dr Charm Barges  Patient coming from: home  Chief Complaint  Patient presents with   Shortness of Breath      HPI:    William [name]  is a 78 y.o. male, with a history of paroxysmal atrial fibrillation, HFpEF, aortic stenosis status post TAVR 09/2021, coronary disease status post DES to RCA, hypertension, diabetes mellitus type 2, hyperlipidemia, CKD stage III, left bundle branch block, BPH presenting with shortness of breath, patient with recent hospitalization in early January for acute on chronic diastolic CHF, improved after diuresis, patient presents to ED secondary to complaint of shortness of breath, progressive over the last couple days, reports mainly with exertion, but recently with orthopnea, patient reports he is compliant with his Lasix, salt restriction, but reports he has been trying to drink water to stay hydrated and healthy, he is not aware of fluid restrictions he denies any chest pain, fever or chills.  Does report recent lower extremity edema. -In ED he was dyspneic, BNP was mildly elevated at 144, he has +2 edema, troponins mildly elevated at 48> 62, x-ray significant for mild cardiomegaly and central vascular congestion, patient reports symptoms improved after IV Lasix, [facility] consulted to admit.   Review of systems:    A full 10 point Review of Systems was done, except as stated above, all other Review of Systems were negative.   With Past History of the following :    Past Medical History:  Diagnosis Date   Anxiety    Aortic stenosis    Arthritis    CAD (coronary artery disease)    a. s/p DES to distal RCA in 08/2013, DES to LAD 07/2021    Cancer [facility])    prostate   CKD (chronic kidney disease)    Diabetes mellitus without complication (HCC)    Family history of colon cancer    GERD (gastroesophageal reflux disease)    Heart murmur    Hypercalcemia    Hypercholesteremia    Hypertension    Kidney stone    PAF (paroxysmal atrial fibrillation) (HCC)    Personal history of colonic polyps    Pneumonia    S/P TAVR (transcatheter aortic valve replacement) 09/17/2021   s/p TAVR with a 23 mm Edwards S3UR via the TF approach by Dr. Gevena Cotton      Past Surgical History:  Procedure Laterality Date   APPENDECTOMY     BIOPSY  11/03/2019   Benign gastric mucosa with reactive changes and focal inflammation   BIOPSY  03/11/2021   Procedure: BIOPSY;  Surgeon: Lanelle Bal, DO;  [facility];  Service: Endoscopy;;   COLONOSCOPY  WITH PROPOFOL N/A 02/15/2018   12 polyps ranging in 5 to 20 mm in size were tubular adenoma and recommended repeat exam in 2023   COLONOSCOPY WITH PROPOFOL N/A 03/11/2021   Procedure: COLONOSCOPY WITH PROPOFOL;  Surgeon: Lanelle Bal, DO;  Location: [facility];  Service: Endoscopy;  Laterality: N/A;  9:15am   CORONARY STENT INTERVENTION N/A 07/15/2021   Procedure: CORONARY STENT INTERVENTION;  Surgeon: Kathleene Hazel, MD;  Location: [facility] INVAS[facility] CV LAB;  Service: Cardiovascular;  Laterality: N/A;   CORONARY STENT PLACEMENT  08/10/2013   ESOPHAGOGASTRODUODENOSCOPY (EGD) WITH PROPOFOL N/A 11/03/2019   normal esophagus, small hiatal hernia, diffuse erythematous mucosa in the entire stomach with scattered erosions.  Status post gastric biopsies for histology.  Surgical pathology found the biopsies to be benign gastric mucosa with reactive changes and focal inflammation, negative for H. Pylori.   FLEXIBLE SIGMOIDOSCOPY N/A 11/03/2019   attempted colonoscopy but inadequate prep   gsw to abd     INTRAOPERATIVE TRANSTHORACIC ECHOCARDIOGRAM N/A 09/17/2021   Procedure: INTRAOPERATIVE  TRANSTHORACIC ECHOCARDIOGRAM;  Surgeon: Kathleene Hazel, MD;  [facility];  Service: Open Heart Surgery;  Laterality: N/A;   LEFT HEART CATHETERIZATION WITH CORONARY ANGIOGRAM N/A 08/10/2013   Procedure: LEFT HEART CATHETERIZATION WITH CORONARY ANGIOGRAM;  Surgeon: Iran Ouch, MD;  Location: [facility] CATH LAB;  Service: Cardiovascular;  Laterality: N/A;   MULTIPLE EXTRACTIONS WITH ALVEOLOPLASTY N/A 08/22/2021   Procedure: MULTIPLE EXTRACTION WITH ALVEOLOPLASTY;  Surgeon: Sharman Cheek, [name];  Location: [facility] OR;  Service: Dentistry;  Laterality: N/A;   PERIPHERAL INTRAVASCULAR LITHOTRIPSY  07/15/2021   Procedure: INTRAVASCULAR LITHOTRIPSY;  Surgeon: Kathleene Hazel, MD;  [facility];  Service: Cardiovascular;;   POLYPECTOMY  02/15/2018   Procedure: POLYPECTOMY;  Surgeon: Corbin Ade, MD;  [facility];  Service: Endoscopy;;  colon   POLYPECTOMY  03/11/2021   Procedure: POLYPECTOMY;  Surgeon: Lanelle Bal, DO;  [facility];  Service: Endoscopy;;   RIGHT/LEFT HEART CATH AND CORONARY ANGIOGRAPHY N/A 07/15/2021   Procedure: RIGHT/LEFT HEART CATH AND CORONARY ANGIOGRAPHY;  Surgeon: Kathleene Hazel, MD;  [facility];  Service: Cardiovascular;  Laterality: N/A;   TRANSCATHETER AORTIC VALVE REPLACEMENT, TRANSFEMORAL N/A 09/17/2021   Procedure: Transcatheter Aortic Valve Replacement, Transfemoral;  Surgeon: Kathleene Hazel, MD;  [facility];  Service: Open Heart Surgery;  Laterality: N/A;      Social History:     Social History   Tobacco Use   Smoking status: Former    Current packs/day: 0.00    Average packs/day: 3.0 packs/day for 45.0 years (135.0 ttl pk-yrs)    Types: Cigarettes    Start date: 01/06/1962    Quit date: 10/24/2004    Years since quitting: 18.3   Smokeless tobacco: Former    Quit date: 08/10/2005  Substance Use Topics   Alcohol use: Not Currently     Comment: occasional    Family History :     Family History  Problem Relation Age of Onset   Diabetes Mother    Heart attack Mother 36   Pulmonary embolism Father    Colon cancer Brother 65       Passed age 24 from colon ca   Gastric cancer Neg Hx    Esophageal cancer Neg Hx       Home Medications:   Prior to Admission medications   Medication Sig Start Date End Date Taking? Authorizing  Provider  acarbose (PRECOSE) 100 MG tablet Take 100 mg by mouth 3 (three) times daily.    [provider]  Acetylcysteine (NAC PO) Take 1 tablet by mouth every evening.    [provider]  alendronate (FOSAMAX) 70 MG tablet Take 1 tablet (70 mg total) by mouth every 7 (seven) days. Take with a full glass of water on an empty stomach. Patient taking differently: Take 70 mg by mouth every Saturday. 04/15/21   Roma Kayser, MD  alfuzosin (UROXATRAL) 10 MG 24 hr tablet Take 10 mg by mouth every evening.    [provider]  amLODipine (NORVASC) 5 MG tablet TAKE 1 TABLET(5 MG) BY MOUTH DAILY Patient taking differently: Take 5 mg by mouth every evening. 11/30/17   Laqueta Linden, MD  Ascorbic Acid (VITAMIN C PO) Take 1 tablet by mouth every evening.    [provider]  Cholecalciferol (VITAMIN D-3 PO) Take 1 tablet by mouth every evening.    [provider]  Coenzyme Q10 (COQ10 PO) Take 1 capsule by mouth every evening.    [provider]  Cyanocobalamin (VITAMIN B-12 PO) Take 1 tablet by mouth every evening.    [provider]  ELIQUIS 5 MG TABS tablet TAKE 1 TABLET(5 MG) BY MOUTH TWICE DAILY 08/19/22   Wendall Stade, MD  FARXIGA 10 MG TABS tablet Take 10 mg by mouth in the morning. 12/01/17   [provider]  finasteride (PROSCAR) 5 MG tablet Take 1 tablet (5 mg total) by mouth daily. Patient taking differently: Take 5 mg by mouth every evening. 10/29/22   McKenzie, Mardene Celeste, MD  furosemide (LASIX) 40 MG tablet Take 1  tablet (40 mg total) by mouth daily. 01/08/23 01/08/24  Cleora Fleet, MD  HYDROcodone-acetaminophen (NORCO) 10-325 MG tablet Take 1 tablet by mouth every 6 (six) hours as needed for moderate pain.    [provider]  LEVEMIR FLEXTOUCH 100 UNIT/ML FlexTouch Pen Inject 20 Units into the skin at bedtime. 01/07/23   Johnson, Clanford L, MD  MAGNESIUM PO Take 1 tablet by mouth every evening.    [provider]  Menaquinone-7 (VITAMIN K2 PO) Take 1 tablet by mouth every evening.    [provider]  metFORMIN (GLUCOPHAGE) 1000 MG tablet Take 1,000 mg by mouth 2 (two) times daily. 09/14/14   [provider]  Metoprolol Tartrate 37.5 MG TABS Take 1 tablet (37.5 mg total) by mouth 2 (two) times daily. 08/20/22 08/15/23  Strader, Lennart Pall, PA-C  Multiple Vitamins-Minerals (ZINC PO) Take 1 tablet by mouth every evening.    [provider]  nitroGLYCERIN (NITROSTAT) 0.4 MG SL tablet Place 1 tablet (0.4 mg total) under the tongue every 5 (five) minutes x 3 doses as needed for chest pain (if no relief after 2nd dose, proceed to the ED for an evalution or call 911). 07/02/20   Netta Neat., NP  Nutritional Supplements (GLUCOSAMINE COMPLEX PO) Take 1 tablet by mouth every evening.    [provider]  pantoprazole (PROTONIX) 40 MG tablet Take 1 tablet (40 mg total) by mouth every evening. 02/18/22   Rourk, Gerrit Friends, MD  potassium chloride (KLOR-CON M) 10 MEQ tablet Take 1 tablet (10 mEq total) by mouth daily. 01/08/23   Johnson, Clanford L, MD  potassium citrate (UROCIT-K) 5 MEQ (540 MG) SR tablet Take 1 tablet (5 mEq total) by mouth 3 (three) times daily with meals. 04/28/22   McKenzie, Mardene Celeste, MD  rosuvastatin (CRESTOR) 20 MG tablet Take 1 tablet (20 mg total) by mouth daily. Patient taking differently: Take 20 mg by mouth every evening. 08/06/22 02/14/23  Maurilio Lovely D, DO  silodosin (RAPAFLO) 8 MG CAPS capsule Take 1 capsule (8 mg total) by mouth daily with  breakfast. 10/29/22   McKenzie, Mardene Celeste, MD  tiZANidine (ZANAFLEX) 4 MG tablet Take 4 mg by mouth every 6 (six) hours as needed for muscle spasms. 07/07/22   [provider]     Allergies:    No Known Allergies   Physical Exam:   Vitals  Blood pressure (!) 112/98, pulse 70, temperature 97.7 F (36.5 C), temperature source Oral, resp. rate (!) 22, height 5\' 9"  (1.753 m), weight 104.3 kg, SpO2 98%.   1. General well-developed male, in bed, no apparent distress  2. Normal affect and insight, Not Suicidal or Homicidal, Awake Alert, Oriented X 3.  3. No F.N deficits, ALL C.Nerves Intact, Strength 5/5 all 4 extremities, Sensation intact all 4 extremities, Plantars down going.  4. Ears and Eyes appear Normal, Conjunctivae clear, PERRLA. Moist Oral Mucosa.  5. Supple Neck, No JVD, No cervical lymphadenopathy appriciated, No Carotid Bruits.  6. Symmetrical Chest wall movement, Good air movement bilaterally,  crackles at the bases  7.  Irregular, no Gallops, Rubs or Murmurs, No Parasternal Heave.  +2 edema  8. Positive Bowel Sounds, Abdomen Soft, No tenderness, No organomegaly appriciated,No rebound -guarding or rigidity.  9.  No Cyanosis, Normal Skin Turgor, No Skin Rash or Bruise.  10. Good muscle tone,  joints appear normal , no effusions, Normal ROM.    Data Review:    CBC Recent Labs  Lab 03/04/23 1415  WBC 9.6  HGB 12.2*  HCT 38.4*  PLT 184  MCV 89.9  MCH 28.6  MCHC 31.8  RDW 15.3  LYMPHSABS 1.3  MONOABS 0.8  EOSABS 0.2  BASOSABS 0.0   ------------------------------------------------------------------------------------------------------------------  Chemistries  Recent Labs  Lab 03/04/23 1415  NA 139  K 3.7  CL 107  CO2 23  GLUCOSE 95  BUN 14  CREATININE 1.09  CALCIUM 9.7  AST 23  ALT 16  ALKPHOS 32*  BILITOT 0.9    ------------------------------------------------------------------------------------------------------------------ estimated creatinine clearance is 68.6 mL/min (by C-G formula based on SCr of 1.09 mg/dL). ------------------------------------------------------------------------------------------------------------------ No results for input(s): "TSH", "T4TOTAL", "T3FREE", "THYROIDAB" in the last 72 hours.  Invalid input(s): "FREET3"  Coagulation profile No results for input(s): "INR", "PROTIME" in the last 168 hours. ------------------------------------------------------------------------------------------------------------------- No results for input(s): "DDIMER" in the last 72 hours. -------------------------------------------------------------------------------------------------------------------  Cardiac Enzymes No results for input(s): "CKMB", "TROPONINI", "MYOGLOBIN" in the last 168 hours.  Invalid input(s): "CK" ------------------------------------------------------------------------------------------------------------------    Component Value Date/Time   BNP 144.0 (H) 03/04/2023 1415     ---------------------------------------------------------------------------------------------------------------  Urinalysis    Component Value Date/Time   COLORURINE STRAW (A) 03/17/2022 1516   APPEARANCEUR Clear 10/29/2022 1009   LABSPEC 1.010 03/17/2022 1516   PHURINE 5.0 03/17/2022 1516   GLUCOSEU 3+ (A) 10/29/2022 1009   HGBUR NEGATIVE 03/17/2022 1516   BILIRUBINUR Negative 10/29/2022 1009   KETONESUR NEGATIVE 03/17/2022 1516   PROTEINUR Negative 10/29/2022 1009   PROTEINUR NEGATIVE 03/17/2022 1516   UROBILINOGEN 0.2 10/21/2014 2350   NITRITE Negative 10/29/2022 1009   NITRITE NEGATIVE 03/17/2022 1516   LEUKOCYTESUR Negative 10/29/2022 1009   LEUKOCYTESUR NEGATIVE 03/17/2022 1516     ----------------------------------------------------------------------------------------------------------------   Imaging Results:    DG Chest Portable 1 View Result Date: 03/04/2023 CLINICAL DATA:  Shortness  of breath. EXAM: PORTABLE CHEST 1 VIEW COMPARISON:  Chest radiograph dated 01/05/2023. FINDINGS: There is mild cardiomegaly with mild central vascular congestion. No focal consolidation, pleural effusion, pneumothorax. No acute osseous pathology. IMPRESSION: Mild cardiomegaly with mild central vascular congestion. Electronically Signed   By: Elgie Collard M.D.   On: 03/04/2023 15:44    EKG: Vent. rate 68 BPM PR interval 166 ms QRS duration 157 ms QT/QTcB 425/452 ms P-R-T axes 29 17 117 Sinus rhythm Atrial premature complexes Left bundle branch block Confirmed by Kommor, Madiso  Assessment & Plan:    Principal Problem:   Acute on chronic diastolic CHF (congestive heart failure) (HCC) Active Problems:   Hyperlipidemia   DM (diabetes mellitus) (HCC)   HTN (hypertension)   Chronic diastolic heart failure (HCC)   CKD stage 3 due to type 2 diabetes mellitus (HCC)   S/P TAVR (transcatheter aortic valve replacement)   Atrial fibrillation with RVR (HCC)   CAD S/P percutaneous coronary angioplasty  Acute on chronic diastolic CHF -Presents with evidence of volume overload, cardiomegaly with vascular congestion on imaging, elevated BNP, and new lower extremity edema. -Appears to be due to not being aware of fluid restrictions, he was counseled, he was encouraged to keep following his salt restriction, medication compliance and daily weight(which she has been doing). -Continue with IV Lasix 40 mg daily -Continue with daily weights -Continue with strict ins and out -remains clinically fluid overloaded -12/30 Echo EF 50-55%, no WMA, normal RVF   Parox A-fib -Appears to be in A-fib, continue with Eliquis for anticoagulation, continue with metoprolol for heart rate control    Coronary artery disease -s/p DES to distal RCA in 08/2013, PCI/DES to LAD in 07/2021  -he denies any chest pain -Not antiplatelet therapy now he is on Eliquis . -Continue with statin and beta-blockers . -Troponins mildly elevated, but non-ACS pattern, this is more likely in the setting of acute CHF .  CKD stage IIIa -Function at baseline, monitor closely as on IV diuresis   Type 2 diabetes mellitus -Continue with home dose Semglee, will add insulin sliding scale -Hold Farxiga and metformin, acarbose during hospital stay  Hypertension  -Blood pressure is soft so well hold Norvasc, continue on metoprolol, low threshold to discontinue Norvasc on discharge if blood pressure remains soft and he is with recurrent lower extremity edema  Hyperlipidemia -continue with statin   BPH -continue with finasteride   Left bundle branch block -at base line, [place] any chest pain   Obesity -Body mass index is 33.97 kg/m.   DVT Prophylaxis on Eliquis  AM Labs Ordered, also please review Full Orders  Family Communication: Admission, patients condition and plan of care including tests being ordered have been discussed with the patient and son at bedside* who indicate understanding and agree with the plan and Code Status.  Code Status full code  Likely DC to home  Consults called: None  Admission status: Observation  Time spent in minutes : 70 minutes   Huey Bienenstock M.D on 03/04/2023 at 4:28 PM   [facility] - Office  [phone]

## 2023-03-05 DIAGNOSIS — I5033 Acute on chronic diastolic (congestive) heart failure: Secondary | ICD-10-CM | POA: Diagnosis not present

## 2023-03-05 LAB — BASIC METABOLIC PANEL
Anion gap: 7 (ref 5–15)
BUN: 17 mg/dL (ref 8–23)
CO2: 26 mmol/L (ref 22–32)
Calcium: 10.1 mg/dL (ref 8.9–10.3)
Chloride: 109 mmol/L (ref 98–111)
Creatinine, Ser: 1.32 mg/dL — ABNORMAL HIGH (ref 0.61–1.24)
GFR, Estimated: 56 mL/min — ABNORMAL LOW (ref >60)
Glucose, Bld: 199 mg/dL — ABNORMAL HIGH (ref 70–99)
Potassium: 4.1 mmol/L (ref 3.5–5.1)
Sodium: 142 mmol/L (ref 135–145)

## 2023-03-05 LAB — GLUCOSE, CAPILLARY
Glucose-Capillary: 168 mg/dL — ABNORMAL HIGH (ref 70–99)
Glucose-Capillary: 162 mg/dL — ABNORMAL HIGH (ref 70–99)
Glucose-Capillary: 195 mg/dL — ABNORMAL HIGH (ref 70–99)

## 2023-03-05 LAB — CBC
HCT: 39.9 % (ref 39.0–52.0)
Hemoglobin: 12.3 g/dL — ABNORMAL LOW (ref 13.0–17.0)
MCH: 28.1 pg (ref 26.0–34.0)
MCHC: 30.8 g/dL (ref 30.0–36.0)
MCV: 91.1 fL (ref 80.0–100.0)
Platelets: 191 10*3/uL (ref 150–400)
RBC: 4.38 MIL/uL (ref 4.22–5.81)
RDW: 15.1 % (ref 11.5–15.5)
WBC: 8.1 10*3/uL (ref 4.0–10.5)
nRBC: 0 % (ref 0.0–0.2)

## 2023-03-05 LAB — HEMOGLOBIN A1C
Hgb A1c MFr Bld: 7 % — ABNORMAL HIGH (ref 4.8–5.6)
Mean Plasma Glucose: 154.2 mg/dL

## 2023-03-05 MED ORDER — LISINOPRIL 10 MG PO TABS: 20.0000 mg | ORAL_TABLET | Freq: Every day | ORAL | Status: DC

## 2023-03-05 MED ORDER — AMLODIPINE BESYLATE 5 MG PO TABS: 5.0000 mg | ORAL_TABLET | Freq: Every evening | ORAL | Status: DC

## 2023-03-05 MED ORDER — METFORMIN HCL ER 500 MG PO TB24: 500.0000 mg | ORAL_TABLET | Freq: Every day | ORAL | Status: DC

## 2023-03-05 NOTE — TOC Initial Note (Signed)
 Transition of Care [facility]) - Initial/Assessment Note   Patient Details  Name: William [name] MRN: [id number] Date of Birth: December 08, 1945  [facility] CM/SW Contact:    Villa Herb, LCSWA Phone Number: 03/05/2023, 10:07 AM  Clinical Narrative:                 [facility] consulted for CHF screen. CSW spoke with pt to complete assessment. Pt lives alone. Pt is independent in completing ADLs. Pt drives to appointments. Pt has not had HH. Pt has a walking stick to use when going outside. Pt states he weighs every other day. Pt somewhat follows a heart healthy diet. Pt takes medications as prescribed. TOC to follow.   Expected Discharge Plan: Home/Self Care Barriers to Discharge: Continued Medical Work up   Patient Goals and CMS Choice Patient states their goals for this hospitalization and ongoing recovery are:: return home CMS Medicare.gov Compare Post Acute Care list provided to:: Patient Choice offered to / list presented to : Patient      Expected Discharge Plan and Services In-house Referral: Clinical Social Work Discharge Planning Services: CM Consult   Living arrangements for the past 2 months: Single Family Home                                      Prior Living Arrangements/Services Living arrangements for the past 2 months: Single Family Home Lives with:: Self Patient language and need for interpreter reviewed:: Yes Do you feel safe going back to the place where you live?: Yes      Need for Family Participation in Patient Care: Yes (Comment) Care giver support system in place?: Yes (comment) Current home services: DME Criminal Activity/Legal Involvement Pertinent to Current Situation/Hospitalization: No - Comment as needed  Activities of Daily Living   ADL Screening (condition at time of admission) Independently performs ADLs?: Yes (appropriate for developmental age) Is the patient deaf or have difficulty hearing?: No Does the patient have difficulty  seeing, even when wearing glasses/contacts?: No Does the patient have difficulty concentrating, remembering, or making decisions?: No  Permission Sought/Granted                  Emotional Assessment Appearance:: Appears stated age Attitude/Demeanor/Rapport: Engaged Affect (typically observed): Accepting Orientation: : Oriented to Self, Oriented to Place, Oriented to  Time, Oriented to Situation Alcohol / Substance Use: Not Applicable Psych Involvement: No (comment)  Admission diagnosis:  Acute on chronic diastolic CHF (congestive heart failure) (HCC) [I50.33] Patient Active Problem List   Diagnosis Date Noted   Acute on chronic diastolic CHF (congestive heart failure) (HCC) 01/05/2023   Acute respiratory failure with hypoxia (HCC) 01/05/2023   Elevated troponin 08/04/2022   AKI (acute kidney injury) (HCC) 08/04/2022   Atrial fibrillation with RVR (HCC) 09/22/2021   CAD S/P percutaneous coronary angioplasty 09/22/2021   LBBB (left bundle branch block) 09/22/2021   S/P TAVR (transcatheter aortic valve replacement) 09/17/2021   NSTEMI (non-ST elevated myocardial infarction) (HCC) 09/02/2021   Loss of teeth due to extraction 08/30/2021   Periodontal disease    Teeth missing 08/14/2021   Caries 08/14/2021   Retained tooth root 08/14/2021   Loose, teeth 08/14/2021   Excessive dental attrition 08/14/2021   Phobia of dental procedure 08/14/2021   Long term (current) use of antithrombotics/antiplatelets 08/14/2021   Chronic apical periodontitis 08/14/2021   Chronic periodontitis 08/14/2021   Atrophy of  edentulous alveolar ridge 08/14/2021   Accretions on teeth 08/14/2021   Encounter for preoperative dental examination 08/13/2021   Low ferritin 08/13/2021   Severe aortic stenosis    Unstable angina (HCC)    Age-related osteoporosis without current pathological fracture 04/15/2021   Other hyperparathyroidism (HCC) 02/12/2021   Stopped smoking with greater than 40 pack year  history 02/12/2021   Hypercalcemia 01/24/2021   Iron deficiency anemia 05/26/2019   History of colonic polyps 08/18/2018   Family history of colon cancer 01/21/2018   History of coronary artery disease    Gastroesophageal reflux disease    Chronic diastolic heart failure (HCC)    CKD stage 3 due to type 2 diabetes mellitus (HCC)    Abdominal wall abscess at site of surgical wound 02/26/2015   Abscess of abdominal wall 02/26/2015   Coronary artery disease 08/24/2013   Chest pain 08/10/2013   Hyperlipidemia 08/10/2013   DM (diabetes mellitus) (HCC) 08/10/2013   HTN (hypertension) 08/10/2013   Solitary pulmonary nodule 08/10/2013   PCP:  Elfredia Nevins, MD Pharmacy:   [facility] - [place], [place] - [address] [place] [address] Phone: [phone] Fax: [phone]  [id number] - [place], [place] - [address] [place] [address] Phone: [phone] Fax: [phone]     Social Drivers of Health (SDOH) Social History: SDOH Screenings   Food Insecurity: No Food Insecurity (03/04/2023)  Housing: Low Risk  (03/04/2023)  Recent Concern: Housing - High Risk (01/05/2023)  Transportation Needs: No Transportation Needs (03/04/2023)  Utilities: Not At Risk (03/04/2023)  Depression (PHQ2-9): Low Risk  (10/24/2021)  Social Connections: Socially Isolated (03/04/2023)  Tobacco Use: Medium Risk (03/04/2023)   SDOH Interventions:     Readmission Risk Interventions    09/18/2021   10:49 AM  Readmission Risk Prevention Plan  Post Dischage Appt Complete  Medication Screening Complete  Transportation Screening Complete

## 2023-03-05 NOTE — Care Management Obs Status (Signed)
 MEDICARE OBSERVATION STATUS NOTIFICATION   Patient Details  Name: William [name] MRN: [id number] Date of Birth: 1945/01/23   Medicare Observation Status Notification Given:  Yes    Corey Harold 03/05/2023, 11:03 AM

## 2023-03-05 NOTE — Plan of Care (Signed)
   Problem: Education: Goal: Knowledge of General Education information will improve Description Including pain rating scale, medication(s)/side effects and non-pharmacologic comfort measures Outcome: Progressing   Problem: Health Behavior/Discharge Planning: Goal: Ability to manage health-related needs will improve Outcome: Progressing

## 2023-03-05 NOTE — Plan of Care (Signed)

## 2023-03-05 NOTE — ED Provider Notes (Signed)
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 TAVR (transcatheter aortic valve replacement) 09/17/2021   s/p TAVR with a 23 mm Edwards S3UR via the TF approach by Dr. Clifton [facility] [employer] [facility]      Dispostion: I considered admission for this patient, and disposition pending completion of laboratory evaluation and reevaluation by oncoming provider.  Please see provider signout note for continuation of workup.     Final Clinical Impression(s) / ED Diagnoses Final diagnoses:  Acute on chronic congestive heart failure, unspecified heart failure type [facility])     @PCDICTATION @    Glendora Score, MD 03/05/23 1209

## 2023-03-05 NOTE — Hospital Course (Addendum)
 William [name]  is a 78 y.o. male, with a history of paroxysmal atrial fibrillation, HFpEF, aortic stenosis status post TAVR 09/2021, coronary disease status post DES to RCA, hypertension, diabetes mellitus type 2, hyperlipidemia, CKD stage III, left bundle branch block, BPH presenting with shortness of breath, patient with recent hospitalization in early January for acute on chronic diastolic CHF, improved after diuresis, patient presents to ED secondary to complaint of shortness of breath, progressive over the last couple days, reports mainly with exertion, but recently with orthopnea, patient reports he is compliant with his Lasix, salt restriction, but reports he has been trying to drink water to stay hydrated and healthy, he is not aware of fluid restrictions he denies any chest pain, fever or chills.  Does report recent lower extremity edema. - ED:  Was dyspneic, BNP was mildly elevated at 144, he has +2 edema, troponins mildly elevated at 48> 62, x-ray significant for mild cardiomegaly and central vascular congestion, patient reports symptoms improved after IV Lasix, [facility] hospitalist consulted to admit.     Assessment & Plan:     Principal Problem:   Acute on chronic diastolic CHF (congestive heart failure) (HCC) Active Problems:   Hyperlipidemia   DM (diabetes mellitus) (HCC)   HTN (hypertension)   Chronic diastolic heart failure (HCC)   CKD stage 3 due to type 2 diabetes mellitus (HCC)   S/P TAVR (transcatheter aortic valve replacement)   Atrial fibrillation with RVR (HCC)   CAD S/P percutaneous coronary angioplasty   Acute on chronic diastolic CHF -Presents with evidence of volume overload, cardiomegaly with vascular congestion on imaging, elevated BNP, and new lower extremity edema. -Appears to be due to not being aware of fluid restrictions, he was counseled, he was encouraged to keep following his salt restriction, medication compliance and daily weight(which she has been  doing). -Continue with IV Lasix 40 mg daily -Continue with daily weights -Continue with strict ins and out -remains clinically fluid overloaded -12/30 Echo EF 50-55%, no WMA, normal RVF     Parox A-fib - continue with Eliquis and  metoprolol    Coronary artery disease -s/p DES to distal RCA in 08/2013, PCI/DES to LAD in 07/2021  -he denies any chest pain -on Eliquis . -Continue with statin and beta-blockers . -Troponins mildly elevated, but non-ACS pattern, this is more likely in the setting of acute CHF .   CKD stage IIIa -Function at baseline, monitor closely as on IV diuresis   Type 2 diabetes mellitus -Continue with home dose Semglee, will add insulin sliding scale -Hold Farxiga and metformin, acarbose during hospital stay   Hypertension  -Blood pressure is soft so well hold Norvasc, continue on metoprolol, low threshold to discontinue Norvasc on discharge if blood pressure remains soft and he is with recurrent lower extremity edema   Hyperlipidemia -continue with statin   BPH -continue with finasteride   Left bundle branch block -at base line, [place] any chest pain   Obesity -Body mass index is 33.97 kg/m.

## 2023-03-05 NOTE — Discharge Summary (Signed)
 Physician Discharge Summary   Patient: William [name] MRN: [id number] DOB: 11/14/1945  Admit date:     03/04/2023  Discharge date: 03/05/23  Discharge Physician: Kendell Bane   PCP: Elfredia Nevins, MD   Recommendations at discharge:    Follow-up with PCP in 1-2 weeks Follow-up with cardiologist in 2-4 weeks Continue monitoring daily weight-inform your cardiologist or PCP if gaining weight 3-5 pounds or symptomatic with shortness of breath and swelling (extra dose of 40 mg of Lasix may be taken)  Discharge Diagnoses: Principal Problem:   Acute on chronic diastolic CHF (congestive heart failure) (HCC) Active Problems:   Hyperlipidemia   DM (diabetes mellitus) (HCC)   HTN (hypertension)   Chronic diastolic heart failure (HCC)   CKD stage 3 due to type 2 diabetes mellitus (HCC)   S/P TAVR (transcatheter aortic valve replacement)   Atrial fibrillation with RVR (HCC)   CAD S/P percutaneous coronary angioplasty  Resolved Problems:   * No resolved hospital problems. *  Hospital Course: William [name]  is a 78 y.o. male, with a history of paroxysmal atrial fibrillation, HFpEF, aortic stenosis status post TAVR 09/2021, coronary disease status post DES to RCA, hypertension, diabetes mellitus type 2, hyperlipidemia, CKD stage III, left bundle branch block, BPH presenting with shortness of breath, patient with recent hospitalization in early January for acute on chronic diastolic CHF, improved after diuresis, patient presents to ED secondary to complaint of shortness of breath, progressive over the last couple days, reports mainly with exertion, but recently with orthopnea, patient reports he is compliant with his Lasix, salt restriction, but reports he has been trying to drink water to stay hydrated and healthy, he is not aware of fluid restrictions he denies any chest pain, fever or chills.  Does report recent lower extremity edema. - ED:  Was dyspneic, BNP was mildly elevated at 144, he has  +2 edema, troponins mildly elevated at 48> 62, x-ray significant for mild cardiomegaly and central vascular congestion, patient reports symptoms improved after IV Lasix, [facility] hospitalist consulted to admit.   Acute on chronic diastolic CHF -Presents with evidence of volume overload, cardiomegaly with vascular congestion on imaging, elevated BNP, and new lower extremity edema. -Appears to be due to not being aware of fluid restrictions, he was counseled, he was encouraged to keep following his salt restriction, medication compliance and daily weight(which she has been doing). -Continue with IV Lasix 40 mg daily>>> patient back to p.o. Lasix -12/30 Echo EF 50-55%, no WMA, normal RVF    Intake/Output Summary (Last 24 hours) at 03/05/2023 1115 Last data filed at 03/05/2023 0500 Gross per 24 hour  Intake 500 ml  Output --  Net 500 ml   Filed Weights   03/04/23 1350 03/04/23 1804 03/05/23 0500  Weight: 104.3 kg 105.3 kg 106 kg       Parox A-fib - continue with Eliquis and  metoprolol    Coronary artery disease -s/p DES to distal RCA in 08/2013, PCI/DES to LAD in 07/2021  -he denies any chest pain -on Eliquis . -Continue with statin and beta-blockers . -Troponins mildly elevated, but non-ACS pattern, this is more likely in the setting of acute CHF .   CKD stage IIIa -Function at baseline, monitor    Type 2 diabetes mellitus -Continue with home long-acting insulin -Resume Farxiga and metformin, acarbose during hospital stay   Hypertension  -Blood pressure was Norvasc was on hold , continue on metoprolol, Continue Norvasc  Hyperlipidemia -continue with statin  BPH -continue with finasteride   Left bundle branch block -at base line, [place] any chest pain   Obesity -Body mass index is 33.97 kg/m.   Disposition: Home Diet recommendation:  Discharge Diet Orders (From admission, onward)     Start     Ordered   03/05/23 0000  Diet - low sodium heart healthy         03/05/23 1109           Cardiac and Carb modified diet DISCHARGE MEDICATION: Allergies as of 03/05/2023   No Known Allergies      Medication List     STOP taking these medications    NAC PO   potassium chloride 10 MEQ tablet Commonly known as: KLOR-CON   potassium citrate 5 MEQ (540 MG) SR tablet Commonly known as: UROCIT-K   VITAMIN C PO       TAKE these medications    acarbose 100 MG tablet Commonly known as: PRECOSE Take 100 mg by mouth 3 (three) times daily.   alendronate 70 MG tablet Commonly known as: FOSAMAX Take 1 tablet (70 mg total) by mouth every 7 (seven) days. Take with a full glass of water on an empty stomach. What changed:  when to take this additional instructions   amLODipine 5 MG tablet Commonly known as: NORVASC TAKE 1 TABLET(5 MG) BY MOUTH DAILY What changed: See the new instructions.   COQ10 PO Take 1 capsule by mouth every evening.   Eliquis 5 MG Tabs tablet Generic drug: apixaban TAKE 1 TABLET(5 MG) BY MOUTH TWICE DAILY   Farxiga 10 MG Tabs tablet Generic drug: dapagliflozin propanediol Take 10 mg by mouth in the morning.   finasteride 5 MG tablet Commonly known as: PROSCAR Take 1 tablet (5 mg total) by mouth daily. What changed: when to take this   furosemide 40 MG tablet Commonly known as: Lasix Take 1 tablet (40 mg total) by mouth daily.   GLUCOSAMINE COMPLEX PO Take 1 tablet by mouth every evening.   HYDROcodone-acetaminophen 10-325 MG tablet Commonly known as: NORCO Take 1 tablet by mouth every 6 (six) hours as needed for moderate pain.   Lantus SoloStar 100 UNIT/ML Solostar Pen Generic drug: insulin glargine Inject 85 Units into the skin daily.   lisinopril 20 MG tablet Commonly known as: ZESTRIL Take 20 mg by mouth daily.   MAGNESIUM PO Take 1 tablet by mouth every evening.   metformin 500 MG (OSM) 24 hr tablet Commonly known as: FORTAMET Take 500 mg by mouth daily with breakfast. What changed:  Another medication with the same name was removed. Continue taking this medication, and follow the directions you see here.   Metoprolol Tartrate 37.5 MG Tabs Take 1 tablet (37.5 mg total) by mouth 2 (two) times daily.   nitroGLYCERIN 0.4 MG SL tablet Commonly known as: NITROSTAT Place 1 tablet (0.4 mg total) under the tongue every 5 (five) minutes x 3 doses as needed for chest pain (if no relief after 2nd dose, proceed to the ED for an evalution or call 911).   pantoprazole 40 MG tablet Commonly known as: PROTONIX Take 1 tablet (40 mg total) by mouth every evening.   potassium chloride 10 MEQ tablet Commonly known as: KLOR-CON M Take 1 tablet (10 mEq total) by mouth daily.   rosuvastatin 20 MG tablet Commonly known as: CRESTOR Take 1 tablet (20 mg total) by mouth daily. What changed: when to take this   silodosin 8 MG Caps capsule Commonly known as:  RAPAFLO Take 1 capsule (8 mg total) by mouth daily with breakfast.   VITAMIN B-12 PO Take 1 tablet by mouth every evening.   VITAMIN D-3 PO Take 1 tablet by mouth every evening.   VITAMIN K2 PO Take 1 tablet by mouth every evening.        Discharge Exam: Filed Weights   03/04/23 1350 03/04/23 1804 03/05/23 0500  Weight: 104.3 kg 105.3 kg 106 kg        General:  AAO x 3,  cooperative, no distress;   HEENT:  Normocephalic, PERRL, otherwise with in Normal limits   Neuro:  CNII-XII intact. , normal motor and sensation, reflexes intact   Lungs:   Clear to auscultation BL, Respirations unlabored,  No wheezes / crackles  Cardio:    S1/S2, RRR, No murmure, No Rubs or Gallops   Abdomen:  Soft, non-tender, bowel sounds active all four quadrants, no guarding or peritoneal signs.  Muscular  skeletal:  Limited exam -global generalized weaknesses - in bed, able to move all 4 extremities,   2+ pulses,  symmetric, No pitting edema  Skin:  Dry, warm to touch, negative for any Rashes,  Wounds: Please see nursing documentation           Condition at discharge: good  The results of significant diagnostics from this hospitalization (including imaging, microbiology, ancillary and laboratory) are listed below for reference.   Imaging Studies: DG Chest Portable 1 View Result Date: 03/04/2023 CLINICAL DATA:  Shortness of breath. EXAM: PORTABLE CHEST 1 VIEW COMPARISON:  Chest radiograph dated 01/05/2023. FINDINGS: There is mild cardiomegaly with mild central vascular congestion. No focal consolidation, pleural effusion, pneumothorax. No acute osseous pathology. IMPRESSION: Mild cardiomegaly with mild central vascular congestion. Electronically Signed   By: Elgie Collard M.D.   On: 03/04/2023 15:44    Microbiology: Results for orders placed or performed during the hospital encounter of 03/04/23  Resp panel by RT-PCR (RSV, Flu A&B, Covid) Anterior Nasal Swab     Status: None   Collection Time: 03/04/23  2:19 PM   Specimen: Anterior Nasal Swab  Result Value Ref Range Status   SARS Coronavirus 2 by RT PCR NEGATIVE NEGATIVE Final    Comment: (NOTE) SARS-CoV-2 target nucleic acids are NOT DETECTED.  The SARS-CoV-2 RNA is generally detectable in upper respiratory specimens during the acute phase of infection. The lowest concentration of SARS-CoV-2 viral copies this assay can detect is 138 copies/mL. A negative result does not preclude SARS-Cov-2 infection and should not be used as the sole basis for treatment or other patient management decisions. A negative result may occur with  improper specimen collection/handling, submission of specimen other than nasopharyngeal swab, presence of viral mutation(s) within the areas targeted by this assay, and inadequate number of viral copies(<138 copies/mL). A negative result must be combined with clinical observations, patient history, and epidemiological information. The expected result is Negative.  Fact Sheet for Patients:   BloggerCourse.com  Fact Sheet for Healthcare Providers:  SeriousBroker.it  This test is no t yet approved or cleared by the [place] FDA and  has been authorized for detection and/or diagnosis of SARS-CoV-2 by FDA under an Emergency Use Authorization (EUA). This EUA will remain  in effect (meaning this test can be used) for the duration of the COVID-19 declaration under Section 564(b)(1) of the Act, 21 U.S.C.section 360bbb-3(b)(1), unless the authorization is terminated  or revoked sooner.       Influenza A by PCR NEGATIVE NEGATIVE Final  Influenza B by PCR NEGATIVE NEGATIVE Final    Comment: (NOTE) The Xpert Xpress SARS-CoV-2/FLU/RSV plus assay is intended as an aid in the diagnosis of influenza from Nasopharyngeal swab specimens and should not be used as a sole basis for treatment. Nasal washings and aspirates are unacceptable for Xpert Xpress SARS-CoV-2/FLU/RSV testing.  Fact Sheet for Patients: BloggerCourse.com  Fact Sheet for Healthcare Providers: SeriousBroker.it  This test is not yet approved or cleared by the [place] FDA and has been authorized for detection and/or diagnosis of SARS-CoV-2 by FDA under an Emergency Use Authorization (EUA). This EUA will remain in effect (meaning this test can be used) for the duration of the COVID-19 declaration under Section 564(b)(1) of the Act, 21 U.S.C. section 360bbb-3(b)(1), unless the authorization is terminated or revoked.     Resp Syncytial Virus by PCR NEGATIVE NEGATIVE Final    Comment: (NOTE) Fact Sheet for Patients: BloggerCourse.com  Fact Sheet for Healthcare Providers: SeriousBroker.it  This test is not yet approved or cleared by the [place] FDA and has been authorized for detection and/or diagnosis of SARS-CoV-2 by FDA under an Emergency Use  Authorization (EUA). This EUA will remain in effect (meaning this test can be used) for the duration of the COVID-19 declaration under Section 564(b)(1) of the Act, 21 U.S.C. section 360bbb-3(b)(1), unless the authorization is terminated or revoked.  Performed at [facility], [address] [place], [place] [address]     Labs: CBC: Recent Labs  Lab 03/04/23 1415 03/05/23 0411  WBC 9.6 8.1  NEUTROABS 7.3  --   HGB 12.2* 12.3*  HCT 38.4* 39.9  MCV 89.9 91.1  PLT 184 191   Basic Metabolic Panel: Recent Labs  Lab 03/04/23 1415 03/05/23 0411  NA 139 142  K 3.7 4.1  CL 107 109  CO2 23 26  GLUCOSE 95 199*  BUN 14 17  CREATININE 1.09 1.32*  CALCIUM 9.7 10.1   Liver Function Tests: Recent Labs  Lab 03/04/23 1415  AST 23  ALT 16  ALKPHOS 32*  BILITOT 0.9  PROT 6.5  ALBUMIN 3.6   CBG: Recent Labs  Lab 03/04/23 2000 03/05/23 0515 03/05/23 0741 03/05/23 1106  GLUCAP 117* 168* 162* 195*    Discharge time spent: greater than 30 minutes.  Signed: Kendell Bane, MD [facility] 03/05/2023

## 2023-03-06 ENCOUNTER — Other Ambulatory Visit: Payer: Self-pay | Admitting: Cardiovascular Disease

## 2023-03-06 NOTE — Telephone Encounter (Signed)
 Prescription refill request for Eliquis received. Indication:afib Last office visit:1/25 Scr:1.32  2/25 Age: 78 Weight:106  kg  Prescription refilled

## 2023-03-11 NOTE — Progress Notes (Unsigned)
 Cardiology Office Note    Date:  03/12/2023  ID:  William [name], William [name] 11/11/1945, MRN [id number] Cardiologist: Charlton Haws, MD    History of Present Illness:    William [name] is a 78 y.o. male with past medical history of CAD (s/p DES to distal RCA in 08/2013, PCI/DES to LAD in 07/2021), aortic stenosis (s/p TAVR in 09/2021), paroxysmal atrial fibrillation, HTN, HLD and Type 2 DM who presents to the office today for hospital follow-up.   Admitted to [facility] from 7/29 - 08/05/2022 for evaluation of significant fatigue and tachycardia which had occurred while at cardiac rehab and was consistent with atrial fibrillation with RVR. He converted back to NSR prior to ER evaluation and was monitored overnight and maintained normal sinus rhythm. He had been on Lopressor 25 mg twice daily and this was titrated to 37.5 mg twice daily. It was recommended if he had issues with low heart rates over time, would refer to EP. Troponin values did peak at 180 which was felt to be secondary to demand ischemia. Repeat echocardiogram showed a normal EF of 55 to 60% with moderate LVH, normal RV function, mild MR and his TAVR valve was functioning normally with a mean gradient of 16 mmHg.  He has had no recurrence of PAF. No angina   Admitted 01/05/23 with dyspnea No angina. R/O CXR with cephalization He was in sinus with no recurrent PAF. COVID negative Improved with iv lasix. His lasix had been d/c earlier in year due to his CRF D/c CR better 1.07 with K 3.9 D/c on lasix 40 mg daily Baseline weight at home 225.7 lbs   TTE 01/05/23 with EF 50-55% mild MR normal TAVR valve  Admitted 03/04/23 and early January with dyspnea ? Diastolic CHF Rx with lasix BNP only 144 troponin negative CXR CE cephalization. Echo not repeated D/C weight 106 kg No recurrent PAF Lasix 40 mg daily K 4.1 BUN 17 Cr 1.32   Breathing at baseline since d/c. Lives alone but has neighbors and 4 children that look in on him Hygiene needs to improve  and discussed clipping his nails    Studies Reviewed:   EKG:  08/20/22 NSR rate 64 chronic LBBB    Event Monitor: 04/2022 Patch Wear Time:  13 days and 23 hours (2024-03-14T17:42:16-0400 to 2024-03-28T17:32:36-0400)   Patient had a min HR of 51 bpm, max HR of 184 bpm, and avg HR of 71 bpm. Predominant underlying rhythm was Sinus Rhythm. Bundle Branch Block/IVCD was present. QRS morphology changes were present throughout recording. 1 run of Ventricular Tachycardia  occurred lasting 4 beats with a max rate of 184 bpm (avg 158 bpm). 1 run of Supraventricular Tachycardia occurred lasting 9 beats with a max rate of 102 bpm (avg 93 bpm). Atrial Fibrillation occurred (1% burden), ranging from 68-153 bpm (avg of 98 bpm),  the longest lasting 5 hours 0 mins with an avg rate of 98 bpm. Isolated SVEs were rare (<1.0%), SVE Couplets were rare (<1.0%), and SVE Triplets were rare (<1.0%). Isolated VEs were occasional (1.9%, 26299), VE Couplets were rare (<1.0%, 388), and VE  Triplets were rare (<1.0%, 3). Ventricular Bigeminy and Trigeminy were present.  Echocardiogram 01/05/23  IMPRESSIONS     1. Left ventricular ejection fraction, by estimation, is 50 to 55%. The  left ventricle has low normal function. The left ventricle has no regional  wall motion abnormalities. The left ventricular internal cavity size was  mildly dilated. There is mild  left ventricular hypertrophy. Left ventricular diastolic parameters were  normal.   2. Right ventricular systolic function is normal. The right ventricular  size is normal.   3. Left atrial size was moderately dilated.   4. The mitral valve is degenerative. Trivial mitral valve regurgitation.  No evidence of mitral stenosis. Severe mitral annular calcification.   5. 23 mm Sapien 3 stented TAVR valve Since TTE 08/05/22 gradients fairly  stable from mean 17-19.6 mmHg and peak 30.4 ->33.4 mmHg No signficant PVL  . The aortic valve has been repaired/replaced.  Aortic valve regurgitation  is not visualized. No aortic  stenosis is present.   6. Aortic dilatation noted. There is dilatation of the aortic root,  measuring 40 mm.   7. The inferior vena cava is dilated in size with >50% respiratory  variability, suggesting right atrial pressure of 8 mmHg     Risk Assessment/Calculations:    CHA2DS2-VASc Score = 5   This indicates a 7.2% annual risk of stroke. The patient's score is based upon: CHF History: 0 HTN History: 1 Diabetes History: 1 Stroke History: 0 Vascular Disease History: 1 Age Score: 2 Gender Score: 0     Physical Exam:   VS:  BP (!) 122/58 (BP Location: Left Arm, Patient Position: Sitting, Cuff Size: Large)   Pulse 65   Ht 5\' 9"  (1.753 m)   Wt 233 lb (105.7 kg)   SpO2 98%   BMI 34.41 kg/m    Wt Readings from Last 3 Encounters:  03/12/23 233 lb (105.7 kg)  03/05/23 233 lb 9.6 oz (106 kg)  01/09/23 226 lb 6.4 oz (102.7 kg)     Affect appropriate Elderly male  HEENT: normal Neck supple with no adenopathy JVP normal left  bruits no thyromegaly Lungs clear with no wheezing and good diaphragmatic motion Heart:  S1/S2 SEM through TAVR valve no AR  murmur, no rub, gallop or click PMI normal Abdomen: benighn, BS positve, no tenderness, no AAA no bruit.  No HSM or HJR Distal pulses intact with no bruits Plus one bilateral edema Neuro non-focal Skin warm and dry No muscular weakness    Assessment and Plan:   1. Paroxysmal Atrial Fibrillation - Maintaining NSR continue  Lopressor 37.5 mg twice daily. We reviewed that if he continues to have recurrent arrhythmias, would refer to EP. - Continue Eliquis 5 mg twice daily for anticoagulation which is the appropriate dose at this time given his age, weight and renal function. CBC last month showed that his hemoglobin was stable at 13.1 with platelets at 174 K.   2. CAD - He underwent stenting to the distal RCA in 08/2013 and DES to the LAD in 07/2021.  He remains  active at baseline and denies any recent anginal symptoms. He is no longer on ASA given the need for anticoagulation. Continue Lopressor 37.5 mg twice daily and Crestor 20 mg daily.Given recurrent admission for "diastolic CHF" will order lexiscan myovue   3. Aortic Stenosis - He did undergo TAVR in 09/2021 and recent echocardiogram 08/05/22 showed his TAVR valve was functioning normally with a mean gradient of 16 mmHg. Update TTE  4. HTN - BP is well-controlled at 124/66 during today's visit. Continue current medical therapy with Amlodipine 5 mg daily, Lisinopril 5 mg daily and Lopressor 37.5 mg twice daily.  5. HLD - LDL was 60 in 02/2022. Continue Crestor 20 mg daily.  6. Stage 3 CKD - Cr 1.3 f/u nephrology BMET /BNP today  7. BPH:   -  sees McKenzie on finasteride and rapaflo   8.  CHF:  recent admission with no real change in EF, normal TAVR valve, no recurrent PAF. Improved with lasix now on 40 mg daily see above regarding r/o progressive CAD update TTE  Increase lasix to 40 mg am and 20 mg in pm with extra 20 mg for weight gain > 3 lbs  9.  Carotid Bruit:  left sided check duplex   BNP/BMET TTE Lexiscan Myovue Carotid duplex  F/U in 6 months   Signed, Charlton Haws, MD

## 2023-03-12 ENCOUNTER — Ambulatory Visit: Attending: Cardiovascular Disease | Admitting: Cardiovascular Disease

## 2023-03-12 ENCOUNTER — Encounter: Payer: Self-pay | Admitting: Cardiovascular Disease

## 2023-03-12 ENCOUNTER — Encounter: Payer: Self-pay | Admitting: *Deleted

## 2023-03-12 ENCOUNTER — Other Ambulatory Visit (HOSPITAL_COMMUNITY)
Admission: RE | Admit: 2023-03-12 | Discharge: 2023-03-12 | Disposition: A | Discharge status: Home / Self Care | Source: Ambulatory Visit | Attending: Cardiovascular Disease | Admitting: Cardiovascular Disease

## 2023-03-12 VITALS — BP 122/58 | HR 65 | Ht 69.0 in | Wt 233.0 lb

## 2023-03-12 DIAGNOSIS — I5032 Chronic diastolic (congestive) heart failure: Secondary | ICD-10-CM | POA: Diagnosis not present

## 2023-03-12 DIAGNOSIS — E1122 Type 2 diabetes mellitus with diabetic chronic kidney disease: Secondary | ICD-10-CM

## 2023-03-12 DIAGNOSIS — R0989 Other specified symptoms and signs involving the circulatory and respiratory systems: Secondary | ICD-10-CM

## 2023-03-12 DIAGNOSIS — I10 Essential (primary) hypertension: Secondary | ICD-10-CM

## 2023-03-12 DIAGNOSIS — Z952 Presence of prosthetic heart valve: Secondary | ICD-10-CM

## 2023-03-12 DIAGNOSIS — I480 Paroxysmal atrial fibrillation: Secondary | ICD-10-CM | POA: Diagnosis not present

## 2023-03-12 DIAGNOSIS — N183 Chronic kidney disease, stage 3 unspecified: Secondary | ICD-10-CM

## 2023-03-12 DIAGNOSIS — I2510 Atherosclerotic heart disease of native coronary artery without angina pectoris: Secondary | ICD-10-CM | POA: Diagnosis not present

## 2023-03-12 DIAGNOSIS — E785 Hyperlipidemia, unspecified: Secondary | ICD-10-CM | POA: Diagnosis not present

## 2023-03-12 LAB — BRAIN NATRIURETIC PEPTIDE: B Natriuretic Peptide: 117 pg/mL — ABNORMAL HIGH (ref 0.0–100.0)

## 2023-03-12 LAB — BASIC METABOLIC PANEL
Anion gap: 10 (ref 5–15)
BUN: 17 mg/dL (ref 8–23)
CO2: 25 mmol/L (ref 22–32)
Calcium: 10 mg/dL (ref 8.9–10.3)
Chloride: 106 mmol/L (ref 98–111)
Creatinine, Ser: 1.11 mg/dL (ref 0.61–1.24)
GFR, Estimated: >60 mL/min (ref >60)
Glucose, Bld: 75 mg/dL (ref 70–99)
Potassium: 3.8 mmol/L (ref 3.5–5.1)
Sodium: 141 mmol/L (ref 135–145)

## 2023-03-12 MED ORDER — FUROSEMIDE 40 MG PO TABS: 40.0000 mg | ORAL_TABLET | Freq: Two times a day (BID) | ORAL | 3 refills | Status: DC

## 2023-03-12 NOTE — Patient Instructions (Signed)
 Medication Instructions:  Your physician recommends that you continue on your current medications as directed. Please refer to the Current Medication list given to you today.  *If you need a refill on your cardiac medications before your next appointment, please call your pharmacy*  Take lasix 40 mg in the morning and 20 mg in the afternoon.   Lab Work: Your physician recommends that you return for lab work in: Today   If you have labs (blood work) drawn today and your tests are completely normal, you will receive your results only by: [employer] (if you have MyChart) OR A paper copy in the mail If you have any lab test that is abnormal or we need to change your treatment, we will call you to review the results.   Testing/Procedures: Your physician has requested that you have a lexiscan myoview. For further information please visit [email or web address] Please follow instruction sheet, as given.   Your physician has requested that you have a carotid duplex. This test is an ultrasound of the carotid arteries in your neck. It looks at blood flow through these arteries that supply the brain with blood. Allow one hour for this exam. There are no restrictions or special instructions.  Your physician has requested that you have an echocardiogram. Echocardiography is a painless test that uses sound waves to create images of your heart. It provides your doctor with information about the size and shape of your heart and how well your heart's chambers and valves are working. This procedure takes approximately one hour. There are no restrictions for this procedure. Please do NOT wear cologne, perfume, aftershave, or lotions (deodorant is allowed). Please arrive 15 minutes prior to your appointment time.  Please note: We ask at that you not bring children with you during ultrasound (echo/ vascular) testing. Due to room size and safety concerns, children are not allowed in the ultrasound rooms during  exams. Our front office staff cannot provide observation of children in our lobby area while testing is being conducted. An adult accompanying a patient to their appointment will only be allowed in the ultrasound room at the discretion of the ultrasound technician under special circumstances. We apologize for any inconvenience.    Follow-Up: At [facility], you and your health needs are our priority.  As part of our continuing mission to provide you with exceptional heart care, we have created designated Provider Care Teams.  These Care Teams include your primary Cardiologist (physician) and Advanced Practice Providers (APPs -  Physician Assistants and Nurse Practitioners) who all work together to provide you with the care you need, when you need it.  We recommend signing up for the patient portal called "MyChart".  Sign up information is provided on this After Visit Summary.  MyChart is used to connect with patients for Virtual Visits (Telemedicine).  Patients are able to view lab/test results, encounter notes, upcoming appointments, etc.  Non-urgent messages can be sent to your provider as well.   To learn more about what you can do with MyChart, go to [email or web address].    Your next appointment:   6 month(s)  Provider:   You may see Charlton Haws, MD or one of the following Advanced Practice Providers on your designated Care Team:   Randall An, PA-C  Jacolyn Reedy, PA-C     Other Instructions Thank you for choosing [facility]!

## 2023-03-19 ENCOUNTER — Ambulatory Visit (HOSPITAL_COMMUNITY)
Admission: RE | Admit: 2023-03-19 | Discharge: 2023-03-19 | Disposition: A | Discharge status: Home / Self Care | Source: Ambulatory Visit | Attending: Cardiovascular Disease | Admitting: Cardiovascular Disease

## 2023-03-19 DIAGNOSIS — R0989 Other specified symptoms and signs involving the circulatory and respiratory systems: Secondary | ICD-10-CM | POA: Insufficient documentation

## 2023-03-20 DIAGNOSIS — E6609 Other obesity due to excess calories: Secondary | ICD-10-CM | POA: Diagnosis not present

## 2023-03-20 DIAGNOSIS — Z6834 Body mass index (BMI) 34.0-34.9, adult: Secondary | ICD-10-CM | POA: Diagnosis not present

## 2023-03-20 DIAGNOSIS — E1140 Type 2 diabetes mellitus with diabetic neuropathy, unspecified: Secondary | ICD-10-CM | POA: Diagnosis not present

## 2023-03-20 DIAGNOSIS — M150 Primary generalized (osteo)arthritis: Secondary | ICD-10-CM | POA: Diagnosis not present

## 2023-03-20 DIAGNOSIS — E1165 Type 2 diabetes mellitus with hyperglycemia: Secondary | ICD-10-CM | POA: Diagnosis not present

## 2023-03-20 DIAGNOSIS — E1322 Other specified diabetes mellitus with diabetic chronic kidney disease: Secondary | ICD-10-CM | POA: Diagnosis not present

## 2023-03-20 DIAGNOSIS — N183 Chronic kidney disease, stage 3 unspecified: Secondary | ICD-10-CM | POA: Diagnosis not present

## 2023-03-20 DIAGNOSIS — G894 Chronic pain syndrome: Secondary | ICD-10-CM | POA: Diagnosis not present

## 2023-03-20 DIAGNOSIS — E1129 Type 2 diabetes mellitus with other diabetic kidney complication: Secondary | ICD-10-CM | POA: Diagnosis not present

## 2023-03-20 DIAGNOSIS — I480 Paroxysmal atrial fibrillation: Secondary | ICD-10-CM | POA: Diagnosis not present

## 2023-03-26 ENCOUNTER — Telehealth (HOSPITAL_COMMUNITY): Payer: Self-pay | Admitting: Surgery

## 2023-03-27 ENCOUNTER — Encounter (HOSPITAL_COMMUNITY): Admission: RE | Admit: 2023-03-27 | Source: Ambulatory Visit

## 2023-03-27 ENCOUNTER — Inpatient Hospital Stay (HOSPITAL_COMMUNITY): Admission: RE | Admit: 2023-03-27 | Source: Ambulatory Visit

## 2023-03-27 ENCOUNTER — Ambulatory Visit (HOSPITAL_COMMUNITY): Admission: RE | Admit: 2023-03-27 | Source: Ambulatory Visit

## 2023-03-28 ENCOUNTER — Other Ambulatory Visit: Payer: Self-pay | Admitting: Internal Medicine

## 2023-03-31 DIAGNOSIS — K591 Functional diarrhea: Secondary | ICD-10-CM | POA: Diagnosis not present

## 2023-03-31 DIAGNOSIS — E6609 Other obesity due to excess calories: Secondary | ICD-10-CM | POA: Diagnosis not present

## 2023-03-31 DIAGNOSIS — Z6833 Body mass index (BMI) 33.0-33.9, adult: Secondary | ICD-10-CM | POA: Diagnosis not present

## 2023-03-31 DIAGNOSIS — T50905A Adverse effect of unspecified drugs, medicaments and biological substances, initial encounter: Secondary | ICD-10-CM | POA: Diagnosis not present

## 2023-04-08 ENCOUNTER — Encounter (HOSPITAL_COMMUNITY)

## 2023-04-10 ENCOUNTER — Other Ambulatory Visit: Payer: Self-pay

## 2023-04-10 DIAGNOSIS — M810 Age-related osteoporosis without current pathological fracture: Secondary | ICD-10-CM

## 2023-04-16 ENCOUNTER — Ambulatory Visit: Payer: PPO | Admitting: "Endocrinology

## 2023-04-16 ENCOUNTER — Other Ambulatory Visit: Payer: Self-pay

## 2023-04-16 DIAGNOSIS — M810 Age-related osteoporosis without current pathological fracture: Secondary | ICD-10-CM

## 2023-04-16 DIAGNOSIS — E212 Other hyperparathyroidism: Secondary | ICD-10-CM

## 2023-04-20 NOTE — Progress Notes (Deleted)
 Cardiology Office Note    Date:  04/20/2023  ID:  William, [name] May 15, 1945, MRN [id number] Cardiologist: William Haws, MD    History of Present Illness:    William [name] is a 78 y.o. male with past medical history of CAD (s/p DES to distal RCA in 08/2013, PCI/DES to LAD in 07/2021), aortic stenosis (s/p TAVR in 09/2021), paroxysmal atrial fibrillation, HTN, HLD and Type 2 DM who presents to the office today for hospital follow-up.   Admitted to [facility] from 7/29 - 08/05/2022 for evaluation of significant fatigue and tachycardia which had occurred while at cardiac rehab and was consistent with atrial fibrillation with RVR. He converted back to NSR prior to ER evaluation and was monitored overnight and maintained normal sinus rhythm. He had been on Lopressor 25 mg twice daily and this was titrated to 37.5 mg twice daily. It was recommended if he had issues with low heart rates over time, would refer to EP. Troponin values did peak at 180 which was felt to be secondary to demand ischemia. Repeat echocardiogram showed a normal EF of 55 to 60% with moderate LVH, normal RV function, mild MR and his TAVR valve was functioning normally with a mean gradient of 16 mmHg.  He has had no recurrence of PAF. No angina   Admitted 01/05/23 with dyspnea No angina. R/O CXR with cephalization He was in sinus with no recurrent PAF. COVID negative Improved with iv lasix. His lasix had been d/c earlier in year due to his CRF D/c CR better 1.07 with K 3.9 D/c on lasix 40 mg daily Baseline weight at home 225.7 lbs   TTE 01/05/23 with EF 50-55% mild MR normal TAVR valve  Admitted 03/04/23 and early January with dyspnea ? Diastolic CHF Rx with lasix BNP only 144 troponin negative CXR CE cephalization. Echo not repeated D/C weight 106 kg No recurrent PAF Lasix 40 mg daily K 4.1 BUN 17 Cr 1.32   Breathing at baseline since d/c. Lives alone but has neighbors and 4 children that look in on him Hygiene needs to improve  and discussed clipping his nails   ***   Studies Reviewed:   EKG:  08/20/22 NSR rate 64 chronic LBBB    Event Monitor: 04/2022 Patch Wear Time:  13 days and 23 hours (2024-03-14T17:42:16-0400 to 2024-03-28T17:32:36-0400)   Patient had a min HR of 51 bpm, max HR of 184 bpm, and avg HR of 71 bpm. Predominant underlying rhythm was Sinus Rhythm. Bundle Branch Block/IVCD was present. QRS morphology changes were present throughout recording. 1 run of Ventricular Tachycardia  occurred lasting 4 beats with a max rate of 184 bpm (avg 158 bpm). 1 run of Supraventricular Tachycardia occurred lasting 9 beats with a max rate of 102 bpm (avg 93 bpm). Atrial Fibrillation occurred (1% burden), ranging from 68-153 bpm (avg of 98 bpm),  the longest lasting 5 hours 0 mins with an avg rate of 98 bpm. Isolated SVEs were rare (<1.0%), SVE Couplets were rare (<1.0%), and SVE Triplets were rare (<1.0%). Isolated VEs were occasional (1.9%, 26299), VE Couplets were rare (<1.0%, 388), and VE  Triplets were rare (<1.0%, 3). Ventricular Bigeminy and Trigeminy were present.  Echocardiogram 01/05/23  IMPRESSIONS     1. Left ventricular ejection fraction, by estimation, is 50 to 55%. The  left ventricle has low normal function. The left ventricle has no regional  wall motion abnormalities. The left ventricular internal cavity size was  mildly dilated. There is  mild  left ventricular hypertrophy. Left ventricular diastolic parameters were  normal.   2. Right ventricular systolic function is normal. The right ventricular  size is normal.   3. Left atrial size was moderately dilated.   4. The mitral valve is degenerative. Trivial mitral valve regurgitation.  No evidence of mitral stenosis. Severe mitral annular calcification.   5. 23 mm Sapien 3 stented TAVR valve Since TTE 08/05/22 gradients fairly  stable from mean 17-19.6 mmHg and peak 30.4 ->33.4 mmHg No signficant PVL  . The aortic valve has been  repaired/replaced. Aortic valve regurgitation  is not visualized. No aortic  stenosis is present.   6. Aortic dilatation noted. There is dilatation of the aortic root,  measuring 40 mm.   7. The inferior vena cava is dilated in size with >50% respiratory  variability, suggesting right atrial pressure of 8 mmHg     Risk Assessment/Calculations:    CHA2DS2-VASc Score = 5   This indicates a 7.2% annual risk of stroke. The patient's score is based upon: CHF History: 0 HTN History: 1 Diabetes History: 1 Stroke History: 0 Vascular Disease History: 1 Age Score: 2 Gender Score: 0     Physical Exam:   VS:  There were no vitals taken for this visit.   Wt Readings from Last 3 Encounters:  03/12/23 233 lb (105.7 kg)  03/05/23 233 lb 9.6 oz (106 kg)  01/09/23 226 lb 6.4 oz (102.7 kg)     Affect appropriate Elderly male  HEENT: normal Neck supple with no adenopathy JVP normal left  bruits no thyromegaly Lungs clear with no wheezing and good diaphragmatic motion Heart:  S1/S2 SEM through TAVR valve no AR  murmur, no rub, gallop or click PMI normal Abdomen: benighn, BS positve, no tenderness, no AAA no bruit.  No HSM or HJR Distal pulses intact with no bruits Plus one bilateral edema Neuro non-focal Skin warm and dry No muscular weakness    Assessment and Plan:   1. Paroxysmal Atrial Fibrillation - Maintaining NSR continue  Lopressor 37.5 mg twice daily. We reviewed that if he continues to have recurrent arrhythmias, would refer to EP. - Continue Eliquis 5 mg twice daily for anticoagulation which is the appropriate dose at this time given his age, weight and renal function. CBC last month showed that his hemoglobin was stable at 13.1 with platelets at 174 K.   2. CAD - He underwent stenting to the distal RCA in 08/2013 and DES to the LAD in 07/2021.  He remains active at baseline and denies any recent anginal symptoms. He is no longer on ASA given the need for  anticoagulation. Continue Lopressor 37.5 mg twice daily and Crestor 20 mg daily.Given recurrent admission for "diastolic CHF" will order lexiscan myovue   3. Aortic Stenosis - He did undergo TAVR in 09/2021 and recent echocardiogram 08/05/22 showed his TAVR valve was functioning normally with a mean gradient of 16 mmHg. Update TTE  4. HTN - BP is well-controlled at 124/66 during today's visit. Continue current medical therapy with Amlodipine 5 mg daily, Lisinopril 5 mg daily and Lopressor 37.5 mg twice daily.  5. HLD - LDL was 60 in 02/2022. Continue Crestor 20 mg daily.  6. Stage 3 CKD - Cr 1.1 and K 3.8 03/12/23   7. BPH:   -sees McKenzie on finasteride and rapaflo   8.  CHF:  03/04/23  admission with no real change in EF, normal TAVR valve, no recurrent PAF. Improved with lasix now  on 40 mg in am and 20 mg in pm BNP only 117 03/12/23 At that time K 3.8 Cr 1.1   9.  Carotid Bruit:  plaque no stenosis on duplex 03/19/23   TTE Lexiscan Myovue  F/U in 6 months   Signed, Janelle Mediate, MD

## 2023-04-21 ENCOUNTER — Ambulatory Visit (HOSPITAL_COMMUNITY)
Admission: RE | Admit: 2023-04-21 | Discharge: 2023-04-21 | Disposition: A | Discharge status: Home / Self Care | Source: Ambulatory Visit | Attending: Cardiovascular Disease | Admitting: Cardiovascular Disease

## 2023-04-21 DIAGNOSIS — I5032 Chronic diastolic (congestive) heart failure: Secondary | ICD-10-CM

## 2023-04-21 LAB — ECHOCARDIOGRAM COMPLETE
AR max vel: 1.11 cm2
AV Area VTI: 1.11 cm2
AV Area mean vel: 0.98 cm2
AV Mean grad: 13.8 mmHg
AV Peak grad: 24.2 mmHg
Ao pk vel: 2.46 m/s
Area-P 1/2: 3.17 cm2
Calc EF: 55.6 %
Est EF: 45
MV M vel: 4.97 m/s
MV Peak grad: 98.8 mmHg
MV VTI: 1.66 cm2
Radius: 0.5 cm
S' Lateral: 3.8 cm
Single Plane A2C EF: 60.5 %
Single Plane A4C EF: 50.2 %

## 2023-04-21 NOTE — Progress Notes (Signed)
  Echocardiogram 2D Echocardiogram has been performed.  Lanitra Battaglini L Kingjames Coury RDCS 04/21/2023, 10:06 AM

## 2023-04-22 ENCOUNTER — Telehealth: Payer: Self-pay | Admitting: Pharmacy Technician

## 2023-04-22 ENCOUNTER — Other Ambulatory Visit (HOSPITAL_COMMUNITY): Payer: Self-pay

## 2023-04-22 ENCOUNTER — Telehealth: Payer: Self-pay | Admitting: Cardiovascular Disease

## 2023-04-22 DIAGNOSIS — I480 Paroxysmal atrial fibrillation: Secondary | ICD-10-CM

## 2023-04-22 DIAGNOSIS — I10 Essential (primary) hypertension: Secondary | ICD-10-CM

## 2023-04-22 MED ORDER — METOPROLOL TARTRATE 25 MG PO TABS: 37.5000 mg | ORAL_TABLET | Freq: Two times a day (BID) | ORAL | 5 refills | Status: DC

## 2023-04-22 NOTE — Telephone Encounter (Signed)
 Rx changed to 25mg  1.5tabs BID. No charge.

## 2023-04-22 NOTE — Telephone Encounter (Signed)
 Patient walked in and said his insurance doesn't pay for Metoprolol Tartrate it needs to be Metoprolol Succinate. He is down to two pills and, needs it sent to [facility] in [place].

## 2023-04-22 NOTE — Telephone Encounter (Signed)
 So it looks like he got at lopressor pill 37.5 mg, I suspect this is why his insurance will not pay.   I will have PharmD take a look at this,looks like he needs a 25 mg tablet and to take 1 1/2 tablets twice a day.

## 2023-04-22 NOTE — Telephone Encounter (Signed)
 Left message for patient to return call.    Patient understands the reason for his lopressor change and verbalized understanding that he will take lopressor 25 mg 1 1/2 tablets (37.5 mg total)  twice a day.

## 2023-04-22 NOTE — Telephone Encounter (Signed)
 I ran metoprolol tartrate 25mg  qty 270 for 90 days and it went through for 0.00

## 2023-04-22 NOTE — Telephone Encounter (Signed)
 Patient returned RN's call.

## 2023-04-23 ENCOUNTER — Ambulatory Visit: Admitting: "Endocrinology

## 2023-04-23 DIAGNOSIS — M9905 Segmental and somatic dysfunction of pelvic region: Secondary | ICD-10-CM | POA: Diagnosis not present

## 2023-04-23 DIAGNOSIS — M62830 Muscle spasm of back: Secondary | ICD-10-CM | POA: Diagnosis not present

## 2023-04-23 DIAGNOSIS — M9902 Segmental and somatic dysfunction of thoracic region: Secondary | ICD-10-CM | POA: Diagnosis not present

## 2023-04-23 DIAGNOSIS — M9903 Segmental and somatic dysfunction of lumbar region: Secondary | ICD-10-CM | POA: Diagnosis not present

## 2023-04-27 ENCOUNTER — Ambulatory Visit: Payer: PPO | Admitting: Cardiovascular Disease

## 2023-04-27 DIAGNOSIS — R809 Proteinuria, unspecified: Secondary | ICD-10-CM | POA: Diagnosis not present

## 2023-04-27 DIAGNOSIS — M9905 Segmental and somatic dysfunction of pelvic region: Secondary | ICD-10-CM | POA: Diagnosis not present

## 2023-04-27 DIAGNOSIS — D631 Anemia in chronic kidney disease: Secondary | ICD-10-CM | POA: Diagnosis not present

## 2023-04-27 DIAGNOSIS — M9902 Segmental and somatic dysfunction of thoracic region: Secondary | ICD-10-CM | POA: Diagnosis not present

## 2023-04-27 DIAGNOSIS — M9903 Segmental and somatic dysfunction of lumbar region: Secondary | ICD-10-CM | POA: Diagnosis not present

## 2023-04-27 DIAGNOSIS — M62830 Muscle spasm of back: Secondary | ICD-10-CM | POA: Diagnosis not present

## 2023-04-27 DIAGNOSIS — N189 Chronic kidney disease, unspecified: Secondary | ICD-10-CM | POA: Diagnosis not present

## 2023-04-29 DIAGNOSIS — M9902 Segmental and somatic dysfunction of thoracic region: Secondary | ICD-10-CM | POA: Diagnosis not present

## 2023-04-29 DIAGNOSIS — M9903 Segmental and somatic dysfunction of lumbar region: Secondary | ICD-10-CM | POA: Diagnosis not present

## 2023-04-29 DIAGNOSIS — M62830 Muscle spasm of back: Secondary | ICD-10-CM | POA: Diagnosis not present

## 2023-04-29 DIAGNOSIS — M9905 Segmental and somatic dysfunction of pelvic region: Secondary | ICD-10-CM | POA: Diagnosis not present

## 2023-05-04 ENCOUNTER — Other Ambulatory Visit: Payer: Self-pay | Admitting: Urology

## 2023-05-04 DIAGNOSIS — M62830 Muscle spasm of back: Secondary | ICD-10-CM | POA: Diagnosis not present

## 2023-05-04 DIAGNOSIS — M9903 Segmental and somatic dysfunction of lumbar region: Secondary | ICD-10-CM | POA: Diagnosis not present

## 2023-05-04 DIAGNOSIS — M9902 Segmental and somatic dysfunction of thoracic region: Secondary | ICD-10-CM | POA: Diagnosis not present

## 2023-05-04 DIAGNOSIS — M9905 Segmental and somatic dysfunction of pelvic region: Secondary | ICD-10-CM | POA: Diagnosis not present

## 2023-05-04 DIAGNOSIS — N200 Calculus of kidney: Secondary | ICD-10-CM

## 2023-05-06 DIAGNOSIS — M9905 Segmental and somatic dysfunction of pelvic region: Secondary | ICD-10-CM | POA: Diagnosis not present

## 2023-05-06 DIAGNOSIS — M9903 Segmental and somatic dysfunction of lumbar region: Secondary | ICD-10-CM | POA: Diagnosis not present

## 2023-05-06 DIAGNOSIS — M62830 Muscle spasm of back: Secondary | ICD-10-CM | POA: Diagnosis not present

## 2023-05-06 DIAGNOSIS — M9902 Segmental and somatic dysfunction of thoracic region: Secondary | ICD-10-CM | POA: Diagnosis not present

## 2023-05-12 DIAGNOSIS — M9903 Segmental and somatic dysfunction of lumbar region: Secondary | ICD-10-CM | POA: Diagnosis not present

## 2023-05-12 DIAGNOSIS — M9902 Segmental and somatic dysfunction of thoracic region: Secondary | ICD-10-CM | POA: Diagnosis not present

## 2023-05-12 DIAGNOSIS — M9905 Segmental and somatic dysfunction of pelvic region: Secondary | ICD-10-CM | POA: Diagnosis not present

## 2023-05-12 DIAGNOSIS — M62830 Muscle spasm of back: Secondary | ICD-10-CM | POA: Diagnosis not present

## 2023-05-15 DIAGNOSIS — M9905 Segmental and somatic dysfunction of pelvic region: Secondary | ICD-10-CM | POA: Diagnosis not present

## 2023-05-15 DIAGNOSIS — M62830 Muscle spasm of back: Secondary | ICD-10-CM | POA: Diagnosis not present

## 2023-05-15 DIAGNOSIS — M9903 Segmental and somatic dysfunction of lumbar region: Secondary | ICD-10-CM | POA: Diagnosis not present

## 2023-05-15 DIAGNOSIS — M9902 Segmental and somatic dysfunction of thoracic region: Secondary | ICD-10-CM | POA: Diagnosis not present

## 2023-05-18 ENCOUNTER — Ambulatory Visit: Admitting: "Endocrinology

## 2023-05-18 DIAGNOSIS — M9905 Segmental and somatic dysfunction of pelvic region: Secondary | ICD-10-CM | POA: Diagnosis not present

## 2023-05-18 DIAGNOSIS — M62830 Muscle spasm of back: Secondary | ICD-10-CM | POA: Diagnosis not present

## 2023-05-18 DIAGNOSIS — M9903 Segmental and somatic dysfunction of lumbar region: Secondary | ICD-10-CM | POA: Diagnosis not present

## 2023-05-18 DIAGNOSIS — M9902 Segmental and somatic dysfunction of thoracic region: Secondary | ICD-10-CM | POA: Diagnosis not present

## 2023-05-22 DIAGNOSIS — M150 Primary generalized (osteo)arthritis: Secondary | ICD-10-CM | POA: Diagnosis not present

## 2023-05-22 DIAGNOSIS — Z6834 Body mass index (BMI) 34.0-34.9, adult: Secondary | ICD-10-CM | POA: Diagnosis not present

## 2023-05-22 DIAGNOSIS — N183 Chronic kidney disease, stage 3 unspecified: Secondary | ICD-10-CM | POA: Diagnosis not present

## 2023-05-22 DIAGNOSIS — E1165 Type 2 diabetes mellitus with hyperglycemia: Secondary | ICD-10-CM | POA: Diagnosis not present

## 2023-05-22 DIAGNOSIS — M9902 Segmental and somatic dysfunction of thoracic region: Secondary | ICD-10-CM | POA: Diagnosis not present

## 2023-05-22 DIAGNOSIS — G894 Chronic pain syndrome: Secondary | ICD-10-CM | POA: Diagnosis not present

## 2023-05-22 DIAGNOSIS — E1140 Type 2 diabetes mellitus with diabetic neuropathy, unspecified: Secondary | ICD-10-CM | POA: Diagnosis not present

## 2023-05-22 DIAGNOSIS — I480 Paroxysmal atrial fibrillation: Secondary | ICD-10-CM | POA: Diagnosis not present

## 2023-05-22 DIAGNOSIS — M9905 Segmental and somatic dysfunction of pelvic region: Secondary | ICD-10-CM | POA: Diagnosis not present

## 2023-05-22 DIAGNOSIS — E6609 Other obesity due to excess calories: Secondary | ICD-10-CM | POA: Diagnosis not present

## 2023-05-22 DIAGNOSIS — M9903 Segmental and somatic dysfunction of lumbar region: Secondary | ICD-10-CM | POA: Diagnosis not present

## 2023-05-22 DIAGNOSIS — M62830 Muscle spasm of back: Secondary | ICD-10-CM | POA: Diagnosis not present

## 2023-05-22 DIAGNOSIS — E1122 Type 2 diabetes mellitus with diabetic chronic kidney disease: Secondary | ICD-10-CM | POA: Diagnosis not present

## 2023-05-26 DIAGNOSIS — M9905 Segmental and somatic dysfunction of pelvic region: Secondary | ICD-10-CM | POA: Diagnosis not present

## 2023-05-26 DIAGNOSIS — M9902 Segmental and somatic dysfunction of thoracic region: Secondary | ICD-10-CM | POA: Diagnosis not present

## 2023-05-26 DIAGNOSIS — M62830 Muscle spasm of back: Secondary | ICD-10-CM | POA: Diagnosis not present

## 2023-05-26 DIAGNOSIS — M9903 Segmental and somatic dysfunction of lumbar region: Secondary | ICD-10-CM | POA: Diagnosis not present

## 2023-05-29 DIAGNOSIS — M9902 Segmental and somatic dysfunction of thoracic region: Secondary | ICD-10-CM | POA: Diagnosis not present

## 2023-05-29 DIAGNOSIS — M9903 Segmental and somatic dysfunction of lumbar region: Secondary | ICD-10-CM | POA: Diagnosis not present

## 2023-05-29 DIAGNOSIS — M62830 Muscle spasm of back: Secondary | ICD-10-CM | POA: Diagnosis not present

## 2023-05-29 DIAGNOSIS — M9905 Segmental and somatic dysfunction of pelvic region: Secondary | ICD-10-CM | POA: Diagnosis not present

## 2023-06-05 DIAGNOSIS — M9905 Segmental and somatic dysfunction of pelvic region: Secondary | ICD-10-CM | POA: Diagnosis not present

## 2023-06-05 DIAGNOSIS — M9902 Segmental and somatic dysfunction of thoracic region: Secondary | ICD-10-CM | POA: Diagnosis not present

## 2023-06-05 DIAGNOSIS — M9903 Segmental and somatic dysfunction of lumbar region: Secondary | ICD-10-CM | POA: Diagnosis not present

## 2023-06-05 DIAGNOSIS — M62830 Muscle spasm of back: Secondary | ICD-10-CM | POA: Diagnosis not present

## 2023-06-19 DIAGNOSIS — M62830 Muscle spasm of back: Secondary | ICD-10-CM | POA: Diagnosis not present

## 2023-06-19 DIAGNOSIS — M9905 Segmental and somatic dysfunction of pelvic region: Secondary | ICD-10-CM | POA: Diagnosis not present

## 2023-06-19 DIAGNOSIS — M9903 Segmental and somatic dysfunction of lumbar region: Secondary | ICD-10-CM | POA: Diagnosis not present

## 2023-06-19 DIAGNOSIS — M9902 Segmental and somatic dysfunction of thoracic region: Secondary | ICD-10-CM | POA: Diagnosis not present

## 2023-07-06 DIAGNOSIS — E1122 Type 2 diabetes mellitus with diabetic chronic kidney disease: Secondary | ICD-10-CM | POA: Diagnosis not present

## 2023-07-06 DIAGNOSIS — E782 Mixed hyperlipidemia: Secondary | ICD-10-CM | POA: Diagnosis not present

## 2023-07-09 DIAGNOSIS — M9905 Segmental and somatic dysfunction of pelvic region: Secondary | ICD-10-CM | POA: Diagnosis not present

## 2023-07-09 DIAGNOSIS — M9903 Segmental and somatic dysfunction of lumbar region: Secondary | ICD-10-CM | POA: Diagnosis not present

## 2023-07-09 DIAGNOSIS — M62830 Muscle spasm of back: Secondary | ICD-10-CM | POA: Diagnosis not present

## 2023-07-09 DIAGNOSIS — M9902 Segmental and somatic dysfunction of thoracic region: Secondary | ICD-10-CM | POA: Diagnosis not present

## 2023-07-13 ENCOUNTER — Other Ambulatory Visit: Payer: Self-pay | Admitting: Urology

## 2023-07-13 DIAGNOSIS — N200 Calculus of kidney: Secondary | ICD-10-CM

## 2023-08-05 DIAGNOSIS — M9902 Segmental and somatic dysfunction of thoracic region: Secondary | ICD-10-CM | POA: Diagnosis not present

## 2023-08-05 DIAGNOSIS — M62830 Muscle spasm of back: Secondary | ICD-10-CM | POA: Diagnosis not present

## 2023-08-05 DIAGNOSIS — M9903 Segmental and somatic dysfunction of lumbar region: Secondary | ICD-10-CM | POA: Diagnosis not present

## 2023-08-05 DIAGNOSIS — M9905 Segmental and somatic dysfunction of pelvic region: Secondary | ICD-10-CM | POA: Diagnosis not present

## 2023-08-15 ENCOUNTER — Other Ambulatory Visit: Payer: Self-pay | Admitting: Urology

## 2023-08-15 DIAGNOSIS — N200 Calculus of kidney: Secondary | ICD-10-CM

## 2023-08-20 ENCOUNTER — Other Ambulatory Visit: Payer: Self-pay | Admitting: Internal Medicine

## 2023-08-26 NOTE — Telephone Encounter (Signed)
Please arrange f/u.

## 2023-09-16 ENCOUNTER — Other Ambulatory Visit: Payer: Self-pay | Admitting: Cardiovascular Disease

## 2023-09-16 NOTE — Telephone Encounter (Signed)
 Prescription refill request for Eliquis  received. Indication:afib Last office visit:2/25 Scr:1.11  3/25 Age: 78 Weight:105.7  kg  Prescription refilled

## 2023-09-20 ENCOUNTER — Other Ambulatory Visit: Payer: Self-pay | Admitting: Urology

## 2023-09-20 DIAGNOSIS — N200 Calculus of kidney: Secondary | ICD-10-CM

## 2023-09-22 ENCOUNTER — Other Ambulatory Visit: Payer: Self-pay | Admitting: Urology

## 2023-09-22 DIAGNOSIS — N200 Calculus of kidney: Secondary | ICD-10-CM

## 2023-09-23 DIAGNOSIS — M9903 Segmental and somatic dysfunction of lumbar region: Secondary | ICD-10-CM | POA: Diagnosis not present

## 2023-09-23 DIAGNOSIS — M62830 Muscle spasm of back: Secondary | ICD-10-CM | POA: Diagnosis not present

## 2023-09-23 DIAGNOSIS — M9902 Segmental and somatic dysfunction of thoracic region: Secondary | ICD-10-CM | POA: Diagnosis not present

## 2023-09-23 DIAGNOSIS — M9905 Segmental and somatic dysfunction of pelvic region: Secondary | ICD-10-CM | POA: Diagnosis not present

## 2023-09-25 DIAGNOSIS — Z760 Encounter for issue of repeat prescription: Secondary | ICD-10-CM | POA: Diagnosis not present

## 2023-10-22 NOTE — Progress Notes (Signed)
 GI Office Note    Referring Provider: Bertell Satterfield, MD Primary Care Physician:  Bertell Satterfield, MD  Primary Gastroenterologist: Ozell Hollingshead, MD  Chief Complaint   Chief Complaint  Patient presents with   Follow-up    Pt states he doesn't know why he is here this morning. I looked back and its for refills and follow up GERD      History of Present Illness   William [name] is a 78 y.o. male presenting today for follow up GERD. Last seen 02/2022.  In 2020, he had 12 tubular adenomas ranging from 5-87mm in size. In 2023, he had 4 tubular adenomas, ranging from 2-44mm in size. Recommendations for another exam in 3 years, which may or may not be needed based on age. EGD 2021 with gastritis (Neg H.pylori) and small hiatal hernia.  Discussed the use of AI scribe software for clinical note transcription with the patient, who gave verbal consent to proceed.   He has been taking pantoprazole  consistently without missing doses and has not experienced heartburn in years. Previously, he had significant heartburn and indigestion, requiring frequent use of Tums. He denies any heartburn, dysphagia, abdominal pain. No constipation, diarrhea, melena, brbpr.   He underwent heart valve repair in 02/2023. He has history of afib. He is on Eliquis . He has slowly recovered. His Hgb in April was normal.    He is concerned about undergoing another colonoscopy next year (history of numerous adenomatous colon polyps), due to his heart condition and age. We discussed having him return next year and discuss with Dr. Hollingshead. Based on his overall health and any potential concerns at that time, decision can be made if colonoscopy should be completed.  Medications   Current Outpatient Medications  Medication Sig Dispense Refill   acarbose  (PRECOSE ) 100 MG tablet Take 100 mg by mouth 3 (three) times daily.     alendronate  (FOSAMAX ) 70 MG tablet Take 1 tablet (70 mg total) by mouth every 7 (seven) days. Take  with a full glass of water  on an empty stomach. 4 tablet 11   amLODipine  (NORVASC ) 5 MG tablet TAKE 1 TABLET(5 MG) BY MOUTH DAILY 90 tablet 0   Cholecalciferol  (VITAMIN D -3 PO) Take 1 tablet by mouth every evening.     Coenzyme Q10 (COQ10 PO) Take 1 capsule by mouth every evening.     Cyanocobalamin  (VITAMIN B-12 PO) Take 1 tablet by mouth every evening.     ELIQUIS  5 MG TABS tablet TAKE 1 TABLET(5 MG) BY MOUTH TWICE DAILY 60 tablet 5   FARXIGA  10 MG TABS tablet Take 10 mg by mouth in the morning.     finasteride  (PROSCAR ) 5 MG tablet Take 1 tablet (5 mg total) by mouth daily. 90 tablet 3   furosemide  (LASIX ) 40 MG tablet Take 1 tablet (40 mg total) by mouth 2 (two) times daily. 180 tablet 3   HYDROcodone -acetaminophen  (NORCO) 10-325 MG tablet Take 1 tablet by mouth every 6 (six) hours as needed for moderate pain.     LANTUS  SOLOSTAR 100 UNIT/ML Solostar Pen Inject 85 Units into the skin daily.     lisinopril  (ZESTRIL ) 20 MG tablet Take 20 mg by mouth daily.     MAGNESIUM  PO Take 1 tablet by mouth every evening.     Menaquinone-7 (VITAMIN K2  PO) Take 1 tablet by mouth every evening.     metoprolol  tartrate (LOPRESSOR ) 25 MG tablet Take 1.5 tablets (37.5 mg total) by mouth 2 (two) times daily.  90 tablet 5   nitroGLYCERIN  (NITROSTAT ) 0.4 MG SL tablet Place 1 tablet (0.4 mg total) under the tongue every 5 (five) minutes x 3 doses as needed for chest pain (if no relief after 2nd dose, proceed to the ED for an evalution or call 911). 75 tablet 2   Nutritional Supplements (GLUCOSAMINE COMPLEX PO) Take 1 tablet by mouth every evening.     pantoprazole  (PROTONIX ) 40 MG tablet TAKE 1 TABLET BY MOUTH ONCE DAILY IN THE EVENING 90 tablet 0   potassium chloride  (KLOR-CON  M) 10 MEQ tablet Take 1 tablet (10 mEq total) by mouth daily. 30 tablet 1   potassium citrate  (UROCIT-K ) 5 MEQ (540 MG) SR tablet TAKE 1 TABLET BY MOUTH THREE TIMES DAILY WITH MEALS 90 tablet 0   rosuvastatin  (CRESTOR ) 20 MG tablet Take 1  tablet (20 mg total) by mouth daily. 30 tablet 0   silodosin  (RAPAFLO ) 8 MG CAPS capsule Take 1 capsule (8 mg total) by mouth daily with breakfast. 90 capsule 3   No current facility-administered medications for this visit.    Allergies   Allergies as of 10/23/2023   (No Known Allergies)      Review of Systems   General: Negative for anorexia, weight loss, fever, chills, fatigue, weakness. Eyes: Negative for vision changes.  ENT: Negative for hoarseness, difficulty swallowing , nasal congestion. CV: Negative for chest pain, angina, palpitations, dyspnea on exertion, peripheral edema.  Respiratory: Negative for dyspnea at rest, dyspnea on exertion, cough, sputum, wheezing.  GI: See history of present illness. GU:  Negative for dysuria, hematuria, urinary incontinence, urinary frequency, nocturnal urination.  MS: Negative for joint pain, low back pain.  Derm: Negative for rash or itching.  Neuro: Negative for weakness, abnormal sensation, seizure, frequent headaches, memory loss,  confusion.  Psych: Negative for anxiety, depression, suicidal ideation, hallucinations.  Endo: Negative for unusual weight change.  Heme: Negative for bruising or bleeding. Allergy: Negative for rash or hives.  Physical Exam   BP (!) 114/56   Pulse (!) 59   Temp 98.5 F (36.9 C)   Ht 5' 9 (1.753 m)   Wt 236 lb 3.2 oz (107.1 kg)   BMI 34.88 kg/m    General: Well-nourished, well-developed in no acute distress.  Head: Normocephalic, atraumatic.   Eyes: Conjunctiva pink, no icterus. Mouth: Oropharyngeal mucosa moist and pink  Neck: Supple without thyromegaly, masses, or lymphadenopathy.  Abdomen: Bowel sounds are normal, nontender, nondistended, no hepatosplenomegaly or masses,  no abdominal bruits or hernia, no rebound or guarding.   Rectal: not performed Extremities: No lower extremity edema. No clubbing or deformities.  Neuro: Alert and oriented x 4 , grossly normal neurologically.  Skin:  Warm and dry, no rash or jaundice.   Psych: Alert and cooperative, normal mood and affect.  Labs   Lab Results  Component Value Date   NA 141 03/12/2023   CL 106 03/12/2023   K 3.8 03/12/2023   CO2 25 03/12/2023   BUN 17 03/12/2023   CREATININE 1.11 03/12/2023   GFRNONAA >60 03/12/2023   CALCIUM  10.0 03/12/2023   PHOS 2.5 (L) 03/04/2022   ALBUMIN 3.6 03/04/2023   GLUCOSE 75 03/12/2023   Lab Results  Component Value Date   ALT 16 03/04/2023   AST 23 03/04/2023   ALKPHOS 32 (L) 03/04/2023   BILITOT 0.9 03/04/2023   Lab Results  Component Value Date   WBC 8.1 03/05/2023   HGB 12.3 (L) 03/05/2023   HCT 39.9 03/05/2023  MCV 91.1 03/05/2023   PLT 191 03/05/2023   Lab Results  Component Value Date   HGBA1C 7.0 (H) 03/05/2023   04/2023: Hgb 15, Hct 47.3, BUN 21, Cre 1.51.  Imaging Studies   No results found.  Assessment/Plan:   Gastroesophageal reflux disease (GERD) He has been on PPI for many years. EGD in 2021 with gastritis, esophagus normal. He used to consume TUMS by the bottles. I suspect he still needs acid suppression but he has never had trial off medication and would like to consider.   - Discuss trial of weaning off pantoprazole  by taking it every other day for a week, then potentially stopping for a week to assess symptoms. - If he has return of any symptoms, he will resume pantoprazole  daily. - Refills of pantoprazole  to [facility]. -call with any questions or concerns -reinforced anti-reflux measures  History of adenomatous colon polyps Multiple colon polyps, in 2020 he had 12 tubular adenomas up to 20mm in size and in 2023 he had 4 tubular adenomas up to 7mm in size. There are concerns about risks of colonoscopy due to age, comorbidities, and anticoagulation therapy. Given his age, he may or may not need future surveillance colonoscopy. His current Hgb is normal. No GI symptoms.  - He will return for office visit in six months with Dr. Shaaron to  discuss possible surveillance colonoscopy, reassessing at that time.    William [name], MHS, PA-C [facility]

## 2023-10-23 ENCOUNTER — Encounter: Payer: Self-pay | Admitting: Gastroenterology

## 2023-10-23 ENCOUNTER — Ambulatory Visit: Admitting: Gastroenterology

## 2023-10-23 VITALS — BP 114/56 | HR 59 | Temp 98.5°F | Ht 69.0 in | Wt 236.2 lb

## 2023-10-23 DIAGNOSIS — K219 Gastro-esophageal reflux disease without esophagitis: Secondary | ICD-10-CM | POA: Diagnosis not present

## 2023-10-23 DIAGNOSIS — Z860100 Personal history of colon polyps, unspecified: Secondary | ICD-10-CM

## 2023-10-23 DIAGNOSIS — Z860101 Personal history of adenomatous and serrated colon polyps: Secondary | ICD-10-CM

## 2023-10-23 MED ORDER — PANTOPRAZOLE SODIUM 40 MG PO TBEC: 40.0000 mg | DELAYED_RELEASE_TABLET | Freq: Every day | ORAL | 3 refills | Status: AC

## 2023-10-23 NOTE — Patient Instructions (Signed)
 Consider trying to wean off pantoprazole  (medication for acid reflux). Start by taking it every other day for one week. If you do well with no recurrent abdominal pain, heartburn, nausea, then you can stop the medication. Monitor for recurrent symptoms. If you start noticing symptoms occurring more than 1-2 times per week, you should go back on pantoprazole  daily. This is really a test to see if you still need to be on the medication. I have sent refills to [facility].  Return to the office in six months to see Dr. Shaaron and discuss possible colonoscopy for history of colon polyps. Given your other medical issues, Dr. Shaaron and you may decide not to pursue colonoscopy however I would like for you to discuss with him further before making a decision.  Call with any questions or concerns.

## 2023-10-26 ENCOUNTER — Other Ambulatory Visit: Payer: Self-pay | Admitting: Student

## 2023-10-29 ENCOUNTER — Other Ambulatory Visit: Payer: Self-pay

## 2023-10-29 DIAGNOSIS — N200 Calculus of kidney: Secondary | ICD-10-CM

## 2023-10-30 ENCOUNTER — Other Ambulatory Visit: Payer: Self-pay

## 2023-10-30 ENCOUNTER — Emergency Department (HOSPITAL_COMMUNITY)
Admission: EM | Admit: 2023-10-30 | Discharge: 2023-10-30 | Disposition: A | Discharge status: Home / Self Care | Attending: Emergency Medicine | Admitting: Emergency Medicine

## 2023-10-30 ENCOUNTER — Encounter (HOSPITAL_COMMUNITY): Payer: Self-pay | Admitting: Emergency Medicine

## 2023-10-30 ENCOUNTER — Other Ambulatory Visit: Payer: Self-pay | Admitting: Student

## 2023-10-30 ENCOUNTER — Ambulatory Visit: Payer: PPO | Admitting: Urology

## 2023-10-30 ENCOUNTER — Encounter: Payer: Self-pay | Admitting: Urology

## 2023-10-30 ENCOUNTER — Ambulatory Visit (HOSPITAL_COMMUNITY)
Admission: RE | Admit: 2023-10-30 | Discharge: 2023-10-30 | Disposition: A | Discharge status: Home / Self Care | Source: Ambulatory Visit | Attending: Urology | Admitting: Urology

## 2023-10-30 VITALS — BP 110/65 | HR 66

## 2023-10-30 DIAGNOSIS — N400 Enlarged prostate without lower urinary tract symptoms: Secondary | ICD-10-CM | POA: Diagnosis not present

## 2023-10-30 DIAGNOSIS — I2510 Atherosclerotic heart disease of native coronary artery without angina pectoris: Secondary | ICD-10-CM | POA: Diagnosis not present

## 2023-10-30 DIAGNOSIS — N179 Acute kidney failure, unspecified: Secondary | ICD-10-CM | POA: Diagnosis not present

## 2023-10-30 DIAGNOSIS — I129 Hypertensive chronic kidney disease with stage 1 through stage 4 chronic kidney disease, or unspecified chronic kidney disease: Secondary | ICD-10-CM | POA: Insufficient documentation

## 2023-10-30 DIAGNOSIS — Z8546 Personal history of malignant neoplasm of prostate: Secondary | ICD-10-CM | POA: Insufficient documentation

## 2023-10-30 DIAGNOSIS — Z79899 Other long term (current) drug therapy: Secondary | ICD-10-CM | POA: Insufficient documentation

## 2023-10-30 DIAGNOSIS — Y732 Prosthetic and other implants, materials and accessory gastroenterology and urology devices associated with adverse incidents: Secondary | ICD-10-CM | POA: Insufficient documentation

## 2023-10-30 DIAGNOSIS — N189 Chronic kidney disease, unspecified: Secondary | ICD-10-CM | POA: Insufficient documentation

## 2023-10-30 DIAGNOSIS — E1122 Type 2 diabetes mellitus with diabetic chronic kidney disease: Secondary | ICD-10-CM | POA: Insufficient documentation

## 2023-10-30 DIAGNOSIS — I480 Paroxysmal atrial fibrillation: Secondary | ICD-10-CM | POA: Insufficient documentation

## 2023-10-30 DIAGNOSIS — R3912 Poor urinary stream: Secondary | ICD-10-CM | POA: Diagnosis not present

## 2023-10-30 DIAGNOSIS — N35912 Unspecified bulbous urethral stricture, male: Secondary | ICD-10-CM | POA: Diagnosis not present

## 2023-10-30 DIAGNOSIS — D72829 Elevated white blood cell count, unspecified: Secondary | ICD-10-CM | POA: Insufficient documentation

## 2023-10-30 DIAGNOSIS — Z7984 Long term (current) use of oral hypoglycemic drugs: Secondary | ICD-10-CM | POA: Insufficient documentation

## 2023-10-30 DIAGNOSIS — Z794 Long term (current) use of insulin: Secondary | ICD-10-CM | POA: Insufficient documentation

## 2023-10-30 DIAGNOSIS — R339 Retention of urine, unspecified: Secondary | ICD-10-CM | POA: Diagnosis present

## 2023-10-30 DIAGNOSIS — Z7901 Long term (current) use of anticoagulants: Secondary | ICD-10-CM | POA: Diagnosis not present

## 2023-10-30 DIAGNOSIS — T83091A Other mechanical complication of indwelling urethral catheter, initial encounter: Secondary | ICD-10-CM | POA: Insufficient documentation

## 2023-10-30 DIAGNOSIS — N200 Calculus of kidney: Secondary | ICD-10-CM

## 2023-10-30 LAB — URINALYSIS, ROUTINE W REFLEX MICROSCOPIC
Bilirubin, UA: NEGATIVE
Ketones, UA: NEGATIVE
Leukocytes,UA: NEGATIVE
Nitrite, UA: NEGATIVE
Protein,UA: NEGATIVE
RBC, UA: NEGATIVE
Specific Gravity, UA: 1.01 (ref 1.005–1.030)
Urobilinogen, Ur: 0.2 mg/dL (ref 0.2–1.0)
pH, UA: 5.5 (ref 5.0–7.5)

## 2023-10-30 LAB — COMPREHENSIVE METABOLIC PANEL WITH GFR
ALT: 18 U/L (ref 0–44)
AST: 25 U/L (ref 15–41)
Albumin: 3.9 g/dL (ref 3.5–5.0)
Alkaline Phosphatase: 42 U/L (ref 38–126)
Anion gap: 10 (ref 5–15)
BUN: 30 mg/dL — ABNORMAL HIGH (ref 8–23)
CO2: 26 mmol/L (ref 22–32)
Calcium: 9.9 mg/dL (ref 8.9–10.3)
Chloride: 102 mmol/L (ref 98–111)
Creatinine, Ser: 1.58 mg/dL — ABNORMAL HIGH (ref 0.61–1.24)
GFR, Estimated: 45 mL/min — ABNORMAL LOW (ref >60)
Glucose, Bld: 157 mg/dL — ABNORMAL HIGH (ref 70–99)
Potassium: 4.2 mmol/L (ref 3.5–5.1)
Sodium: 137 mmol/L (ref 135–145)
Total Bilirubin: 0.5 mg/dL (ref 0.0–1.2)
Total Protein: 6.4 g/dL — ABNORMAL LOW (ref 6.5–8.1)

## 2023-10-30 LAB — CBC WITH DIFFERENTIAL/PLATELET
Abs Immature Granulocytes: 0.06 K/uL (ref 0.00–0.07)
Basophils Absolute: 0 K/uL (ref 0.0–0.1)
Basophils Relative: 0 %
Eosinophils Absolute: 0.4 K/uL (ref 0.0–0.5)
Eosinophils Relative: 3 %
HCT: 43.5 % (ref 39.0–52.0)
Hemoglobin: 14.7 g/dL (ref 13.0–17.0)
Immature Granulocytes: 1 %
Lymphocytes Relative: 14 %
Lymphs Abs: 1.8 K/uL (ref 0.7–4.0)
MCH: 32 pg (ref 26.0–34.0)
MCHC: 33.8 g/dL (ref 30.0–36.0)
MCV: 94.8 fL (ref 80.0–100.0)
Monocytes Absolute: 1.3 K/uL — ABNORMAL HIGH (ref 0.1–1.0)
Monocytes Relative: 10 %
Neutro Abs: 9 K/uL — ABNORMAL HIGH (ref 1.7–7.7)
Neutrophils Relative %: 72 %
Platelets: 140 K/uL — ABNORMAL LOW (ref 150–400)
RBC: 4.59 MIL/uL (ref 4.22–5.81)
RDW: 13.2 % (ref 11.5–15.5)
WBC: 12.6 K/uL — ABNORMAL HIGH (ref 4.0–10.5)
nRBC: 0 % (ref 0.0–0.2)

## 2023-10-30 MED ORDER — ALFUZOSIN HCL ER 10 MG PO TB24: 10.0000 mg | ORAL_TABLET | Freq: Every day | ORAL | 3 refills | Status: AC

## 2023-10-30 MED ORDER — POTASSIUM CITRATE ER 5 MEQ (540 MG) PO TBCR: 5.0000 meq | EXTENDED_RELEASE_TABLET | Freq: Three times a day (TID) | ORAL | 11 refills | Status: AC

## 2023-10-30 MED ORDER — TAMSULOSIN HCL 0.4 MG PO CAPS: 0.4000 mg | ORAL_CAPSULE | Freq: Every day | ORAL | 3 refills | Status: AC

## 2023-10-30 MED ORDER — CIPROFLOXACIN HCL 500 MG PO TABS
500.0000 mg | ORAL_TABLET | Freq: Once | ORAL | Status: AC
  Administered 2023-10-30: 500 mg via ORAL

## 2023-10-30 NOTE — ED Notes (Signed)
 Pt/family received d/c paperwork at this time. After going over the paperwork any questions, comments, or concerns were answered to the best of this nurse's knowledge. The pt/family verbally acknowledged the teachings/instructions.   Pt's urinary bag was empty. There was ~816mL in the bag

## 2023-10-30 NOTE — Progress Notes (Signed)
 10/30/2023 9:39 AM   William [name] 05-25-1945 [id number]  Referring provider: Bertell Satterfield, MD [address] [place],  [place] [address]  Followup nephrolithiasis and BPH   HPI: Mr Raine is a 78yo here for followup for BPH and nephrolithiasis. No stone events since last visit. KUb from today shows no renal or ureteral calculi. He is on UrocitK 15meq daily. IPSS 17 QOL 3 on uroxatral  10mg  in AM and flomax  0.4mg  PM.  He is also on finasteride  5mg  daily. Urine stream fair. No straining to urinate. Nocturia 2x depending on fluid consumption. Hew has a hx of radiation therapy for prostate cancer   PMH: Past Medical History:  Diagnosis Date   Anxiety    Aortic stenosis    Arthritis    CAD (coronary artery disease)    a. s/p DES to distal RCA in 08/2013, DES to LAD 07/2021   Cancer [facility])    prostate   CKD (chronic kidney disease)    Diabetes mellitus without complication (HCC)    Family history of colon cancer    GERD (gastroesophageal reflux disease)    Heart murmur    Hypercalcemia    Hypercholesteremia    Hypertension    Kidney stone    PAF (paroxysmal atrial fibrillation) (HCC)    Personal history of colonic polyps    Pneumonia    S/P TAVR (transcatheter aortic valve replacement) 09/17/2021   s/p TAVR with a 23 mm Edwards S3UR via the TF approach by Dr. Verlin & Bartle    Surgical History: Past Surgical History:  Procedure Laterality Date   APPENDECTOMY     BIOPSY  11/03/2019   Benign gastric mucosa with reactive changes and focal inflammation   BIOPSY  03/11/2021   Procedure: BIOPSY;  Surgeon: Cindie Carlin POUR, DO;  [facility];  Service: Endoscopy;;   COLONOSCOPY WITH PROPOFOL  N/A 02/15/2018   12 polyps ranging in 5 to 20 mm in size were tubular adenoma and recommended repeat exam in 2023   COLONOSCOPY WITH PROPOFOL  N/A 03/11/2021   Procedure: COLONOSCOPY WITH PROPOFOL ;  Surgeon: Cindie Carlin POUR, DO;  [facility];  Service:  Endoscopy;  Laterality: N/A;  9:15am   CORONARY STENT INTERVENTION N/A 07/15/2021   Procedure: CORONARY STENT INTERVENTION;  Surgeon: Verlin Lonni BIRCH, MD;  Location: [facility] INVASIVE CV LAB;  Service: Cardiovascular;  Laterality: N/A;   CORONARY STENT PLACEMENT  08/10/2013   ESOPHAGOGASTRODUODENOSCOPY (EGD) WITH PROPOFOL  N/A 11/03/2019   normal esophagus, small hiatal hernia, diffuse erythematous mucosa in the entire stomach with scattered erosions.  Status post gastric biopsies for histology.  Surgical pathology found the biopsies to be benign gastric mucosa with reactive changes and focal inflammation, negative for H. Pylori.   FLEXIBLE SIGMOIDOSCOPY N/A 11/03/2019   attempted colonoscopy but inadequate prep   gsw to abd     INTRAOPERATIVE TRANSTHORACIC ECHOCARDIOGRAM N/A 09/17/2021   Procedure: INTRAOPERATIVE TRANSTHORACIC ECHOCARDIOGRAM;  Surgeon: Verlin Lonni BIRCH, MD;  [facility];  Service: Open Heart Surgery;  Laterality: N/A;   LEFT HEART CATHETERIZATION WITH CORONARY ANGIOGRAM N/A 08/10/2013   Procedure: LEFT HEART CATHETERIZATION WITH CORONARY ANGIOGRAM;  Surgeon: Deatrice DELENA Cage, MD;  Location: [facility] CATH LAB;  Service: Cardiovascular;  Laterality: N/A;   MULTIPLE EXTRACTIONS WITH ALVEOLOPLASTY N/A 08/22/2021   Procedure: MULTIPLE EXTRACTION WITH ALVEOLOPLASTY;  Surgeon: Celena Lum NOVAK, [name];  Location: [facility] OR;  Service: Dentistry;  Laterality: N/A;   PERIPHERAL INTRAVASCULAR LITHOTRIPSY  07/15/2021   Procedure:  INTRAVASCULAR LITHOTRIPSY;  Surgeon: Verlin Lonni BIRCH, MD;  Location: [facility] INVASIVE CV LAB;  Service: Cardiovascular;;   POLYPECTOMY  02/15/2018   Procedure: POLYPECTOMY;  Surgeon: Shaaron Lamar HERO, MD;  [facility];  Service: Endoscopy;;  colon   POLYPECTOMY  03/11/2021   Procedure: POLYPECTOMY;  Surgeon: Cindie Carlin POUR, DO;  [facility];  Service: Endoscopy;;   RIGHT/LEFT HEART CATH AND CORONARY ANGIOGRAPHY N/A 07/15/2021    Procedure: RIGHT/LEFT HEART CATH AND CORONARY ANGIOGRAPHY;  Surgeon: Verlin Lonni BIRCH, MD;  [facility];  Service: Cardiovascular;  Laterality: N/A;   TRANSCATHETER AORTIC VALVE REPLACEMENT, TRANSFEMORAL N/A 09/17/2021   Procedure: Transcatheter Aortic Valve Replacement, Transfemoral;  Surgeon: Verlin Lonni BIRCH, MD;  [facility];  Service: Open Heart Surgery;  Laterality: N/A;    Home Medications:  Allergies as of 10/30/2023   No Known Allergies      Medication List        Accurate as of October 30, 2023  9:39 AM. If you have any questions, ask your nurse or doctor.          acarbose  100 MG tablet Commonly known as: PRECOSE  Take 100 mg by mouth 3 (three) times daily.   alendronate  70 MG tablet Commonly known as: FOSAMAX  Take 1 tablet (70 mg total) by mouth every 7 (seven) days. Take with a full glass of water  on an empty stomach.   amLODipine  5 MG tablet Commonly known as: NORVASC  TAKE 1 TABLET(5 MG) BY MOUTH DAILY   COQ10 PO Take 1 capsule by mouth every evening.   Eliquis  5 MG Tabs tablet Generic drug: apixaban  TAKE 1 TABLET(5 MG) BY MOUTH TWICE DAILY   Farxiga  10 MG Tabs tablet Generic drug: dapagliflozin  propanediol Take 10 mg by mouth in the morning.   finasteride  5 MG tablet Commonly known as: PROSCAR  Take 1 tablet (5 mg total) by mouth daily.   furosemide  40 MG tablet Commonly known as: Lasix  Take 1 tablet (40 mg total) by mouth 2 (two) times daily.   GLUCOSAMINE COMPLEX PO Take 1 tablet by mouth every evening.   HYDROcodone -acetaminophen  10-325 MG tablet Commonly known as: NORCO Take 1 tablet by mouth every 6 (six) hours as needed for moderate pain.   Lantus  SoloStar 100 UNIT/ML Solostar Pen Generic drug: insulin  glargine Inject 85 Units into the skin daily.   lisinopril  20 MG tablet Commonly known as: ZESTRIL  Take 20 mg by mouth daily.   MAGNESIUM  PO Take 1 tablet by mouth every evening.    metoprolol  tartrate 25 MG tablet Commonly known as: LOPRESSOR  Take 1.5 tablets (37.5 mg total) by mouth 2 (two) times daily.   nitroGLYCERIN  0.4 MG SL tablet Commonly known as: NITROSTAT  Place 1 tablet (0.4 mg total) under the tongue every 5 (five) minutes x 3 doses as needed for chest pain (if no relief after 2nd dose, proceed to the ED for an evalution or call 911).   pantoprazole  40 MG tablet Commonly known as: PROTONIX  Take 1 tablet (40 mg total) by mouth daily.   potassium chloride  10 MEQ tablet Commonly known as: KLOR-CON  M Take 1 tablet (10 mEq total) by mouth daily.   potassium citrate  5 MEQ (540 MG) SR tablet Commonly known as: UROCIT-K  TAKE 1 TABLET BY MOUTH THREE TIMES DAILY WITH MEALS   rosuvastatin  20 MG tablet Commonly known as: CRESTOR  Take 1 tablet (20 mg total) by mouth daily.   silodosin  8 MG Caps capsule Commonly known as:  RAPAFLO  Take 1 capsule (8 mg total) by mouth daily with breakfast.   VITAMIN B-12 PO Take 1 tablet by mouth every evening.   VITAMIN D -3 PO Take 1 tablet by mouth every evening.   VITAMIN K2  PO Take 1 tablet by mouth every evening.        Allergies: No Known Allergies  Family History: Family History  Problem Relation Age of Onset   Diabetes Mother    Heart attack Mother 72   Pulmonary embolism Father    Colon cancer Brother 27       Passed age 68 from colon ca   Gastric cancer Neg Hx    Esophageal cancer Neg Hx     Social History:  reports that he quit smoking about 19 years ago. His smoking use included cigarettes. He started smoking about 61 years ago. He has a 135 pack-year smoking history. He quit smokeless tobacco use about 18 years ago. He reports that he does not currently use alcohol. He reports that he does not use drugs.  ROS: All other review of systems were reviewed and are negative except what is noted above in HPI  Physical Exam: There were no vitals taken for this visit.  Constitutional:  Alert and  oriented, No acute distress. HEENT: [place] AT, moist mucus membranes.  Trachea midline, no masses. Cardiovascular: No clubbing, cyanosis, or edema. Respiratory: Normal respiratory effort, no increased work of breathing. GI: Abdomen is soft, nontender, nondistended, no abdominal masses GU: No CVA tenderness.  Lymph: No cervical or inguinal lymphadenopathy. Skin: No rashes, bruises or suspicious lesions. Neurologic: Grossly intact, no focal deficits, moving all 4 extremities. Psychiatric: Normal mood and affect.  Laboratory Data: Lab Results  Component Value Date   WBC 8.1 03/05/2023   HGB 12.3 (L) 03/05/2023   HCT 39.9 03/05/2023   MCV 91.1 03/05/2023   PLT 191 03/05/2023    Lab Results  Component Value Date   CREATININE 1.11 03/12/2023    Lab Results  Component Value Date   PSA 0.6 05/21/2016    No results found for: TESTOSTERONE  Lab Results  Component Value Date   HGBA1C 7.0 (H) 03/05/2023    Urinalysis    Component Value Date/Time   COLORURINE STRAW (A) 03/17/2022 1516   APPEARANCEUR Clear 10/29/2022 1009   LABSPEC 1.010 03/17/2022 1516   PHURINE 5.0 03/17/2022 1516   GLUCOSEU 3+ (A) 10/29/2022 1009   HGBUR NEGATIVE 03/17/2022 1516   BILIRUBINUR Negative 10/29/2022 1009   KETONESUR NEGATIVE 03/17/2022 1516   PROTEINUR Negative 10/29/2022 1009   PROTEINUR NEGATIVE 03/17/2022 1516   UROBILINOGEN 0.2 10/21/2014 2350   NITRITE Negative 10/29/2022 1009   NITRITE NEGATIVE 03/17/2022 1516   LEUKOCYTESUR Negative 10/29/2022 1009   LEUKOCYTESUR NEGATIVE 03/17/2022 1516    Lab Results  Component Value Date   LABMICR Comment 10/29/2022   WBCUA 0-5 04/28/2022   LABEPIT 0-10 04/28/2022   MUCUS Present 04/29/2021   BACTERIA None seen 04/28/2022    Pertinent Imaging: KUb today: Images reviewed and discussed with the patient  Results for orders placed during the hospital encounter of 04/28/22  Abdomen 1 view (KUB)  Narrative CLINICAL DATA:  Nephrolithiasis,  mid to lower back pain  EXAM: ABDOMEN - 1 VIEW  COMPARISON:  10/29/2021  FINDINGS: Suture material projects over RIGHT upper quadrant.  Clothing artifact projects over RIGHT upper quadrant.  No definite urinary tract calcification.  Small pelvic phleboliths stable.  Nonobstructive bowel gas pattern.  Bones demineralized.  IMPRESSION: No definite  urinary tract calcification.   Electronically Signed By: Oneil Kiss M.D. On: 04/28/2022 09:16  Results for orders placed during the hospital encounter of 10/21/21  US  Venous Img Lower Bilateral (DVT)  Narrative CLINICAL DATA:  Pain and swelling  EXAM: Bilateral lower Extremity Venous Doppler Ultrasound  TECHNIQUE: Gray-scale sonography with compression, as well as color and duplex ultrasound, were performed to evaluate the deep venous system(s) from the level of the common femoral vein through the popliteal and proximal calf veins.  COMPARISON:  None available  FINDINGS: VENOUS  Normal compressibility of the common femoral, superficial femoral, and popliteal veins, as well as the visualized calf veins. Visualized portions of profunda femoral vein and great saphenous vein unremarkable. No filling defects to suggest DVT on grayscale or color Doppler imaging. Doppler waveforms show normal direction of venous flow, normal respiratory plasticity and response to augmentation.  OTHER  Hypoechoic rounded structure noted in the left inguinal region measuring 2.2 x 1.6 cm. There is questionable minimal flow within this structure.  Limitations: none  IMPRESSION: 1. No lower extremity DVT. 2. Hypoechoic rounded structure noted in the left inguinal region, adjacent to the proximal superficial femoral artery, measuring 2.2 x 1.6 cm. There is questionable minimal flow within this structure. Differential diagnosis includes nearly completely thrombosed pseudoaneurysm, hematoma, or pathologically enlarged lymph  node. Further evaluation with CTA should be considered.  These results will be called to the ordering clinician or representative by the Radiologist Assistant, and communication documented in the PACS or [employer].   Electronically Signed By: Aliene Lloyd M.D. On: 10/21/2021 13:50  No results found for this or any previous visit.  No results found for this or any previous visit.  Results for orders placed during the hospital encounter of 04/11/19  US  RENAL  Narrative CLINICAL DATA:  Chronic kidney disease, stage IIIB.  EXAM: RENAL / URINARY TRACT ULTRASOUND COMPLETE  COMPARISON:  CT scan of the abdomen and pelvis dated 06/08/2016  FINDINGS: Right Kidney:  Renal measurements: 13 x 7.6 x 7.1 cm = volume: 366 mL . Echogenicity within normal limits. No mass or hydronephrosis visualized.  Left Kidney:  Renal measurements: 13.3 x 6.7 x 5.4 cm = volume: 247 mL. Echogenicity within normal limits. No mass or hydronephrosis visualized. 11 mm stone in the lower pole of the left kidney.  Bladder:  Appears normal for degree of bladder distention. Bilateral ureteral jets identified.  Other:  None.  IMPRESSION: Stone in the lower pole of the left kidney.  Otherwise normal exam.   Electronically Signed By: Lynwood Hugger M.D. On: 04/11/2019 15:47  No results found for this or any previous visit.  No results found for this or any previous visit.  Results for orders placed during the hospital encounter of 12/07/13  CT RENAL STONE STUDY  Narrative CLINICAL DATA:  Acute onset of right flank pain.  Initial encounter.  EXAM: CT ABDOMEN AND PELVIS WITHOUT CONTRAST  TECHNIQUE: Multidetector CT imaging of the abdomen and pelvis was performed following the standard protocol without IV contrast.  COMPARISON:  None.  FINDINGS: The visualized lung bases are clear. Diffuse coronary artery calcifications are seen.  The liver and spleen are unremarkable in  appearance. The gallbladder is within normal limits. The pancreas and adrenal glands are unremarkable.  Minimal right-sided hydronephrosis is noted, with two obstructing stones noted just below the right renal pelvis, in the proximal right ureter. These measure 6 x 5 mm more proximally, and 4 x 3 mm more distally.  A few nonobstructing stones are noted at the lower pole of the left kidney, with mild associated scarring. These measure up to 5 mm in size. Mild perinephric stranding is noted bilaterally, more prominent on the right.  No free fluid is identified. The small bowel is unremarkable in appearance. The stomach is within normal limits. No acute vascular abnormalities are seen.  The patient is status post appendectomy. Scattered diverticulosis is noted along the entirety of the colon, without evidence of diverticulitis. Note is made of a small anterior abdominal wall hernia just to the left of midline, containing a short segment of transverse colon. There is no evidence of obstruction. The colon is otherwise unremarkable.  Postoperative change is noted along the anterior midline abdomen. A tiny umbilical hernia is also seen, containing only fat.  The bladder is mildly distended and grossly unremarkable. The prostate is mildly enlarged, measuring 5.2 cm in transverse dimension, with minimal calcification. No inguinal lymphadenopathy is seen.  No acute osseous abnormalities are identified.  IMPRESSION: 1. Minimal right-sided hydronephrosis, with two obstructing stones noted just below the right renal pelvis, in the proximal right ureter. These measure 6 x 5 mm more proximally, and 4 x 3 mm more distally. 2. Few nonobstructing stones at the lower pole of the left kidney, with mild associated scarring. 3. Diffuse coronary artery calcifications seen. 4. Scattered diverticulosis along the entirety of the colon, without evidence of diverticulitis. 5. Short segment of  transverse colon herniating into a small anterior abdominal wall hernia just to the left of midline, without evidence of obstruction. 6. Tiny umbilical hernia, containing only fat. 7. Mildly enlarged prostate noted.   Electronically Signed By: Juliane Chihuahua M.D. On: 12/07/2013 06:05     11/03/23  CC: No chief complaint on file.   HPI:  Blood pressure 110/65, pulse 66. NED. A&Ox3.   No respiratory distress   Abd soft, NT, ND Normal phallus with bilateral descended testicles  Cystoscopy Procedure Note  Patient identification was confirmed, informed consent was obtained, and patient was prepped using Betadine  solution.  Lidocaine  jelly was administered per urethral meatus.     Pre-Procedure: - Inspection reveals a normal caliber ureteral meatus.  Procedure: The flexible cystoscope was introduced without difficulty - urethral stricture noted at bulbar urethra. A guidewire was advanced into the bladder and then we sequentially dilated to stricture from 8 frecnh to 20 french. - Enlarged prostate  - Normal bladder neck - Bilateral ureteral orifices identified - Bladder mucosa  reveals no ulcers, tumors, or lesions - No bladder stones - No trabeculation   16 french foley placed   Post-Procedure: - Patient tolerated the procedure well   Assessment & Plan:    1. Nephrolithiasis (Primary) Followup 1 year with KUB - Urinalysis, Routine w reflex microscopic  2. Benign prostatic hyperplasia, unspecified whether lower urinary tract symptoms present Uroxatral  10mg  qhs  3. Weak urinary stream Uroxatral  10mg  at bedtime  4. Urethral stricture -followup 1 week for a voiding trial   No follow-ups on file.  Belvie Clara, MD  [facility] [place]

## 2023-10-30 NOTE — Discharge Instructions (Addendum)
 Thank you for coming to [facility] Emergency Department. You were seen for a blocked Foley bag which was flushed by the nurse and started functioning again.  Your symptoms improved.  Your labs showed a mild increase in your creatinine, or an acute kidney injury.  Please have this level rechecked with your urologist on Tuesday.  Please follow with Dr. Sherrilee on Tuesday as originally scheduled.  Please continue to take your medications as prescribed.  Do not hesitate to return to the ED or call 911 if you experience: -Worsening symptoms -Foley bag not draining -Abdominal or flank pain -Nausea vomiting so severe you cannot eat, drink, or take your medications -Lightheadedness, passing out -Fevers/chills -Anything else that concerns you

## 2023-10-30 NOTE — ED Triage Notes (Signed)
 Pt arrived to ED from home c/o foley catheter not draining. Pt was seen by urology this morning and had foley placed. Pt states he drained the bag at about 3pm this afternoon but has had no output since.

## 2023-10-30 NOTE — ED Provider Notes (Signed)
 [name] [facility] AT [facility] Provider Note   CSN: [id number] Arrival date & time: 10/30/23  1905     History  Chief Complaint  Patient presents with   Urinary Retention    William [name] is a 78 y.o. male with BPH, nephrolithiasis, CAD, aortic stenosis, prostate cancer s/p TAVR and radiation therapy, CKD, GERD, T2DM, PAF on eliquis , PMH as listed below who presents with granddaughter who provides additional history.  Patient had a cystoscopy with his urologist this morning for history of prostate cancer status post TAVR.  He states that Foley was placed afterwards and he emptied the bag at approximately 2:30 PM this afternoon. Noted that it was bloody. Since 2:30 PM he has not had any drainage into the Foley bag so he presented to the ED.  Feels some abdominal discomfort but no back pain.  No fever/chills.  Bladder scan from triage notes 500 cc in the Foley bag.  Is supposed to Follow-up with urology on Tuesday to have the Foley removed.  Also had KUB today that was noted to show no renal or ureteral calculi.  He does take Eliquis  twice a day for paroxysmal A-fib.  Urinalysis from just today at the urologist office at 10 AM did not demonstrate any signs of infection.   Past Medical History:  Diagnosis Date   Anxiety    Aortic stenosis    Arthritis    CAD (coronary artery disease)    a. s/p DES to distal RCA in 08/2013, DES to LAD 07/2021   Cancer [facility])    prostate   CKD (chronic kidney disease)    Diabetes mellitus without complication (HCC)    Family history of colon cancer    GERD (gastroesophageal reflux disease)    Heart murmur    Hypercalcemia    Hypercholesteremia    Hypertension    Kidney stone    PAF (paroxysmal atrial fibrillation) (HCC)    Personal history of colonic polyps    Pneumonia    S/P TAVR (transcatheter aortic valve replacement) 09/17/2021   s/p TAVR with a 23 mm Edwards S3UR via the TF approach by Dr. Verlin & [name]        Home Medications Prior to Admission medications   Medication Sig Start Date End Date Taking? Authorizing Provider  acarbose  (PRECOSE ) 100 MG tablet Take 100 mg by mouth 3 (three) times daily.    [provider]  alendronate  (FOSAMAX ) 70 MG tablet Take 1 tablet (70 mg total) by mouth every 7 (seven) days. Take with a full glass of water  on an empty stomach. 04/15/21   Nida, Gebreselassie W, MD  alfuzosin  (UROXATRAL ) 10 MG 24 hr tablet Take 1 tablet (10 mg total) by mouth daily. 10/30/23   [name], William CROME, MD  amLODipine  (NORVASC ) 5 MG tablet TAKE 1 TABLET(5 MG) BY MOUTH DAILY 11/30/17   Charls Pearla LABOR, MD  Cholecalciferol  (VITAMIN D -3 PO) Take 1 tablet by mouth every evening.    [provider]  Coenzyme Q10 (COQ10 PO) Take 1 capsule by mouth every evening.    [provider]  Cyanocobalamin  (VITAMIN B-12 PO) Take 1 tablet by mouth every evening.    [provider]  ELIQUIS  5 MG TABS tablet TAKE 1 TABLET(5 MG) BY MOUTH TWICE DAILY 09/16/23   [name], William C, MD  FARXIGA  10 MG TABS tablet Take 10 mg by mouth in the morning. 12/01/17   [provider]  finasteride  (PROSCAR ) 5 MG tablet Take  1 tablet (5 mg total) by mouth daily. Patient not taking: Reported on 10/30/2023 10/29/22   Sherrilee William CROME, MD  furosemide  (LASIX ) 40 MG tablet Take 1 tablet (40 mg total) by mouth 2 (two) times daily. 03/12/23   [name], William C, MD  glimepiride (AMARYL) 4 MG tablet Take 8 mg by mouth. 08/24/23   [provider]  glipiZIDE  (GLUCOTROL ) 10 MG tablet Take 10 mg by mouth 2 (two) times daily. 08/06/23   [provider]  HYDROcodone -acetaminophen  (NORCO) 10-325 MG tablet Take 1 tablet by mouth every 6 (six) hours as needed for moderate pain.    [provider]  LANTUS  SOLOSTAR 100 UNIT/ML Solostar Pen Inject 85 Units into the skin daily. 02/19/23   [provider]  lisinopril  (ZESTRIL ) 20 MG tablet Take 20 mg by mouth daily.  12/03/22   [provider]  MAGNESIUM  PO Take 1 tablet by mouth every evening.    [provider]  Menaquinone-7 (VITAMIN K2  PO) Take 1 tablet by mouth every evening.    [provider]  metFORMIN  (GLUCOPHAGE -XR) 500 MG 24 hr tablet Take 500 mg by mouth daily. 09/28/23   [provider]  metoprolol  tartrate (LOPRESSOR ) 25 MG tablet Take 1.5 tablets (37.5 mg total) by mouth 2 (two) times daily. 04/22/23   [name], William C, MD  nitroGLYCERIN  (NITROSTAT ) 0.4 MG SL tablet Place 1 tablet (0.4 mg total) under the tongue every 5 (five) minutes x 3 doses as needed for chest pain (if no relief after 2nd dose, proceed to the ED for an evalution or call 911). 07/02/20   William [name]., NP  Nutritional Supplements (GLUCOSAMINE COMPLEX PO) Take 1 tablet by mouth every evening.    [provider]  pantoprazole  (PROTONIX ) 40 MG tablet Take 1 tablet (40 mg total) by mouth daily. 10/23/23   William Sonny RAMAN, PA-C  potassium chloride  (KLOR-CON  M) 10 MEQ tablet Take 1 tablet (10 mEq total) by mouth daily. 01/08/23   [name], William L, MD  potassium citrate  (UROCIT-K ) 5 MEQ (540 MG) SR tablet Take 1 tablet (5 mEq total) by mouth 3 (three) times daily with meals. 10/30/23   [name], William CROME, MD  rosuvastatin  (CRESTOR ) 20 MG tablet Take 1 tablet (20 mg total) by mouth daily. 08/06/22 10/23/23  [name], William D, DO  silodosin  (RAPAFLO ) 8 MG CAPS capsule Take 1 capsule (8 mg total) by mouth daily with breakfast. Patient not taking: Reported on 10/30/2023 10/29/22   Sherrilee William CROME, MD  tamsulosin  (FLOMAX ) 0.4 MG CAPS capsule Take 1 capsule (0.4 mg total) by mouth daily. 10/30/23   [name], William CROME, MD      Allergies    Patient has no known allergies.    Review of Systems   Review of Systems A 10 point review of systems was performed and is negative unless otherwise reported in HPI.  Physical Exam Updated Vital Signs BP (!) 143/72 (BP Location: Right Arm)   Pulse  71   Temp 97.9 F (36.6 C) (Oral)   Resp 15   Ht 5' 9 (1.753 m)   Wt 107.1 kg   SpO2 96%   BMI 34.88 kg/m  Physical Exam General: Normal appearing elderly male, lying in bed.  HEENT: PERRLA, Sclera anicteric, MMM, trachea midline.  Cardiology: RRR Resp: Normal respiratory rate and effort Abd: Obese abdomen.  Soft, non-tender, non-distended. No rebound tenderness or guarding.  MSK: No peripheral edema or signs of trauma.  Skin: warm, dry.  Back:  No CVA tenderness Neuro: A&Ox4, CNs II-XII grossly intact. MAEs. Sensation grossly intact.  Psych: Normal mood and affect.   ED Results / Procedures / Treatments   Labs (all labs ordered are listed, but only abnormal results are displayed) Labs Reviewed  CBC WITH DIFFERENTIAL/PLATELET - Abnormal; Notable for the following components:      Result Value   WBC 12.6 (*)    Platelets 140 (*)    Neutro Abs 9.0 (*)    Monocytes Absolute 1.3 (*)    All other components within normal limits  COMPREHENSIVE METABOLIC PANEL WITH GFR - Abnormal; Notable for the following components:   Glucose, Bld 157 (*)    BUN 30 (*)    Creatinine, Ser 1.58 (*)    Total Protein 6.4 (*)    GFR, Estimated 45 (*)    All other components within normal limits    EKG None  Radiology No results found.  Procedures Procedures    Medications Ordered in ED Medications - No data to display  ED Course/ Medical Decision Making/ A&P                          Medical Decision Making Amount and/or Complexity of Data Reviewed Labs: ordered. Decision-making details documented in ED Course.    This patient presents to the ED for concern of Foley obstruction, this involves an extensive number of treatment options, and is a complaint that carries with it a high risk of complications and morbidity.  I considered the following differential and admission for this acute, potentially life threatening condition.  Patient is well-appearing  MDM:    Patient presents  with Foley obstruction likely due to a blood clot.  RN was able to flush the foley catheter and clear it of the blood clots/obstruction and the foley began functioning again.  It immediately drained approximately 500 cc which is what was noted in the bladder scan in triage.  Patient this morning just had a urinalysis that did not show any infection.  I suspect likely the bleeding could be due to to the cystoscopy he had earlier today as well as his anticoagulation on the Eliquis .  Patient is much more comfortable now that the Foley is draining again.  BMP does demonstrate a mild AKI.  Possible that this was due to the episode of urinary retention he just had.  He feels well at this time.  Does have a mild leukocytosis but had no UTI on his UA just this morning.  No concern for sepsis or pyelonephritis with no flank tenderness.  Patient is otherwise very well-appearing and hemodynamically stable.  He can follow-up on Tuesday as originally scheduled with Dr. Sherrilee and have repeat labs done at that time.  Patient made aware..  Given discharge instructions and return precautions, all questions answered to patient satisfaction.  Clinical Course as of 11/02/23 1011  Fri Oct 30, 2023  2150 Creatinine(!): 1.58 +AKI. Patient has no flank pain or abdominal pain now that he has foley functioning again. Lower c/f urolithiasis. He has mild leukocytosis but no fever/chills and had no UTI noted on UA just this AM. Leukocytosis could be from procedure as well. Will DC w/ close o/p f/u and strict return precautions. Discussed this all with the patient and his granddaughter.  [HN]    Clinical Course User Index [HN] Franklyn Sid SAILOR, MD    Labs: I Ordered, and personally interpreted labs.  The pertinent results include:  CBC, BMP  Additional history obtained from chart review, granddaughter at bedside  Reevaluation: After the interventions noted above, I reevaluated the patient and found that they have  :improved  Social Determinants of Health: Lives independently  Disposition:  DC w/ discharge instructions/return precautions. All questions answered to patient's satisfaction.    Co morbidities that complicate the patient evaluation  Past Medical History:  Diagnosis Date   Anxiety    Aortic stenosis    Arthritis    CAD (coronary artery disease)    a. s/p DES to distal RCA in 08/2013, DES to LAD 07/2021   Cancer [facility])    prostate   CKD (chronic kidney disease)    Diabetes mellitus without complication (HCC)    Family history of colon cancer    GERD (gastroesophageal reflux disease)    Heart murmur    Hypercalcemia    Hypercholesteremia    Hypertension    Kidney stone    PAF (paroxysmal atrial fibrillation) (HCC)    Personal history of colonic polyps    Pneumonia    S/P TAVR (transcatheter aortic valve replacement) 09/17/2021   s/p TAVR with a 23 mm Edwards S3UR via the TF approach by Dr. Verlin & [name]     Medicines No orders of the defined types were placed in this encounter.   I have reviewed the patients home medicines and have made adjustments as needed  Problem List / ED Course: Problem List Items Addressed This Visit       Genitourinary   AKI (acute kidney injury)   Other Visit Diagnoses       Obstruction of Foley catheter, initial encounter    -  Primary                   This note was created using dictation software, which may contain spelling or grammatical errors.    Franklyn Sid SAILOR, MD 11/02/23 1012

## 2023-10-30 NOTE — Patient Instructions (Signed)

## 2023-10-30 NOTE — ED Notes (Addendum)
 This nurse attempts to irrigate the bladder with sterile water . This nursed about of sterile water  to clear out the possible blood clots. One large and a couple of small clots came out. Pt's urinary bag was replaced with a new one. Pt's output was around ( with irrigation) w/in the first 5 mins after irrigation.

## 2023-11-02 NOTE — Telephone Encounter (Signed)
 Please contact pt for future appointment. Pt due for follow up.

## 2023-11-03 ENCOUNTER — Ambulatory Visit

## 2023-11-03 ENCOUNTER — Encounter: Payer: Self-pay | Admitting: Urology

## 2023-11-03 ENCOUNTER — Encounter: Payer: Self-pay | Admitting: Cardiovascular Disease

## 2023-11-03 DIAGNOSIS — N200 Calculus of kidney: Secondary | ICD-10-CM | POA: Diagnosis not present

## 2023-11-03 MED ORDER — CIPROFLOXACIN HCL 500 MG PO TABS
500.0000 mg | ORAL_TABLET | Freq: Once | ORAL | Status: AC
  Administered 2023-11-03: 500 mg via ORAL

## 2023-11-03 NOTE — Progress Notes (Addendum)
 Fill and Pull Catheter Removal  Patient is present today for a catheter removal due to Nephrolithiasis .  300 ml of sterile water  was instilled into the bladder when the patient felt the urge to urinate. 10 ml of water  was then drained from the balloon.  A 16 FR foley cath was removed from the bladder no complications were noted .  Foley catheter intact and time of removal. Patient as then given some time to void on their own.  Patient can void  200 ml on their own after some time.  Patient tolerated well.  One oral prophylactic antibiotic given per MD orders  Performed by: Carlos, CMA  Follow up/ Additional notes: 3-4 months with PVR

## 2023-11-04 ENCOUNTER — Other Ambulatory Visit: Payer: Self-pay | Admitting: Student

## 2023-11-04 DIAGNOSIS — I10 Essential (primary) hypertension: Secondary | ICD-10-CM

## 2023-11-04 DIAGNOSIS — I480 Paroxysmal atrial fibrillation: Secondary | ICD-10-CM

## 2023-11-05 MED ORDER — METOPROLOL TARTRATE 25 MG PO TABS: 37.5000 mg | ORAL_TABLET | Freq: Two times a day (BID) | ORAL | 1 refills | Status: AC

## 2023-11-14 ENCOUNTER — Other Ambulatory Visit: Payer: Self-pay | Admitting: Urology

## 2023-11-14 DIAGNOSIS — N400 Enlarged prostate without lower urinary tract symptoms: Secondary | ICD-10-CM

## 2023-12-13 ENCOUNTER — Other Ambulatory Visit: Payer: Self-pay | Admitting: Urology

## 2023-12-13 DIAGNOSIS — N400 Enlarged prostate without lower urinary tract symptoms: Secondary | ICD-10-CM

## 2023-12-30 NOTE — Progress Notes (Signed)
 "  Cardiology Office Note    Date:  01/11/2024  ID:  William [name], William [name] 03-12-1945, MRN [id number] Cardiologist: Maude Emmer, MD    History of Present Illness:    William [name] is a 78 y.o. male with past medical history of CAD (s/p DES to distal RCA in 08/2013, PCI/DES to LAD in 07/2021), aortic stenosis (s/p TAVR in 09/2021), paroxysmal atrial fibrillation, HTN, HLD and Type 2 DM who presents to the office today for hospital follow-up.   Admitted to [facility] from 7/29 - 08/05/2022 for evaluation of significant fatigue and tachycardia which had occurred while at cardiac rehab and was consistent with atrial fibrillation with RVR. He converted back to NSR prior to ER evaluation and was monitored overnight and maintained normal sinus rhythm. He had been on Lopressor  25 mg twice daily and this was titrated to 37.5 mg twice daily. It was recommended if he had issues with low heart rates over time, would refer to EP. Troponin values did peak at 180 which was felt to be secondary to demand ischemia. Repeat echocardiogram showed a normal EF of 55 to 60% with moderate LVH, normal RV function, mild MR and his TAVR valve was functioning normally with a mean gradient of 16 mmHg.  He has had no recurrence of PAF. No angina   Admitted 01/05/23 with dyspnea No angina. R/O CXR with cephalization He was in sinus with no recurrent PAF. COVID negative Improved with iv lasix . His lasix  had been d/c earlier in year due to his CRF D/c CR better 1.07 with K 3.9 D/c on lasix  40 mg daily Baseline weight at home 225.7 lbs   TTE 01/05/23 with EF 50-55% mild MR normal TAVR valve  Admitted 03/04/23 and early January with dyspnea ? Diastolic CHF Rx with lasix  BNP only 144 troponin negative CXR CE cephalization. Echo not repeated D/C weight 106 kg No recurrent PAF Lasix  40 mg daily K 4.1 BUN 17 Cr 1.32   Breathing at baseline since d/c. Lives alone but has neighbors and 4 children that look in on him Hygiene needs to improve  and discussed clipping his nails  Echo done 04/21/23 EF 45% mild MS mean diastolic gradient only 4 mmHg Normal TAVR valve 23 mm Sapien mean gradient 13.8 Carotid 03/19/23:  no obstructive dx  No complaints or changes since last visti      Studies Reviewed:   EKG:  08/20/22 NSR rate 64 chronic LBBB    Event Monitor: 04/2022 Patch Wear Time:  13 days and 23 hours (2024-03-14T17:42:16-0400 to 2024-03-28T17:32:36-0400)   Patient had a min HR of 51 bpm, max HR of 184 bpm, and avg HR of 71 bpm. Predominant underlying rhythm was Sinus Rhythm. Bundle Branch Block/IVCD was present. QRS morphology changes were present throughout recording. 1 run of Ventricular Tachycardia  occurred lasting 4 beats with a max rate of 184 bpm (avg 158 bpm). 1 run of Supraventricular Tachycardia occurred lasting 9 beats with a max rate of 102 bpm (avg 93 bpm). Atrial Fibrillation occurred (1% burden), ranging from 68-153 bpm (avg of 98 bpm),  the longest lasting 5 hours 0 mins with an avg rate of 98 bpm. Isolated SVEs were rare (<1.0%), SVE Couplets were rare (<1.0%), and SVE Triplets were rare (<1.0%). Isolated VEs were occasional (1.9%, 26299), VE Couplets were rare (<1.0%, 388), and VE  Triplets were rare (<1.0%, 3). Ventricular Bigeminy and Trigeminy were present.  Echocardiogram 04/21/23   1. Left ventricular ejection fraction, by estimation,  is 45%. The left  ventricle has mildly decreased function. The left ventricle demonstrates  regional wall motion. Although the LV endocardium is not well visualized,  entire septum and inferior wall  appears to be hypokinetic. Consider Limited Echo with Definity for  evaluation of RWMA. Left ventricular diastolic parameters are consistent  with Grade I diastolic dysfunction (impaired relaxation). Elevated left  ventricular end-diastolic pressure.   2. Right ventricular systolic function is normal. The right ventricular  size is normal.   3. The mitral valve is abnormal.  Trivial mitral valve regurgitation. Mild  mitral stenosis. The mean mitral valve gradient is 4.0 mmHg. Severe mitral  annular calcification.   4. The aortic valve has been repaired/replaced. There is mild aortic  valve regurgitation. Prior echo images demonstrated trivial aortic  regurgitation on A4C view. Unclear if any paravalvular leak due to poor  echo images. No aortic stenosis is present.  There is a 23 mm Sapien 3 stented TAVR valve present in the aortic  position. Aortic valve mean gradient measures 13.8 mmHg, decreased from  prior Echo where the mean PG was between 17 and 19 mm Hg. Low mean PG  likely from ?new LV dysfunction.   5. The inferior vena cava is normal in size with greater than 50%  respiratory variability, suggesting right atrial pressure of 3 mmHg.   Comparison(s): Prior images reviewed side by side. Changes from prior  study are noted. Changes as above.    Physical Exam:   VS:  BP 118/66   Pulse 69   Ht 5' 9 (1.753 m)   Wt 225 lb 9.6 oz (102.3 kg)   SpO2 91%   BMI 33.32 kg/m    Wt Readings from Last 3 Encounters:  01/11/24 225 lb 9.6 oz (102.3 kg)  10/30/23 236 lb 3.2 oz (107.1 kg)  10/23/23 236 lb 3.2 oz (107.1 kg)     Affect appropriate Elderly male  HEENT: normal Neck supple with no adenopathy JVP normal left  bruits no thyromegaly Lungs clear with no wheezing and good diaphragmatic motion Heart:  S1/S2 SEM through TAVR valve no AR  murmur, no rub, gallop or click PMI normal Abdomen: benighn, BS positve, no tenderness, no AAA no bruit.  No HSM or HJR Distal pulses intact with no bruits Plus one bilateral edema Neuro non-focal Skin warm and dry No muscular weakness    Assessment and Plan:   1. Paroxysmal Atrial Fibrillation - Maintaining NSR continue  Lopressor  37.5 mg twice daily. We reviewed that if he continues to have recurrent arrhythmias, would refer to EP. - Continue Eliquis  5 mg twice daily for anticoagulation which is the  appropriate dose at this time given his age, weight and renal function. CBC last month showed that his hemoglobin was stable at 14.7 with platelets at 140 K.   2. CAD - He underwent stenting to the distal RCA in 08/2013 and DES to the LAD in 07/2021.  He remains active at baseline and denies any recent anginal symptoms. He is no longer on ASA given the need for anticoagulation. Continue Lopressor  37.5 mg twice daily and Crestor  20 mg daily.    3. Aortic Stenosis - He did undergo TAVR in 09/2021 and recent echocardiogram 04/21/23 showed his TAVR valve was functioning normally with a mean gradient of 13.8 mmHg. No PVL  4. HTN - BP is well-controlled at 124/66 during today's visit. Continue current medical therapy with Amlodipine  5 mg daily, Lisinopril  5 mg daily and Lopressor  37.5 mg  twice daily.  5. HLD - LDL was 60 in 02/2022. Continue Crestor  20 mg daily.  6. Stage 3 CKD - Cr 1.5 f/u nephrology    7. BPH:   -sees McKenzie on finasteride  and rapaflo    8.  CHF:  stable EF 45% normal TAVR valve maintaining NSR  Continue lisinopril , lopressor  and lasix  as well as Farxiga   9.  Carotid Bruit:  left sided duplex 03/19/23 no obstructive dx    F/U in 6 months   Signed, Maude Emmer, MD   "

## 2024-01-04 ENCOUNTER — Other Ambulatory Visit: Payer: Self-pay

## 2024-01-04 ENCOUNTER — Other Ambulatory Visit: Payer: Self-pay | Admitting: Cardiovascular Disease

## 2024-01-04 MED ORDER — FUROSEMIDE 40 MG PO TABS: ORAL_TABLET | ORAL | 3 refills | Status: AC

## 2024-01-10 ENCOUNTER — Other Ambulatory Visit: Payer: Self-pay | Admitting: Urology

## 2024-01-10 DIAGNOSIS — N400 Enlarged prostate without lower urinary tract symptoms: Secondary | ICD-10-CM

## 2024-01-11 ENCOUNTER — Encounter: Payer: Self-pay | Admitting: Cardiovascular Disease

## 2024-01-11 ENCOUNTER — Ambulatory Visit: Attending: Cardiovascular Disease | Admitting: Cardiovascular Disease

## 2024-01-11 VITALS — BP 118/66 | HR 69 | Ht 69.0 in | Wt 225.6 lb

## 2024-01-11 DIAGNOSIS — I480 Paroxysmal atrial fibrillation: Secondary | ICD-10-CM

## 2024-01-11 DIAGNOSIS — Z952 Presence of prosthetic heart valve: Secondary | ICD-10-CM | POA: Diagnosis not present

## 2024-01-11 DIAGNOSIS — E785 Hyperlipidemia, unspecified: Secondary | ICD-10-CM

## 2024-01-11 DIAGNOSIS — R0989 Other specified symptoms and signs involving the circulatory and respiratory systems: Secondary | ICD-10-CM | POA: Diagnosis not present

## 2024-01-11 DIAGNOSIS — I5022 Chronic systolic (congestive) heart failure: Secondary | ICD-10-CM | POA: Diagnosis not present

## 2024-01-11 NOTE — Patient Instructions (Signed)
Medication Instructions:  Your physician recommends that you continue on your current medications as directed. Please refer to the Current Medication list given to you today.   Labwork: None today  Testing/Procedures: None today  Follow-Up: 1 year Dr.Nishan  Any Other Special Instructions Will Be Listed Below (If Applicable).  If you need a refill on your cardiac medications before your next appointment, please call your pharmacy.  

## 2024-02-09 ENCOUNTER — Other Ambulatory Visit: Payer: Self-pay | Admitting: Urology

## 2024-02-09 DIAGNOSIS — N400 Enlarged prostate without lower urinary tract symptoms: Secondary | ICD-10-CM

## 2024-03-09 ENCOUNTER — Ambulatory Visit: Admitting: Urology
